# Patient Record
Sex: Female | Born: 1945
Health system: Southern US, Community
[De-identification: ages and names within clinical notes are randomized; demographics above are authoritative.]

## PROBLEM LIST (undated history)

## (undated) DIAGNOSIS — I2510 Atherosclerotic heart disease of native coronary artery without angina pectoris: Secondary | ICD-10-CM

## (undated) DIAGNOSIS — J45909 Unspecified asthma, uncomplicated: Secondary | ICD-10-CM

## (undated) DIAGNOSIS — Z8719 Personal history of other diseases of the digestive system: Secondary | ICD-10-CM

## (undated) DIAGNOSIS — I340 Nonrheumatic mitral (valve) insufficiency: Secondary | ICD-10-CM

## (undated) DIAGNOSIS — I5032 Chronic diastolic (congestive) heart failure: Secondary | ICD-10-CM

## (undated) DIAGNOSIS — R7303 Prediabetes: Secondary | ICD-10-CM

## (undated) DIAGNOSIS — Z9889 Other specified postprocedural states: Secondary | ICD-10-CM

## (undated) DIAGNOSIS — I499 Cardiac arrhythmia, unspecified: Secondary | ICD-10-CM

## (undated) DIAGNOSIS — R519 Headache, unspecified: Secondary | ICD-10-CM

## (undated) DIAGNOSIS — K219 Gastro-esophageal reflux disease without esophagitis: Secondary | ICD-10-CM

## (undated) DIAGNOSIS — E039 Hypothyroidism, unspecified: Secondary | ICD-10-CM

## (undated) DIAGNOSIS — Z950 Presence of cardiac pacemaker: Secondary | ICD-10-CM

## (undated) DIAGNOSIS — F32A Depression, unspecified: Secondary | ICD-10-CM

## (undated) DIAGNOSIS — I480 Paroxysmal atrial fibrillation: Secondary | ICD-10-CM

## (undated) DIAGNOSIS — I10 Essential (primary) hypertension: Secondary | ICD-10-CM

## (undated) DIAGNOSIS — I350 Nonrheumatic aortic (valve) stenosis: Secondary | ICD-10-CM

## (undated) DIAGNOSIS — E785 Hyperlipidemia, unspecified: Secondary | ICD-10-CM

## (undated) DIAGNOSIS — N189 Chronic kidney disease, unspecified: Secondary | ICD-10-CM

## (undated) DIAGNOSIS — R011 Cardiac murmur, unspecified: Secondary | ICD-10-CM

## (undated) DIAGNOSIS — M1990 Unspecified osteoarthritis, unspecified site: Secondary | ICD-10-CM

## (undated) DIAGNOSIS — R112 Nausea with vomiting, unspecified: Secondary | ICD-10-CM

## (undated) DIAGNOSIS — Z87442 Personal history of urinary calculi: Secondary | ICD-10-CM

## (undated) DIAGNOSIS — E0590 Thyrotoxicosis, unspecified without thyrotoxic crisis or storm: Secondary | ICD-10-CM

## (undated) DIAGNOSIS — F419 Anxiety disorder, unspecified: Secondary | ICD-10-CM

## (undated) DIAGNOSIS — G4730 Sleep apnea, unspecified: Secondary | ICD-10-CM

## (undated) DIAGNOSIS — D649 Anemia, unspecified: Secondary | ICD-10-CM

## (undated) DIAGNOSIS — J189 Pneumonia, unspecified organism: Secondary | ICD-10-CM

## (undated) DIAGNOSIS — T8859XA Other complications of anesthesia, initial encounter: Secondary | ICD-10-CM

## (undated) HISTORY — DX: Thyrotoxicosis, unspecified without thyrotoxic crisis or storm: E05.90

## (undated) HISTORY — DX: Paroxysmal atrial fibrillation: I48.0

## (undated) HISTORY — DX: Chronic kidney disease, unspecified: N18.9

## (undated) HISTORY — DX: Gastro-esophageal reflux disease without esophagitis: K21.9

## (undated) HISTORY — PX: CARDIAC CATHETERIZATION: SHX172

## (undated) HISTORY — DX: Cardiac murmur, unspecified: R01.1

## (undated) HISTORY — DX: Essential (primary) hypertension: I10

## (undated) HISTORY — PX: SHOULDER SURGERY: SHX246

## (undated) HISTORY — DX: Hyperlipidemia, unspecified: E78.5

## (undated) HISTORY — DX: Atherosclerotic heart disease of native coronary artery without angina pectoris: I25.10

---

## 1978-09-11 HISTORY — PX: ABDOMINAL HYSTERECTOMY: SHX81

## 1996-09-11 HISTORY — PX: VESICOVAGINAL FISTULA CLOSURE W/ TAH: SUR271

## 1999-09-12 HISTORY — PX: GALLBLADDER SURGERY: SHX652

## 1999-11-07 ENCOUNTER — Observation Stay (HOSPITAL_COMMUNITY): Admission: RE | Admit: 1999-11-07 | Discharge: 1999-11-08 | Payer: Self-pay | Admitting: *Deleted

## 1999-11-07 ENCOUNTER — Encounter (INDEPENDENT_AMBULATORY_CARE_PROVIDER_SITE_OTHER): Payer: Self-pay | Admitting: Specialist

## 2004-01-24 ENCOUNTER — Emergency Department (HOSPITAL_COMMUNITY): Admission: AD | Admit: 2004-01-24 | Discharge: 2004-01-24 | Payer: Self-pay | Admitting: *Deleted

## 2004-02-29 ENCOUNTER — Emergency Department (HOSPITAL_COMMUNITY): Admission: EM | Admit: 2004-02-29 | Discharge: 2004-02-29 | Payer: Self-pay | Admitting: Family Medicine

## 2004-09-30 ENCOUNTER — Ambulatory Visit: Payer: Self-pay | Admitting: Pulmonary Disease

## 2004-10-18 ENCOUNTER — Ambulatory Visit: Payer: Self-pay | Admitting: Pulmonary Disease

## 2005-04-18 ENCOUNTER — Ambulatory Visit (HOSPITAL_COMMUNITY): Admission: RE | Admit: 2005-04-18 | Discharge: 2005-04-18 | Payer: Self-pay | Admitting: Gastroenterology

## 2006-01-07 ENCOUNTER — Emergency Department (HOSPITAL_COMMUNITY): Admission: EM | Admit: 2006-01-07 | Discharge: 2006-01-07 | Payer: Self-pay | Admitting: Emergency Medicine

## 2006-10-02 ENCOUNTER — Emergency Department (HOSPITAL_COMMUNITY): Admission: EM | Admit: 2006-10-02 | Discharge: 2006-10-02 | Payer: Self-pay | Admitting: Emergency Medicine

## 2009-07-27 ENCOUNTER — Inpatient Hospital Stay (HOSPITAL_BASED_OUTPATIENT_CLINIC_OR_DEPARTMENT_OTHER): Admission: RE | Admit: 2009-07-27 | Discharge: 2009-07-27 | Payer: Self-pay | Admitting: Cardiovascular Disease

## 2009-07-28 ENCOUNTER — Observation Stay (HOSPITAL_COMMUNITY): Admission: RE | Admit: 2009-07-28 | Discharge: 2009-07-29 | Payer: Self-pay | Admitting: Cardiovascular Disease

## 2009-07-28 ENCOUNTER — Encounter: Payer: Self-pay | Admitting: Thoracic Surgery (Cardiothoracic Vascular Surgery)

## 2009-07-28 ENCOUNTER — Ambulatory Visit: Payer: Self-pay | Admitting: Thoracic Surgery (Cardiothoracic Vascular Surgery)

## 2009-07-29 ENCOUNTER — Encounter: Payer: Self-pay | Admitting: Thoracic Surgery (Cardiothoracic Vascular Surgery)

## 2009-08-02 ENCOUNTER — Ambulatory Visit: Payer: Self-pay | Admitting: Thoracic Surgery (Cardiothoracic Vascular Surgery)

## 2009-08-03 ENCOUNTER — Inpatient Hospital Stay (HOSPITAL_COMMUNITY)
Admission: RE | Admit: 2009-08-03 | Discharge: 2009-08-09 | Payer: Self-pay | Admitting: Thoracic Surgery (Cardiothoracic Vascular Surgery)

## 2009-08-03 HISTORY — PX: CORONARY ARTERY BYPASS GRAFT: SHX141

## 2009-08-30 ENCOUNTER — Ambulatory Visit: Payer: Self-pay | Admitting: Thoracic Surgery (Cardiothoracic Vascular Surgery)

## 2009-08-30 ENCOUNTER — Encounter
Admission: RE | Admit: 2009-08-30 | Discharge: 2009-08-30 | Payer: Self-pay | Admitting: Thoracic Surgery (Cardiothoracic Vascular Surgery)

## 2009-09-02 ENCOUNTER — Observation Stay (HOSPITAL_COMMUNITY): Admission: EM | Admit: 2009-09-02 | Discharge: 2009-09-03 | Payer: Self-pay | Admitting: Emergency Medicine

## 2009-09-13 ENCOUNTER — Encounter (HOSPITAL_COMMUNITY): Admission: RE | Admit: 2009-09-13 | Discharge: 2009-12-12 | Payer: Self-pay | Admitting: Cardiovascular Disease

## 2009-09-13 ENCOUNTER — Ambulatory Visit: Payer: Self-pay | Admitting: Thoracic Surgery (Cardiothoracic Vascular Surgery)

## 2009-09-13 ENCOUNTER — Encounter
Admission: RE | Admit: 2009-09-13 | Discharge: 2009-09-13 | Payer: Self-pay | Admitting: Thoracic Surgery (Cardiothoracic Vascular Surgery)

## 2009-10-07 ENCOUNTER — Ambulatory Visit: Payer: Self-pay | Admitting: Thoracic Surgery (Cardiothoracic Vascular Surgery)

## 2009-11-16 ENCOUNTER — Ambulatory Visit: Payer: Self-pay | Admitting: Thoracic Surgery (Cardiothoracic Vascular Surgery)

## 2009-11-16 ENCOUNTER — Encounter
Admission: RE | Admit: 2009-11-16 | Discharge: 2009-11-16 | Payer: Self-pay | Admitting: Thoracic Surgery (Cardiothoracic Vascular Surgery)

## 2010-09-20 ENCOUNTER — Encounter: Payer: Self-pay | Admitting: Cardiovascular Disease

## 2010-09-22 ENCOUNTER — Encounter
Admission: RE | Admit: 2010-09-22 | Discharge: 2010-09-22 | Payer: Self-pay | Source: Home / Self Care | Attending: Cardiovascular Disease | Admitting: Cardiovascular Disease

## 2010-09-23 ENCOUNTER — Ambulatory Visit: Payer: Self-pay | Admitting: Cardiovascular Disease

## 2010-09-26 ENCOUNTER — Ambulatory Visit: Admit: 2010-09-26 | Payer: Self-pay | Admitting: Thoracic Surgery (Cardiothoracic Vascular Surgery)

## 2010-10-02 ENCOUNTER — Encounter: Payer: Self-pay | Admitting: Thoracic Surgery (Cardiothoracic Vascular Surgery)

## 2010-12-12 LAB — CBC
HCT: 33.8 % — ABNORMAL LOW (ref 36.0–46.0)
Hemoglobin: 11.6 g/dL — ABNORMAL LOW (ref 12.0–15.0)
MCHC: 34.3 g/dL (ref 30.0–36.0)
MCV: 88.6 fL (ref 78.0–100.0)
Platelets: 222 10*3/uL (ref 150–400)
RBC: 3.81 MIL/uL — ABNORMAL LOW (ref 3.87–5.11)
RDW: 13.2 % (ref 11.5–15.5)
WBC: 9.6 10*3/uL (ref 4.0–10.5)

## 2010-12-12 LAB — URINE CULTURE: Colony Count: 75000

## 2010-12-12 LAB — COMPREHENSIVE METABOLIC PANEL WITH GFR
ALT: 24 U/L (ref 0–35)
AST: 24 U/L (ref 0–37)
Calcium: 8.7 mg/dL (ref 8.4–10.5)
Glucose, Bld: 98 mg/dL (ref 70–99)
Sodium: 137 meq/L (ref 135–145)
Total Protein: 6.9 g/dL (ref 6.0–8.3)

## 2010-12-12 LAB — URINALYSIS, ROUTINE W REFLEX MICROSCOPIC
Bilirubin Urine: NEGATIVE
Glucose, UA: NEGATIVE mg/dL
Hgb urine dipstick: NEGATIVE
Ketones, ur: 40 mg/dL — AB
Nitrite: NEGATIVE
Protein, ur: NEGATIVE mg/dL
Specific Gravity, Urine: 1.023 (ref 1.005–1.030)
Urobilinogen, UA: 0.2 mg/dL (ref 0.0–1.0)
pH: 5 (ref 5.0–8.0)

## 2010-12-12 LAB — URINE MICROSCOPIC-ADD ON

## 2010-12-12 LAB — DIFFERENTIAL
Basophils Absolute: 0 K/uL (ref 0.0–0.1)
Basophils Relative: 0 % (ref 0–1)
Eosinophils Absolute: 0 K/uL (ref 0.0–0.7)
Eosinophils Relative: 0 % (ref 0–5)
Lymphocytes Relative: 5 % — ABNORMAL LOW (ref 12–46)
Lymphs Abs: 0.5 K/uL — ABNORMAL LOW (ref 0.7–4.0)
Monocytes Absolute: 0.2 K/uL (ref 0.1–1.0)
Monocytes Relative: 2 % — ABNORMAL LOW (ref 3–12)
Neutro Abs: 8.9 K/uL — ABNORMAL HIGH (ref 1.7–7.7)
Neutrophils Relative %: 92 % — ABNORMAL HIGH (ref 43–77)

## 2010-12-12 LAB — COMPREHENSIVE METABOLIC PANEL
Albumin: 3.3 g/dL — ABNORMAL LOW (ref 3.5–5.2)
Alkaline Phosphatase: 60 U/L (ref 39–117)
BUN: 19 mg/dL (ref 6–23)
CO2: 25 mEq/L (ref 19–32)
Chloride: 105 mEq/L (ref 96–112)
Creatinine, Ser: 0.9 mg/dL (ref 0.4–1.2)
GFR calc Af Amer: >60 mL/min (ref >60)
GFR calc non Af Amer: >60 mL/min (ref >60)
Potassium: 4 mEq/L (ref 3.5–5.1)
Total Bilirubin: 0.6 mg/dL (ref 0.3–1.2)

## 2010-12-14 LAB — URINALYSIS, MICROSCOPIC ONLY
Bilirubin Urine: NEGATIVE
Leukocytes, UA: NEGATIVE
Nitrite: NEGATIVE
Protein, ur: NEGATIVE mg/dL
Specific Gravity, Urine: 1.012 (ref 1.005–1.030)

## 2010-12-14 LAB — POCT I-STAT 3, ART BLOOD GAS (G3+)
Acid-base deficit: 1 mmol/L (ref 0.0–2.0)
Acid-base deficit: 3 mmol/L — ABNORMAL HIGH (ref 0.0–2.0)
Bicarbonate: 20.5 mEq/L (ref 20.0–24.0)
Bicarbonate: 20.8 mEq/L (ref 20.0–24.0)
O2 Saturation: 96 %
Patient temperature: 33.9
Patient temperature: 35
Patient temperature: 36.9
TCO2: 21 mmol/L (ref 0–100)
TCO2: 22 mmol/L (ref 0–100)
pCO2 arterial: 35 mmHg (ref 35.0–45.0)
pH, Arterial: 7.382 (ref 7.350–7.400)
pO2, Arterial: 461 mmHg — ABNORMAL HIGH (ref 80.0–100.0)
pO2, Arterial: 62 mmHg — ABNORMAL LOW (ref 80.0–100.0)

## 2010-12-14 LAB — POCT I-STAT 4, (NA,K, GLUC, HGB,HCT)
Glucose, Bld: 91 mg/dL (ref 70–99)
Glucose, Bld: 93 mg/dL (ref 70–99)
HCT: 31 % — ABNORMAL LOW (ref 36.0–46.0)
HCT: 34 % — ABNORMAL LOW (ref 36.0–46.0)
Hemoglobin: 10.5 g/dL — ABNORMAL LOW (ref 12.0–15.0)
Potassium: 3.7 mEq/L (ref 3.5–5.1)
Potassium: 3.9 mEq/L (ref 3.5–5.1)
Potassium: 4 mEq/L (ref 3.5–5.1)
Sodium: 136 mEq/L (ref 135–145)
Sodium: 138 mEq/L (ref 135–145)
Sodium: 138 mEq/L (ref 135–145)

## 2010-12-14 LAB — POCT I-STAT, CHEM 8
BUN: 17 mg/dL (ref 6–23)
Creatinine, Ser: 0.8 mg/dL (ref 0.4–1.2)
Hemoglobin: 12.6 g/dL (ref 12.0–15.0)
Potassium: 4.8 mEq/L (ref 3.5–5.1)
Sodium: 136 mEq/L (ref 135–145)
TCO2: 20 mmol/L (ref 0–100)

## 2010-12-14 LAB — URINALYSIS, ROUTINE W REFLEX MICROSCOPIC
Glucose, UA: NEGATIVE mg/dL
Hgb urine dipstick: NEGATIVE
Ketones, ur: NEGATIVE mg/dL
Protein, ur: NEGATIVE mg/dL

## 2010-12-14 LAB — CBC
HCT: 29.9 % — ABNORMAL LOW (ref 36.0–46.0)
HCT: 30.9 % — ABNORMAL LOW (ref 36.0–46.0)
HCT: 32.5 % — ABNORMAL LOW (ref 36.0–46.0)
HCT: 33.1 % — ABNORMAL LOW (ref 36.0–46.0)
HCT: 38.3 % (ref 36.0–46.0)
HCT: 44.1 % (ref 36.0–46.0)
Hemoglobin: 10.4 g/dL — ABNORMAL LOW (ref 12.0–15.0)
Hemoglobin: 11.1 g/dL — ABNORMAL LOW (ref 12.0–15.0)
Hemoglobin: 11.4 g/dL — ABNORMAL LOW (ref 12.0–15.0)
Hemoglobin: 12.2 g/dL (ref 12.0–15.0)
MCHC: 34.1 g/dL (ref 30.0–36.0)
MCHC: 34.5 g/dL (ref 30.0–36.0)
MCHC: 34.9 g/dL (ref 30.0–36.0)
MCHC: 35 g/dL (ref 30.0–36.0)
MCV: 87.9 fL (ref 78.0–100.0)
MCV: 88.1 fL (ref 78.0–100.0)
MCV: 88.9 fL (ref 78.0–100.0)
MCV: 89.6 fL (ref 78.0–100.0)
MCV: 90.3 fL (ref 78.0–100.0)
MCV: 90.4 fL (ref 78.0–100.0)
Platelets: 147 10*3/uL — ABNORMAL LOW (ref 150–400)
Platelets: 155 10*3/uL (ref 150–400)
Platelets: 268 10*3/uL (ref 150–400)
RBC: 3.44 MIL/uL — ABNORMAL LOW (ref 3.87–5.11)
RBC: 3.95 MIL/uL (ref 3.87–5.11)
RBC: 4.26 MIL/uL (ref 3.87–5.11)
RDW: 12.9 % (ref 11.5–15.5)
RDW: 12.9 % (ref 11.5–15.5)
RDW: 12.9 % (ref 11.5–15.5)
RDW: 13.2 % (ref 11.5–15.5)
RDW: 13.4 % (ref 11.5–15.5)
RDW: 13.4 % (ref 11.5–15.5)
WBC: 21.2 10*3/uL — ABNORMAL HIGH (ref 4.0–10.5)
WBC: 8.4 10*3/uL (ref 4.0–10.5)
WBC: 9.6 10*3/uL (ref 4.0–10.5)
WBC: 9.7 10*3/uL (ref 4.0–10.5)

## 2010-12-14 LAB — BASIC METABOLIC PANEL
BUN: 17 mg/dL (ref 6–23)
CO2: 21 mEq/L (ref 19–32)
CO2: 23 mEq/L (ref 19–32)
Calcium: 8.1 mg/dL — ABNORMAL LOW (ref 8.4–10.5)
Calcium: 8.4 mg/dL (ref 8.4–10.5)
Chloride: 104 mEq/L (ref 96–112)
Chloride: 106 mEq/L (ref 96–112)
Chloride: 109 mEq/L (ref 96–112)
Creatinine, Ser: 0.82 mg/dL (ref 0.4–1.2)
Creatinine, Ser: 0.97 mg/dL (ref 0.4–1.2)
GFR calc non Af Amer: 52 mL/min — ABNORMAL LOW (ref >60)
Glucose, Bld: 116 mg/dL — ABNORMAL HIGH (ref 70–99)
Glucose, Bld: 84 mg/dL (ref 70–99)
Glucose, Bld: 98 mg/dL (ref 70–99)
Potassium: 3.6 mEq/L (ref 3.5–5.1)
Potassium: 4.3 mEq/L (ref 3.5–5.1)
Potassium: 4.7 mEq/L (ref 3.5–5.1)
Sodium: 132 mEq/L — ABNORMAL LOW (ref 135–145)
Sodium: 139 mEq/L (ref 135–145)
Sodium: 139 mEq/L (ref 135–145)

## 2010-12-14 LAB — GLUCOSE, CAPILLARY
Glucose-Capillary: 104 mg/dL — ABNORMAL HIGH (ref 70–99)
Glucose-Capillary: 109 mg/dL — ABNORMAL HIGH (ref 70–99)
Glucose-Capillary: 117 mg/dL — ABNORMAL HIGH (ref 70–99)
Glucose-Capillary: 90 mg/dL (ref 70–99)

## 2010-12-14 LAB — BLOOD GAS, ARTERIAL
Acid-base deficit: 0.7 mmol/L (ref 0.0–2.0)
Drawn by: 317771
O2 Saturation: 95.6 %
Patient temperature: 98.6
pH, Arterial: 7.406 — ABNORMAL HIGH (ref 7.350–7.400)

## 2010-12-14 LAB — LIPID PANEL
HDL: 48 mg/dL (ref >39)
Total CHOL/HDL Ratio: 2.8 RATIO
VLDL: 8 mg/dL (ref 0–40)

## 2010-12-14 LAB — PROTIME-INR
INR: 1.12 (ref 0.00–1.49)
INR: 1.4 (ref 0.00–1.49)
Prothrombin Time: 17 seconds — ABNORMAL HIGH (ref 11.6–15.2)

## 2010-12-14 LAB — COMPREHENSIVE METABOLIC PANEL
ALT: 43 U/L — ABNORMAL HIGH (ref 0–35)
AST: 36 U/L (ref 0–37)
CO2: 29 mEq/L (ref 19–32)
Chloride: 103 mEq/L (ref 96–112)
GFR calc Af Amer: >60 mL/min (ref >60)
GFR calc non Af Amer: 57 mL/min — ABNORMAL LOW (ref >60)
Potassium: 4.9 mEq/L (ref 3.5–5.1)
Sodium: 138 mEq/L (ref 135–145)
Total Bilirubin: 0.7 mg/dL (ref 0.3–1.2)

## 2010-12-14 LAB — APTT
aPTT: 25 seconds (ref 24–37)
aPTT: 27 seconds (ref 24–37)

## 2010-12-14 LAB — CREATININE, SERUM
GFR calc Af Amer: >60 mL/min (ref >60)
GFR calc non Af Amer: 52 mL/min — ABNORMAL LOW (ref >60)

## 2010-12-14 LAB — MAGNESIUM
Magnesium: 2.4 mg/dL (ref 1.5–2.5)
Magnesium: 2.7 mg/dL — ABNORMAL HIGH (ref 1.5–2.5)

## 2010-12-14 LAB — ABO/RH: ABO/RH(D): A POS

## 2010-12-26 ENCOUNTER — Other Ambulatory Visit: Payer: Self-pay | Admitting: *Deleted

## 2010-12-26 DIAGNOSIS — I10 Essential (primary) hypertension: Secondary | ICD-10-CM

## 2010-12-26 MED ORDER — AMLODIPINE BESYLATE 5 MG PO TABS: 5.0000 mg | ORAL_TABLET | Freq: Every day | ORAL | Status: DC

## 2010-12-29 ENCOUNTER — Encounter: Payer: Self-pay | Admitting: *Deleted

## 2010-12-29 DIAGNOSIS — R011 Cardiac murmur, unspecified: Secondary | ICD-10-CM

## 2010-12-29 DIAGNOSIS — I10 Essential (primary) hypertension: Secondary | ICD-10-CM | POA: Insufficient documentation

## 2010-12-29 DIAGNOSIS — E785 Hyperlipidemia, unspecified: Secondary | ICD-10-CM | POA: Insufficient documentation

## 2010-12-29 DIAGNOSIS — I2510 Atherosclerotic heart disease of native coronary artery without angina pectoris: Secondary | ICD-10-CM | POA: Insufficient documentation

## 2010-12-29 DIAGNOSIS — I25119 Atherosclerotic heart disease of native coronary artery with unspecified angina pectoris: Secondary | ICD-10-CM | POA: Insufficient documentation

## 2010-12-30 ENCOUNTER — Encounter: Payer: Self-pay | Admitting: Cardiovascular Disease

## 2010-12-30 ENCOUNTER — Ambulatory Visit (INDEPENDENT_AMBULATORY_CARE_PROVIDER_SITE_OTHER): Payer: PRIVATE HEALTH INSURANCE | Admitting: Cardiovascular Disease

## 2010-12-30 DIAGNOSIS — E785 Hyperlipidemia, unspecified: Secondary | ICD-10-CM

## 2010-12-30 DIAGNOSIS — I2510 Atherosclerotic heart disease of native coronary artery without angina pectoris: Secondary | ICD-10-CM

## 2010-12-30 MED ORDER — ATORVASTATIN CALCIUM 40 MG PO TABS: 40.0000 mg | ORAL_TABLET | Freq: Every day | ORAL | Status: DC

## 2010-12-30 NOTE — Assessment & Plan Note (Signed)
Samantha Short presents for followup of her coronary artery disease. She has lots of a complaint such as some vague chest pain and a shortness breath. These episodes of chest pain June not so much her presenting episodes of angina. She refused to have an EKG today. I've asked her to continue with the diet and exercise program.

## 2010-12-30 NOTE — Patient Instructions (Signed)
Lipid profile and liver enzymes in 3 months. OV in 6 months.

## 2010-12-30 NOTE — Assessment & Plan Note (Signed)
The patient is to Crestor the past but did not tolerate it. She has a history of premature coronary artery disease and still has an elevated LDL. I've encouraged her to try Lipitor 40 mg a day. We'll start that today I'll check a lipid profile, basic metabolic profile, and hepatic profile in 3 months.

## 2010-12-30 NOTE — Progress Notes (Signed)
Samantha Short Date of Birth  Nov 18, 1945 [facility] [address]     [address] [place], [place]  [address] [phone]  Fax  [phone]  History of Present Illness:  Samantha Short presents today for followup of her coronary artery disease.  She's had some vague episodes of chest pain although they are not similar to her presenting episodes of angina years ago. Has shortness breath everyday. She denies any syncope or presyncope.    Current Outpatient Prescriptions on File Prior to Visit  Medication Sig Dispense Refill  . amLODipine (NORVASC) 5 MG tablet Take 1 tablet (5 mg total) by mouth daily.  90 tablet  3  . aspirin 325 MG tablet Take 325 mg by mouth daily.        Marland Kitchen lisinopril (PRINIVIL,ZESTRIL) 10 MG tablet Take 10 mg by mouth daily.        . metoprolol (LOPRESSOR) 50 MG tablet Take 75 mg by mouth. 1 & 1/2 tablet 2 (two) times daily         Allergies  Allergen Reactions  . Ceclor (Cefaclor)   . Crestor (Rosuvastatin Calcium)   . Erythromycin   . Penicillins   . Sulfur   . Tetracyclines & Related     Past Medical History  Diagnosis Date  . Coronary artery disease   . Hyperlipidemia   . Hypertension   . Heart murmur     very soft heart murmur  . History of pneumonia     x3  . Acid reflux   . History of shortness of breath   . History of rheumatic fever     was pregnant with 1st child / child died at birth      . Dyslipidemia   . History of echocardiogram 07/29/2009    Past Surgical History  Procedure Date  . Cardiac catheterization 07/27/09 & 07/28/09  . Cesarean section   . Cesarean section   . Vesicovaginal fistula closure w/ tah 1998  . Gallbladder surgery 2001  . Shoulder surgery 1998 / 2001    from accident  . Coronary artery bypass graft 08/03/2009    x1 using left internal mammary artery to distal left anterior  descending coronary artery.     History  Smoking status  . Former Smoker -- 7 years  . Types:  Cigarettes  . Quit date: 07/12/2010  Smokeless tobacco  . Never Used    History  Alcohol Use No    Family History  Problem Relation Age of Onset  . Cancer Father   . Heart attack Father   . Heart attack Mother   . Heart disease Mother     had pacemaker  . Cancer Brother   . Heart disease Brother   . Heart disease Sister   . Cancer Sister   . Angina Sister   . Coronary artery disease Son   . Hypertension Sister   . Hypertension Brother     Reviw of Systems:  Reviewed in the HPI.  All other systems are negative.  Physical Exam: BP 148/82  Pulse 72  Ht 5\' 4"  (1.626 m)  Wt 204 lb 9.6 oz (92.806 kg)  BMI 35.12 kg/m2 The patient is alert and oriented x 3.  The mood and affect are normal.  The skin is warm and dry.  Color is normal.  The HEENT exam reveals that the sclera are nonicteric.  The mucous membranes are moist.  The carotids are 2+ without bruits.  There is no thyromegaly.  There is no JVD.  The lungs are clear.  The chest wall is non tender.  The heart exam reveals a regular rate with a normal S1 and S2.  There are no murmurs, gallops, or rubs.  The PMI is not displaced.   Abdominal exam reveals good bowel sounds.  There is no guarding or rebound.  There is no hepatosplenomegaly or tenderness.  There are no masses.  Exam of the legs reveal no clubbing, cyanosis, or edema.  The legs are without rashes.  The distal pulses are intact.  Cranial nerves II - XII are intact.  Motor and sensory functions are intact.  The gait is normal.  ECG:  Assessment / Plan:

## 2011-01-24 NOTE — Assessment & Plan Note (Signed)
OFFICE VISIT   Samantha Short, Samantha Short  DOB:  Jan 09, 1946                                        August 30, 2009  CHART #:  [id number]   REASON FOR OFFICE VISIT:  Routine followup status post CABG x1, off-  pump.   HISTORY OF PRESENT ILLNESS:  This is a 65 year old Caucasian female with  a past medical history of hypertension and tobacco abuse who had a  Cardiolite stress test that was abnormal.  She underwent an elective  cardiac catheterization on July 28, 2009 by Dr. Elease Hashimoto.  She was  found to have a 90% stenosis of the mid LAD with normal left ventricular  function.  She had been given a loading dose of Plavix and was brought  back to the cardiac catheterization lab for a possible percutaneous  coronary intervention.  However, left main coronary artery cannot be  safely engaged and the procedure was boarded.  Because the patient had  received the full loading dose of Plavix, it was decided the patient  will be discharged on July 29, 2009.  She then re-presented to [facility] on August 03, 2009 to undergo off-pump CABG x1 (LIMA to LAD) by  Dr. Cornelius Moras.   Since having been discharged from the hospital, her complaints include  continued hoarseness (which she did have postoperatively) as well as  some constipation.  It should be noted that postoperatively the patient  did have a Speech consult and she was encouraged to continue with her  mechanical soft and liquid diet.  She had no signs of aspiration or  difficulty swallowing postoperatively.  She states that she has a  decreased appetite and has had 1 or 2 occasions with difficulty of  swallowing her pills.  She denies any chest pain, shortness of breath,  fever, or chills.   PHYSICAL EXAMINATION:  General:  This is a 65 year old Caucasian female  who is in no acute distress, who is alert, oriented, and cooperative.  Vital Signs:  BP is 106/74, pulse rate 74, respirations 18, and O2 sat  96% on  room air.  Cardiovascular:  Regular rate and rhythm.  S1 and S2  without any murmurs, gallops, or rubs.  Pulmonary:  Slightly diminished  at the bases bilaterally.  No rales, wheezes, or rhonchi.  Abdomen:  Soft and nontender.  Bowel sounds present.  Extremities:  No cyanosis or  clubbing.  Trace lower extremity edema bilaterally. Lungs:  Sternal  wound is clean and dry.  There are 2 scabs present on her former chest  tube sites.  These were removed.  Bandages were  placed.   Chest x-ray done today showed mild cardiac enlargement, calcified  nodules in both lungs with calcified hilar and right paratracheal  adenopathy (compatible with old granulomatous disease), minimal  bibasilar atelectasis, tiny bilateral pleural effusions, no  pneumothorax.   IMPRESSION AND PLAN:  Overall, the patient is surgically stable.  She  does however continue to have persistent hoarseness.  The patient also  stated that she does have a history of laryngitis as well.  She also  states that it is hard for her to cough sometimes as well.  I did give  her the option of following up with an ENT physician, which she will  consider.  Regarding the patient's constipation, she was encouraged to  use over-the-counter medications, i.e., MiraLax.  The patient denies any  abdominal pain, nauseousness, or emesis.  She was instructed if any of  these develop or she notices blood in her stool, she needs to contact  her medical doctor immediately.  The patient is instructed she may begin  driving short distances.  She was encouraged to begin cardiac rehab.  She was also reminded to continue with sternal precautions for at least  4-6 more weeks (i.e., no lifting more than 8 pounds).  Regarding smoking  cessation, the patient states she quit cold [place] and has not had any  tobacco products since being discharged from the hospital.  The patient  has already followed up with Dr. Elease Hashimoto and will continue to be followed  by him  closely.  The patient wishes to see Dr. Cornelius Moras in followup.  She  will return to the office in 2 weeks and a chest x-ray will be obtained  prior to this appointment.  Also regarding the patient's decreased  appetite, the patient was encouraged to try protein shakes or things  that would help stimulate her appetite and hopefully this should improve  over the next couple of weeks.  The patient was instructed to call for  any problems, questions, or concerns in the interim until her next  appointment.   Salvatore Decent. Cornelius Moras, M.D.  Electronically Signed   DZ/MEDQ  D:  08/30/2009  T:  08/31/2009  Job:  [id number]   cc:   Samantha Short, M.D.

## 2011-01-24 NOTE — Assessment & Plan Note (Signed)
OFFICE VISIT   Samantha Short, Samantha Short  DOB:  14-Apr-1946                                        November 16, 2009  CHART #:  [id number]   HISTORY:  The patient returns to the office today for evaluation of left  chest wall pain and tenderness.  She originally underwent off-pump  coronary artery bypass grafting x1 using left internal mammary artery  graft on August 03, 2009.  Postoperatively, she has done well.  However, ever since surgery, she has had numbness and tenderness along  the left anterior chest wall, consistent with symptoms related to left  internal mammary artery harvest and grafting.  She has otherwise done  well.  Because of this continued pain, she has returned to the office  for further evaluation.  She states that she had been participating in  the cardiac rehab program and her exercise tolerance has continued to  improve.  She does not have any exertional chest pain or shortness of  breath.  She does not have any sensation of clicking or motion of the  sternum.  The remainder of her review of systems is entirely  unremarkable.   PHYSICAL EXAMINATION:  Her sternal incision has healed completely and  the sternum is stable on palpation.  Auscultation of the chest  demonstrates clear breath sounds.  The remainder of her physical exam is  unremarkable.   IMPRESSION:  I have reassured the patient that numbness and neuropathic  pain along the left anterior chest wall are not uncommon after coronary  artery bypass grafting when the left internal mammary artery is utilized  for surgery.  These symptoms can be slow to resolve and typically  symptoms progress in phases.  I have cautioned her that she may always  have some numbness and/or some discomfort in this area, but with time  the pain should subside.  We have discussed a variety of strategies for  management of the pain in the short term and all of her questions have  been addressed.  Ultimately if  she continues to have severe pain, we  could consider use of Neurontin for treatment of neuropathic pain.  She  is reluctant to try any sort of prescription  medications at this time and the symptoms do not seem to be that severe.  In the future, she will call or return to see [place] as needed.  All of her  questions have been addressed.   Salvatore Decent. Cornelius Moras, M.D.  Electronically Signed   CHO/MEDQ  D:  11/16/2009  T:  11/17/2009  Job:  [id number]   cc:   Vesta Mixer, M.D.  Ronnie Derby, PA

## 2011-01-24 NOTE — Assessment & Plan Note (Signed)
OFFICE VISIT   RAYMONDE, HAMBLIN  DOB:  02-08-1946                                        August 02, 2009  CHART #:  [id number]   The patient presents to the office today for further follow up with  tentative plans to proceed with elective coronary artery bypass grafting  tomorrow.  She was originally seen in consultation on July 28, 2009,  a full consultation report and history and physical exam was dictated at  that time.  Since then, the patient has remained clinically stable.  She  states that she has still had some mild pain in her chest, but this has  not been severe enough that she has not taken any sublingual  nitroglycerin.  She otherwise feels the same and she reports no new  problems or complaints.  She denies any productive cough.  She denies  any fevers.  She thinks she may have had a slight chill last night.  She  has no difficulty urinating.  Her bowel function is regular.  The  remainder of her review of systems is unchanged from previously.  I  again reviewed the indications, risks, and potential benefits of surgery  with the patient here in the office today.  All of her questions have  been addressed.  We plan to proceed with surgery first case tomorrow  morning.   Salvatore Decent. Cornelius Moras, M.D.  Electronically Signed   CHO/MEDQ  D:  08/02/2009  T:  08/02/2009  Job:  [id number]

## 2011-01-24 NOTE — Assessment & Plan Note (Signed)
OFFICE VISIT   AZIZA, STUCKERT  DOB:  16-Jun-1946                                        September 13, 2009  CHART #:  [id number]   The patient returns for further followup status post off-pump coronary  artery bypass grafting x1 on August 03, 2009.  She was last seen here  in the office on August 30, 2009 by one of our physician assistants.  Since then, she has continued to do well.  She states that she did go to  the emergency room at one point due to symptoms of dehydration.  After  she was rehydrated, she felt better.  Despite this, she remains on an  oral diuretic agent, treatment of her hypertension.  She otherwise feels  fine.  She has significant sensitivity and discomfort along the left  anterior chest wall consistent with recent left internal mammary artery  harvest and grafting at the time of her surgery.  She denies any  sensation of sternal instability and she otherwise feels fine.  She is  not having shortness of breath.  Her appetite is good.  She has no other  complaints.   PHYSICAL EXAMINATION:  Notable for a well-appearing female with blood  pressure 109/70, pulse 75, and oxygen saturation 97% on room air.  Examination of the chest reveals a median sternotomy scar that is  healing very nicely and the sternum is stable on palpation.  Breath  sounds are clear to auscultation and symmetrical bilaterally.  Cardiovascular exam demonstrates regular rate and rhythm.  No murmurs,  rubs, or gallops are noted.  The abdomen is soft and nontender.  The  extremities are warm and well perfused.  There is no lower extremity  edema.   DIAGNOSTIC TESTS:  Chest x-ray performed today at the [facility] is reviewed.  This demonstrates clear lung fields bilaterally.  There are no pleural effusions.  All of the sternal wires appear intact.   IMPRESSION:  The patient is doing well overall.  Her voice is still a  bit hoarse, but she notes that  this is improving.  She has mild residual  soreness in her chest.  She did have to go to the emergency room for an  episode of dehydration, and at this point a question whether or not she  should remain on oral diuretic agent.   PLAN:  I have suggested that the patient try stopping  hydrochlorothiazide and potassium.  She is scheduled to see Dr. Elease Hashimoto  for further followup in 2 weeks.  If her blood pressure remains under  good control without the diuretic agent, she might be better off under  the circumstances.  If not, perhaps she could go back on a smaller dose.  All of her questions have been addressed.  I have encouraged to continue  to participate in a cardiac rehab program.  I have reminded her to avoid  any heavy lifting or strenuous use of her arms or shoulders.  In the  future, she will call or return to see [place] as needed.   Salvatore Decent. Cornelius Moras, M.D.  Electronically Signed   CHO/MEDQ  D:  09/13/2009  T:  09/14/2009  Job:  [id number]   cc:   Vesta Mixer, M.D.  Brandon Melnick, PA-C

## 2011-02-02 ENCOUNTER — Encounter: Payer: Self-pay | Admitting: Cardiovascular Disease

## 2011-02-16 ENCOUNTER — Encounter: Payer: Self-pay | Admitting: Cardiovascular Disease

## 2011-02-16 ENCOUNTER — Ambulatory Visit (INDEPENDENT_AMBULATORY_CARE_PROVIDER_SITE_OTHER): Payer: BC Managed Care – PPO | Admitting: Cardiovascular Disease

## 2011-02-16 VITALS — BP 134/80 | HR 60 | Ht 64.0 in | Wt 265.0 lb

## 2011-02-16 DIAGNOSIS — I2510 Atherosclerotic heart disease of native coronary artery without angina pectoris: Secondary | ICD-10-CM

## 2011-02-16 NOTE — Progress Notes (Signed)
History of Present Illness:65 yo WF with history of CAD s/p 1V CABG 2010, HTN, hyperlipidemia, GERD here today for a second opinion on her CAD. She has been followed by Dr. Delane Ginger and was most recently seen by Dr. Elease Hashimoto in April 2012. Her cardiac history dates back to November 2010 at which time she had CP, abnormal myoview and a cardiac cath that led to a single vessel CABG November 2010, LIMA to LAD. Echo November 2010 with normal LV function. No significant valvular abnormalities. She has not been able to tolerate statins in the past. She has felt that her strength is better off of statins.   She tells me that she has been feeling well. No chest pain. She has mild dyspnea on exertion.  No palpitations, near syncope or syncope. She has started a weight loss program and is exercising.   Her lipids are followed in primary care by Georgette Shell, PA in [place].    Past Medical History  Diagnosis Date  . Coronary artery disease   . Hyperlipidemia   . Hypertension   . Heart murmur     very soft heart murmur  . History of pneumonia     x3  . Acid reflux   . History of shortness of breath   . History of rheumatic fever     was pregnant with 1st child / child died at birth      . Dyslipidemia   . History of echocardiogram 07/29/2009    Past Surgical History  Procedure Date  . Cardiac catheterization 07/27/09 & 07/28/09  . Cesarean section   . Cesarean section   . Vesicovaginal fistula closure w/ tah 1998  . Gallbladder surgery 2001  . Shoulder surgery 1998 / 2001    from accident  . Coronary artery bypass graft 08/03/2009    x1 using left internal mammary artery to distal left anterior  descending coronary artery.     Current Outpatient Prescriptions  Medication Sig Dispense Refill  . amLODipine (NORVASC) 5 MG tablet Take 1 tablet (5 mg total) by mouth daily.  90 tablet  3  . aspirin 325 MG tablet Take 325 mg by mouth daily.        Marland Kitchen lisinopril (PRINIVIL,ZESTRIL) 10 MG tablet  Take 10 mg by mouth daily.        . metoprolol (LOPRESSOR) 50 MG tablet Take 75 mg by mouth. 1 & 1/2 tablet 2 (two) times daily       . Omega-3 Fatty Acids (FISH OIL) 300 MG CAPS Take 2 capsules by mouth daily.       . vitamin B-12 (CYANOCOBALAMIN) 1000 MCG tablet Take 1,000 mcg by mouth daily.        Marland Kitchen DISCONTD: atorvastatin (LIPITOR) 40 MG tablet Take 1 tablet (40 mg total) by mouth daily.  30 tablet  11    Allergies  Allergen Reactions  . Ceclor (Cefaclor)   . Crestor (Rosuvastatin Calcium)   . Erythromycin   . Lipitor (Atorvastatin Calcium)   . Penicillins   . Sulfur   . Tetracyclines & Related     History   Social History  . Marital Status: Married    Spouse Name: N/A    Number of Children: N/A  . Years of Education: N/A   Occupational History  . Not on file.   Social History Main Topics  . Smoking status: Former Smoker -- 7 years    Types: Cigarettes    Quit date: 07/12/2010  . Smokeless  tobacco: Never Used  . Alcohol Use: No  . Drug Use: No  . Sexually Active: Not on file   Other Topics Concern  . Not on file   Social History Narrative  . No narrative on file    Family History  Problem Relation Age of Onset  . Cancer Father   . Heart attack Father   . Heart attack Mother   . Heart disease Mother     had pacemaker  . Cancer Brother   . Heart disease Brother   . Heart disease Sister   . Cancer Sister   . Angina Sister   . Coronary artery disease Son   . Hypertension Sister   . Hypertension Brother     Review of Systems:  As stated in the HPI and otherwise negative.   BP 134/80  Pulse 60  Ht 5\' 4"  (1.626 m)  Wt 265 lb (120.203 kg)  BMI 45.49 kg/m2  Physical Examination: General: Well developed, well nourished, NAD HEENT: OP clear, mucus membranes moist SKIN: warm, dry. No rashes. Neuro: No focal deficits Musculoskeletal: Muscle strength 5/5 all ext Psychiatric: Mood and affect normal Neck: No JVD, no carotid bruits, no thyromegaly, no  lymphadenopathy. Lungs:Clear bilaterally, no wheezes, rhonci, crackles Cardiovascular: Regular rate and rhythm. No murmurs, gallops or rubs. Abdomen:Soft. Bowel sounds present. Non-tender.  Extremities: No lower extremity edema. Pulses are 2 + in the bilateral DP/PT.  EKG:NSR, rate 60 bpm. Incomplete

## 2011-02-16 NOTE — Assessment & Plan Note (Signed)
Stable. No changes. Will get echo to assess LV function with dyspnea. No assessment since her bypass surgery.

## 2011-02-16 NOTE — Patient Instructions (Signed)
Your physician recommends that you schedule a follow-up appointment in: 6 months  Your physician has requested that you have an echocardiogram. Echocardiography is a painless test that uses sound waves to create images of your heart. It provides your doctor with information about the size and shape of your heart and how well your heart's chambers and valves are working. This procedure takes approximately one hour. There are no restrictions for this procedure.    

## 2011-02-20 ENCOUNTER — Encounter: Payer: Self-pay | Admitting: *Deleted

## 2011-02-27 ENCOUNTER — Ambulatory Visit (HOSPITAL_COMMUNITY): Payer: BC Managed Care – PPO | Attending: Cardiovascular Disease | Admitting: Radiology

## 2011-02-27 DIAGNOSIS — E785 Hyperlipidemia, unspecified: Secondary | ICD-10-CM | POA: Insufficient documentation

## 2011-02-27 DIAGNOSIS — I2510 Atherosclerotic heart disease of native coronary artery without angina pectoris: Secondary | ICD-10-CM

## 2011-02-27 DIAGNOSIS — I079 Rheumatic tricuspid valve disease, unspecified: Secondary | ICD-10-CM | POA: Insufficient documentation

## 2011-02-27 DIAGNOSIS — R0609 Other forms of dyspnea: Secondary | ICD-10-CM | POA: Insufficient documentation

## 2011-02-27 DIAGNOSIS — I059 Rheumatic mitral valve disease, unspecified: Secondary | ICD-10-CM | POA: Insufficient documentation

## 2011-02-27 DIAGNOSIS — R0989 Other specified symptoms and signs involving the circulatory and respiratory systems: Secondary | ICD-10-CM | POA: Insufficient documentation

## 2011-02-27 DIAGNOSIS — I10 Essential (primary) hypertension: Secondary | ICD-10-CM | POA: Insufficient documentation

## 2011-03-01 ENCOUNTER — Telehealth: Payer: Self-pay | Admitting: Cardiovascular Disease

## 2011-03-01 NOTE — Telephone Encounter (Signed)
LM Informing patient that I would call her once results have been reviewed.

## 2011-03-01 NOTE — Telephone Encounter (Signed)
Echo results from monday

## 2011-04-25 ENCOUNTER — Other Ambulatory Visit: Payer: Self-pay | Admitting: *Deleted

## 2011-04-25 DIAGNOSIS — I10 Essential (primary) hypertension: Secondary | ICD-10-CM

## 2011-04-25 MED ORDER — AMLODIPINE BESYLATE 5 MG PO TABS: 5.0000 mg | ORAL_TABLET | Freq: Every day | ORAL | Status: DC

## 2011-07-28 ENCOUNTER — Ambulatory Visit (INDEPENDENT_AMBULATORY_CARE_PROVIDER_SITE_OTHER): Payer: Medicare Other | Admitting: Cardiovascular Disease

## 2011-07-28 ENCOUNTER — Encounter: Payer: Self-pay | Admitting: Cardiovascular Disease

## 2011-07-28 VITALS — BP 142/76 | HR 51 | Ht 62.0 in | Wt 199.0 lb

## 2011-07-28 DIAGNOSIS — I2510 Atherosclerotic heart disease of native coronary artery without angina pectoris: Secondary | ICD-10-CM

## 2011-07-28 DIAGNOSIS — I10 Essential (primary) hypertension: Secondary | ICD-10-CM

## 2011-07-28 LAB — BASIC METABOLIC PANEL
Chloride: 108 mEq/L (ref 96–112)
Creatinine, Ser: 0.9 mg/dL (ref 0.4–1.2)
Potassium: 4.6 mEq/L (ref 3.5–5.1)

## 2011-07-28 LAB — LIPID PANEL
Cholesterol: 185 mg/dL (ref 0–200)
LDL Cholesterol: 110 mg/dL — ABNORMAL HIGH (ref 0–99)
Triglycerides: 44 mg/dL (ref 0.0–149.0)

## 2011-07-28 MED ORDER — NITROGLYCERIN 0.4 MG SL SUBL: 0.4000 mg | SUBLINGUAL_TABLET | SUBLINGUAL | Status: DC | PRN

## 2011-07-28 MED ORDER — AMLODIPINE BESYLATE 10 MG PO TABS: 10.0000 mg | ORAL_TABLET | Freq: Every day | ORAL | Status: DC

## 2011-07-28 NOTE — Patient Instructions (Addendum)
Your physician wants you to follow-up in: 12 months. You will receive a reminder letter in the mail two months in advance. If you don't receive a letter, please call our office to schedule the follow-up appointment.  Your physician has recommended you make the following change in your medication: Increase amlodipine to 10 mg by mouth daily. Call us when you want this prescription sent in to your pharmacy.

## 2011-07-28 NOTE — Assessment & Plan Note (Signed)
BP is elevated. Will increase Norvasc to 10 mg per day. She just had her 5 mg pills filled for 90 days. Will have her take two 5 mg pills per day for the next 45 days then she will call us for 10 mg pills.

## 2011-07-28 NOTE — Assessment & Plan Note (Signed)
Stable No changes 

## 2011-07-28 NOTE — Progress Notes (Signed)
History of Present Illness:65 yo WF with history of CAD s/p 1V CABG 2010, HTN, hyperlipidemia, GERD here today for cardiac follow up. She has been followed by Dr. Delane Ginger and was most recently seen by Dr. Elease Hashimoto in April 2012. I saw her as a new patient  In June 2012 for a second opinion on her CAD. Her cardiac history dates back to November 2010 at which time she had CP, abnormal myoview and a cardiac cath that led to a single vessel CABG November 2010, LIMA to LAD. Echo November 2010 with normal LV function. No significant valvular abnormalities. She has not been able to tolerate statins in the past. She has felt that her strength is better off of statins.  She told me at the first visit  that she has been feeling well. No chest pain, dyspnea on exertion. No palpitations, near syncope or syncope. She has been swimming 3 times per week and has been taking a high intensity deep water swimming class.   She is changing primary care providers.      Past Medical History  Diagnosis Date  . Coronary artery disease   . Hyperlipidemia   . Hypertension   . Heart murmur     very soft heart murmur  . Acid reflux   . History of shortness of breath   . Dyslipidemia     Past Surgical History  Procedure Date  . Cardiac catheterization 07/27/09 & 07/28/09  . Cesarean section   . Cesarean section   . Vesicovaginal fistula closure w/ tah 1998  . Gallbladder surgery 2001  . Shoulder surgery 1998 / 2001    from accident  . Coronary artery bypass graft 08/03/2009    x1 using left internal mammary artery to distal left anterior  descending coronary artery.     Current Outpatient Prescriptions  Medication Sig Dispense Refill  . amLODipine (NORVASC) 5 MG tablet Take 1 tablet (5 mg total) by mouth daily.  90 tablet  3  . aspirin 325 MG tablet Take 325 mg by mouth daily.        Marland Kitchen b complex vitamins tablet Take 1 tablet by mouth daily.        . folic acid (FOLVITE) 400 MCG tablet Take 400 mcg by mouth  daily.        Marland Kitchen lisinopril (PRINIVIL,ZESTRIL) 10 MG tablet Take 10 mg by mouth daily.        . metoprolol (LOPRESSOR) 50 MG tablet 1 & 1/2 tablet 2 (two) times daily      . Omega-3 Fatty Acids (FISH OIL) 300 MG CAPS Take 2 capsules by mouth daily.       . vitamin B-12 (CYANOCOBALAMIN) 1000 MCG tablet Take 1,000 mcg by mouth daily.          Allergies  Allergen Reactions  . Ceclor (Cefaclor)   . Crestor (Rosuvastatin Calcium)   . Erythromycin   . Lipitor (Atorvastatin Calcium)   . Penicillins   . Sulfur   . Tetracyclines & Related     History   Social History  . Marital Status: Married    Spouse Name: N/A    Number of Children: N/A  . Years of Education: N/A   Occupational History  . Not on file.   Social History Main Topics  . Smoking status: Former Smoker -- 7 years    Types: Cigarettes    Quit date: 07/12/2010  . Smokeless tobacco: Never Used  . Alcohol Use: No  . Drug  Use: No  . Sexually Active: Not on file   Other Topics Concern  . Not on file   Social History Narrative  . No narrative on file    Family History  Problem Relation Age of Onset  . Cancer Father   . Heart attack Father   . Heart attack Mother   . Heart disease Mother     had pacemaker  . Cancer Brother   . Heart disease Brother   . Heart disease Sister   . Cancer Sister   . Angina Sister   . Coronary artery disease Son   . Hypertension Sister   . Hypertension Brother     Review of Systems:  As stated in the HPI and otherwise negative.   BP 142/76  Pulse 51  Ht 5\' 2"  (1.575 m)  Wt 199 lb (90.266 kg)  BMI 36.40 kg/m2  Physical Examination: General: Well developed, well nourished, NAD HEENT: OP clear, mucus membranes moist SKIN: warm, dry. No rashes. Neuro: No focal deficits Musculoskeletal: Muscle strength 5/5 all ext Psychiatric: Mood and affect normal Neck: No JVD, no carotid bruits, no thyromegaly, no lymphadenopathy. Lungs:Clear bilaterally, no wheezes, rhonci,  crackles Cardiovascular: Regular rate and rhythm. No murmurs, gallops or rubs. Abdomen:Soft. Bowel sounds present. Non-tender.  Extremities: No lower extremity edema. Pulses are 2 + in the bilateral DP/PT.

## 2011-07-28 NOTE — Assessment & Plan Note (Signed)
Will check lipids. She does not tolerate statins.

## 2011-08-01 ENCOUNTER — Telehealth: Payer: Self-pay | Admitting: Cardiovascular Disease

## 2011-08-01 DIAGNOSIS — E785 Hyperlipidemia, unspecified: Secondary | ICD-10-CM

## 2011-08-01 MED ORDER — EZETIMIBE 10 MG PO TABS: 10.0000 mg | ORAL_TABLET | Freq: Every day | ORAL | Status: DC

## 2011-08-01 NOTE — Telephone Encounter (Signed)
Left message to call back  

## 2011-08-01 NOTE — Telephone Encounter (Signed)
F/up to previous call °Pt returning your call about test results °

## 2011-08-01 NOTE — Telephone Encounter (Signed)
FU Call: Pt returning call to find out test results. Please return pt call to discuss further.

## 2011-08-01 NOTE — Telephone Encounter (Signed)
Notes Recorded by Verne Carrow, MD on 07/31/2011 at 8:53 AM LDL is not at goal. Doesn't tolerate statins. Can we start Zetia 10 mg po once daily. Thanks, chris ------   Spoke with pt and gave her above information copied from lab results from Dr. Clifton James. She is agreeable to trying Zetia. [facility] [address] [facility].  She will come in for fasting lipid profile in 3 months.

## 2011-08-31 ENCOUNTER — Telehealth: Payer: Self-pay | Admitting: *Deleted

## 2011-08-31 NOTE — Telephone Encounter (Signed)
[facility] to confirm [facility] on the patient's amlodipine. They stated the patient had informed them her amlodipine was increased. Per Dr. Gibson Ramp last office note, amlodipine was being increased to 10mg  once daily. I have given instructions for amlodipine 10mg  once daily #30 with 11 refills.

## 2011-09-05 ENCOUNTER — Other Ambulatory Visit: Payer: Self-pay | Admitting: Cardiovascular Disease

## 2011-09-21 ENCOUNTER — Other Ambulatory Visit: Payer: Self-pay | Admitting: Cardiovascular Disease

## 2011-09-21 DIAGNOSIS — E785 Hyperlipidemia, unspecified: Secondary | ICD-10-CM

## 2011-09-21 DIAGNOSIS — I10 Essential (primary) hypertension: Secondary | ICD-10-CM

## 2011-09-21 NOTE — Telephone Encounter (Signed)
Pt wants these meds sent to [facility]

## 2011-09-22 MED ORDER — EZETIMIBE 10 MG PO TABS: 10.0000 mg | ORAL_TABLET | Freq: Every day | ORAL | Status: DC

## 2011-09-22 MED ORDER — AMLODIPINE BESYLATE 10 MG PO TABS: 10.0000 mg | ORAL_TABLET | Freq: Every day | ORAL | Status: DC

## 2011-09-26 ENCOUNTER — Other Ambulatory Visit: Payer: Self-pay | Admitting: Cardiovascular Disease

## 2011-09-26 DIAGNOSIS — E785 Hyperlipidemia, unspecified: Secondary | ICD-10-CM

## 2011-09-26 DIAGNOSIS — I10 Essential (primary) hypertension: Secondary | ICD-10-CM

## 2011-09-26 MED ORDER — AMLODIPINE BESYLATE 10 MG PO TABS: 10.0000 mg | ORAL_TABLET | Freq: Every day | ORAL | Status: DC

## 2011-09-26 MED ORDER — EZETIMIBE 10 MG PO TABS: 10.0000 mg | ORAL_TABLET | Freq: Every day | ORAL | Status: DC

## 2011-09-26 NOTE — Telephone Encounter (Signed)
I was unable to find phone number for [facility]. Pt only left fax number.  I called [facility] in e-scribing but this was for specialty meds only. [facility] is listed under [facility]. I confirmed fax number listed as being the same fax number pt provided.  I updated pt's pharmacy in chart and e - prescribed amlodipine and Zetia refills to [facility].  I left message for pt to call back to give her this information.

## 2011-09-26 NOTE — Telephone Encounter (Signed)
Received call from schedulers that pt was on phone and wanted to speak with nurse regarding refills. She did not want to leave a message and would not tell schedulers what medicine she was calling about.  Phone call was transferred to me. I confirmed pt's date of birth and asked her what medicine she was calling about. Pt stated she "would call back later." She then hung up.

## 2011-09-26 NOTE — Telephone Encounter (Signed)
Please call RX in ASAP. Pt said she is almost out of medication and only has two tablets left. [facility] Fax # [phone]

## 2011-09-29 ENCOUNTER — Telehealth: Payer: Self-pay | Admitting: Cardiovascular Disease

## 2011-09-29 NOTE — Telephone Encounter (Signed)
Pt called back and wants Pat to send in a "challenge" to [facility] for her zetia.

## 2011-09-29 NOTE — Telephone Encounter (Signed)
Pt calling re change of insurance and an rx that's incorrect

## 2011-10-02 ENCOUNTER — Other Ambulatory Visit: Payer: Self-pay

## 2011-10-02 MED ORDER — METOPROLOL TARTRATE 50 MG PO TABS: 50.0000 mg | ORAL_TABLET | Freq: Two times a day (BID) | ORAL | Status: DC

## 2011-10-02 MED ORDER — LISINOPRIL 10 MG PO TABS: 10.0000 mg | ORAL_TABLET | Freq: Every day | ORAL | Status: DC

## 2011-10-02 NOTE — Telephone Encounter (Signed)
..   Requested Prescriptions   Signed Prescriptions Disp Refills  . lisinopril (PRINIVIL,ZESTRIL) 10 MG tablet 90 tablet 10    Sig: Take 1 tablet (10 mg total) by mouth daily.    Authorizing Provider: Verne Carrow    Ordering User: Iria Jamerson M  . metoprolol (LOPRESSOR) 50 MG tablet 90 tablet 10    Sig: Take 1 tablet (50 mg total) by mouth 2 (two) times daily. 1 & 1/2 tablet 2 (two) times daily    Authorizing Provider: Verne Carrow    Ordering User: Christella Hartigan, Pennelope Basque Judie Petit

## 2011-10-03 NOTE — Telephone Encounter (Signed)
Spoke with pt and told her paperwork has been completed and will be faxed to [facility] for tier exception on Zetia

## 2011-10-06 ENCOUNTER — Telehealth: Payer: Self-pay | Admitting: Cardiovascular Disease

## 2011-10-06 ENCOUNTER — Other Ambulatory Visit: Payer: Self-pay | Admitting: *Deleted

## 2011-10-06 MED ORDER — LISINOPRIL 10 MG PO TABS: 10.0000 mg | ORAL_TABLET | Freq: Every day | ORAL | Status: DC

## 2011-10-06 MED ORDER — METOPROLOL TARTRATE 50 MG PO TABS: 75.0000 mg | ORAL_TABLET | Freq: Two times a day (BID) | ORAL | Status: DC

## 2011-10-06 NOTE — Telephone Encounter (Signed)
Follow up previous call;  Per Kenney Houseman from [place] . Pt has approval for mediation Zetia  Start date on 1/23 approval for year.

## 2011-10-06 NOTE — Telephone Encounter (Signed)
New Problem:     Called for a diagnosis and any other medications in the same class of ezetimibe (ZETIA) 10 MG tablet that the patient is taking. Please call back.

## 2011-10-23 ENCOUNTER — Telehealth: Payer: Self-pay | Admitting: Cardiovascular Disease

## 2011-10-23 NOTE — Telephone Encounter (Signed)
New Msg: Pt calling wanting to speak with MD regarding pt lab results. Pt said potassium levels were high. Pt researched on the Internet and found that elevated potassium can be associated with zetia. As a result pt has taken herself off zetia. Pt wanted to discuss this with Md. Please return pt call to discuss further.

## 2011-10-23 NOTE — Telephone Encounter (Signed)
Pt states that she had labs drawn at her PCP and her K+ is high, she did not know the actual results.  Having done internet research, she has concluded her symptoms of hearing loss, back pain, dark urine, being "crazy headed", stomach pain and now having a high K+, are a direct result of her Zetia.  She has taken herself off the Zetia 5 days ago and her symptoms are gone and she feels "human again".  Advised pt to have PCP send D[facility]. [facility] a copy of labs.  Will forward this note to Dr. Clifton James and Charlotte Crumb, RN.

## 2011-10-23 NOTE — Telephone Encounter (Signed)
Would remain off of Zetia. Thanks, chris

## 2011-10-24 NOTE — Telephone Encounter (Signed)
Pt aware.

## 2011-10-25 ENCOUNTER — Other Ambulatory Visit: Payer: Medicare Other | Admitting: *Deleted

## 2011-11-25 ENCOUNTER — Other Ambulatory Visit: Payer: Self-pay | Admitting: Cardiovascular Disease

## 2012-01-15 ENCOUNTER — Telehealth: Payer: Self-pay | Admitting: Cardiovascular Disease

## 2012-01-15 DIAGNOSIS — I2510 Atherosclerotic heart disease of native coronary artery without angina pectoris: Secondary | ICD-10-CM

## 2012-01-15 MED ORDER — ASPIRIN 81 MG PO TABS: 81.0000 mg | ORAL_TABLET | Freq: Every day | ORAL | Status: DC

## 2012-01-15 NOTE — Telephone Encounter (Signed)
Agree. Thanks, chris 

## 2012-01-15 NOTE — Telephone Encounter (Signed)
Spoke with pt who reports her face has been very sensitive recently. Notes scratches on face when washing with washcloth or pillowcase rubbing face.  Also complaining of bruising on arms and legs. States bruising has been getting progressively worse for last 3-4 weeks.  Also complaining of brittle fingernails.  I told pt I would send message to Dr. Clifton James to see if aspirin could be decreased.  I also instructed her  she should contact primary care to make appt to have bruising, face sensitivity and brittle nails evaluated.

## 2012-01-15 NOTE — Telephone Encounter (Signed)
Can she change to lower dose asa or does she need to continue asa 325 mg daily?

## 2012-01-15 NOTE — Telephone Encounter (Signed)
Pt wants to know if she can decrease her meds, bruising very easily and face gets scratched just from washing with a washcloth, pls call

## 2012-01-15 NOTE — Telephone Encounter (Signed)
Spoke with pt and gave her instructions to decrease asa to 81 mg daily.

## 2012-01-15 NOTE — Telephone Encounter (Signed)
ASA 81 mg should be ok. Thanks, chris

## 2012-03-07 ENCOUNTER — Other Ambulatory Visit: Payer: Self-pay | Admitting: Cardiovascular Disease

## 2012-03-07 ENCOUNTER — Telehealth: Payer: Self-pay | Admitting: Cardiovascular Disease

## 2012-03-07 MED ORDER — METOPROLOL TARTRATE 50 MG PO TABS: 75.0000 mg | ORAL_TABLET | Freq: Two times a day (BID) | ORAL | Status: DC

## 2012-03-07 NOTE — Telephone Encounter (Signed)
Fax Received. Refill Completed. Samantha Short (R.M.A)   

## 2012-03-07 NOTE — Telephone Encounter (Signed)
[facility]

## 2012-03-07 NOTE — Telephone Encounter (Signed)
New Problem:    Patient called in needing a refill of her metoprolol (LOPRESSOR) 50 MG tablet filled with [facility] [phone].  Patient stated that the amount of pills she is supposed to receive should be 270 and she only received 90 so she ran out early.  Patient would also like a 15 day [facility] [address], patient did not know the pharmacy phone number. Please call back once the order has been placed and if you have any questions.

## 2012-03-08 ENCOUNTER — Other Ambulatory Visit: Payer: Self-pay

## 2012-03-08 MED ORDER — METOPROLOL TARTRATE 50 MG PO TABS: 75.0000 mg | ORAL_TABLET | Freq: Two times a day (BID) | ORAL | Status: DC

## 2012-03-08 NOTE — Telephone Encounter (Signed)
Refill done.  

## 2012-04-04 ENCOUNTER — Telehealth: Payer: Self-pay | Admitting: Cardiovascular Disease

## 2012-04-04 NOTE — Telephone Encounter (Signed)
I spoke with the pt and she is complaining of swelling in her left foot (left leg was not used as graft site for CABG). The pt denies any swelling in her calf area, no pain or redness, foot is not warm to touch.  The pt does not have any swelling in her right leg.  The pt said she worked in her yard last Wednesday and then swelling developed on Thursday.  The pt does not have any swelling today but did notice some swelling last night.  The pt does not remember turning her ankle while in the yard.  I instructed the pt to contact her PCP if she continues to have issues with swelling. The pt agreed with plan.

## 2012-04-04 NOTE — Telephone Encounter (Signed)
Pt left foot has been swollen it started last Thursday and she stayed off of it until Monday and it went down and then it started dwelling again and it's not as bad but it is still swollen. She would like a call back to discuss this

## 2012-04-04 NOTE — Telephone Encounter (Signed)
Agree. Thanks, chris 

## 2012-04-16 ENCOUNTER — Other Ambulatory Visit: Payer: Self-pay

## 2012-04-16 ENCOUNTER — Other Ambulatory Visit: Payer: Self-pay | Admitting: Cardiovascular Disease

## 2012-04-16 MED ORDER — METOPROLOL TARTRATE 50 MG PO TABS: 75.0000 mg | ORAL_TABLET | Freq: Two times a day (BID) | ORAL | Status: DC

## 2012-05-16 ENCOUNTER — Other Ambulatory Visit: Payer: Self-pay | Admitting: Cardiovascular Disease

## 2012-05-16 MED ORDER — LISINOPRIL 10 MG PO TABS: 10.0000 mg | ORAL_TABLET | Freq: Every day | ORAL | Status: DC

## 2012-07-04 ENCOUNTER — Emergency Department (HOSPITAL_COMMUNITY): Payer: Medicare Other

## 2012-07-04 ENCOUNTER — Observation Stay (HOSPITAL_COMMUNITY)
Admission: EM | Admit: 2012-07-04 | Discharge: 2012-07-05 | Disposition: A | Discharge status: Home / Self Care | Payer: Medicare Other | Attending: Cardiology | Admitting: Cardiology

## 2012-07-04 ENCOUNTER — Encounter (HOSPITAL_COMMUNITY): Payer: Self-pay | Admitting: *Deleted

## 2012-07-04 DIAGNOSIS — I509 Heart failure, unspecified: Secondary | ICD-10-CM | POA: Insufficient documentation

## 2012-07-04 DIAGNOSIS — I25119 Atherosclerotic heart disease of native coronary artery with unspecified angina pectoris: Secondary | ICD-10-CM | POA: Diagnosis present

## 2012-07-04 DIAGNOSIS — K219 Gastro-esophageal reflux disease without esophagitis: Secondary | ICD-10-CM | POA: Diagnosis present

## 2012-07-04 DIAGNOSIS — I10 Essential (primary) hypertension: Secondary | ICD-10-CM | POA: Insufficient documentation

## 2012-07-04 DIAGNOSIS — I200 Unstable angina: Secondary | ICD-10-CM

## 2012-07-04 DIAGNOSIS — E785 Hyperlipidemia, unspecified: Secondary | ICD-10-CM | POA: Diagnosis present

## 2012-07-04 DIAGNOSIS — I2510 Atherosclerotic heart disease of native coronary artery without angina pectoris: Secondary | ICD-10-CM | POA: Insufficient documentation

## 2012-07-04 DIAGNOSIS — R0789 Other chest pain: Principal | ICD-10-CM | POA: Insufficient documentation

## 2012-07-04 DIAGNOSIS — R0602 Shortness of breath: Secondary | ICD-10-CM

## 2012-07-04 DIAGNOSIS — I5032 Chronic diastolic (congestive) heart failure: Secondary | ICD-10-CM | POA: Insufficient documentation

## 2012-07-04 HISTORY — DX: Unspecified asthma, uncomplicated: J45.909

## 2012-07-04 HISTORY — DX: Nonrheumatic mitral (valve) insufficiency: I34.0

## 2012-07-04 HISTORY — DX: Chronic diastolic (congestive) heart failure: I50.32

## 2012-07-04 HISTORY — DX: Pneumonia, unspecified organism: J18.9

## 2012-07-04 LAB — CBC
MCV: 89.3 fL (ref 78.0–100.0)
Platelets: 246 10*3/uL (ref 150–400)
RBC: 4.2 MIL/uL (ref 3.87–5.11)
RDW: 13.5 % (ref 11.5–15.5)
WBC: 9.5 10*3/uL (ref 4.0–10.5)

## 2012-07-04 LAB — D-DIMER, QUANTITATIVE: D-Dimer, Quant: 0.61 ug/mL-FEU — ABNORMAL HIGH (ref 0.00–0.48)

## 2012-07-04 LAB — BASIC METABOLIC PANEL
CO2: 27 mEq/L (ref 19–32)
Chloride: 106 mEq/L (ref 96–112)
Creatinine, Ser: 1 mg/dL (ref 0.50–1.10)
GFR calc Af Amer: 67 mL/min — ABNORMAL LOW (ref >90)
Sodium: 142 mEq/L (ref 135–145)

## 2012-07-04 LAB — PRO B NATRIURETIC PEPTIDE: Pro B Natriuretic peptide (BNP): 1172 pg/mL — ABNORMAL HIGH (ref 0–125)

## 2012-07-04 LAB — POCT I-STAT TROPONIN I: Troponin i, poc: 0.02 ng/mL (ref 0.00–0.08)

## 2012-07-04 MED ORDER — OMEGA-3-ACID ETHYL ESTERS 1 G PO CAPS
1.0000 g | ORAL_CAPSULE | Freq: Every day | ORAL | Status: DC
  Administered 2012-07-05: 1 g via ORAL
  Filled 2012-07-04: qty 1

## 2012-07-04 MED ORDER — VITAMIN B-12 1000 MCG PO TABS
1000.0000 ug | ORAL_TABLET | Freq: Every day | ORAL | Status: DC
  Administered 2012-07-05: 1000 ug via ORAL
  Filled 2012-07-04: qty 1

## 2012-07-04 MED ORDER — SODIUM CHLORIDE 0.9 % IJ SOLN: 3.0000 mL | INTRAMUSCULAR | Status: DC | PRN

## 2012-07-04 MED ORDER — ASPIRIN EC 81 MG PO TBEC: 81.0000 mg | DELAYED_RELEASE_TABLET | Freq: Every day | ORAL | Status: DC

## 2012-07-04 MED ORDER — ASPIRIN 325 MG PO TABS
325.0000 mg | ORAL_TABLET | Freq: Once | ORAL | Status: AC
  Administered 2012-07-04: 325 mg via ORAL
  Filled 2012-07-04: qty 1

## 2012-07-04 MED ORDER — ASPIRIN 81 MG PO CHEW
324.0000 mg | CHEWABLE_TABLET | ORAL | Status: AC
  Administered 2012-07-04: 324 mg via ORAL
  Filled 2012-07-04: qty 4

## 2012-07-04 MED ORDER — INFLUENZA VIRUS VACC SPLIT PF IM SUSP
0.5000 mL | INTRAMUSCULAR | Status: DC
  Filled 2012-07-04: qty 0.5

## 2012-07-04 MED ORDER — NITROGLYCERIN 0.4 MG SL SUBL
0.4000 mg | SUBLINGUAL_TABLET | SUBLINGUAL | Status: DC | PRN
Start: 2012-07-04 — End: 2012-07-05

## 2012-07-04 MED ORDER — ASPIRIN 81 MG PO CHEW
324.0000 mg | CHEWABLE_TABLET | ORAL | Status: AC
  Administered 2012-07-05: 324 mg via ORAL
  Filled 2012-07-04: qty 4

## 2012-07-04 MED ORDER — ACETAMINOPHEN 325 MG PO TABS
650.0000 mg | ORAL_TABLET | ORAL | Status: DC | PRN
  Filled 2012-07-04: qty 2

## 2012-07-04 MED ORDER — ONDANSETRON HCL 4 MG/2ML IJ SOLN
4.0000 mg | Freq: Four times a day (QID) | INTRAMUSCULAR | Status: DC | PRN
  Administered 2012-07-05: 4 mg via INTRAVENOUS
  Filled 2012-07-04: qty 2

## 2012-07-04 MED ORDER — LISINOPRIL 10 MG PO TABS
10.0000 mg | ORAL_TABLET | Freq: Every day | ORAL | Status: DC
  Administered 2012-07-04 – 2012-07-05 (×2): 10 mg via ORAL
  Filled 2012-07-04 (×2): qty 1

## 2012-07-04 MED ORDER — METOPROLOL TARTRATE 50 MG PO TABS
75.0000 mg | ORAL_TABLET | Freq: Two times a day (BID) | ORAL | Status: DC
  Administered 2012-07-04 – 2012-07-05 (×2): 75 mg via ORAL
  Filled 2012-07-04 (×3): qty 1

## 2012-07-04 MED ORDER — HEPARIN BOLUS VIA INFUSION
4000.0000 [IU] | Freq: Once | INTRAVENOUS | Status: AC
  Administered 2012-07-04: 4000 [IU] via INTRAVENOUS

## 2012-07-04 MED ORDER — HEPARIN (PORCINE) IN NACL 100-0.45 UNIT/ML-% IJ SOLN
900.0000 [IU]/h | INTRAMUSCULAR | Status: DC
  Administered 2012-07-04: 900 [IU]/h via INTRAVENOUS
  Filled 2012-07-04: qty 250

## 2012-07-04 MED ORDER — SODIUM CHLORIDE 0.9 % IV SOLN: 250.0000 mL | INTRAVENOUS | Status: DC | PRN

## 2012-07-04 MED ORDER — NITROGLYCERIN 2 % TD OINT
0.5000 [in_us] | TOPICAL_OINTMENT | Freq: Four times a day (QID) | TRANSDERMAL | Status: DC
  Administered 2012-07-05 (×3): 0.5 [in_us] via TOPICAL
  Filled 2012-07-04: qty 30

## 2012-07-04 MED ORDER — ASPIRIN 300 MG RE SUPP
300.0000 mg | RECTAL | Status: AC
  Filled 2012-07-04: qty 1

## 2012-07-04 MED ORDER — ASPIRIN 81 MG PO TABS: 81.0000 mg | ORAL_TABLET | Freq: Every day | ORAL | Status: DC

## 2012-07-04 MED ORDER — NITROGLYCERIN 0.4 MG SL SUBL: 0.4000 mg | SUBLINGUAL_TABLET | SUBLINGUAL | Status: DC | PRN

## 2012-07-04 MED ORDER — SODIUM CHLORIDE 0.9 % IV SOLN: 1.0000 mL/kg/h | INTRAVENOUS | Status: DC

## 2012-07-04 MED ORDER — SODIUM CHLORIDE 0.9 % IJ SOLN: 3.0000 mL | Freq: Two times a day (BID) | INTRAMUSCULAR | Status: DC

## 2012-07-04 NOTE — ED Notes (Signed)
NP student with [facility] in to speak with patient

## 2012-07-04 NOTE — ED Notes (Signed)
Heparin bolus and infusion verified with [facility] RN.

## 2012-07-04 NOTE — ED Notes (Signed)
Patient reports she has had increasing sob over the past 2 weeks.  She states today after walking up steps she has had increased sob/chest pain that has not resolved.  Patient is seen by Dr Kennon Rounds.  Patient has hx of cabg

## 2012-07-04 NOTE — Progress Notes (Signed)
ANTICOAGULATION CONSULT NOTE - Initial Consult  Pharmacy Consult for Heparin Indication: chest pain/ACS  Allergies  Allergen Reactions  . Ceclor (Cefaclor) Other (See Comments)    Reaction unknown  . Crestor (Rosuvastatin Calcium) Other (See Comments)    Reaction unknown  . Erythromycin Other (See Comments)    Reaction unknown  . Lipitor (Atorvastatin Calcium) Other (See Comments)    Reaction unknown  . Penicillins Other (See Comments)    Reaction unknown  . Sulfur Other (See Comments)    Reaction unknown  . Tetracyclines & Related Other (See Comments)    Reaction unknown    Patient Measurements: Height: 5\' 2"  (157.5 cm) Weight: 195 lb (88.451 kg) IBW/kg (Calculated) : 50.1  Heparin Dosing Weight: 70.4 kg  Vital Signs: Temp: 97.6 F (36.4 C) (10/24 1404) Temp src: Oral (10/24 1404) BP: 156/92 mmHg (10/24 1901) Pulse Rate: 50  (10/24 1700)  Labs:  Basename 07/04/12 1415  HGB 12.4  HCT 37.5  PLT 246  APTT --  LABPROT --  INR --  HEPARINUNFRC --  CREATININE 1.00  CKTOTAL --  CKMB --  TROPONINI --    Estimated Creatinine Clearance: 58 ml/min (by C-G formula based on Cr of 1).   Medical History: Past Medical History  Diagnosis Date  . Coronary artery disease     a. s/p CABG x 1 2010:  LIMA->LAD.  Marland Kitchen Hyperlipidemia   . Hypertension   . Chronic diastolic CHF (congestive heart failure)     a. 02/2011 Echo: EF 55-60%, Gr2 DD, Mild MR  . Acid reflux   . History of shortness of breath   . Dyslipidemia   . Mitral regurgitation     a. mild by echo 02/2011.    Assessment: 66 y.o. F with significant cardiac history including CAD (CABG '10), HTN, and DL who presented to the [facility] on 10/24 with CP. Cardiac enzymes are negative thus far. Pharmacy has been consulted to start heparin while awaiting further cardiac evaluation.  The patient was noted to only be on ASA in terms of blood thinners PTA. No recent surgeries, bleeding, or CVA noted. Hep wt~70.4 kg, baseline  Hgb/Hct/Plt wnl.   Goal of Therapy:  Heparin level 0.3-0.7 units/ml Monitor platelets by anticoagulation protocol: Yes   Plan:  1. Heparin bolus of 4000 units x 1 2. Initiate heparin at drip rate of 900 units/hr 3. Daily heparin levels, CBC 4. Will continue to monitor for any signs/symptoms of bleeding and will follow up with heparin level in 6 hours   Georgina Pillion, PharmD, BCPS Clinical Pharmacist Pager: [phone] 07/04/2012 8:15 PM

## 2012-07-04 NOTE — H&P (Signed)
Patient ID: Samantha Short MRN: [id number], DOB/AGE: 04-11-1946   Admit date: 07/04/2012  Primary Physician: Kathie Rhodes. Karleen Hampshire, [place] Primary Cardiologist: C. Clifton James, MD  Pt. Profile:  66 Year old female with prior cardiac history complaining of chest pain, dyspnea, and fatigue.   Problem List  Past Medical History  Diagnosis Date  . Coronary artery disease     a. s/p CABG x 1 2010:  LIMA->LAD  . Hyperlipidemia   . Hypertension   . Heart murmur     very soft heart murmur  . Acid reflux   . History of shortness of breath   . Dyslipidemia     Past Surgical History  Procedure Date  . Cardiac catheterization 07/27/09 & 07/28/09  . Cesarean section   . Cesarean section   . Vesicovaginal fistula closure w/ tah 1998  . Gallbladder surgery 2001  . Shoulder surgery 1998 / 2001    from accident  . Coronary artery bypass graft 08/03/2009    x1 using left internal mammary artery to distal left anterior  descending coronary artery.     Allergies  Allergies  Allergen Reactions  . Ceclor (Cefaclor) Other (See Comments)    Reaction unknown  . Crestor (Rosuvastatin Calcium) Other (See Comments)    Reaction unknown  . Erythromycin Other (See Comments)    Reaction unknown  . Lipitor (Atorvastatin Calcium) Other (See Comments)    Reaction unknown  . Penicillins Other (See Comments)    Reaction unknown  . Sulfur Other (See Comments)    Reaction unknown  . Tetracyclines & Related Other (See Comments)    Reaction unknown   HPI  Samantha Short is a 66 year old female with the above problem list, which includes CAD with CABG x 1 in 07/2009 (LIMA -> LAD). About 3 weeks ago she began to notice SOB and fatigue along with intermittent chest discomfort.  She does not weigh herself daily but denies pnd, orthopnea, n, v, dizziness, syncope, edema, or early satiety.  She notes that her c/p has been occurring at rest, approx 2x/wk, assoc w/ dyspnea and diaph, lasting about and resolving  spont.  She swims 3x wk, and has cont to swim during this period of increased Ss, and has never had chest pain when swimming.  Today while ascending a flight of stairs at 0930 she became short of breath, fatigued, and experienced chest pain with mild nausea. She felt as though she would not be able to finish the staircase. The chest pain was described as non-radiating mid-sternal pressure. It lasted for 30 minutes. 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 At home she "did not feel right" and felt "Crazy-headed". She knew something was wrong and came to the ED. At presentation her BP was 182/73 and HR 56. She was given ASA 325mg . Currently she complains of fatigue and 1/10 left chest tightness. BP now 135/74 and HR 50.  Home Medications  Prior to Admission medications   Medication Sig Start Date End Date Taking? Authorizing Provider  aspirin 81 MG tablet Take 81 mg by mouth daily. 01/15/12  Yes Kathleene Hazel, MD  b complex vitamins tablet Take 1 tablet by mouth daily.     Yes Historical Provider, MD  lisinopril (PRINIVIL,ZESTRIL) 10 MG tablet Take 10 mg by mouth daily. 05/16/12  Yes Kathleene Hazel, MD  metoprolol (LOPRESSOR) 50 MG tablet Take 75 mg by mouth  2 (two) times daily. 04/16/12  Yes Kathleene Hazel, MD  nitroGLYCERIN (NITROSTAT) 0.4 MG SL tablet Place 0.4 mg under the tongue every 5 (five) minutes as needed. 07/28/11 07/27/12 Yes Kathleene Hazel, MD  Omega-3 Fatty Acids (FISH OIL) 300 MG CAPS Take 2 capsules by mouth daily.    Yes Historical Provider, MD  OVER THE COUNTER MEDICATION Take 1 application by mouth daily as needed. For rosacea   prosacea   Yes Historical Provider, MD  vitamin B-12 (CYANOCOBALAMIN) 1000 MCG tablet Take 1,000 mcg by mouth daily.     Yes Historical Provider, MD   Family History  Family History  Problem Relation Age of Onset  . Cancer Father   . Heart attack  Father   . Heart attack Mother   . Heart disease Mother     had pacemaker  . Cancer Brother   . Heart disease Brother   . Heart disease Sister   . Cancer Sister   . Angina Sister   . Coronary artery disease Son   . Hypertension Sister   . Hypertension Brother    Social History  History   Social History  . Marital Status: Married    Spouse Name: N/A    Number of Children: N/A  . Years of Education: N/A   Occupational History  . Not on file.   Social History Main Topics  . Smoking status: Former Smoker -- 7 years    Types: Cigarettes    Quit date: 07/12/2010  . Smokeless tobacco: Never Used  . Alcohol Use: No  . Drug Use: No  . Sexually Active: Not on file   Other Topics Concern  . Not on file   Social History Narrative  . No narrative on file    Review of Systems General:  No chills, fever, night sweats or weight changes.  Cardiovascular:  ++ chest pain, dyspnea on exertion.  Edema present, but" better than it has been". Denies orthopnea, palpitations, paroxysmal nocturnal dyspnea. Dermatological: No rash, lesions/masses Respiratory: Mild non-productive cough, dyspnea Urologic: No hematuria, dysuria Abdominal:   Mild nausea in setting of CP. No vomiting, diarrhea, bright red blood per rectum, melena, or hematemesis Neurologic:  No visual changes, wkns, changes in mental status. All other systems reviewed and are otherwise negative except as noted above.  Physical Exam  Blood pressure 135/74, pulse 50, temperature 97.6 F (36.4 C), temperature source Oral, resp. rate 18, height 5\' 2"  (1.575 m), weight 195 lb (88.451 kg), SpO2 100.00%.  General: Pleasant, NAD Psych: Normal affect. Neuro: Alert and oriented X 3. Moves all extremities spontaneously. HEENT: Normal  Neck: Supple without bruits or JVD. Lungs:  Resp regular and unlabored, CTA. Heart: RRR no s3, s4, or murmurs. Abdomen: Soft, non-tender, non-distended, BS + x 4.  Extremities: Trace edema to BLE.   No clubbing, or cyanosis. DP/PT/Radials 2+ and equal bilaterally.  Labs  Trop I POC 0.02  pNBP 1172.0  Lab Results  Component Value Date   WBC 9.5 07/04/2012   HGB 12.4 07/04/2012   HCT 37.5 07/04/2012   MCV 89.3 07/04/2012   PLT 246 07/04/2012     Lab 07/04/12 1415  NA 142  K 3.9  CL 106  CO2 27  BUN 15  CREATININE 1.00  CALCIUM 9.6  PROT --  BILITOT --  ALKPHOS --  ALT --  AST --  GLUCOSE 117*    Radiology/Studies  Dg Chest 2 View  07/04/2012  *RADIOLOGY REPORT*  Clinical Data:  66 year old female shortness of breath chest pressure.  CHEST - 2 VIEW  Comparison: 03/11.  Findings: Stable sequelae of CABG.  Cardiac size and mediastinal contours are within normal limits.  Slightly lower lung volumes. Visualized tracheal air column is within normal limits.  Scattered calcified granulomas in both lungs are stable.  No pneumothorax. No pleural effusion or pulmonary edema.  No consolidation or confluent pulmonary opacity. No acute osseous abnormality identified.  IMPRESSION: Lower lung volumes, otherwise no acute cardiopulmonary abnormality.   Original Report Authenticated By: Ulla Potash III, M.D.    ECG  Sinus Bradycardia, rate 57.  Incomplete RBBB, No acute ST/T changes.   ASSESSMENT AND PLAN  1. Unstable Angina: She has been having intermittent chest pain @ rest for the past 3 wks, though she never had exertional Ss, despite fairly heavy exertion (swimming), until this AM.  Ss this AM very concerning for angina.  CE negative so far and ecg non-acute.  Place in observation and cycle CE. Cont home meds.  She cont to c/o 1/10 chest tightness, thus will add heparin and ntp.  Cardiac cath in AM.  2. Chronic Diastolic CHF:  Patient has had progressive SOB and fatigue over past three weeks without pnd, orthopnea, or edema.  She does not weigh herself.  BP has been elevated in ER.  Last echo in 02/2011 showed nl EF with grade 2 DD.  She has mild elevation of pBNP without significant  volume overload on exam.   Titrate home meds for improved bp control and consider diuretic.  3. HTN: BP 182/73 at presentation, 135/74 now. Titrate home meds as necessary and consider diuretic.  Nicolasa Ducking, NP 07/04/2012, 5:29 PM  Cardiology Attending Patient interviewed and examined. Discussed with Nicolasa Ducking, NP.  Above note annotated and modified based upon my findings.  Types the past month, patient has not been troubled with chest discomfort. Coronary disease was initially detected by diagnostic testing in the absence of significant symptoms. Negative for EKG and initial cardiac markers are somewhat reassuring, but symptoms have been impressive, particularly today. I doubt that patient will be satisfied without a definite answer as to what is causing her problems; moreover, optimal treatment cannot be offered without knowledge of current coronary anatomy. The risks and benefits of cardiac catheterization were explained to her including stroke, myocardial infarction and death. She agrees to proceed, and test will be scheduled for the morning.  [place] Bing, MD 07/04/2012, 8:41 PM

## 2012-07-04 NOTE — ED Notes (Signed)
Pt states she is having a weird pain in the back of her right leg. She was unable to describe, she stated last night the pain kept her awake.

## 2012-07-04 NOTE — ED Provider Notes (Addendum)
History     CSN: [id number]  Arrival date & time 07/04/12  1357   First MD Initiated Contact with Patient 07/04/12 1504      Chief Complaint  Patient presents with  . Chest Pain  . Shortness of Breath    (Consider location/radiation/quality/duration/timing/severity/associated sxs/prior treatment) The history is provided by the patient.  Samantha Short is a 66 y.o. female history of CAD status post CABG, hypertension, acid reflux, hyperlipidemia here presenting with chest pain and shortness of breath. Notes increasing shortness breath over the last week and today when she went up steps she felt very short of breath. 9 any pleuritic chest pain or shortness of breath when she lays down. She has some occasional cough but denies any fevers. Denies any nausea or abdominal pain. She did have any recent stress test or echo and she has been followed up with about cardiology. No leg swelling or recent travel or hx of DVT or PE.    Past Medical History  Diagnosis Date  . Coronary artery disease   . Hyperlipidemia   . Hypertension   . Heart murmur     very soft heart murmur  . Acid reflux   . History of shortness of breath   . Dyslipidemia     Past Surgical History  Procedure Date  . Cardiac catheterization 07/27/09 & 07/28/09  . Cesarean section   . Cesarean section   . Vesicovaginal fistula closure w/ tah 1998  . Gallbladder surgery 2001  . Shoulder surgery 1998 / 2001    from accident  . Coronary artery bypass graft 08/03/2009    x1 using left internal mammary artery to distal left anterior  descending coronary artery.     Family History  Problem Relation Age of Onset  . Cancer Father   . Heart attack Father   . Heart attack Mother   . Heart disease Mother     had pacemaker  . Cancer Brother   . Heart disease Brother   . Heart disease Sister   . Cancer Sister   . Angina Sister   . Coronary artery disease Son   . Hypertension Sister   . Hypertension Brother      History  Substance Use Topics  . Smoking status: Former Smoker -- 7 years    Types: Cigarettes    Quit date: 07/12/2010  . Smokeless tobacco: Never Used  . Alcohol Use: No    OB History    Grav Para Term Preterm Abortions TAB SAB Ect Mult Living                  Review of Systems  Respiratory: Positive for shortness of breath.   Cardiovascular: Positive for chest pain.  All other systems reviewed and are negative.    Allergies  Ceclor; Crestor; Erythromycin; Lipitor; Penicillins; Sulfur; and Tetracyclines & related  Home Medications   Current Outpatient Rx  Name Route Sig Dispense Refill  . ASPIRIN 81 MG PO TABS Oral Take 81 mg by mouth daily.    . B COMPLEX PO TABS Oral Take 1 tablet by mouth daily.      Marland Kitchen LISINOPRIL 10 MG PO TABS Oral Take 10 mg by mouth daily.    Marland Kitchen METOPROLOL TARTRATE 50 MG PO TABS Oral Take 75 mg by mouth 2 (two) times daily.    Marland Kitchen NITROGLYCERIN 0.4 MG SL SUBL Sublingual Place 0.4 mg under the tongue every 5 (five) minutes as needed.    Marland Kitchen FISH  OIL 300 MG PO CAPS Oral Take 2 capsules by mouth daily.     Marland Kitchen OVER THE COUNTER MEDICATION Oral Take 1 application by mouth daily as needed. For rosacea   prosacea    . VITAMIN B-12 1000 MCG PO TABS Oral Take 1,000 mcg by mouth daily.        BP 182/73  Pulse 56  Temp 97.6 F (36.4 C) (Oral)  Resp 18  Ht 5\' 2"  (1.575 m)  Wt 195 lb (88.451 kg)  BMI 35.67 kg/m2  SpO2 100%  Physical Exam  Nursing note and vitals reviewed. Constitutional: She is oriented to person, place, and time. She appears well-developed and well-nourished.  HENT:  Head: Normocephalic.  Mouth/Throat: Oropharynx is clear and moist.  Eyes: Conjunctivae normal are normal. Pupils are equal, round, and reactive to light.  Neck: Normal range of motion. Neck supple.  Cardiovascular: Normal rate, regular rhythm and normal heart sounds.   Pulmonary/Chest: Effort normal and breath sounds normal. No respiratory distress. She has no wheezes.  She has no rales.  Abdominal: Soft. Bowel sounds are normal. She exhibits no distension. There is no tenderness. There is no rebound.  Musculoskeletal: Normal range of motion. She exhibits no edema.  Neurological: She is alert and oriented to person, place, and time.  Skin: Skin is warm and dry.  Psychiatric: She has a normal mood and affect. Her behavior is normal. Judgment and thought content normal.    ED Course  Procedures (including critical care time)  Labs Reviewed  BASIC METABOLIC PANEL - Abnormal; Notable for the following:    Glucose, Bld 117 (*)     GFR calc non Af Amer 58 (*)     GFR calc Af Amer 67 (*)     All other components within normal limits  PRO B NATRIURETIC PEPTIDE - Abnormal; Notable for the following:    Pro B Natriuretic peptide (BNP) 1172.0 (*)     All other components within normal limits  CBC  POCT I-STAT TROPONIN I   Dg Chest 2 View  07/04/2012  *RADIOLOGY REPORT*  Clinical Data: 66 year old female shortness of breath chest pressure.  CHEST - 2 VIEW  Comparison: 03/11.  Findings: Stable sequelae of CABG.  Cardiac size and mediastinal contours are within normal limits.  Slightly lower lung volumes. Visualized tracheal air column is within normal limits.  Scattered calcified granulomas in both lungs are stable.  No pneumothorax. No pleural effusion or pulmonary edema.  No consolidation or confluent pulmonary opacity. No acute osseous abnormality identified.  IMPRESSION: Lower lung volumes, otherwise no acute cardiopulmonary abnormality.   Original Report Authenticated By: Ulla Potash III, M.D.      1. Unstable angina   2. Shortness of breath      Date: 07/04/2012  Rate: 57  Rhythm: sinus bradycardia  QRS Axis: normal  Intervals: normal  ST/T Wave abnormalities: nonspecific T wave changes  Conduction Disutrbances:none  Narrative Interpretation:   Old EKG Reviewed: unchanged    MDM  Samantha Short is a 66 y.o. female hx of HTN, CABG here with  chest pain, SOB. Concerning for ACS will consider CHF. Will get trop, CXR, labs, will consult [facility].   4:51 PM Trop neg x 1. BNP 1172, no baseline. Pain free now. Shelly will admit the patient for chest pain. Likely ACS.          Richardean Canal, MD 07/04/12 1652  Richardean Canal, MD 07/04/12 [phone]

## 2012-07-05 ENCOUNTER — Encounter (HOSPITAL_COMMUNITY)
Admission: EM | Disposition: A | Discharge status: Home / Self Care | Payer: Self-pay | Source: Home / Self Care | Attending: Emergency Medicine

## 2012-07-05 DIAGNOSIS — R0789 Other chest pain: Secondary | ICD-10-CM

## 2012-07-05 DIAGNOSIS — I2510 Atherosclerotic heart disease of native coronary artery without angina pectoris: Secondary | ICD-10-CM

## 2012-07-05 DIAGNOSIS — K219 Gastro-esophageal reflux disease without esophagitis: Secondary | ICD-10-CM | POA: Diagnosis present

## 2012-07-05 HISTORY — PX: LEFT HEART CATHETERIZATION WITH CORONARY/GRAFT ANGIOGRAM: SHX5450

## 2012-07-05 LAB — COMPREHENSIVE METABOLIC PANEL
Albumin: 3 g/dL — ABNORMAL LOW (ref 3.5–5.2)
Alkaline Phosphatase: 50 U/L (ref 39–117)
BUN: 13 mg/dL (ref 6–23)
CO2: 26 mEq/L (ref 19–32)
Chloride: 110 mEq/L (ref 96–112)
Creatinine, Ser: 0.91 mg/dL (ref 0.50–1.10)
GFR calc non Af Amer: 65 mL/min — ABNORMAL LOW (ref >90)
Potassium: 3.9 mEq/L (ref 3.5–5.1)
Total Bilirubin: 0.3 mg/dL (ref 0.3–1.2)

## 2012-07-05 LAB — CBC
MCH: 29 pg (ref 26.0–34.0)
MCV: 88.2 fL (ref 78.0–100.0)
Platelets: 197 10*3/uL (ref 150–400)
RBC: 3.72 MIL/uL — ABNORMAL LOW (ref 3.87–5.11)
RDW: 13.4 % (ref 11.5–15.5)
WBC: 8.4 10*3/uL (ref 4.0–10.5)

## 2012-07-05 LAB — TROPONIN I: Troponin I: <0.3 ng/mL (ref <0.30)

## 2012-07-05 LAB — PROTIME-INR: Prothrombin Time: 14.2 seconds (ref 11.6–15.2)

## 2012-07-05 LAB — LIPID PANEL
Cholesterol: 152 mg/dL (ref 0–200)
HDL: 54 mg/dL (ref >39)

## 2012-07-05 SURGERY — LEFT HEART CATHETERIZATION WITH CORONARY/GRAFT ANGIOGRAM
Anesthesia: LOCAL

## 2012-07-05 MED ORDER — HEPARIN (PORCINE) IN NACL 2-0.9 UNIT/ML-% IJ SOLN
INTRAMUSCULAR | Status: AC
  Filled 2012-07-05: qty 1000

## 2012-07-05 MED ORDER — LIDOCAINE HCL (PF) 1 % IJ SOLN
INTRAMUSCULAR | Status: AC
  Filled 2012-07-05: qty 30

## 2012-07-05 MED ORDER — ONDANSETRON HCL 4 MG/2ML IJ SOLN: 4.0000 mg | Freq: Four times a day (QID) | INTRAMUSCULAR | Status: DC | PRN

## 2012-07-05 MED ORDER — DIAZEPAM 2 MG PO TABS: 2.0000 mg | ORAL_TABLET | ORAL | Status: DC | PRN

## 2012-07-05 MED ORDER — MIDAZOLAM HCL 2 MG/2ML IJ SOLN
INTRAMUSCULAR | Status: AC
  Filled 2012-07-05: qty 2

## 2012-07-05 MED ORDER — ASPIRIN EC 325 MG PO TBEC: 325.0000 mg | DELAYED_RELEASE_TABLET | Freq: Every day | ORAL | Status: DC

## 2012-07-05 MED ORDER — ACETAMINOPHEN 325 MG PO TABS: 650.0000 mg | ORAL_TABLET | ORAL | Status: DC | PRN

## 2012-07-05 MED ORDER — OXYCODONE-ACETAMINOPHEN 5-325 MG PO TABS: 1.0000 | ORAL_TABLET | ORAL | Status: DC | PRN

## 2012-07-05 MED ORDER — LISINOPRIL 10 MG PO TABS: 10.0000 mg | ORAL_TABLET | Freq: Every day | ORAL | Status: DC

## 2012-07-05 MED ORDER — NITROGLYCERIN 0.2 MG/ML ON CALL CATH LAB
INTRAVENOUS | Status: AC
  Filled 2012-07-05: qty 1

## 2012-07-05 MED ORDER — SODIUM CHLORIDE 0.9 % IV SOLN
INTRAVENOUS | Status: AC
  Administered 2012-07-05: 11:00:00 via INTRAVENOUS

## 2012-07-05 NOTE — CV Procedure (Signed)
      Catheterization   Indication:  Chest Pain  Procedure: After informed consent and clinical "time out" the right groin was prepped and draped in a sterile fashion.  A 5Fr sheath was placed in the right femoral artery using seldinger technique and local lidocaine.  Standard JL4, JR4 and angled pigtail catheters were used to engage the coronary arteries.  Coronary arteries were visualized in orthogonal views using caudal and cranial angulation.  RAO ventriculography was done using 28* cc of contrast.    Medications:   Versed: 2 mg's  Fentanyl: 0  ug's  Coronary Arteries: Right dominant with no anomalies  LM: Normal  LAD: 70-80% lesion at first septal perforator distal vessel is small and there  Is competitive flow from LIMA      D1: small and normal   Circumflex: normal   OM1: Normal  RCA: Normal proximal and mid  30% distal discrete lesion   PDA: Normal  PLA: Normal  LIMA:  Patent to the LAD with cometitive flow that limits distal LAD filling  Ventriculography: EF: 70% %, hyperdynamic  Hemodynamics:  Aortic Pressure: 127 / 66 mmHg  LV Pressure: 134 12  mmHg  Impression:  Patent LIMA to small LAD.  No new disease in RCA or circumflex  Normal EF  Chest pain not likely cardiac  D/C home latter today if stable. Mynx used for arteriotomy with good hemostasis  Charlton Haws 07/05/2012 10:45 AM

## 2012-07-05 NOTE — Progress Notes (Signed)
Utilization Review Completed.  

## 2012-07-05 NOTE — Progress Notes (Signed)
DC IV, DC Tele, DC Homescharge instructions and home medications discussed with patient and patient's daughter. Patient and daughter denied any questions or concerns at this time. Patient leaving unit ambulatory and appears in no acute distress.

## 2012-07-05 NOTE — Progress Notes (Signed)
ANTICOAGULATION CONSULT NOTE - Follow Up Consult  Pharmacy Consult for heparin Indication: chest pain/ACS  Allergies  Allergen Reactions  . Ceclor (Cefaclor) Other (See Comments)    Reaction unknown  . Crestor (Rosuvastatin Calcium) Other (See Comments)    Reaction unknown  . Erythromycin Other (See Comments)    Reaction unknown  . Lipitor (Atorvastatin Calcium) Other (See Comments)    Reaction unknown  . Penicillins Other (See Comments)    Reaction unknown  . Sulfur Other (See Comments)    Reaction unknown  . Tetracyclines & Related Other (See Comments)    Reaction unknown  . Latex Rash    Patient Measurements: Height: 5\' 2"  (157.5 cm) Weight: 207 lb 10.8 oz (94.2 kg) IBW/kg (Calculated) : 50.1  Heparin Dosing Weight: 70.4 kg  Vital Signs: Temp: 97.3 F (36.3 C) (10/25 0406) Temp src: Oral (10/25 0406) BP: 110/63 mmHg (10/25 0406) Pulse Rate: 58  (10/25 0406)  Labs:  Basename 07/05/12 0327 07/04/12 2233 07/04/12 1415  HGB 10.8* -- 12.4  HCT 32.8* -- 37.5  PLT 197 -- 246  APTT -- -- --  LABPROT 14.2 -- --  INR 1.11 -- --  HEPARINUNFRC 0.36 -- --  CREATININE -- -- 1.00  CKTOTAL -- -- --  CKMB -- -- --  TROPONINI -- <0.30 --    Estimated Creatinine Clearance: 59.9 ml/min (by C-G formula based on Cr of 1).   Medications:  Scheduled:    . aspirin  324 mg Oral NOW   Or  . aspirin  300 mg Rectal NOW  . aspirin  324 mg Oral Pre-Cath  . aspirin EC  81 mg Oral Daily  . aspirin  325 mg Oral Once  . heparin  4,000 Units Intravenous Once  . influenza  inactive virus vaccine  0.5 mL Intramuscular Tomorrow-1000  . lisinopril  10 mg Oral Daily  . metoprolol  75 mg Oral BID  . nitroGLYCERIN  0.5 inch Topical Q6H  . omega-3 acid ethyl esters  1 g Oral Daily  . sodium chloride  3 mL Intravenous Q12H  . vitamin B-12  1,000 mcg Oral Daily  . DISCONTD: aspirin  81 mg Oral Daily   Infusions:    . sodium chloride    . heparin 900 Units/hr (07/04/12 2031)     Assessment: 66 yo female with ACS is currently on therapeutic heparin.  Heparin level 0.36. INR 1.11 Goal of Therapy:  Heparin level 0.3-0.7 units/ml Monitor platelets by anticoagulation protocol: Yes   Plan:  1) Continue heparin at 900 units/hr.  2) Confirm result at 0900 today.  Alson Mcpheeters, Tsz-Yin 07/05/2012,4:17 AM

## 2012-07-05 NOTE — Interval H&P Note (Signed)
History and Physical Interval Note:  07/05/2012 8:56 AM  Samantha Short  has presented today for surgery, with the diagnosis of cp  The various methods of treatment have been discussed with the patient and family. After consideration of risks, benefits and other options for treatment, the patient has consented to  Procedure(s) (LRB) with comments: LEFT HEART CATHETERIZATION WITH CORONARY/GRAFT ANGIOGRAM (N/A) as a surgical intervention .  The patient's history has been reviewed, patient examined, no change in status, stable for surgery.  I have reviewed the patient's chart and labs.  Questions were answered to the patient's satisfaction.     Charlton Haws

## 2012-07-05 NOTE — Progress Notes (Signed)
Patient ID: Samantha Short, female   DOB: 1946-09-09, 66 y.o.   MRN: [id number]    Subjective:  Denies SSCP, palpitations or Dyspnea Headache nausea and emesis x 1 this a.m.   Objective:  Filed Vitals:   07/04/12 2133 07/04/12 2203 07/05/12 0406 07/05/12 0635  BP:  168/75 110/63 162/74  Pulse:  53 58 57  Temp: 98.4 F (36.9 C) 97.5 F (36.4 C) 97.3 F (36.3 C) 98.2 F (36.8 C)  TempSrc:  Oral Oral Oral  Resp:  18 18 17   Height:  5\' 2"  (1.575 m)    Weight:  208 lb (94.348 kg) 207 lb 10.8 oz (94.2 kg)   SpO2:  100% 100% 98%    Intake/Output from previous day: No intake or output data in the 24 hours ending 07/05/12 0805  Physical Exam: Affect appropriate Healthy:  appears stated age HEENT: normal Neck supple with no adenopathy JVP normal no bruits no thyromegaly Lungs clear with no wheezing and good diaphragmatic motion Heart:  S1/S2 no murmur, no rub, gallop or click PMI normal  S/P sternotomy Abdomen: benighn, BS positve, no tenderness, no AAA no bruit.  No HSM or HJR Distal pulses intact with no bruits No edema Neuro non-focal Skin warm and dry No muscular weakness   Lab Results: Basic Metabolic Panel:  Basename 07/05/12 0327 07/04/12 1415  NA 144 142  K 3.9 3.9  CL 110 106  CO2 26 27  GLUCOSE 87 117*  BUN 13 15  CREATININE 0.91 1.00  CALCIUM 8.9 9.6  MG -- --  PHOS -- --   Liver Function Tests:  Basename 07/05/12 0327  AST 19  ALT 18  ALKPHOS 50  BILITOT 0.3  PROT 5.9*  ALBUMIN 3.0*   No results found for this basename: LIPASE:2,AMYLASE:2 in the last 72 hours CBC:  Basename 07/05/12 0327 07/04/12 1415  WBC 8.4 9.5  NEUTROABS -- --  HGB 10.8* 12.4  HCT 32.8* 37.5  MCV 88.2 89.3  PLT 197 246   Cardiac Enzymes:  Basename 07/05/12 0328 07/04/12 2233  CKTOTAL -- --  CKMB -- --  CKMBINDEX -- --  TROPONINI <0.30 <0.30   BNP: No components found with this basename: POCBNP:3 D-Dimer:  [facility] 07/04/12 2235  DDIMER 0.61*    Hemoglobin A1C: No results found for this basename: HGBA1C in the last 72 hours Fasting Lipid Panel:  Basename 07/05/12 0332  CHOL 152  HDL 54  LDLCALC 88  TRIG 52  CHOLHDL 2.8  LDLDIRECT --   Thyroid Function Tests: No results found for this basename: TSH,T4TOTAL,FREET3,T3FREE,THYROIDAB in the last 72 hours Anemia Panel: No results found for this basename: VITAMINB12,FOLATE,FERRITIN,TIBC,IRON,RETICCTPCT in the last 72 hours  Imaging: Dg Chest 2 View  07/04/2012  *RADIOLOGY REPORT*  Clinical Data: 66 year old female shortness of breath chest pressure.  CHEST - 2 VIEW  Comparison: 03/11.  Findings: Stable sequelae of CABG.  Cardiac size and mediastinal contours are within normal limits.  Slightly lower lung volumes. Visualized tracheal air column is within normal limits.  Scattered calcified granulomas in both lungs are stable.  No pneumothorax. No pleural effusion or pulmonary edema.  No consolidation or confluent pulmonary opacity. No acute osseous abnormality identified.  IMPRESSION: Lower lung volumes, otherwise no acute cardiopulmonary abnormality.   Original Report Authenticated By: Harley Hallmark, M.D.     Cardiac Studies:  ECG:  NSR no acute ischemic changes   Telemetry:  NSR no VT or arrhythmia  Echo:  2012  EF 55%  Medications:     . aspirin  324 mg Oral NOW   Or  . aspirin  300 mg Rectal NOW  . aspirin  324 mg Oral Pre-Cath  . aspirin EC  81 mg Oral Daily  . aspirin  325 mg Oral Once  . heparin  4,000 Units Intravenous Once  . influenza  inactive virus vaccine  0.5 mL Intramuscular Tomorrow-1000  . lisinopril  10 mg Oral Daily  . metoprolol  75 mg Oral BID  . nitroGLYCERIN  0.5 inch Topical Q6H  . omega-3 acid ethyl esters  1 g Oral Daily  . sodium chloride  3 mL Intravenous Q12H  . vitamin B-12  1,000 mcg Oral Daily  . DISCONTD: aspirin  81 mg Oral Daily       . sodium chloride    . heparin 900 Units/hr (07/04/12 2031)    Assessment/Plan:  HTN:   Increase lisinopril  Low salt diet continue beta blocker Diastolic CHF:  Mild previously normal EF will see what EDP is with cath Chest Pain:  S/P LIMA to LAD  R/O plan cath per Dr Dietrich Pates.  Will reevaluate latter this a.m to see how nausea And headache are before proceding  Charlton Haws 07/05/2012, 8:05 AM

## 2012-07-05 NOTE — Discharge Summary (Signed)
CARDIOLOGY DISCHARGE SUMMARY   Patient ID: Samantha Short MRN: [id number] DOB/AGE: August 30, 1946 66 y.o.  Admit date: 07/04/2012 Discharge date: 07/09/2012  Primary Discharge Diagnosis:  Chest pain mid-sternal, no critical coronary artery disease by cath, medical therapy recommended Secondary Discharge Diagnosis Hyperlipidemia  Hypertension  Acid reflux  Procedures: Cardiac catheterization, coronary arteriogram, left ventriculogram  Hospital Course: Ms Samantha Short is a 66 year old female with a history of CAD. She had chest pain and came to the hospital, where she was admitted for further evaluation and treatment.  Her cardiac enzymes were negative for MI. She has a history of anemia and her blood counts dropped a little with blood draws and procedures. This can be followed as an outpatient. Her symptoms were concerning, so she was taken to the cath lab on 07/05/2012. Full results are below, but medical therapy was indicated, no new lesions needing PCI noted. Post-procedure, she was ambulating without chest pain or SOB and considered stable for discharge, to follow up as an outpatient.  Labs:   Lab Results  Component Value Date   WBC 8.4 07/05/2012   HGB 10.8* 07/05/2012   HCT 32.8* 07/05/2012   MCV 88.2 07/05/2012   PLT 197 07/05/2012     Lab 07/05/12 0327  NA 144  K 3.9  CL 110  CO2 26  BUN 13  CREATININE 0.91  CALCIUM 8.9  PROT 5.9*  BILITOT 0.3  ALKPHOS 50  ALT 18  AST 19  GLUCOSE 87   No results found for this basename: CKTOTAL:3,CKMB:3,CKMBINDEX:3,TROPONINI:3 in the last 72 hours Lipid Panel     Component Value Date/Time   CHOL 152 07/05/2012 0332   TRIG 52 07/05/2012 0332   HDL 54 07/05/2012 0332   CHOLHDL 2.8 07/05/2012 0332   VLDL 10 07/05/2012 0332   LDLCALC 88 07/05/2012 0332    Pro B Natriuretic peptide (BNP)  Date/Time Value Range Status  07/04/2012  2:16 PM 1172.0* 0 - 125 pg/mL Final   No results found for this basename: INR in the last 72  hours    Radiology: Dg Chest 2 View 07/04/2012  *RADIOLOGY REPORT*  Clinical Data: 67 year old female shortness of breath chest pressure.  CHEST - 2 VIEW  Comparison: 03/11.  Findings: Stable sequelae of CABG.  Cardiac size and mediastinal contours are within normal limits.  Slightly lower lung volumes. Visualized tracheal air column is within normal limits.  Scattered calcified granulomas in both lungs are stable.  No pneumothorax. No pleural effusion or pulmonary edema.  No consolidation or confluent pulmonary opacity. No acute osseous abnormality identified.  IMPRESSION: Lower lung volumes, otherwise no acute cardiopulmonary abnormality.   Original Report Authenticated By: Harley Hallmark, M.D.     EKG: 05-Jul-2012 08:28:06 [facility] ROUTINE RECORD Sinus bradycardia Right bundle branch block Abnormal ECG 41mm/s 38mm/mV 100Hz  8.0.1 12SL 239 CID: 20 Referred by: Unconfirmed Vent. rate 52 BPM PR interval 150 ms QRS duration 122 ms QT/QTc 472/438 ms P-R-T axes 58 66 36  FOLLOW UP PLANS AND APPOINTMENTS Allergies  Allergen Reactions  . Ceclor (Cefaclor) Other (See Comments)    Reaction unknown  . Crestor (Rosuvastatin Calcium) Other (See Comments)    Reaction unknown  . Erythromycin Other (See Comments)    Reaction unknown  . Lipitor (Atorvastatin Calcium) Other (See Comments)    Reaction unknown  . Penicillins Other (See Comments)    Reaction unknown  . Sulfur Other (See Comments)    Reaction unknown  . Tetracyclines &  Related Other (See Comments)    Reaction unknown  . Latex Rash     Medication List     As of 07/09/2012 12:13 PM    TAKE these medications         aspirin 81 MG tablet   Take 81 mg by mouth daily.      b complex vitamins tablet   Take 1 tablet by mouth daily.      Fish Oil 300 MG Caps   Take 2 capsules by mouth daily.      lisinopril 10 MG tablet   Commonly known as: PRINIVIL,ZESTRIL   Take 1 tablet (10 mg total) by mouth daily.       metoprolol 50 MG tablet   Commonly known as: LOPRESSOR   Take 75 mg by mouth 2 (two) times daily.      nitroGLYCERIN 0.4 MG SL tablet   Commonly known as: NITROSTAT   Place 0.4 mg under the tongue every 5 (five) minutes as needed.      OVER THE COUNTER MEDICATION   Take 1 application by mouth daily as needed. For rosacea   prosacea      vitamin B-12 1000 MCG tablet   Commonly known as: CYANOCOBALAMIN   Take 1,000 mcg by mouth daily.         Discharge Orders    Future Appointments: Provider: Department: Dept Phone: Center:   07/23/2012 8:50 AM Beatrice Lecher, PA [facility] [phone] [facility]     Future Orders Please Complete By Expires   Diet - low sodium heart healthy      Increase activity slowly        Follow-up Information    Follow up with Tereso Newcomer, PA. On 07/23/2012. (see for Dr Clifton James at 08:50 am.)    Contact information:   [address] [place] [address] [phone]          BRING ALL MEDICATIONS WITH YOU TO FOLLOW UP APPOINTMENTS  Time spent with patient to include physician time: 35 min Signed: Theodore Demark 07/09/2012, 12:13 PM Co-Sign MD

## 2012-07-23 ENCOUNTER — Ambulatory Visit (INDEPENDENT_AMBULATORY_CARE_PROVIDER_SITE_OTHER): Payer: BC Managed Care – PPO | Admitting: Physician Assistant

## 2012-07-23 ENCOUNTER — Encounter: Payer: Self-pay | Admitting: Physician Assistant

## 2012-07-23 VITALS — BP 154/78 | HR 53 | Ht 64.0 in | Wt 205.6 lb

## 2012-07-23 DIAGNOSIS — I2510 Atherosclerotic heart disease of native coronary artery without angina pectoris: Secondary | ICD-10-CM

## 2012-07-23 DIAGNOSIS — R0789 Other chest pain: Secondary | ICD-10-CM

## 2012-07-23 DIAGNOSIS — E785 Hyperlipidemia, unspecified: Secondary | ICD-10-CM

## 2012-07-23 DIAGNOSIS — R072 Precordial pain: Secondary | ICD-10-CM

## 2012-07-23 DIAGNOSIS — I10 Essential (primary) hypertension: Secondary | ICD-10-CM

## 2012-07-23 MED ORDER — LISINOPRIL 20 MG PO TABS: 20.0000 mg | ORAL_TABLET | Freq: Every day | ORAL | Status: DC

## 2012-07-23 NOTE — Patient Instructions (Addendum)
Your physician has recommended you make the following change in your medication: INCREASE LISINOPRIL TO 20 MG DAILY; A NEW PRESCRIPTION WAS SENT IN TODAY TO [facility] X 3  YOU HAVE BEEN GIVEN A PRESCRIPTION TO TAKE TO YOUR PRIMARY CARE PHYSICIAN TO HAVE LAB WORK DONE IN 2 WEEKS WITH RESULTS TO BE FAXED TO [facility], [facility]  Your physician wants you to follow-up in: 6 MONTHS WITH DR. Clifton James. You will receive a reminder letter in the mail two months in advance. If you don't receive a letter, please call our office to schedule the follow-up appointment.

## 2012-07-23 NOTE — Progress Notes (Signed)
[address], [address] [place], [place]  [address] Phone: [phone], Fax:  [phone]  Date:  07/23/2012   Name:  Samantha Short   DOB:  31-Dec-1945   MRN:  [id number]  PCP:  Teena Irani, PA  Primary Cardiologist:  Dr. Verne Carrow  Primary Electrophysiologist:  None    History of Present Illness: Samantha Short is a 66 y.o. female who returns for follow up after recent hospitalization for chest pain.  She has a hx of CAD, s/p 1V CABG in 2010, HTN, HL, GERD. She was admitted 10/24-10/29. She ruled out for MI by serial cardiac enzymes. She underwent cardiac catheterization. LHC 07/04/12: LAD 70-80%, mid RCA 30%, LIMA-LAD patent with competitive flow limiting distal LAD filling, EF 70% with rhypedynamic LV function. Medical therapy continued.  Labs (10/13):    K 3.9, creatinine 0.91, ALT 18, LDL 88, Hgb 10.8, TSH 0.908  Wt Readings from Last 3 Encounters:  07/23/12 205 lb 9.6 oz (93.26 kg)  07/05/12 207 lb 10.8 oz (94.2 kg)  07/05/12 207 lb 10.8 oz (94.2 kg)     Past Medical History  Diagnosis Date  . Coronary artery disease     a. s/p CABG x 1 2010:  LIMA->LAD.;  b. amdx for CP => LHC 07/04/12: LAD 70-80%, mid RCA 30%, LIMA-LAD patent with competitive flow limiting distal LAD filling, EF 70% with rhypedynamic LV function. Medical therapy continued.  . Hyperlipidemia   . Hypertension   . Chronic diastolic CHF (congestive heart failure)     a. 02/2011 Echo: EF 55-60%, Gr2 DD, Mild MR  . GERD (gastroesophageal reflux disease)   . Dyslipidemia   . Mitral regurgitation     a. mild by echo 02/2011.  . Asthma     Current Outpatient Prescriptions  Medication Sig Dispense Refill  . aspirin 81 MG tablet Take 81 mg by mouth daily.      Marland Kitchen b complex vitamins tablet Take 1 tablet by mouth daily.        Marland Kitchen lisinopril (PRINIVIL,ZESTRIL) 10 MG tablet Take 1 tablet (10 mg total) by mouth daily.  90 tablet  3  . metoprolol (LOPRESSOR) 50 MG tablet Take 75 mg by  mouth 2 (two) times daily.      . nitroGLYCERIN (NITROSTAT) 0.4 MG SL tablet Place 0.4 mg under the tongue every 5 (five) minutes as needed.      . Omega-3 Fatty Acids (FISH OIL) 300 MG CAPS Take 2 capsules by mouth daily.       Marland Kitchen OVER THE COUNTER MEDICATION Apply 1 application topically daily as needed. For rosacea   prosacea      . [DISCONTINUED] nitroGLYCERIN (NITROSTAT) 0.4 MG SL tablet Place 1 tablet (0.4 mg total) under the tongue every 5 (five) minutes as needed for chest pain.  25 tablet  6    Allergies: Allergies  Allergen Reactions  . Ceclor (Cefaclor) Other (See Comments)    Reaction unknown  . Crestor (Rosuvastatin Calcium) Other (See Comments)    Reaction unknown  . Erythromycin Other (See Comments)    Reaction unknown  . Lipitor (Atorvastatin Calcium) Other (See Comments)    Reaction unknown  . Norvasc (Amlodipine)   . Penicillins Other (See Comments)    Reaction unknown  . Sulfur Other (See Comments)    Reaction unknown  . Tetracyclines & Related Other (See Comments)    Reaction unknown  . Latex Rash    Social History:  The patient  reports  that she quit smoking about 2 years ago. Her smoking use included Cigarettes. She quit after 7 years of use. She has never used smokeless tobacco. She reports that she does not drink alcohol or use illicit drugs.   ROS:  Please see the history of present illness.   All other systems reviewed and negative.   PHYSICAL EXAM: VS:  BP 154/78  Pulse 53  Ht 5\' 4"  (1.626 m)  Wt 205 lb 9.6 oz (93.26 kg)  BMI 35.29 kg/m2 Well nourished, well developed, in no acute distress HEENT: normal Neck: no JVD Cardiac:  normal S1, S2; RRR; no murmur Lungs:  clear to auscultation bilaterally, no wheezing, rhonchi or rales Abd: soft, nontender, no hepatomegaly Ext: no edema; right groin without pulsatile mass or bruit  Skin: warm and dry Neuro:  CNs 2-12 intact, no focal abnormalities noted  EKG:  Sinus brady, HR 53, RBBB, no change from  prior tracing.      ASSESSMENT AND PLAN:  1. Chest Pain:   Seems more MSK.  She is point tender over her left chest.  This has persisted since her CABG.  We discussed conservative measures such as avoiding heavy lifting and using heat.  She may use tylenol prn.  She can use Ibuprofen prn.  I counseled her on using this sparingly and why.  2. Coronary Artery Disease:   Patent L-LAD by recent LHC.  Continue ASA.  Follow up with Dr. Verne Carrow in 6 mos.  3. Hypertension:   Uncontrolled.  Increase Lisinopril to 20 mg QD.  Check BMET in 2 weeks.  4. Hyperlipidemia:   She is statin intolerant.  We discussed trying Pravastatin at 3 x a week, but she prefers to hold off for now.  Luna Glasgow, PA-C  9:37 AM 07/23/2012

## 2012-08-13 ENCOUNTER — Telehealth: Payer: Self-pay | Admitting: Cardiovascular Disease

## 2012-08-13 NOTE — Telephone Encounter (Signed)
New Problem:    Patient called in to see what the status was for her surgical clearance for her sugery on 08/15/12.  Please call back.   Patient also needs a refill of her nitroGLYCERIN (NITROSTAT) 0.4 MG SL tablet.

## 2012-08-13 NOTE — Telephone Encounter (Signed)
pt notified about surg clearance faxed tonight to [facility], pt verbalized understanding

## 2012-08-13 NOTE — Telephone Encounter (Signed)
pt notified about surg clearance faxed tonight to [facility], pt verbalized understanding  

## 2012-08-13 NOTE — Telephone Encounter (Addendum)
Walk In pt Form " [facility]" paper Dropped  Off For Completion, Sent to Carol/Weaver  08/13/12/KM

## 2012-10-14 ENCOUNTER — Other Ambulatory Visit: Payer: Self-pay | Admitting: *Deleted

## 2012-10-14 ENCOUNTER — Telehealth: Payer: Self-pay | Admitting: Cardiovascular Disease

## 2012-10-14 MED ORDER — METOPROLOL TARTRATE 50 MG PO TABS: 75.0000 mg | ORAL_TABLET | Freq: Two times a day (BID) | ORAL | Status: DC

## 2012-10-14 NOTE — Telephone Encounter (Signed)
Pt  needs refill of metoprolol to  [facility]

## 2012-11-13 ENCOUNTER — Encounter (INDEPENDENT_AMBULATORY_CARE_PROVIDER_SITE_OTHER): Payer: Medicare Other | Admitting: Ophthalmology

## 2012-11-13 DIAGNOSIS — H43819 Vitreous degeneration, unspecified eye: Secondary | ICD-10-CM

## 2012-11-13 DIAGNOSIS — H26499 Other secondary cataract, unspecified eye: Secondary | ICD-10-CM

## 2012-11-13 DIAGNOSIS — H3530 Unspecified macular degeneration: Secondary | ICD-10-CM

## 2012-11-13 DIAGNOSIS — I10 Essential (primary) hypertension: Secondary | ICD-10-CM

## 2012-11-13 DIAGNOSIS — H35039 Hypertensive retinopathy, unspecified eye: Secondary | ICD-10-CM

## 2013-01-13 ENCOUNTER — Encounter (INDEPENDENT_AMBULATORY_CARE_PROVIDER_SITE_OTHER): Payer: Medicare Other | Admitting: Ophthalmology

## 2013-01-13 DIAGNOSIS — I10 Essential (primary) hypertension: Secondary | ICD-10-CM

## 2013-01-13 DIAGNOSIS — H26499 Other secondary cataract, unspecified eye: Secondary | ICD-10-CM

## 2013-01-13 DIAGNOSIS — H35039 Hypertensive retinopathy, unspecified eye: Secondary | ICD-10-CM

## 2013-01-13 DIAGNOSIS — H43819 Vitreous degeneration, unspecified eye: Secondary | ICD-10-CM

## 2013-01-13 DIAGNOSIS — H3530 Unspecified macular degeneration: Secondary | ICD-10-CM

## 2013-06-13 ENCOUNTER — Encounter: Payer: Medicare Other | Admitting: Cardiovascular Disease

## 2013-06-13 ENCOUNTER — Telehealth: Payer: Self-pay | Admitting: Cardiovascular Disease

## 2013-06-13 DIAGNOSIS — I10 Essential (primary) hypertension: Secondary | ICD-10-CM

## 2013-06-13 NOTE — Telephone Encounter (Deleted)
Error

## 2013-06-13 NOTE — Progress Notes (Signed)
No show

## 2013-06-13 NOTE — Telephone Encounter (Signed)
Pt had scheduled appt today with Dr.McAlhany. Per check in pt arrived in office and when they went to check her in and collect insurance information  pt left office. No explanation given by pt. I placed call to pt to check on her and see if she would like to reschedule appt. Left message for pt to call back.

## 2013-06-26 MED ORDER — LISINOPRIL 20 MG PO TABS: 20.0000 mg | ORAL_TABLET | Freq: Every day | ORAL | Status: DC

## 2013-06-26 NOTE — Telephone Encounter (Signed)
Spoke with Samantha Short. She reports she became upset when she was checking in for appt. She had requested call from business services manager but has not heard back and reports she does not need call back at this time. She is feeling OK but reports she does not like going to see doctors. She would like to reschedule appt with Dr. Clifton James but wants to wait until after the first of the year. She will call back to schedule this in a few weeks as we do not have schedule for January yet.  Needs refill of Lisinopril sent to [facility]. I told her I would send in a 90 day supply but she would need to schedule appt for further refills.

## 2013-07-02 ENCOUNTER — Encounter: Payer: Self-pay | Admitting: Cardiovascular Disease

## 2013-07-30 ENCOUNTER — Encounter: Payer: Self-pay | Admitting: Physician Assistant

## 2013-07-30 ENCOUNTER — Ambulatory Visit (INDEPENDENT_AMBULATORY_CARE_PROVIDER_SITE_OTHER): Payer: Medicare Other | Admitting: Physician Assistant

## 2013-07-30 ENCOUNTER — Encounter (INDEPENDENT_AMBULATORY_CARE_PROVIDER_SITE_OTHER): Payer: Self-pay

## 2013-07-30 VITALS — BP 144/92 | HR 64 | Ht 64.0 in | Wt 208.0 lb

## 2013-07-30 DIAGNOSIS — E785 Hyperlipidemia, unspecified: Secondary | ICD-10-CM

## 2013-07-30 DIAGNOSIS — I10 Essential (primary) hypertension: Secondary | ICD-10-CM

## 2013-07-30 DIAGNOSIS — I2510 Atherosclerotic heart disease of native coronary artery without angina pectoris: Secondary | ICD-10-CM

## 2013-07-30 LAB — BASIC METABOLIC PANEL
BUN: 15 mg/dL (ref 6–23)
CO2: 27 mEq/L (ref 19–32)
Chloride: 106 mEq/L (ref 96–112)
Creatinine, Ser: 0.9 mg/dL (ref 0.4–1.2)

## 2013-07-30 LAB — HEPATIC FUNCTION PANEL
ALT: 24 U/L (ref 0–35)
Total Protein: 7.1 g/dL (ref 6.0–8.3)

## 2013-07-30 MED ORDER — LISINOPRIL 20 MG PO TABS: 20.0000 mg | ORAL_TABLET | Freq: Every day | ORAL | Status: DC

## 2013-07-30 MED ORDER — NITROGLYCERIN 0.4 MG SL SUBL: 0.4000 mg | SUBLINGUAL_TABLET | SUBLINGUAL | Status: DC | PRN

## 2013-07-30 MED ORDER — PRAVASTATIN SODIUM 10 MG PO TABS: 10.0000 mg | ORAL_TABLET | ORAL | Status: DC

## 2013-07-30 MED ORDER — METOPROLOL TARTRATE 50 MG PO TABS: 75.0000 mg | ORAL_TABLET | Freq: Two times a day (BID) | ORAL | Status: DC

## 2013-07-30 NOTE — Patient Instructions (Signed)
A REFILL FOR NTG HAS BEEN SENT IN TODAY FOR YOU.  Your physician wants you to follow-up in: 1 YEAR WITH DR. Clifton James. You will receive a reminder letter in the mail two months in advance. If you don't receive a letter, please call our office to schedule the follow-up appointment.  LABS TODAY; BMET, LFT

## 2013-07-30 NOTE — Progress Notes (Signed)
[address] [place], [place]  [address] Phone: [phone] Fax:  [phone]  Date:  07/30/2013   ID:  Samantha Short, DOB [birth year], MRN [id number]  PCP:  Teena Irani, PA-C  Cardiologist:  Dr. Verne Carrow     History of Present Illness: Samantha Short is a 67 y.o. female a hx of CAD, s/p 1V CABG in 2010, HTN, HL, GERD. She was admitted 06/2012 with chest pain and ruled out for MI.  LHC 07/04/12: LAD 70-80%, mid RCA 30%, LIMA-LAD patent with competitive flow limiting distal LAD filling, EF 70% with hyperdynamic LV function. Medical therapy was continued.  Last seen 07/2012.  Since last seen she is doing well.  She swims several times a week.  The patient denies chest pain, shortness of breath, syncope, orthopnea, PND or significant pedal edema.   Recent Labs: No results found for requested labs within last 365 days. 07/05/2012: K 3.9, creatinine 0.91, ALT 18, LDL 88, Hgb 10.8, TSH 0.908  Wt Readings from Last 3 Encounters:  07/30/13 208 lb (94.348 kg)  07/23/12 205 lb 9.6 oz (93.26 kg)  07/05/12 207 lb 10.8 oz (94.2 kg)     Past Medical History  Diagnosis Date  . Coronary artery disease     a. s/p CABG x 1 2010:  LIMA->LAD.;  b. amdx for CP => LHC 07/04/12: LAD 70-80%, mid RCA 30%, LIMA-LAD patent with competitive flow limiting distal LAD filling, EF 70% with hyperdynamic LV function. Medical therapy continued.  . Hyperlipidemia   . Hypertension   . Chronic diastolic CHF (congestive heart failure)     a. 02/2011 Echo: EF 55-60%, Gr2 DD, Mild MR  . GERD (gastroesophageal reflux disease)   . Dyslipidemia   . Mitral regurgitation     a. mild by echo 02/2011.  . Asthma     Current Outpatient Prescriptions  Medication Sig Dispense Refill  . aspirin 81 MG tablet Take 81 mg by mouth daily.      Marland Kitchen esomeprazole (NEXIUM) 40 MG capsule Take 40 mg by mouth daily at 12 noon.      Marland Kitchen lisinopril (PRINIVIL,ZESTRIL) 20 MG tablet Take 1 tablet (20 mg total) by  mouth daily.  90 tablet  0  . metoprolol (LOPRESSOR) 50 MG tablet Take 1.5 tablets (75 mg total) by mouth 2 (two) times daily.  270 tablet  3  . Multiple Vitamins-Minerals (PRESERVISION AREDS 2 PO) Take 1 capsule by mouth 2 (two) times daily.      . Omega-3 Fatty Acids (FISH OIL PO) Take 1,200 mg by mouth 2 (two) times daily.      . pravastatin (PRAVACHOL) 10 MG tablet Take 10 mg by mouth every other day.       No current facility-administered medications for this visit.    Allergies:   Ceclor; Crestor; Erythromycin; Lipitor; Norvasc; Penicillins; Sulfur; Tetracyclines & related; and Latex   Social History:  The patient  reports that she quit smoking about 3 years ago. Her smoking use included Cigarettes. She smoked 0.00 packs per day for 7 years. She has never used smokeless tobacco. She reports that she does not drink alcohol or use illicit drugs.   Family History:  The patient's family history includes Angina in her sister; Cancer in her brother, father, and sister; Coronary artery disease in her son; Heart attack in her father and mother; Heart disease in her brother, mother, and sister; Hypertension in her brother and sister.   ROS:  Please  see the history of present illness.  She has noted darker urine since starting Pravastatin.  Also takes a vitamin that is red in color for her eyes.   All other systems reviewed and negative.   PHYSICAL EXAM: VS:  BP 144/92  Pulse 64  Ht 5\' 4"  (1.626 m)  Wt 208 lb (94.348 kg)  BMI 35.69 kg/m2  SpO2 97% Well nourished, well developed, in no acute distress HEENT: normal Neck: no JVD Vascular:  No carotid bruits Cardiac:  normal S1, S2; RRR; no murmur Lungs:  clear to auscultation bilaterally, no wheezing, rhonchi or rales Abd: soft, nontender, no hepatomegaly Ext: no edema Skin: warm and dry Neuro:  CNs 2-12 intact, no focal abnormalities noted  EKG:  Reviewed ECG from PCP office (06/17/13) - NSR, HR 56, RBBB (no changes)     ASSESSMENT AND  PLAN:  1. CAD:  No angina.  Continue ASA and statin.  2. Hypertension:  Borderline control.  I have asked her to watch her BPs at home and let us know if it is running high.  Check BMET today. 3. Hyperlipidemia:  Will check LFTs given urine color change.  She will get Lipids checked with her PCP (not fasting today).  4. Disposition:  F/u with Dr. Verne Carrow in 1 year or sooner PRN.  Signed, Tereso Newcomer, PA-C  07/30/2013 9:18 AM

## 2013-07-31 ENCOUNTER — Telehealth: Payer: Self-pay | Admitting: Cardiovascular Disease

## 2013-07-31 NOTE — Telephone Encounter (Signed)
Follow Up: ° ° ° °Pt returning call for results.  °

## 2013-07-31 NOTE — Telephone Encounter (Signed)
Spoke with pt and reviewed lab results with her.

## 2013-09-16 ENCOUNTER — Encounter: Payer: Self-pay | Admitting: Cardiovascular Disease

## 2013-10-04 ENCOUNTER — Telehealth: Payer: Self-pay | Admitting: Physician Assistant

## 2013-10-04 NOTE — Telephone Encounter (Signed)
Patient called in with symptoms of markedly elevated BPs and chest pain. She has a hx of CABG and had patent L-LAD at [facility] in 06/2012.  Last seen in 07/2013 and had no CP with daily exercise (swimming). Husband just passed away.  Under a lot of stress. She notes progressively worsening exertional chest tightness over the past 1-2 weeks. No assoc symptoms.  No syncope. BP yesterday in PCP office 190/115.  Reduced to 150/80 at d/c. She had 10 mins of CP at rest this AM. [facility] at [facility] and checked her BP.  It is 190/80. I have advised her to come [facility] for evaluation. Signed, swallowing

## 2013-10-06 ENCOUNTER — Emergency Department (HOSPITAL_COMMUNITY): Payer: Medicare Other

## 2013-10-06 ENCOUNTER — Ambulatory Visit: Payer: Medicare Other | Admitting: Physician Assistant

## 2013-10-06 ENCOUNTER — Emergency Department (HOSPITAL_COMMUNITY)
Admission: EM | Admit: 2013-10-06 | Discharge: 2013-10-06 | Disposition: A | Discharge status: Home / Self Care | Payer: Medicare Other | Attending: Emergency Medicine | Admitting: Emergency Medicine

## 2013-10-06 ENCOUNTER — Telehealth: Payer: Self-pay | Admitting: Cardiovascular Disease

## 2013-10-06 ENCOUNTER — Encounter (HOSPITAL_COMMUNITY): Payer: Self-pay | Admitting: Emergency Medicine

## 2013-10-06 DIAGNOSIS — R079 Chest pain, unspecified: Secondary | ICD-10-CM

## 2013-10-06 DIAGNOSIS — Z87891 Personal history of nicotine dependence: Secondary | ICD-10-CM | POA: Insufficient documentation

## 2013-10-06 DIAGNOSIS — I10 Essential (primary) hypertension: Secondary | ICD-10-CM

## 2013-10-06 DIAGNOSIS — R110 Nausea: Secondary | ICD-10-CM | POA: Insufficient documentation

## 2013-10-06 DIAGNOSIS — Z95818 Presence of other cardiac implants and grafts: Secondary | ICD-10-CM | POA: Insufficient documentation

## 2013-10-06 DIAGNOSIS — J45909 Unspecified asthma, uncomplicated: Secondary | ICD-10-CM | POA: Insufficient documentation

## 2013-10-06 DIAGNOSIS — F411 Generalized anxiety disorder: Secondary | ICD-10-CM | POA: Insufficient documentation

## 2013-10-06 DIAGNOSIS — Z951 Presence of aortocoronary bypass graft: Secondary | ICD-10-CM | POA: Insufficient documentation

## 2013-10-06 DIAGNOSIS — Z880 Allergy status to penicillin: Secondary | ICD-10-CM | POA: Insufficient documentation

## 2013-10-06 DIAGNOSIS — R450 Nervousness: Secondary | ICD-10-CM | POA: Insufficient documentation

## 2013-10-06 DIAGNOSIS — F430 Acute stress reaction: Secondary | ICD-10-CM | POA: Insufficient documentation

## 2013-10-06 DIAGNOSIS — E785 Hyperlipidemia, unspecified: Secondary | ICD-10-CM | POA: Insufficient documentation

## 2013-10-06 DIAGNOSIS — I2510 Atherosclerotic heart disease of native coronary artery without angina pectoris: Secondary | ICD-10-CM | POA: Insufficient documentation

## 2013-10-06 DIAGNOSIS — I5032 Chronic diastolic (congestive) heart failure: Secondary | ICD-10-CM | POA: Insufficient documentation

## 2013-10-06 DIAGNOSIS — K219 Gastro-esophageal reflux disease without esophagitis: Secondary | ICD-10-CM | POA: Insufficient documentation

## 2013-10-06 DIAGNOSIS — Z79899 Other long term (current) drug therapy: Secondary | ICD-10-CM | POA: Insufficient documentation

## 2013-10-06 DIAGNOSIS — Z7982 Long term (current) use of aspirin: Secondary | ICD-10-CM | POA: Insufficient documentation

## 2013-10-06 LAB — CBC WITH DIFFERENTIAL/PLATELET
BASOS PCT: 0 % (ref 0–1)
Basophils Absolute: 0 10*3/uL (ref 0.0–0.1)
EOS PCT: 2 % (ref 0–5)
Eosinophils Absolute: 0.1 10*3/uL (ref 0.0–0.7)
HEMATOCRIT: 39.5 % (ref 36.0–46.0)
HEMOGLOBIN: 13.1 g/dL (ref 12.0–15.0)
Lymphocytes Relative: 32 % (ref 12–46)
Lymphs Abs: 2.9 10*3/uL (ref 0.7–4.0)
MCH: 29.7 pg (ref 26.0–34.0)
MCHC: 33.2 g/dL (ref 30.0–36.0)
MCV: 89.6 fL (ref 78.0–100.0)
MONO ABS: 0.7 10*3/uL (ref 0.1–1.0)
MONOS PCT: 7 % (ref 3–12)
NEUTROS ABS: 5.2 10*3/uL (ref 1.7–7.7)
Neutrophils Relative %: 59 % (ref 43–77)
Platelets: 240 10*3/uL (ref 150–400)
RBC: 4.41 MIL/uL (ref 3.87–5.11)
RDW: 13.6 % (ref 11.5–15.5)
WBC: 8.9 10*3/uL (ref 4.0–10.5)

## 2013-10-06 LAB — COMPREHENSIVE METABOLIC PANEL
ALBUMIN: 3.6 g/dL (ref 3.5–5.2)
ALT: 19 U/L (ref 0–35)
AST: 22 U/L (ref 0–37)
Alkaline Phosphatase: 62 U/L (ref 39–117)
BILIRUBIN TOTAL: 0.3 mg/dL (ref 0.3–1.2)
BUN: 12 mg/dL (ref 6–23)
CALCIUM: 9.3 mg/dL (ref 8.4–10.5)
CHLORIDE: 106 meq/L (ref 96–112)
CO2: 24 meq/L (ref 19–32)
CREATININE: 0.86 mg/dL (ref 0.50–1.10)
GFR calc Af Amer: 79 mL/min — ABNORMAL LOW (ref >90)
GFR, EST NON AFRICAN AMERICAN: 68 mL/min — AB (ref >90)
Glucose, Bld: 102 mg/dL — ABNORMAL HIGH (ref 70–99)
Potassium: 4.5 mEq/L (ref 3.7–5.3)
Sodium: 141 mEq/L (ref 137–147)
Total Protein: 7.2 g/dL (ref 6.0–8.3)

## 2013-10-06 LAB — POCT I-STAT TROPONIN I
TROPONIN I, POC: 0.02 ng/mL (ref 0.00–0.08)
Troponin i, poc: 0.01 ng/mL (ref 0.00–0.08)

## 2013-10-06 MED ORDER — ACETAMINOPHEN 325 MG PO TABS
650.0000 mg | ORAL_TABLET | Freq: Once | ORAL | Status: AC
  Administered 2013-10-06: 650 mg via ORAL
  Filled 2013-10-06: qty 2

## 2013-10-06 MED ORDER — ASPIRIN 81 MG PO CHEW
324.0000 mg | CHEWABLE_TABLET | Freq: Once | ORAL | Status: AC
  Administered 2013-10-06: 324 mg via ORAL
  Filled 2013-10-06: qty 4

## 2013-10-06 MED ORDER — NITROGLYCERIN 0.4 MG SL SUBL
0.4000 mg | SUBLINGUAL_TABLET | SUBLINGUAL | Status: DC | PRN
  Administered 2013-10-06: 0.4 mg via SUBLINGUAL

## 2013-10-06 NOTE — ED Provider Notes (Signed)
Medical screening examination/treatment/procedure(s) were conducted as a shared visit with non-physician practitioner(s) and myself.  I personally evaluated the patient during the encounter.  EKG Interpretation    Date/Time:  Monday October 06 2013 11:55:20 EST Ventricular Rate:  54 PR Interval:  154 QRS Duration: 108 QT Interval:  452 QTC Calculation: 428 R Axis:   60 Text Interpretation:  Sinus bradycardia Incomplete right bundle branch block Borderline ECG No significant change was found Confirmed by [facility]  MD, TREY ([id number]) on 10/06/2013 4:57:49 PM              Candyce ChurnJohn David Kumar Falwell, MD 10/06/13 2328

## 2013-10-06 NOTE — Telephone Encounter (Signed)
I think we should get her into the office this week if she is willing to come in. Thanks, chris

## 2013-10-06 NOTE — ED Provider Notes (Signed)
CSN: [id number]     Arrival date & time 10/06/13  1143 History   First MD Initiated Contact with Patient 10/06/13 1635     Chief Complaint  Patient presents with  . Hypertension   (Consider location/radiation/quality/duration/timing/severity/associated sxs/prior Treatment) HPI Samantha Short is a 68 y.o. female who presents to emergency department complaining of chest pain and elevated blood pressure. Patient states her pain has been all day starting this morning. States pain feels like pressure in the center of her chest and radiates into the left arm. She denies any associated shortness of breath or dizziness. She states that she does feel some nausea. She states pain comes and goes independent of exertion. She states she has history of coronary artery disease with CABG in 2010. She denies any symptoms at that time. She states she has never had similar pain in the past. She states her blood pressures have been elevated for last several weeks, states she went and saw her primary care doctor on Friday. States saw a PA who added Zetia however the rest of the medications she did not change. They said that they were going to refer her back to her cardiologist. Patient states when her pain started today she called her cardiologist who told her to come here. Patient did not take any aspirin or nitroglycerin for her symptoms at home. Patient admits to being under a lot of stress lately, she states she lost her husband to MI 2 weeks ago. She denies any fever, chills, cough. Pain does not radiate into her abdomen or back.  Past Medical History  Diagnosis Date  . Coronary artery disease     a. s/p CABG x 1 2010:  LIMA->LAD.;  b. amdx for CP => LHC 07/04/12: LAD 70-80%, mid RCA 30%, LIMA-LAD patent with competitive flow limiting distal LAD filling, EF 70% with hyperdynamic LV function. Medical therapy continued.  . Hyperlipidemia   . Hypertension   . Chronic diastolic CHF (congestive heart failure)     a.  02/2011 Echo: EF 55-60%, Gr2 DD, Mild MR  . GERD (gastroesophageal reflux disease)   . Dyslipidemia   . Mitral regurgitation     a. mild by echo 02/2011.  . Asthma    Past Surgical History  Procedure Laterality Date  . Cardiac catheterization  07/27/09 & 07/28/09  . Cesarean section    . Cesarean section    . Vesicovaginal fistula closure w/ tah  1998  . Gallbladder surgery  2001  . Shoulder surgery  1998 / 2001    from accident  . Coronary artery bypass graft  08/03/2009    x1 using left internal mammary artery to distal left anterior  descending coronary artery.    Family History  Problem Relation Age of Onset  . Cancer Father   . Heart attack Father   . Heart attack Mother   . Heart disease Mother     had pacemaker  . Cancer Brother   . Heart disease Brother   . Heart disease Sister   . Cancer Sister   . Angina Sister   . Coronary artery disease Son   . Hypertension Sister   . Hypertension Brother    History  Substance Use Topics  . Smoking status: Former Smoker -- 7 years    Types: Cigarettes    Quit date: 07/12/2010  . Smokeless tobacco: Never Used  . Alcohol Use: No   OB History   Grav Para Term Preterm Abortions TAB SAB Ect Mult  Living                 Review of Systems  Constitutional: Negative for fever and chills.  Respiratory: Positive for chest tightness. Negative for cough and shortness of breath.   Cardiovascular: Positive for chest pain. Negative for palpitations and leg swelling.  Gastrointestinal: Positive for nausea. Negative for vomiting, abdominal pain and diarrhea.  Genitourinary: Negative for dysuria, flank pain, vaginal bleeding, vaginal discharge, vaginal pain and pelvic pain.  Musculoskeletal: Negative for arthralgias, myalgias, neck pain and neck stiffness.  Skin: Negative for rash.  Neurological: Negative for dizziness, weakness and headaches.  Psychiatric/Behavioral: The patient is nervous/anxious.   All other systems reviewed and are  negative.    Allergies  Ceclor; Crestor; Erythromycin; Lipitor; Norvasc; Penicillins; Sulfur; Tetracyclines & related; and Latex  Home Medications   Current Outpatient Rx  Name  Route  Sig  Dispense  Refill  . aspirin 81 MG tablet   Oral   Take 81 mg by mouth daily.         . cycloSPORINE (RESTASIS) 0.05 % ophthalmic emulsion   Both Eyes   Place 1 drop into both eyes 2 (two) times daily as needed (for dry eyes).         Marland Kitchen esomeprazole (NEXIUM) 40 MG capsule   Oral   Take 40 mg by mouth daily at 12 noon.         . ezetimibe (ZETIA) 10 MG tablet   Oral   Take 10 mg by mouth daily.         Marland Kitchen lisinopril (PRINIVIL,ZESTRIL) 20 MG tablet   Oral   Take 1 tablet (20 mg total) by mouth daily.   90 tablet   3   . metoprolol (LOPRESSOR) 50 MG tablet   Oral   Take 1.5 tablets (75 mg total) by mouth 2 (two) times daily.   270 tablet   3   . Multiple Vitamins-Minerals (PRESERVISION AREDS 2 PO)   Oral   Take 1 capsule by mouth 2 (two) times daily.         . nitroGLYCERIN (NITROSTAT) 0.4 MG SL tablet   Sublingual   Place 1 tablet (0.4 mg total) under the tongue every 5 (five) minutes as needed for chest pain.   90 tablet   3   . Omega-3 Fatty Acids (FISH OIL PO)   Oral   Take 1,200 mg by mouth 2 (two) times daily.         . pravastatin (PRAVACHOL) 10 MG tablet   Oral   Take 1 tablet (10 mg total) by mouth every other day.   90 tablet   3    BP 194/83  Pulse 60  Temp(Src) 97.4 F (36.3 C) (Oral)  Resp 16  Ht 5\' 2"  (1.575 m)  Wt 200 lb 8 oz (90.946 kg)  BMI 36.66 kg/m2  SpO2 97% Physical Exam  Nursing note and vitals reviewed. Constitutional: She is oriented to person, place, and time. She appears well-developed and well-nourished.  Tearful.  HENT:  Head: Normocephalic.  Eyes: Conjunctivae are normal.  Neck: Neck supple.  Cardiovascular: Normal rate, regular rhythm and normal heart sounds.   Pulmonary/Chest: Effort normal and breath sounds  normal. No respiratory distress. She has no wheezes. She has no rales.  Abdominal: Soft. Bowel sounds are normal. She exhibits no distension. There is no tenderness. There is no rebound.  Musculoskeletal: She exhibits no edema.  Neurological: She is alert and oriented to person, place,  and time.  Skin: Skin is warm and dry.  Psychiatric: She has a normal mood and affect. Her behavior is normal.    ED Course  Procedures (including critical care time) Labs Review Labs Reviewed  COMPREHENSIVE METABOLIC PANEL - Abnormal; Notable for the following:    Glucose, Bld 102 (*)    GFR calc non Af Amer 68 (*)    GFR calc Af Amer 79 (*)    All other components within normal limits  CBC WITH DIFFERENTIAL  POCT I-STAT TROPONIN I   Imaging Review Dg Chest 2 View  10/06/2013   CLINICAL DATA:  Hypertension, coronary artery disease  EXAM: CHEST  2 VIEW  COMPARISON:  07/04/2012  FINDINGS: Normal heart size post median sternotomy.  Mediastinal contours and pulmonary vascularity normal.  Calcified AP window and hilar adenopathy.  Calcified pulmonary granulomata bilaterally.  Lungs otherwise clear.  No pleural effusion or pneumothorax.  Bones unremarkable.  IMPRESSION: Old granulomatous disease.  No acute abnormalities.   Electronically Signed   By: Ulyses SouthwardMark  Boles M.D.   On: 10/06/2013 18:52    EKG Interpretation    Date/Time:  Monday October 06 2013 11:55:20 EST Ventricular Rate:  54 PR Interval:  154 QRS Duration: 108 QT Interval:  452 QTC Calculation: 428 R Axis:   60 Text Interpretation:  Sinus bradycardia Incomplete right bundle branch block Borderline ECG No significant change was found Confirmed by [facility]  MD, TREY ([id number]) on 10/06/2013 4:57:49 PM            MDM   1. Chest pain   2. Hypertension     Pt with chest pressure, HTN. Pt waited in waiting room for 5.5 hrs, no troponin obtained. Will get troponin, CXR. Pt currently having pain, will try nitroglycerine. I reviewed pt's recent  records and notes in the computer from recent visits and notes.    pts labs unremarkable. CXR negative. Will speak with cardiology.   7:31 PM Discussed with Dr. Charlestine MassedBlackbill, advised if ECG and troponin unchanged/negative, pt is to call the office tomorrow morning and Follow up with Dr. Clifton JamesMcalhany this week.   Will check delta trop  Lottie Musselatyana A Fidencio Duddy, PA-C 10/06/13 2015  8:18 PM Pt asked me to check her TB skin test. It is 0MM on right forearm.   Lottie Musselatyana A Arnette Driggs, PA-C 10/06/13 2019

## 2013-10-06 NOTE — ED Notes (Signed)
Pt given update on wait.  

## 2013-10-06 NOTE — Telephone Encounter (Signed)
Spoke with pt who reports blood pressure was elevated at primary care office on Friday. She also reports they were unable to get blood pressure in left arm and primary MD mentioned possibility of ordering doppler studies to check on this.  Reports blood pressure was elevated on Saturday (see phone note dated 10/04/13 from [place], [place]).  Pt did not go to ED.  She took extra lisinopril on Saturday. Yesterday blood pressure was 120/70 and heart rate 53.  She usually takes Lisinopril in the evening but took extra dose Sunday morning. Earlier today blood pressure was 120/70 and she took dose of lisinopril.  She states she continues to have off and on chest tightness. She states she has been lifting weights recently. Also reports she has been very nervous lately and primary MD wants to put her on nerve pill.  I told pt I would discuss with Tereso NewcomerScott Weaver, PA and call her back.  I discussed with Lorin PicketScott who can see pt in office today at 12:10.  I called pt back and offered her appt today but she is not able to come in today.  I asked her if she would like to schedule an appointment later this week. She states she would but she has to call me back.  Pt then hung up.

## 2013-10-06 NOTE — ED Notes (Addendum)
Lost her husband to MI 2 weeks ago Saw dr on Friday and had  Hi bp then she increased her bp meds and now it is back up her head hurts and she is very emotional

## 2013-10-06 NOTE — ED Notes (Signed)
Pt sitting in chair , does not want to put monitor back on. Is ready to go home. Made aware another troponin has been ordered.

## 2013-10-06 NOTE — Discharge Instructions (Signed)
Continue regular medications. Call Dr. Gibson Ramp office tomorrow for follow up this week.     Chest Pain (Nonspecific) Chest pain has many causes. Your pain could be caused by something serious, such as a heart attack or a blood clot in the lungs. It could also be caused by something less serious, such as a chest bruise or a virus. Follow up with your doctor. More lab tests or other studies may be needed to find the cause of your pain. Most of the time, nonspecific chest pain will improve within 2 to 3 days of rest and mild pain medicine. HOME CARE  For chest bruises, you may put ice on the sore area for 15-20 minutes, 03-04 times a day. Do this only if it makes you feel better.  Put ice in a plastic bag.  Place a towel between the skin and the bag.  Rest for the next 2 to 3 days.  Go back to work if the pain improves.  See your doctor if the pain lasts longer than 1 to 2 weeks.  Only take medicine as told by your doctor.  Quit smoking if you smoke. GET HELP RIGHT AWAY IF:   There is more pain or pain that spreads to the arm, neck, jaw, back, or belly (abdomen).  You have shortness of breath.  You cough more than usual or cough up blood.  You have very bad back or belly pain, feel sick to your stomach (nauseous), or throw up (vomit).  You have very bad weakness.  You pass out (faint).  You have a fever. Any of these problems may be serious and may be an emergency. Do not wait to see if the problems will go away. Get medical help right away. Call your local emergency services 911 in [place] Do not drive yourself to the hospital. MAKE SURE YOU:   Understand these instructions.  Will watch this condition.  Will get help right away if you or your child is not doing well or gets worse. Document Released: 02/14/2008 Document Revised: 11/20/2011 Document Reviewed: 02/14/2008 [facility] Patient Information 2014 [place], [place].  Arterial Hypertension Arterial hypertension (high  blood pressure) is a condition of elevated pressure in your blood vessels. Hypertension over a long period of time is a risk factor for strokes, heart attacks, and heart failure. It is also the leading cause of kidney (renal) failure.  CAUSES   In Adults -- Over 90% of all hypertension has no known cause. This is called essential or primary hypertension. In the other 10% of people with hypertension, the increase in blood pressure is caused by another disorder. This is called secondary hypertension. Important causes of secondary hypertension are:  Heavy alcohol use.  Obstructive sleep apnea.  Hyperaldosterosim (Conn's syndrome).  Steroid use.  Chronic kidney failure.  Hyperparathyroidism.  Medications.  Renal artery stenosis.  Pheochromocytoma.  Cushing's disease.  Coarctation of the aorta.  Scleroderma renal crisis.  Licorice (in excessive amounts).  Drugs (cocaine, methamphetamine). Your caregiver can explain any items above that apply to you.  In Children -- Secondary hypertension is more common and should always be considered.  Pregnancy -- Few women of childbearing age have high blood pressure. However, up to 10% of them develop hypertension of pregnancy. Generally, this will not harm the woman. It may be a sign of 3 complications of pregnancy: preeclampsia, HELLP syndrome, and eclampsia. Follow up and control with medication is necessary. SYMPTOMS   This condition normally does not produce any noticeable symptoms. It  is usually found during a routine exam.  Malignant hypertension is a late problem of high blood pressure. It may have the following symptoms:  Headaches.  Blurred vision.  End-organ damage (this means your kidneys, heart, lungs, and other organs are being damaged).  Stressful situations can increase the blood pressure. If a person with normal blood pressure has their blood pressure go up while being seen by their caregiver, this is often termed  "white coat hypertension." Its importance is not known. It may be related with eventually developing hypertension or complications of hypertension.  Hypertension is often confused with mental tension, stress, and anxiety. DIAGNOSIS  The diagnosis is made by 3 separate blood pressure measurements. They are taken at least 1 week apart from each other. If there is organ damage from hypertension, the diagnosis may be made without repeat measurements. Hypertension is usually identified by having blood pressure readings:  Above 140/90 mmHg measured in both arms, at 3 separate times, over a couple weeks.  Over 130/80 mmHg should be considered a risk factor and may require treatment in patients with diabetes. Blood pressure readings over 120/80 mmHg are called "pre-hypertension" even in non-diabetic patients. To get a true blood pressure measurement, use the following guidelines. Be aware of the factors that can alter blood pressure readings.  Take measurements at least 1 hour after caffeine.  Take measurements 30 minutes after smoking and without any stress. This is another reason to quit smoking  it raises your blood pressure.  Use a proper cuff size. Ask your caregiver if you are not sure about your cuff size.  Most home blood pressure cuffs are automatic. They will measure systolic and diastolic pressures. The systolic pressure is the pressure reading at the start of sounds. Diastolic pressure is the pressure at which the sounds disappear. If you are elderly, measure pressures in multiple postures. Try sitting, lying or standing.  Sit at rest for a minimum of 5 minutes before taking measurements.  You should not be on any medications like decongestants. These are found in many cold medications.  Record your blood pressure readings and review them with your caregiver. If you have hypertension:  Your caregiver may do tests to be sure you do not have secondary hypertension (see "causes"  above).  Your caregiver may also look for signs of metabolic syndrome. This is also called Syndrome X or Insulin Resistance Syndrome. You may have this syndrome if you have type 2 diabetes, abdominal obesity, and abnormal blood lipids in addition to hypertension.  Your caregiver will take your medical and family history and perform a physical exam.  Diagnostic tests may include blood tests (for glucose, cholesterol, potassium, and kidney function), a urinalysis, or an EKG. Other tests may also be necessary depending on your condition. PREVENTION  There are important lifestyle issues that you can adopt to reduce your chance of developing hypertension:  Maintain a normal weight.  Limit the amount of salt (sodium) in your diet.  Exercise often.  Limit alcohol intake.  Get enough potassium in your diet. Discuss specific advice with your caregiver.  Follow a DASH diet (dietary approaches to stop hypertension). This diet is rich in fruits, vegetables, and low-fat dairy products, and avoids certain fats. PROGNOSIS  Essential hypertension cannot be cured. Lifestyle changes and medical treatment can lower blood pressure and reduce complications. The prognosis of secondary hypertension depends on the underlying cause. Many people whose hypertension is controlled with medicine or lifestyle changes can live a normal, healthy life.  RISKS AND COMPLICATIONS  While high blood pressure alone is not an illness, it often requires treatment due to its short- and long-term effects on many organs. Hypertension increases your risk for:  CVAs or strokes (cerebrovascular accident).  Heart failure due to chronically high blood pressure (hypertensive cardiomyopathy).  Heart attack (myocardial infarction).  Damage to the retina (hypertensive retinopathy).  Kidney failure (hypertensive nephropathy). Your caregiver can explain list items above that apply to you. Treatment of hypertension can significantly  reduce the risk of complications. TREATMENT   For overweight patients, weight loss and regular exercise are recommended. Physical fitness lowers blood pressure.  Mild hypertension is usually treated with diet and exercise. A diet rich in fruits and vegetables, fat-free dairy products, and foods low in fat and salt (sodium) can help lower blood pressure. Decreasing salt intake decreases blood pressure in a 1/3 of people.  Stop smoking if you are a smoker. The steps above are highly effective in reducing blood pressure. While these actions are easy to suggest, they are difficult to achieve. Most patients with moderate or severe hypertension end up requiring medications to bring their blood pressure down to a normal level. There are several classes of medications for treatment. Blood pressure pills (antihypertensives) will lower blood pressure by their different actions. Lowering the blood pressure by 10 mmHg may decrease the risk of complications by as much as 25%. The goal of treatment is effective blood pressure control. This will reduce your risk for complications. Your caregiver will help you determine the best treatment for you according to your lifestyle. What is excellent treatment for one person, may not be for you. HOME CARE INSTRUCTIONS   Do not smoke.  Follow the lifestyle changes outlined in the "Prevention" section.  If you are on medications, follow the directions carefully. Blood pressure medications must be taken as prescribed. Skipping doses reduces their benefit. It also puts you at risk for problems.  Follow up with your caregiver, as directed.  If you are asked to monitor your blood pressure at home, follow the guidelines in the "Diagnosis" section above. SEEK MEDICAL CARE IF:   You think you are having medication side effects.  You have recurrent headaches or lightheadedness.  You have swelling in your ankles.  You have trouble with your vision. SEEK IMMEDIATE MEDICAL  CARE IF:   You have sudden onset of chest pain or pressure, difficulty breathing, or other symptoms of a heart attack.  You have a severe headache.  You have symptoms of a stroke (such as sudden weakness, difficulty speaking, difficulty walking). MAKE SURE YOU:   Understand these instructions.  Will watch your condition.  Will get help right away if you are not doing well or get worse. Document Released: 08/28/2005 Document Revised: 11/20/2011 Document Reviewed: 03/28/2007 [facility] Patient Information 2014 [place].

## 2013-10-06 NOTE — ED Provider Notes (Signed)
Medical screening examination/treatment/procedure(s) were conducted as a shared visit with non-physician practitioner(s) and myself.  I personally evaluated the patient during the encounter.  EKG Interpretation    Date/Time:  Monday October 06 2013 11:55:20 EST Ventricular Rate:  54 PR Interval:  154 QRS Duration: 108 QT Interval:  452 QTC Calculation: 428 R Axis:   60 Text Interpretation:  Sinus bradycardia Incomplete right bundle branch block Borderline ECG No significant change was found Confirmed by [name]  MD, TREY ([id number]) on 10/06/2013 4:57:49 PM            68 yo female with hx of CAD s/p CABG presenting with left sided chest pain with associated SOB and mild nausea.  Also hypertensive, although improved somewhat with after nitro.  On exam, no distress, heart sounds normal, regular rate and rhythm, lungs CTAB, normal and equal pulses in BUE.  Initial workup reassuring.  Will discuss with cardiologist given history, but anticipate that she will be appropriate for outpatient management.   Clinical Impression: 1. Chest pain   2. Hypertension       [name], MD 10/06/13 [id number]

## 2013-10-06 NOTE — Telephone Encounter (Signed)
New message   C/O blood pressure day before yesterday 190/110 . Patient  Stated blood pressure was not taken this am.  Discuss medication.

## 2013-10-06 NOTE — Telephone Encounter (Signed)
Pt in ED.  

## 2013-10-07 NOTE — Telephone Encounter (Signed)
Spoke with pt and offered her appt this Friday with Dr.McAlhany but she does not want to come in for office visit. She reports she is still having chest tightness at times but testing was done in ED and she was told everything was OK except for her hypertension. She is asking if medications can be adjusted over phone and I told her she should have office visit due to recent problems in order to make changes.  She will call us back if she wants to schedule appt.

## 2013-10-16 ENCOUNTER — Ambulatory Visit (INDEPENDENT_AMBULATORY_CARE_PROVIDER_SITE_OTHER): Payer: Medicare Other | Admitting: Ophthalmology

## 2013-10-23 ENCOUNTER — Encounter: Payer: Self-pay | Admitting: Cardiovascular Disease

## 2013-10-23 ENCOUNTER — Ambulatory Visit (INDEPENDENT_AMBULATORY_CARE_PROVIDER_SITE_OTHER): Payer: Medicare Other | Admitting: Cardiovascular Disease

## 2013-10-23 VITALS — BP 150/69 | HR 64 | Ht 62.0 in | Wt 204.0 lb

## 2013-10-23 DIAGNOSIS — I10 Essential (primary) hypertension: Secondary | ICD-10-CM

## 2013-10-23 DIAGNOSIS — I2510 Atherosclerotic heart disease of native coronary artery without angina pectoris: Secondary | ICD-10-CM

## 2013-10-23 DIAGNOSIS — E785 Hyperlipidemia, unspecified: Secondary | ICD-10-CM

## 2013-10-23 MED ORDER — EZETIMIBE 10 MG PO TABS: 10.0000 mg | ORAL_TABLET | Freq: Every day | ORAL | Status: DC

## 2013-10-23 MED ORDER — TRIAMTERENE-HCTZ 37.5-25 MG PO TABS: 1.0000 | ORAL_TABLET | Freq: Every day | ORAL | Status: DC

## 2013-10-23 MED ORDER — LISINOPRIL 20 MG PO TABS: 20.0000 mg | ORAL_TABLET | Freq: Every day | ORAL | Status: DC

## 2013-10-23 NOTE — Patient Instructions (Addendum)
Your physician recommends that you schedule a follow-up appointment in: 3 -4 weeks.   Your physician has requested that you have an echocardiogram. Echocardiography is a painless test that uses sound waves to create images of your heart. It provides your doctor with information about the size and shape of your heart and how well your heart's chambers and valves are working. This procedure takes approximately one hour. There are no restrictions for this procedure.   Your physician has recommended you make the following change in your medication --Decrease Lisinopril to 20 mg by mouth daily.  Start Maxzide-37.5/25 mg by mouth daily

## 2013-10-23 NOTE — Progress Notes (Signed)
History of Present Illness: 68 y.o. female a hx of CAD, s/p 1V CABG in 2010, HTN, HL, GERD, mild MR here today for cardiac follow up. She was admitted 06/2012 with chest pain and ruled out for MI. LHC 07/04/12: LAD 70-80%, mid RCA 30%, LIMA-LAD patent with competitive flow limiting distal LAD filling, EF 70% with hyperdynamic LV function. Medical therapy was continued.  She is here today for follow up. She was seen in the ED on 10/06/13 with chest pain. Her BP was 190/120 in the ED. Troponin negative. EKG without ischemic changes. She has had continued mild chest pain at times. No energy when on the treadmill and swimming. She recently had her Lisinopril increased to 40 mg daily in primary care and this has seemed to upset her stomach. Upset about recent experience in [facility] ED.   Primary Care Physician: Mila Palmer  Last Lipid Profile:Lipid Panel     Component Value Date/Time   CHOL 152 07/05/2012 0332   TRIG 52 07/05/2012 0332   HDL 54 07/05/2012 0332   CHOLHDL 2.8 07/05/2012 0332   VLDL 10 07/05/2012 0332   LDLCALC 88 07/05/2012 0332     Past Medical History  Diagnosis Date  . Coronary artery disease     a. s/p CABG x 1 2010:  LIMA->LAD.;  b. amdx for CP => LHC 07/04/12: LAD 70-80%, mid RCA 30%, LIMA-LAD patent with competitive flow limiting distal LAD filling, EF 70% with hyperdynamic LV function. Medical therapy continued.  . Hyperlipidemia   . Hypertension   . Chronic diastolic CHF (congestive heart failure)     a. 02/2011 Echo: EF 55-60%, Gr2 DD, Mild MR  . GERD (gastroesophageal reflux disease)   . Dyslipidemia   . Mitral regurgitation     a. mild by echo 02/2011.  . Asthma     Past Surgical History  Procedure Laterality Date  . Cardiac catheterization  07/27/09 & 07/28/09  . Cesarean section    . Cesarean section    . Vesicovaginal fistula closure w/ tah  1998  . Gallbladder surgery  2001  . Shoulder surgery  1998 / 2001    from accident  . Coronary  artery bypass graft  08/03/2009    x1 using left internal mammary artery to distal left anterior  descending coronary artery.     Current Outpatient Prescriptions  Medication Sig Dispense Refill  . aspirin 81 MG tablet Take 81 mg by mouth daily.      . Cholecalciferol (VITAMIN D PO) Take 1,000 mg by mouth daily.      . cycloSPORINE (RESTASIS) 0.05 % ophthalmic emulsion Place 1 drop into both eyes 2 (two) times daily as needed (for dry eyes).      Marland Kitchen esomeprazole (NEXIUM) 40 MG capsule Take 40 mg by mouth as needed.       . ezetimibe (ZETIA) 10 MG tablet Take 10 mg by mouth daily.      Marland Kitchen lisinopril (PRINIVIL,ZESTRIL) 40 MG tablet Take 40 mg by mouth daily.      . metoprolol (LOPRESSOR) 50 MG tablet Take 1.5 tablets (75 mg total) by mouth 2 (two) times daily.  270 tablet  3  . Multiple Vitamins-Minerals (PRESERVISION AREDS 2 PO) Take 1 capsule by mouth 2 (two) times daily.      . nitroGLYCERIN (NITROSTAT) 0.4 MG SL tablet Place 1 tablet (0.4 mg total) under the tongue every 5 (five) minutes as needed for chest pain.  90 tablet  3  .  Omega-3 Fatty Acids (FISH OIL PO) Take 1,200 mg by mouth 2 (two) times daily.      . pravastatin (PRAVACHOL) 10 MG tablet Take 1 tablet (10 mg total) by mouth every other day.  90 tablet  3   No current facility-administered medications for this visit.    Allergies  Allergen Reactions  . Ceclor [Cefaclor] Other (See Comments)    Reaction unknown  . Crestor [Rosuvastatin Calcium] Other (See Comments)    Reaction unknown  . Erythromycin Other (See Comments)    Reaction unknown  . Lipitor [Atorvastatin Calcium] Other (See Comments)    Reaction unknown  . Norvasc [Amlodipine] Other (See Comments)    Makes patient stiff  . Penicillins Other (See Comments)    Reaction unknown  . Sulfur Other (See Comments)    Reaction unknown  . Tetracyclines & Related Other (See Comments)    Reaction unknown  . Latex Rash    History   Social History  . Marital Status:  Married    Spouse Name: N/A    Number of Children: N/A  . Years of Education: N/A   Occupational History  . Not on file.   Social History Main Topics  . Smoking status: Former Smoker -- 7 years    Types: Cigarettes    Quit date: 07/12/2010  . Smokeless tobacco: Never Used  . Alcohol Use: No  . Drug Use: No  . Sexual Activity: Not on file   Other Topics Concern  . Not on file   Social History Narrative  . No narrative on file    Family History  Problem Relation Age of Onset  . Cancer Father   . Heart attack Father   . Heart attack Mother   . Heart disease Mother     had pacemaker  . Cancer Brother   . Heart disease Brother   . Heart disease Sister   . Cancer Sister   . Angina Sister   . Coronary artery disease Son   . Hypertension Sister   . Hypertension Brother     Review of Systems:  As stated in the HPI and otherwise negative.   BP 150/69  Pulse 64  Ht 5\' 2"  (1.575 m)  Wt 204 lb (92.534 kg)  BMI 37.30 kg/m2  Physical Examination: General: Well developed, well nourished, NAD HEENT: OP clear, mucus membranes moist SKIN: warm, dry. No rashes. Neuro: No focal deficits Musculoskeletal: Muscle strength 5/5 all ext Psychiatric: Mood and affect normal Neck: No JVD, no carotid bruits, no thyromegaly, no lymphadenopathy. Lungs:Clear bilaterally, no wheezes, rhonci, crackles Cardiovascular: Regular rate and rhythm. Soft systolic murmur. No gallops or rubs. Abdomen:Soft. Bowel sounds present. Non-tender.  Extremities: No lower extremity edema. Pulses are 2 + in the bilateral DP/PT.  Assessment and Plan:   1. CAD: She has had 1V CABG with LIMA to LAD. Last cath 10/13 with patent LIMA to LAD, minimal disease RCA, no disease Circumflex. Recent chest pain likely due to her elevated BP with demand ischemia. If we get her BP down and she is still having CP, will consider ischemic evaluation.   2. HTN: BP is still elevated. She is not tolerating a higher dose of  Lisinopril. Will lower Lisinopril to 20 mg per day and will start Maxzide 37.5/25mg  once daily. She has not tolerated Norvasc in the past due to joint stiffness. Continue beta blocker.   3. Hyperlipidemia: Continue statin and Zetia.   4. Mitral regurgitation: Mild by echo June 2012. Repeat  echo now, especially with recent fatigue.

## 2013-10-24 ENCOUNTER — Ambulatory Visit (HOSPITAL_COMMUNITY): Payer: Medicare Other | Attending: Cardiology | Admitting: Cardiology

## 2013-10-24 ENCOUNTER — Encounter: Payer: Self-pay | Admitting: Cardiology

## 2013-10-24 DIAGNOSIS — Z87891 Personal history of nicotine dependence: Secondary | ICD-10-CM | POA: Insufficient documentation

## 2013-10-24 DIAGNOSIS — I059 Rheumatic mitral valve disease, unspecified: Secondary | ICD-10-CM | POA: Insufficient documentation

## 2013-10-24 DIAGNOSIS — Z951 Presence of aortocoronary bypass graft: Secondary | ICD-10-CM | POA: Insufficient documentation

## 2013-10-24 DIAGNOSIS — I2510 Atherosclerotic heart disease of native coronary artery without angina pectoris: Secondary | ICD-10-CM | POA: Insufficient documentation

## 2013-10-24 DIAGNOSIS — E785 Hyperlipidemia, unspecified: Secondary | ICD-10-CM | POA: Insufficient documentation

## 2013-10-24 DIAGNOSIS — I10 Essential (primary) hypertension: Secondary | ICD-10-CM | POA: Insufficient documentation

## 2013-10-24 DIAGNOSIS — I509 Heart failure, unspecified: Secondary | ICD-10-CM | POA: Insufficient documentation

## 2013-10-24 NOTE — Progress Notes (Signed)
Echo performed. 

## 2013-10-29 ENCOUNTER — Telehealth: Payer: Self-pay | Admitting: *Deleted

## 2013-10-29 ENCOUNTER — Ambulatory Visit (INDEPENDENT_AMBULATORY_CARE_PROVIDER_SITE_OTHER): Payer: Medicare Other | Admitting: Ophthalmology

## 2013-10-29 NOTE — Telephone Encounter (Signed)
Spoke with Samantha Short and reviewed echo results with her.  She reports blood pressure of 130/72 and that she is feeling much better. She reports history of allergy to sulfa 45 years ago when she had a UTI at the time of her daughter's birth.  She read about possibility of allergy to maxzide due to sulfa allergy.  She started maxzide after recent office visit with Dr. Clifton JamesMcAlhany and is not having any problems. I reviewed with pharmacist and as long as she is not having problems OK to continue and chance of reaction is small. She should let us know if she develops shortness of breath or any other problems.  I gave Samantha Short this information.

## 2013-11-03 ENCOUNTER — Ambulatory Visit (INDEPENDENT_AMBULATORY_CARE_PROVIDER_SITE_OTHER): Payer: Medicare Other | Admitting: Ophthalmology

## 2013-11-03 DIAGNOSIS — H35039 Hypertensive retinopathy, unspecified eye: Secondary | ICD-10-CM

## 2013-11-03 DIAGNOSIS — I10 Essential (primary) hypertension: Secondary | ICD-10-CM

## 2013-11-03 DIAGNOSIS — H43819 Vitreous degeneration, unspecified eye: Secondary | ICD-10-CM

## 2013-11-03 DIAGNOSIS — H3530 Unspecified macular degeneration: Secondary | ICD-10-CM

## 2013-11-10 ENCOUNTER — Ambulatory Visit (INDEPENDENT_AMBULATORY_CARE_PROVIDER_SITE_OTHER): Payer: Medicare Other | Admitting: Cardiovascular Disease

## 2013-11-10 ENCOUNTER — Encounter: Payer: Self-pay | Admitting: Cardiovascular Disease

## 2013-11-10 VITALS — BP 126/84 | HR 89 | Ht 62.0 in | Wt 205.8 lb

## 2013-11-10 DIAGNOSIS — E785 Hyperlipidemia, unspecified: Secondary | ICD-10-CM

## 2013-11-10 DIAGNOSIS — I10 Essential (primary) hypertension: Secondary | ICD-10-CM

## 2013-11-10 DIAGNOSIS — I2510 Atherosclerotic heart disease of native coronary artery without angina pectoris: Secondary | ICD-10-CM

## 2013-11-10 MED ORDER — TRIAMTERENE-HCTZ 37.5-25 MG PO TABS: 1.0000 | ORAL_TABLET | Freq: Every day | ORAL | Status: DC

## 2013-11-10 NOTE — Progress Notes (Signed)
History of Present Illness: 68 y.o. female a hx of CAD s/p 1V CABG in 2010, HTN, HL, GERD, mild MR here today for cardiac follow up. She was admitted 06/2012 with chest pain and ruled out for MI. LHC 07/04/12: LAD 70-80%, mid RCA 30%, LIMA-LAD patent with competitive flow limiting distal LAD filling, EF 70% with hyperdynamic LV function. Medical therapy was continued. She was seen in the ED at [facility] 10/06/13 with chest pain. Her BP was 190/120 in the ED. Troponin negative. EKG without ischemic changes. She has had continued mild chest pain at times. No energy when on the treadmill and swimming. She had her Lisinopril increased to 40 mg daily in primary care and this seemed to upset her stomach. I lowered her Lisinopril to 20 mg daily and added Maxzide. She has not tolerated Norvasc in the past. The plan was to attempt better BP control and then re-evaluate her chest pain. Echo 10/28/13 with normal LV function and no significant valve disease.   She is here today for follow up. Her breathing has been much better now that her BP is controlled. No chest pain. She is ready to get back into the pool and begin swimming.   Primary Care Physician: Mila Palmer  Last Lipid Profile:Lipid Panel     Component Value Date/Time   CHOL 152 07/05/2012 0332   TRIG 52 07/05/2012 0332   HDL 54 07/05/2012 0332   CHOLHDL 2.8 07/05/2012 0332   VLDL 10 07/05/2012 0332   LDLCALC 88 07/05/2012 0332    Past Medical History  Diagnosis Date  . Coronary artery disease     a. s/p CABG x 1 2010:  LIMA->LAD.;  b. amdx for CP => LHC 07/04/12: LAD 70-80%, mid RCA 30%, LIMA-LAD patent with competitive flow limiting distal LAD filling, EF 70% with hyperdynamic LV function. Medical therapy continued.  . Hyperlipidemia   . Hypertension   . Chronic diastolic CHF (congestive heart failure)     a. 02/2011 Echo: EF 55-60%, Gr2 DD, Mild MR  . GERD (gastroesophageal reflux disease)   . Dyslipidemia   . Mitral regurgitation    a. mild by echo 02/2011.  . Asthma     Past Surgical History  Procedure Laterality Date  . Cardiac catheterization  07/27/09 & 07/28/09  . Cesarean section    . Cesarean section    . Vesicovaginal fistula closure w/ tah  1998  . Gallbladder surgery  2001  . Shoulder surgery  1998 / 2001    from accident  . Coronary artery bypass graft  08/03/2009    x1 using left internal mammary artery to distal left anterior  descending coronary artery.     Current Outpatient Prescriptions  Medication Sig Dispense Refill  . aspirin 81 MG tablet Take 81 mg by mouth daily.      . Cholecalciferol (VITAMIN D PO) Take 1,000 mg by mouth daily.      . cycloSPORINE (RESTASIS) 0.05 % ophthalmic emulsion Place 1 drop into both eyes 2 (two) times daily as needed (for dry eyes).      Marland Kitchen esomeprazole (NEXIUM) 40 MG capsule Take 40 mg by mouth as needed.       . ezetimibe (ZETIA) 10 MG tablet Take 1 tablet (10 mg total) by mouth daily.  90 tablet  3  . lisinopril (PRINIVIL,ZESTRIL) 20 MG tablet Take 1 tablet (20 mg total) by mouth daily.  30 tablet  0  . metoprolol (LOPRESSOR) 50 MG tablet Take  1.5 tablets (75 mg total) by mouth 2 (two) times daily.  270 tablet  3  . Multiple Vitamins-Minerals (PRESERVISION AREDS 2 PO) Take 1 capsule by mouth 2 (two) times daily.      . nitroGLYCERIN (NITROSTAT) 0.4 MG SL tablet Place 1 tablet (0.4 mg total) under the tongue every 5 (five) minutes as needed for chest pain.  90 tablet  3  . Omega-3 Fatty Acids (FISH OIL PO) Take 1,200 mg by mouth 2 (two) times daily.      . pravastatin (PRAVACHOL) 10 MG tablet Take 1 tablet (10 mg total) by mouth every other day.  90 tablet  3   No current facility-administered medications for this visit.    Allergies  Allergen Reactions  . Ceclor [Cefaclor] Other (See Comments)    Reaction unknown  . Crestor [Rosuvastatin Calcium] Other (See Comments)    Reaction unknown  . Erythromycin Other (See Comments)    Reaction unknown  . Lipitor  [Atorvastatin Calcium] Other (See Comments)    Reaction unknown  . Norvasc [Amlodipine] Other (See Comments)    Makes patient stiff  . Penicillins Other (See Comments)    Reaction unknown  . Sulfur Other (See Comments)    Reaction unknown  . Tetracyclines & Related Other (See Comments)    Reaction unknown  . Latex Rash    History   Social History  . Marital Status: Married    Spouse Name: N/A    Number of Children: N/A  . Years of Education: N/A   Occupational History  . Not on file.   Social History Main Topics  . Smoking status: Former Smoker -- 7 years    Types: Cigarettes    Quit date: 07/12/2010  . Smokeless tobacco: Never Used  . Alcohol Use: No  . Drug Use: No  . Sexual Activity: Not on file   Other Topics Concern  . Not on file   Social History Narrative  . No narrative on file    Family History  Problem Relation Age of Onset  . Cancer Father   . Heart attack Father   . Heart attack Mother   . Heart disease Mother     had pacemaker  . Cancer Brother   . Heart disease Brother   . Heart disease Sister   . Cancer Sister   . Angina Sister   . Coronary artery disease Son   . Hypertension Sister   . Hypertension Brother     Review of Systems:  As stated in the HPI and otherwise negative.   BP 126/84  Pulse 89  Ht 5\' 2"  (1.575 m)  Wt 205 lb 12.8 oz (93.35 kg)  BMI 37.63 kg/m2  Physical Examination: General: Well developed, well nourished, NAD HEENT: OP clear, mucus membranes moist SKIN: warm, dry. No rashes. Neuro: No focal deficits Musculoskeletal: Muscle strength 5/5 all ext Psychiatric: Mood and affect normal Neck: No JVD, no carotid bruits, no thyromegaly, no lymphadenopathy. Lungs:Clear bilaterally, no wheezes, rhonci, crackles Cardiovascular: Regular rate and rhythm. Soft systolic murmur. No gallops or rubs. Abdomen:Soft. Bowel sounds present. Non-tender.  Extremities: No lower extremity edema. Pulses are 2 + in the bilateral  DP/PT.  Echo 10/28/13: Left ventricle: The cavity size was normal. Wall thickness was normal. Systolic function was normal. The estimated ejection fraction was in the range of 55% to 60%. Wall motion was normal; there were no regional wall motion abnormalities. Doppler parameters are consistent with abnormal left ventricular relaxation (grade 1 diastolic  dysfunction). - Aortic valve: There was no stenosis. - Mitral valve: Moderately calcified annulus. Mildly calcified leaflets . Trivial regurgitation. - Left atrium: The atrium was mildly dilated. - Tricuspid valve: Peak RV-RA gradient: 27mm Hg (S). - Pulmonary arteries: PA peak pressure: 30mm Hg (S). - Inferior vena cava: The vessel was normal in size; the respirophasic diameter changes were in the normal range (= 50%); findings are consistent with normal central venous pressure. Impressions:  - Normal LV size and systolic function, EF 55-60%. Normal RV size and systolic function. No significant valvular abnormalities.  Assessment and Plan:   1. CAD: She has had 1V CABG with LIMA to LAD. Last cath 10/13 with patent LIMA to LAD, minimal disease RCA, no disease Circumflex. Continue medical management.   2. HTN: BP is better controlled on current regimen. She has not tolerated Norvasc in the past due to joint stiffness. (She has a sulfa allergy but has tolerated Maxzide without issues).   3. Hyperlipidemia: Continue statin. She wants to stop Zetia due to cost.   4. Mitral regurgitation: No significant regurgitation by echo February 2015.

## 2013-11-10 NOTE — Patient Instructions (Signed)
Your physician wants you to follow-up in:  6 months. You will receive a reminder letter in the mail two months in advance. If you don't receive a letter, please call our office to schedule the follow-up appointment.   

## 2014-04-14 ENCOUNTER — Encounter: Payer: Self-pay | Admitting: Cardiovascular Disease

## 2014-04-23 ENCOUNTER — Telehealth: Payer: Self-pay | Admitting: Cardiovascular Disease

## 2014-04-23 NOTE — Telephone Encounter (Signed)
Pt was seen by her PCP due to Hands and feel were always freezing, SOB the PCP checked her blood and the Kidney function was high. Pt states  her PCP stop her HCTZ 25 mg medication on July 28 th, due to elevated kidney levels. Pt's BP have steadily going up. Pt states her BP today is 137/71 Pt's BP rungs 115 systolic when she takes her HCTZ medication. Pt states she is getting headaches now also. Pt is aware that this message will be send to Dr. Clifton JamesMcalhany for recommendations.

## 2014-04-23 NOTE — Telephone Encounter (Signed)
Pt is aware of MD's recommendations. Pt verbalized understanding. 

## 2014-04-23 NOTE — Telephone Encounter (Signed)
She should call her primary care doctor to adjust BP meds since her BP meds were stopped in primary care. Thayer Ohmhris

## 2014-04-23 NOTE — Telephone Encounter (Signed)
New message    Patient calling C/O reaction to medication  Stop 2 weeks ago . Stating her blood pressure steady going up.  137/71 today  .    went in to PCP for routine check. Feet / hand cold, numb sob,.just did not feel good. Blood work came back kidney function is messed up.

## 2014-05-25 ENCOUNTER — Encounter: Payer: Self-pay | Admitting: Cardiovascular Disease

## 2014-05-25 ENCOUNTER — Ambulatory Visit (INDEPENDENT_AMBULATORY_CARE_PROVIDER_SITE_OTHER): Payer: Medicare Other | Admitting: Cardiovascular Disease

## 2014-05-25 VITALS — BP 140/86 | HR 52 | Ht 62.0 in | Wt 210.4 lb

## 2014-05-25 DIAGNOSIS — I5032 Chronic diastolic (congestive) heart failure: Secondary | ICD-10-CM

## 2014-05-25 DIAGNOSIS — I509 Heart failure, unspecified: Secondary | ICD-10-CM

## 2014-05-25 DIAGNOSIS — I10 Essential (primary) hypertension: Secondary | ICD-10-CM

## 2014-05-25 DIAGNOSIS — I340 Nonrheumatic mitral (valve) insufficiency: Secondary | ICD-10-CM

## 2014-05-25 DIAGNOSIS — E785 Hyperlipidemia, unspecified: Secondary | ICD-10-CM

## 2014-05-25 DIAGNOSIS — I2510 Atherosclerotic heart disease of native coronary artery without angina pectoris: Secondary | ICD-10-CM

## 2014-05-25 DIAGNOSIS — I059 Rheumatic mitral valve disease, unspecified: Secondary | ICD-10-CM

## 2014-05-25 LAB — LIPID PANEL
CHOL/HDL RATIO: 3
CHOLESTEROL: 171 mg/dL (ref 0–200)
HDL: 52.8 mg/dL (ref >39.00)
LDL Cholesterol: 107 mg/dL — ABNORMAL HIGH (ref 0–99)
NonHDL: 118.2
TRIGLYCERIDES: 54 mg/dL (ref 0.0–149.0)
VLDL: 10.8 mg/dL (ref 0.0–40.0)

## 2014-05-25 LAB — HEPATIC FUNCTION PANEL
ALBUMIN: 3.8 g/dL (ref 3.5–5.2)
ALK PHOS: 58 U/L (ref 39–117)
ALT: 32 U/L (ref 0–35)
AST: 39 U/L — ABNORMAL HIGH (ref 0–37)
BILIRUBIN DIRECT: 0.1 mg/dL (ref 0.0–0.3)
TOTAL PROTEIN: 7.3 g/dL (ref 6.0–8.3)
Total Bilirubin: 0.6 mg/dL (ref 0.2–1.2)

## 2014-05-25 LAB — BASIC METABOLIC PANEL
BUN: 21 mg/dL (ref 6–23)
CHLORIDE: 106 meq/L (ref 96–112)
CO2: 25 mEq/L (ref 19–32)
CREATININE: 1.3 mg/dL — AB (ref 0.4–1.2)
Calcium: 9.5 mg/dL (ref 8.4–10.5)
GFR: 45.32 mL/min — AB (ref >60.00)
Glucose, Bld: 86 mg/dL (ref 70–99)
Potassium: 4.6 mEq/L (ref 3.5–5.1)
Sodium: 139 mEq/L (ref 135–145)

## 2014-05-25 MED ORDER — FUROSEMIDE 20 MG PO TABS: 20.0000 mg | ORAL_TABLET | Freq: Every day | ORAL | Status: DC

## 2014-05-25 MED ORDER — LISINOPRIL 40 MG PO TABS: 40.0000 mg | ORAL_TABLET | Freq: Every day | ORAL | Status: DC

## 2014-05-25 NOTE — Patient Instructions (Signed)
Your physician recommends that you schedule a follow-up appointment in:  6 weeks with NP or PA.   Your physician recommends that you return for lab work in:  10 days  Your physician has recommended you make the following change in your medication:   Increase Lisinopril to 40 mg by mouth daily. Start furosemide 20 mg by mouth daily.

## 2014-05-25 NOTE — Progress Notes (Signed)
History of Present Illness: 68 y.o. female a hx of CAD s/p 1V CABG in 2010, HTN, HLD, GERD, mild MR here today for cardiac follow up. She was admitted 06/2012 with chest pain and ruled out for MI. LHC 07/04/12: LAD 70-80%, mid RCA 30%, LIMA-LAD patent with competitive flow limiting distal LAD filling, EF 70% with hyperdynamic LV function. Medical therapy was continued. She was seen in the ED at [facility] 10/06/13 with chest pain. Her BP was 190/120 in the ED. Troponin negative. EKG without ischemic changes. She has had continued mild chest pain at times. No energy when on the treadmill and swimming. She had her Lisinopril increased to 40 mg daily in primary care and this seemed to upset her stomach. I lowered her Lisinopril to 20 mg daily and added Maxzide. She has not tolerated Norvasc in the past. The plan was to attempt better BP control and then re-evaluate her chest pain. Echo 10/28/13 with normal LV function and no significant valve disease. HCTZ was stopped in primary care due to renal insufficiency.   She is here today for follow up. She denies chest pain. She notes continued dyspnea with walking but no dyspnea while swimming. She has a cough productive of clear sputum. She has sinus issues and thinks the cough and sinus issues are due to allergies. She is asking about her BP control. As above, HCTZ stopped in primary care.   Primary Care Physician: Mila Palmer  Last Lipid Profile:Lipid Panel     Component Value Date/Time   CHOL 152 07/05/2012 0332   TRIG 52 07/05/2012 0332   HDL 54 07/05/2012 0332   CHOLHDL 2.8 07/05/2012 0332   VLDL 10 07/05/2012 0332   LDLCALC 88 07/05/2012 0332    Past Medical History  Diagnosis Date  . Coronary artery disease     a. s/p CABG x 1 2010:  LIMA->LAD.;  b. amdx for CP => LHC 07/04/12: LAD 70-80%, mid RCA 30%, LIMA-LAD patent with competitive flow limiting distal LAD filling, EF 70% with hyperdynamic LV function. Medical therapy continued.  .  Hyperlipidemia   . Hypertension   . Chronic diastolic CHF (congestive heart failure)     a. 02/2011 Echo: EF 55-60%, Gr2 DD, Mild MR  . GERD (gastroesophageal reflux disease)   . Dyslipidemia   . Mitral regurgitation     a. mild by echo 02/2011.  . Asthma     Past Surgical History  Procedure Laterality Date  . Cardiac catheterization  07/27/09 & 07/28/09  . Cesarean section    . Cesarean section    . Vesicovaginal fistula closure w/ tah  1998  . Gallbladder surgery  2001  . Shoulder surgery  1998 / 2001    from accident  . Coronary artery bypass graft  08/03/2009    x1 using left internal mammary artery to distal left anterior  descending coronary artery.     Current Outpatient Prescriptions  Medication Sig Dispense Refill  . aspirin 81 MG tablet Take 81 mg by mouth daily.      . calcium carbonate (OS-CAL) 600 MG TABS tablet Take 600 mg by mouth 2 (two) times daily with a meal.      . Cholecalciferol (VITAMIN D PO) Take 1,000 mg by mouth daily.      . cycloSPORINE (RESTASIS) 0.05 % ophthalmic emulsion Place 1 drop into both eyes 2 (two) times daily as needed (for dry eyes).      Marland Kitchen lisinopril (PRINIVIL,ZESTRIL) 20 MG tablet  Take 1 tablet (20 mg total) by mouth daily.  30 tablet  0  . metoprolol (LOPRESSOR) 50 MG tablet Take 1.5 tablets (75 mg total) by mouth 2 (two) times daily.  270 tablet  3  . Multiple Vitamins-Minerals (PRESERVISION AREDS 2 PO) Take 1 capsule by mouth 2 (two) times daily.      . nitroGLYCERIN (NITROSTAT) 0.4 MG SL tablet Place 1 tablet (0.4 mg total) under the tongue every 5 (five) minutes as needed for chest pain.  90 tablet  3  . Omega-3 Fatty Acids (FISH OIL PO) Take 1,200 mg by mouth 2 (two) times daily.      . pravastatin (PRAVACHOL) 10 MG tablet Take 1 tablet (10 mg total) by mouth every other day.  90 tablet  3   No current facility-administered medications for this visit.    Allergies  Allergen Reactions  . Ceclor [Cefaclor] Other (See Comments)     Reaction unknown  . Crestor [Rosuvastatin Calcium] Other (See Comments)    Reaction unknown  . Erythromycin Other (See Comments)    Reaction unknown  . Lipitor [Atorvastatin Calcium] Other (See Comments)    Reaction unknown  . Norvasc [Amlodipine] Other (See Comments)    Makes patient stiff  . Penicillins Other (See Comments)    Reaction unknown  . Sulfur Other (See Comments)    Reaction unknown  . Tetracyclines & Related Other (See Comments)    Reaction unknown  . Latex Rash    History   Social History  . Marital Status: Married    Spouse Name: N/A    Number of Children: N/A  . Years of Education: N/A   Occupational History  . Not on file.   Social History Main Topics  . Smoking status: Former Smoker -- 7 years    Types: Cigarettes    Quit date: 07/12/2010  . Smokeless tobacco: Never Used  . Alcohol Use: No  . Drug Use: No  . Sexual Activity: Not on file   Other Topics Concern  . Not on file   Social History Narrative  . No narrative on file    Family History  Problem Relation Age of Onset  . Cancer Father   . Heart attack Father   . Heart attack Mother   . Heart disease Mother     had pacemaker  . Cancer Brother   . Heart disease Brother   . Heart disease Sister   . Cancer Sister   . Angina Sister   . Coronary artery disease Son   . Hypertension Sister   . Hypertension Brother     Review of Systems:  As stated in the HPI and otherwise negative.   BP 140/86  Pulse 52  Ht  (1.575 m)  Wt 210 lb 6.4 oz (95.437 kg)  BMI 38.47 kg/m2  Physical Examination: General: Well developed, well nourished, NAD HEENT: OP clear, mucus membranes moist SKIN: warm, dry. No rashes. Neuro: No focal deficits Musculoskeletal: Muscle strength 5/5 all ext Psychiatric: Mood and affect normal Neck: No JVD, no carotid bruits, no thyromegaly, no lymphadenopathy. Lungs:Clear bilaterally, no wheezes, rhonci, crackles Cardiovascular: Regular rate and rhythm. Soft  systolic murmur. No gallops or rubs. Abdomen:Soft. Bowel sounds present. Non-tender.  Extremities: No lower extremity edema. Pulses are 2 + in the bilateral DP/PT.  Echo 10/28/13: Left ventricle: The cavity size was normal. Wall thickness was normal. Systolic function was normal. The estimated ejection fraction was in the range of 55% to 60%. Wall  motion was normal; there were no regional wall motion abnormalities. Doppler parameters are consistent with abnormal left ventricular relaxation (grade 1 diastolic dysfunction). - Aortic valve: There was no stenosis. - Mitral valve: Moderately calcified annulus. Mildly calcified leaflets . Trivial regurgitation. - Left atrium: The atrium was mildly dilated. - Tricuspid valve: Peak RV-RA gradient: 27mm Hg (S). - Pulmonary arteries: PA peak pressure: 30mm Hg (S). - Inferior vena cava: The vessel was normal in size; the respirophasic diameter changes were in the normal range (= 50%); findings are consistent with normal central venous pressure. Impressions: - Normal LV size and systolic function, EF 55-60%. Normal RV size and systolic function. No significant valvular abnormalities.  Assessment and Plan:   1. CAD: Stable. I do not think her dyspnea is related to ischemia. She has had 1V CABG with LIMA to LAD. Last cath 10/13 with patent LIMA to LAD, minimal disease RCA, no disease Circumflex. Continue medical management.   2. HTN: BP is slightly elevated. Will increase Lisinopril to 40 mg daily. Check BMET today and again in 10 days. She has not tolerated Norvasc in the past due to joint stiffness. She has not tolerated Maxzide due to renal insufficiency.    3. Hyperlipidemia: Continue statin. She stopped Zetia due to cost. Check lipids and LFTs today.   4. Mitral regurgitation: No significant regurgitation by echo February 2015.   5. Chronic diastolic CHF: Pt is up 10 lbs over last 8 months. No significant swelling. She has been trying to  exercise and watch what she eats. She feels that she is retaining fluid. Will try low dose Lasix 20 mg daily. Repeat BMET in 10 days.

## 2014-05-29 ENCOUNTER — Telehealth: Payer: Self-pay | Admitting: Cardiovascular Disease

## 2014-05-29 DIAGNOSIS — E785 Hyperlipidemia, unspecified: Secondary | ICD-10-CM

## 2014-05-29 MED ORDER — PRAVASTATIN SODIUM 20 MG PO TABS: 20.0000 mg | ORAL_TABLET | Freq: Every day | ORAL | Status: DC

## 2014-05-29 NOTE — Telephone Encounter (Signed)
Follow up      Please call with test results

## 2014-05-29 NOTE — Telephone Encounter (Signed)
Spoke with pt and reviewed lab results with her. Pravastatin was increased by primary care to 20 mg every other day prior to visit with Dr. Clifton James. She will start taking this daily and come in for fasting lab work the week of August 17, 2014. She does not need new prescription at this time.

## 2014-05-29 NOTE — Telephone Encounter (Signed)
New message  ° ° °Patient calling for test results.   °

## 2014-06-04 ENCOUNTER — Other Ambulatory Visit (INDEPENDENT_AMBULATORY_CARE_PROVIDER_SITE_OTHER): Payer: Medicare Other

## 2014-06-04 DIAGNOSIS — I10 Essential (primary) hypertension: Secondary | ICD-10-CM

## 2014-06-04 DIAGNOSIS — I509 Heart failure, unspecified: Secondary | ICD-10-CM

## 2014-06-04 DIAGNOSIS — I5032 Chronic diastolic (congestive) heart failure: Secondary | ICD-10-CM

## 2014-06-04 DIAGNOSIS — E785 Hyperlipidemia, unspecified: Secondary | ICD-10-CM

## 2014-06-04 LAB — LIPID PANEL
Cholesterol: 168 mg/dL (ref 0–200)
HDL: 56.7 mg/dL (ref >39.00)
LDL Cholesterol: 97 mg/dL (ref 0–99)
NONHDL: 111.3
Total CHOL/HDL Ratio: 3
Triglycerides: 71 mg/dL (ref 0.0–149.0)
VLDL: 14.2 mg/dL (ref 0.0–40.0)

## 2014-06-04 LAB — BASIC METABOLIC PANEL
BUN: 24 mg/dL — ABNORMAL HIGH (ref 6–23)
CO2: 24 mEq/L (ref 19–32)
Calcium: 9.3 mg/dL (ref 8.4–10.5)
Chloride: 105 mEq/L (ref 96–112)
Creatinine, Ser: 1.4 mg/dL — ABNORMAL HIGH (ref 0.4–1.2)
GFR: 39.11 mL/min — AB (ref >60.00)
GLUCOSE: 97 mg/dL (ref 70–99)
Potassium: 4.6 mEq/L (ref 3.5–5.1)
SODIUM: 137 meq/L (ref 135–145)

## 2014-06-04 LAB — HEPATIC FUNCTION PANEL
ALBUMIN: 4 g/dL (ref 3.5–5.2)
ALK PHOS: 60 U/L (ref 39–117)
ALT: 30 U/L (ref 0–35)
AST: 38 U/L — AB (ref 0–37)
Bilirubin, Direct: 0.1 mg/dL (ref 0.0–0.3)
Total Bilirubin: 0.7 mg/dL (ref 0.2–1.2)
Total Protein: 7.3 g/dL (ref 6.0–8.3)

## 2014-06-10 ENCOUNTER — Telehealth: Payer: Self-pay | Admitting: Cardiovascular Disease

## 2014-06-10 NOTE — Telephone Encounter (Signed)
If she cannot tolerate the statin then she should stop. If she cannot afford Zetia then we will not fill that. If she is having LE edema, she should elevate feet and take extra 20 mg of Lasix for next several days. Thanks, chris

## 2014-06-10 NOTE — Telephone Encounter (Signed)
New problem   Pt want to know results of her labs she had done last week. Please call pt.

## 2014-06-10 NOTE — Telephone Encounter (Signed)
Left pt a message to call back. 

## 2014-06-10 NOTE — Telephone Encounter (Signed)
Pt is aware of lab results and MD's recommendations. Pt states she can't afford Zetia. Pt is taking Pravastatin 20 mg once daily. Pt said that this medication make her be stiff. Pt also C/O of having bilateral ankle edema it started last night . The left ankle is more edematous that the right. Pt takes Lasix 20 mg once daily. Pt is aware that this message will be send to MD for reviewing.

## 2014-06-11 NOTE — Telephone Encounter (Signed)
lmtcb

## 2014-06-15 NOTE — Telephone Encounter (Signed)
Patient called to return phone call. Gave patient Dr. Gibson RampMcAlhany's instructions, that if she cannot tolerate the statin she should stop. Patient st she is going to stop, that it makes her "stiff as a board." Instructed patient to take an extra Lasix for a few days, but patient st she is no longer swollen. Instructed patient that if her ankles swell again, to elevate them. Pt st her BP is high today ("top number is 180"), so instructed patient to take an extra lasix today anyway to try to get pressure down. Instructed patient to call the office if her pressures stay up.  Patient concerned because she wants another cholesterol medication besides pravastatin and Zetia.  Forwarding to Dr. Clifton JamesMcAlhany for review and recommendations.

## 2014-06-16 NOTE — Telephone Encounter (Signed)
Follow Up  Pt states she did speak with linda so she is not sure if a call back is needed.

## 2014-06-16 NOTE — Telephone Encounter (Signed)
Spoke with pt and gave her recommendations from Dr. Clifton JamesMcAlhany. She does not want to start Crestor at this time.  She will discuss at upcoming office visit with Sunday SpillersLori Gerhardt,NP on July 06, 2014

## 2014-06-16 NOTE — Telephone Encounter (Signed)
Left message for pt to call back  °

## 2014-06-16 NOTE — Telephone Encounter (Signed)
We can try a low dose of Crestor 5mg  every other day. She can maybe come by the office and get samples to try for a month before writing a prescription. cdm

## 2014-07-06 ENCOUNTER — Encounter: Payer: Self-pay | Admitting: Nurse Practitioner

## 2014-07-06 ENCOUNTER — Ambulatory Visit (INDEPENDENT_AMBULATORY_CARE_PROVIDER_SITE_OTHER): Payer: Medicare Other | Admitting: Nurse Practitioner

## 2014-07-06 VITALS — BP 160/100 | HR 51 | Ht 62.0 in | Wt 209.0 lb

## 2014-07-06 DIAGNOSIS — R0600 Dyspnea, unspecified: Secondary | ICD-10-CM

## 2014-07-06 DIAGNOSIS — I25810 Atherosclerosis of coronary artery bypass graft(s) without angina pectoris: Secondary | ICD-10-CM

## 2014-07-06 DIAGNOSIS — I5032 Chronic diastolic (congestive) heart failure: Secondary | ICD-10-CM

## 2014-07-06 DIAGNOSIS — I2510 Atherosclerotic heart disease of native coronary artery without angina pectoris: Secondary | ICD-10-CM

## 2014-07-06 DIAGNOSIS — E785 Hyperlipidemia, unspecified: Secondary | ICD-10-CM

## 2014-07-06 DIAGNOSIS — I10 Essential (primary) hypertension: Secondary | ICD-10-CM

## 2014-07-06 LAB — BASIC METABOLIC PANEL
BUN: 19 mg/dL (ref 6–23)
CO2: 29 mEq/L (ref 19–32)
Calcium: 9.3 mg/dL (ref 8.4–10.5)
Chloride: 105 mEq/L (ref 96–112)
Creatinine, Ser: 1.3 mg/dL — ABNORMAL HIGH (ref 0.4–1.2)
GFR: 42.54 mL/min — ABNORMAL LOW (ref >60.00)
Glucose, Bld: 90 mg/dL (ref 70–99)
Potassium: 4.3 mEq/L (ref 3.5–5.1)
Sodium: 136 mEq/L (ref 135–145)

## 2014-07-06 MED ORDER — LISINOPRIL 40 MG PO TABS: 40.0000 mg | ORAL_TABLET | Freq: Every day | ORAL | Status: DC

## 2014-07-06 MED ORDER — NITROGLYCERIN 0.4 MG SL SUBL: 0.4000 mg | SUBLINGUAL_TABLET | SUBLINGUAL | Status: DC | PRN

## 2014-07-06 MED ORDER — FUROSEMIDE 20 MG PO TABS: 20.0000 mg | ORAL_TABLET | Freq: Every day | ORAL | Status: DC

## 2014-07-06 MED ORDER — METOPROLOL TARTRATE 50 MG PO TABS: 75.0000 mg | ORAL_TABLET | Freq: Two times a day (BID) | ORAL | Status: DC

## 2014-07-06 NOTE — Progress Notes (Signed)
Samantha Short Date of Birth: Jul 26, 1946 Medical Record #[id number]  History of Present Illness: Ms. Bolduc is seen back today for a 6 week check. She is a hx of CAD s/p 1V CABG in 2010, HTN, HLD, GERD, mild MR here today for cardiac follow up. She was admitted back in 2013 with chest pain and ruled out for MI. LHC 07/04/12: LAD 70-80%, mid RCA 30%, LIMA-LAD patent with competitive flow limiting distal LAD filling, EF 70% with hyperdynamic LV function. Medical therapy was continued. She was seen in the ED at [facility] 10/06/13 with chest pain. Her BP was 190/120 in the ED. Troponin negative. EKG without ischemic changes.  She had her Lisinopril increased to 40 mg daily in primary care and this seemed to upset her stomach. I lowered her Lisinopril to 20 mg daily and added Maxzide. She has not tolerated Norvasc in the past. The plan was to attempt better BP control and then re-evaluate her chest pain. Echo 10/28/13 with normal LV function and no significant valve disease. HCTZ was stopped in primary care due to renal insufficiency.   Seen back here 6 weeks ago - she continued to have dyspnea with walking but no dyspnea while swimming. She had a cough productive of clear sputum. She was asking about her BP control. As above, HCTZ stopped in primary care for renal insufficiency. Dr. Clifton James felt that her dyspnea was not related to ischemia and advised continued medical management. He did increase her ACE due to elevated BP. Low dose lasix was also added due to weight gain and possible retention of fluid.   Comes back today. Here alone. She says she feels so much better - no diarrhea and says she can "get her leg up to get in her house now" - actually attributes this to stopping the statin - she does not wish to try Crestor. She cannot afford Zetia. BP control she says at home is labile - down in the 120's but as high as 180 - higher with stress. She says an average is probably 130 systolic. Probably gets too  much salt. Continues to exercise regularly. Says she is happy with how she is doing for someone who is 30 (she is 29). Not really interested in taking more medicines. She has put herself on Red Yeast Rice. She notes that she continues to be short of breath but has none with her exercise program.    Current Outpatient Prescriptions  Medication Sig Dispense Refill  . ALBUTEROL SULFATE HFA IN Inhale into the lungs as needed (resuce inhaler).      Marland Kitchen aspirin 81 MG tablet Take 81 mg by mouth daily.      . calcium carbonate (OS-CAL) 600 MG TABS tablet Take 600 mg by mouth 2 (two) times daily with a meal.      . Cholecalciferol (VITAMIN D PO) Take 1,000 mg by mouth daily.      . cycloSPORINE (RESTASIS) 0.05 % ophthalmic emulsion Place 1 drop into both eyes 2 (two) times daily as needed (for dry eyes).      . furosemide (LASIX) 20 MG tablet Take 1 tablet (20 mg total) by mouth daily.  30 tablet  11  . lisinopril (PRINIVIL,ZESTRIL) 40 MG tablet Take 1 tablet (40 mg total) by mouth daily.  90 tablet  3  . metoprolol (LOPRESSOR) 50 MG tablet Take 1.5 tablets (75 mg total) by mouth 2 (two) times daily.  270 tablet  3  . Multiple Vitamins-Minerals (PRESERVISION AREDS 2  PO) Take 1 capsule by mouth 2 (two) times daily.      . nitroGLYCERIN (NITROSTAT) 0.4 MG SL tablet Place 1 tablet (0.4 mg total) under the tongue every 5 (five) minutes as needed for chest pain.  90 tablet  3  . Omega-3 Fatty Acids (FISH OIL PO) Take 1,200 mg by mouth 2 (two) times daily.      . Red Yeast Rice Extract (RED YEAST RICE PO) Take by mouth 2 (two) times daily.       No current facility-administered medications for this visit.    Allergies  Allergen Reactions  . Ceclor [Cefaclor] Other (See Comments)    Lost vision in eye  . Crestor [Rosuvastatin Calcium] Other (See Comments)    Muscle ache  . Erythromycin Other (See Comments)    Rash   . Lipitor [Atorvastatin Calcium] Other (See Comments)    Increased A1C  . Norvasc  [Amlodipine] Other (See Comments)    Makes patient stiff  . Penicillins Other (See Comments)    Hives,rash,shortness of  breath  . Sulfur Other (See Comments)    shock   . Tetracyclines & Related Other (See Comments)    rash  . Latex Rash    Past Medical History  Diagnosis Date  . Coronary artery disease     a. s/p CABG x 1 2010:  LIMA->LAD.;  b. amdx for CP => LHC 07/04/12: LAD 70-80%, mid RCA 30%, LIMA-LAD patent with competitive flow limiting distal LAD filling, EF 70% with hyperdynamic LV function. Medical therapy continued.  . Hyperlipidemia   . Hypertension   . Chronic diastolic CHF (congestive heart failure)     a. 02/2011 Echo: EF 55-60%, Gr2 DD, Mild MR  . GERD (gastroesophageal reflux disease)   . Dyslipidemia   . Mitral regurgitation     a. mild by echo 02/2011.  . Asthma     Past Surgical History  Procedure Laterality Date  . Cardiac catheterization  07/27/09 & 07/28/09  . Cesarean section    . Cesarean section    . Vesicovaginal fistula closure w/ tah  1998  . Gallbladder surgery  2001  . Shoulder surgery  1998 / 2001    from accident  . Coronary artery bypass graft  08/03/2009    x1 using left internal mammary artery to distal left anterior  descending coronary artery.     History  Smoking status  . Former Smoker -- 7 years  . Types: Cigarettes  . Quit date: 07/12/2010  Smokeless tobacco  . Never Used    History  Alcohol Use No    Family History  Problem Relation Age of Onset  . Cancer Father   . Heart attack Father   . Heart attack Mother   . Heart disease Mother     had pacemaker  . Cancer Brother   . Heart disease Brother   . Heart disease Sister   . Cancer Sister   . Angina Sister   . Coronary artery disease Son   . Hypertension Sister   . Hypertension Brother     Review of Systems: The review of systems is per the HPI.  All other systems were reviewed and are negative.  Physical Exam: BP 160/100  Pulse 51  Ht 5\' 2"  (1.575 m)   Wt 209 lb (94.802 kg)  BMI 38.22 kg/m2  SpO2 98% BP 150/90 by me.  Patient is very pleasant and in no acute distress. Skin is warm and dry. Color is normal.  HEENT is unremarkable. Normocephalic/atraumatic. PERRL. Sclera are nonicteric. Neck is supple. No masses. No JVD. Lungs are clear. Cardiac exam shows a regular rate and rhythm. Abdomen is soft. Extremities are without edema. Gait and ROM are intact. No gross neurologic deficits noted.  Wt Readings from Last 3 Encounters:  07/06/14 209 lb (94.802 kg)  05/25/14 210 lb 6.4 oz (95.437 kg)  11/10/13 205 lb 12.8 oz (93.35 kg)    LABORATORY DATA/PROCEDURES: BMET is pending  Lab Results  Component Value Date   WBC 8.9 10/06/2013   HGB 13.1 10/06/2013   HCT 39.5 10/06/2013   PLT 240 10/06/2013   GLUCOSE 97 06/04/2014   CHOL 168 06/04/2014   TRIG 71.0 06/04/2014   HDL 56.70 06/04/2014   LDLCALC 97 06/04/2014   ALT 30 06/04/2014   AST 38* 06/04/2014   NA 137 06/04/2014   K 4.6 06/04/2014   CL 105 06/04/2014   CREATININE 1.4* 06/04/2014   BUN 24* 06/04/2014   CO2 24 06/04/2014   TSH 0.908 07/04/2012   INR 1.11 07/05/2012   HGBA1C  Value: 5.8 (NOTE) The ADA recommends the following therapeutic goal for glycemic control related to Hgb A1c measurement: Goal of therapy: <6.5 Hgb A1c  Reference: American Diabetes Association: Clinical Practice Recommendations 2010, Diabetes Care, 2010, 33: (Suppl  1). 07/29/2009    BNP (last 3 results) No results found for this basename: PROBNP,  in the last 8760 hours  Echo Study Conclusions from 10/2013  - Left ventricle: The cavity size was normal. Wall thickness was normal. Systolic function was normal. The estimated ejection fraction was in the range of 55% to 60%. Wall motion was normal; there were no regional wall motion abnormalities. Doppler parameters are consistent with abnormal left ventricular relaxation (grade 1 diastolic dysfunction). - Aortic valve: There was no stenosis. - Mitral valve:  Moderately calcified annulus. Mildly calcified leaflets . Trivial regurgitation. - Left atrium: The atrium was mildly dilated. - Tricuspid valve: Peak RV-RA gradient: 27mm Hg (S). - Pulmonary arteries: PA peak pressure: 30mm Hg (S). - Inferior vena cava: The vessel was normal in size; the respirophasic diameter changes were in the normal range (= 50%); findings are consistent with normal central venous pressure. Impressions:  - Normal LV size and systolic function, EF 55-60%. Normal RV size and systolic function. No significant valvular abnormalities.  Catheterization  Indication: Chest Pain  Procedure: After informed consent and clinical "time out" the right groin was prepped and draped in a sterile fashion. A 5Fr sheath was placed in the right femoral artery using seldinger technique and local lidocaine. Standard JL4, JR4 and angled pigtail catheters were used to engage the coronary arteries. Coronary arteries were visualized in orthogonal views using caudal and cranial angulation. RAO ventriculography was done using 28* cc of contrast.  Medications:  Versed: 2 mg's Fentanyl: 0 ug's  Coronary Arteries:  Right dominant with no anomalies  LM: Normal   LAD: 70-80% lesion at first septal perforator distal vessel is small and there Is competitive flow from LIMA  D1: small and normal  Circumflex: normal  OM1: Normal  RCA: Normal proximal and mid 30% distal discrete lesion  PDA: Normal  PLA: Normal  LIMA: Patent to the LAD with cometitive flow that limits distal LAD filling  Ventriculography: EF: 70% %, hyperdynamic  Hemodynamics:  Aortic Pressure: 127 / 66 mmHg LV Pressure: 134 12 mmHg  Impression: Patent LIMA to small LAD. No new disease in RCA or circumflex Normal EF Chest pain not  likely cardiac  D/C home latter today if stable. Mynx used for arteriotomy with good hemostasis  Charlton Hawseter Nishan  07/05/2012  10:45 AM    Assessment / Plan: 1. CAD - no active chest pain - managed  medically and she is encouraged to continue with CV risk factor modification.   2. HTN -  She says she has better control at home with average of 130 systolic. Not really wanting more medicines. I have asked her to monitor at home, keep a diary to bring back for review. See back in 3 months.   3. HLD - no longer on statin but taking Red Yeast Rice - will need repeat labs on return - I reminded her that this is a precursor to the statins and that she will need lipids and liver check on return.    4. Chronic diastolic HF - needs repeat BMET today - I wonder how much salt is playing a role here.   5. Obesity   See back in 3 months. Fasting labs on return. Would really focus on her CV risk factor modification.  Patient is agreeable to this plan and will call if any problems develop in the interim.   Rosalio MacadamiaLori C. Nargis Abrams, RN, ANP-C [facility] [address] [place], [place]  [id number] [phone]

## 2014-07-06 NOTE — Patient Instructions (Addendum)
We will be checking the following labs today BMET  Stay on your current medicines  See Dr. Clifton JamesMcAlhany in 3 months - with your blood pressure cuff and fasting labs  If you are able to get a new BP cuff - get an Omron - large cuff - and keep a diary  Call the [facility] office at [phone] if you have any questions, problems or concerns.

## 2014-08-17 ENCOUNTER — Other Ambulatory Visit: Payer: Medicare Other

## 2014-08-20 ENCOUNTER — Encounter (HOSPITAL_COMMUNITY): Payer: Self-pay | Admitting: Cardiovascular Disease

## 2014-10-05 ENCOUNTER — Ambulatory Visit: Payer: Medicare Other | Admitting: Cardiovascular Disease

## 2014-10-15 ENCOUNTER — Encounter: Payer: Self-pay | Admitting: Cardiovascular Disease

## 2014-11-06 ENCOUNTER — Ambulatory Visit (INDEPENDENT_AMBULATORY_CARE_PROVIDER_SITE_OTHER): Payer: Medicare Other | Admitting: Ophthalmology

## 2014-11-06 DIAGNOSIS — H43813 Vitreous degeneration, bilateral: Secondary | ICD-10-CM

## 2014-11-06 DIAGNOSIS — H35033 Hypertensive retinopathy, bilateral: Secondary | ICD-10-CM | POA: Diagnosis not present

## 2014-11-06 DIAGNOSIS — H26493 Other secondary cataract, bilateral: Secondary | ICD-10-CM

## 2014-11-06 DIAGNOSIS — H3531 Nonexudative age-related macular degeneration: Secondary | ICD-10-CM | POA: Diagnosis not present

## 2014-11-06 DIAGNOSIS — I10 Essential (primary) hypertension: Secondary | ICD-10-CM

## 2014-11-17 ENCOUNTER — Ambulatory Visit: Payer: BC Managed Care – PPO | Admitting: Cardiovascular Disease

## 2014-11-18 ENCOUNTER — Ambulatory Visit (INDEPENDENT_AMBULATORY_CARE_PROVIDER_SITE_OTHER): Payer: Medicare Other | Admitting: Ophthalmology

## 2014-11-18 DIAGNOSIS — H26492 Other secondary cataract, left eye: Secondary | ICD-10-CM

## 2014-11-27 ENCOUNTER — Encounter (INDEPENDENT_AMBULATORY_CARE_PROVIDER_SITE_OTHER): Payer: Medicare Other | Admitting: Ophthalmology

## 2014-11-27 DIAGNOSIS — H2702 Aphakia, left eye: Secondary | ICD-10-CM

## 2014-12-31 ENCOUNTER — Encounter: Payer: Self-pay | Admitting: Cardiovascular Disease

## 2015-01-13 ENCOUNTER — Emergency Department (HOSPITAL_COMMUNITY): Payer: Medicare Other

## 2015-01-13 ENCOUNTER — Emergency Department (HOSPITAL_COMMUNITY)
Admission: EM | Admit: 2015-01-13 | Discharge: 2015-01-13 | Disposition: A | Discharge status: Home / Self Care | Payer: Medicare Other | Attending: Emergency Medicine | Admitting: Emergency Medicine

## 2015-01-13 ENCOUNTER — Encounter (HOSPITAL_COMMUNITY): Payer: Self-pay | Admitting: Emergency Medicine

## 2015-01-13 DIAGNOSIS — J45909 Unspecified asthma, uncomplicated: Secondary | ICD-10-CM | POA: Diagnosis not present

## 2015-01-13 DIAGNOSIS — Z8639 Personal history of other endocrine, nutritional and metabolic disease: Secondary | ICD-10-CM | POA: Diagnosis not present

## 2015-01-13 DIAGNOSIS — R200 Anesthesia of skin: Secondary | ICD-10-CM | POA: Diagnosis not present

## 2015-01-13 DIAGNOSIS — I2510 Atherosclerotic heart disease of native coronary artery without angina pectoris: Secondary | ICD-10-CM | POA: Diagnosis not present

## 2015-01-13 DIAGNOSIS — I10 Essential (primary) hypertension: Secondary | ICD-10-CM | POA: Insufficient documentation

## 2015-01-13 DIAGNOSIS — I5032 Chronic diastolic (congestive) heart failure: Secondary | ICD-10-CM | POA: Diagnosis not present

## 2015-01-13 DIAGNOSIS — Z79899 Other long term (current) drug therapy: Secondary | ICD-10-CM | POA: Insufficient documentation

## 2015-01-13 DIAGNOSIS — Z7982 Long term (current) use of aspirin: Secondary | ICD-10-CM | POA: Insufficient documentation

## 2015-01-13 DIAGNOSIS — Z792 Long term (current) use of antibiotics: Secondary | ICD-10-CM | POA: Diagnosis not present

## 2015-01-13 DIAGNOSIS — J0110 Acute frontal sinusitis, unspecified: Secondary | ICD-10-CM | POA: Diagnosis not present

## 2015-01-13 DIAGNOSIS — Z87891 Personal history of nicotine dependence: Secondary | ICD-10-CM | POA: Insufficient documentation

## 2015-01-13 DIAGNOSIS — Z951 Presence of aortocoronary bypass graft: Secondary | ICD-10-CM | POA: Insufficient documentation

## 2015-01-13 DIAGNOSIS — Z880 Allergy status to penicillin: Secondary | ICD-10-CM | POA: Diagnosis not present

## 2015-01-13 DIAGNOSIS — Z9889 Other specified postprocedural states: Secondary | ICD-10-CM | POA: Insufficient documentation

## 2015-01-13 DIAGNOSIS — R519 Headache, unspecified: Secondary | ICD-10-CM

## 2015-01-13 DIAGNOSIS — Z8719 Personal history of other diseases of the digestive system: Secondary | ICD-10-CM | POA: Diagnosis not present

## 2015-01-13 DIAGNOSIS — Z91040 Latex allergy status: Secondary | ICD-10-CM | POA: Diagnosis not present

## 2015-01-13 DIAGNOSIS — R51 Headache: Secondary | ICD-10-CM | POA: Diagnosis present

## 2015-01-13 LAB — COMPREHENSIVE METABOLIC PANEL
ALBUMIN: 3.8 g/dL (ref 3.5–5.0)
ALK PHOS: 73 U/L (ref 38–126)
ALT: 22 U/L (ref 14–54)
ANION GAP: 11 (ref 5–15)
AST: 28 U/L (ref 15–41)
BILIRUBIN TOTAL: 1 mg/dL (ref 0.3–1.2)
BUN: 19 mg/dL (ref 6–20)
CALCIUM: 9.5 mg/dL (ref 8.9–10.3)
CO2: 24 mmol/L (ref 22–32)
CREATININE: 1.22 mg/dL — AB (ref 0.44–1.00)
Chloride: 104 mmol/L (ref 101–111)
GFR calc Af Amer: 52 mL/min — ABNORMAL LOW (ref >60)
GFR calc non Af Amer: 44 mL/min — ABNORMAL LOW (ref >60)
GLUCOSE: 108 mg/dL — AB (ref 70–99)
Potassium: 4.4 mmol/L (ref 3.5–5.1)
Sodium: 139 mmol/L (ref 135–145)
Total Protein: 7.3 g/dL (ref 6.5–8.1)

## 2015-01-13 LAB — CBC WITH DIFFERENTIAL/PLATELET
BASOS ABS: 0 10*3/uL (ref 0.0–0.1)
BASOS PCT: 0 % (ref 0–1)
EOS PCT: 1 % (ref 0–5)
Eosinophils Absolute: 0.1 10*3/uL (ref 0.0–0.7)
HCT: 37.8 % (ref 36.0–46.0)
Hemoglobin: 12.5 g/dL (ref 12.0–15.0)
LYMPHS PCT: 24 % (ref 12–46)
Lymphs Abs: 2.3 10*3/uL (ref 0.7–4.0)
MCH: 29.5 pg (ref 26.0–34.0)
MCHC: 33.1 g/dL (ref 30.0–36.0)
MCV: 89.2 fL (ref 78.0–100.0)
Monocytes Absolute: 0.5 10*3/uL (ref 0.1–1.0)
Monocytes Relative: 5 % (ref 3–12)
NEUTROS ABS: 7 10*3/uL (ref 1.7–7.7)
Neutrophils Relative %: 70 % (ref 43–77)
Platelets: 242 10*3/uL (ref 150–400)
RBC: 4.24 MIL/uL (ref 3.87–5.11)
RDW: 13.3 % (ref 11.5–15.5)
WBC: 9.9 10*3/uL (ref 4.0–10.5)

## 2015-01-13 LAB — I-STAT TROPONIN, ED: TROPONIN I, POC: 0 ng/mL (ref 0.00–0.08)

## 2015-01-13 LAB — URINALYSIS, ROUTINE W REFLEX MICROSCOPIC
BILIRUBIN URINE: NEGATIVE
Glucose, UA: NEGATIVE mg/dL
HGB URINE DIPSTICK: NEGATIVE
Ketones, ur: 15 mg/dL — AB
NITRITE: NEGATIVE
PH: 5 (ref 5.0–8.0)
Protein, ur: NEGATIVE mg/dL
Specific Gravity, Urine: 1.016 (ref 1.005–1.030)
UROBILINOGEN UA: 0.2 mg/dL (ref 0.0–1.0)

## 2015-01-13 LAB — URINE MICROSCOPIC-ADD ON

## 2015-01-13 MED ORDER — ONDANSETRON 4 MG PO TBDP
4.0000 mg | ORAL_TABLET | Freq: Once | ORAL | Status: AC
  Administered 2015-01-13: 4 mg via ORAL
  Filled 2015-01-13: qty 1

## 2015-01-13 MED ORDER — SULFAMETHOXAZOLE-TRIMETHOPRIM 800-160 MG PO TABS: 1.0000 | ORAL_TABLET | Freq: Two times a day (BID) | ORAL | Status: DC

## 2015-01-13 MED ORDER — HYDROMORPHONE HCL 1 MG/ML IJ SOLN
0.5000 mg | Freq: Once | INTRAMUSCULAR | Status: AC
  Administered 2015-01-13: 0.5 mg via INTRAVENOUS
  Filled 2015-01-13: qty 1

## 2015-01-13 MED ORDER — LEVOFLOXACIN 500 MG PO TABS: 500.0000 mg | ORAL_TABLET | Freq: Every day | ORAL | Status: DC

## 2015-01-13 MED ORDER — ONDANSETRON HCL 4 MG/2ML IJ SOLN
4.0000 mg | Freq: Once | INTRAMUSCULAR | Status: AC
  Administered 2015-01-13: 4 mg via INTRAVENOUS
  Filled 2015-01-13: qty 2

## 2015-01-13 NOTE — Discharge Instructions (Signed)
Follow up with your md next week.  Drink plenty of fluids °

## 2015-01-13 NOTE — ED Notes (Signed)
RN discussed pt's symptoms with EDP. Orders received.

## 2015-01-13 NOTE — ED Provider Notes (Signed)
CSN: [id number]     Arrival date & time 01/13/15  1440 History   First MD Initiated Contact with Patient 01/13/15 1514     Chief Complaint  Patient presents with  . Numbness  . Headache     (Consider location/radiation/quality/duration/timing/severity/associated sxs/prior Treatment) Patient is a 69 y.o. female presenting with headaches. The history is provided by the patient (pt complains of a frontal headache and weakness).  Headache Pain location:  Frontal Quality:  Dull Radiates to:  Does not radiate Severity currently:  8/10 Onset quality:  Gradual Timing:  Constant Progression:  Worsening Chronicity:  New Similar to prior headaches: no   Context: not activity   Associated symptoms: no abdominal pain, no back pain, no congestion, no cough, no diarrhea, no fatigue, no seizures and no sinus pressure     Past Medical History  Diagnosis Date  . Coronary artery disease     a. s/p CABG x 1 2010:  LIMA->LAD.;  b. amdx for CP => LHC 07/04/12: LAD 70-80%, mid RCA 30%, LIMA-LAD patent with competitive flow limiting distal LAD filling, EF 70% with hyperdynamic LV function. Medical therapy continued.  . Hyperlipidemia   . Hypertension   . Chronic diastolic CHF (congestive heart failure)     a. 02/2011 Echo: EF 55-60%, Gr2 DD, Mild MR  . GERD (gastroesophageal reflux disease)   . Dyslipidemia   . Mitral regurgitation     a. mild by echo 02/2011.  . Asthma    Past Surgical History  Procedure Laterality Date  . Cardiac catheterization  07/27/09 & 07/28/09  . Cesarean section    . Cesarean section    . Vesicovaginal fistula closure w/ tah  1998  . Gallbladder surgery  2001  . Shoulder surgery  1998 / 2001    from accident  . Coronary artery bypass graft  08/03/2009    x1 using left internal mammary artery to distal left anterior  descending coronary artery.   . Left heart catheterization with coronary/graft angiogram N/A 07/05/2012    Procedure: LEFT HEART CATHETERIZATION WITH  Isabel CapriceORONARY/GRAFT ANGIOGRAM;  Surgeon: Wendall StadePeter C Nishan, MD;  [facility];  Service: Cardiovascular;  Laterality: N/A;   Family History  Problem Relation Age of Onset  . Cancer Father   . Heart attack Father   . Heart attack Mother   . Heart disease Mother     had pacemaker  . Cancer Brother   . Heart disease Brother   . Heart disease Sister   . Cancer Sister   . Angina Sister   . Coronary artery disease Son   . Hypertension Sister   . Hypertension Brother    History  Substance Use Topics  . Smoking status: Former Smoker -- 7 years    Types: Cigarettes    Quit date: 07/12/2010  . Smokeless tobacco: Never Used  . Alcohol Use: No   OB History    No data available     Review of Systems  Constitutional: Negative for appetite change and fatigue.  HENT: Negative for congestion, ear discharge and sinus pressure.   Eyes: Negative for discharge.  Respiratory: Negative for cough.   Cardiovascular: Negative for chest pain.  Gastrointestinal: Negative for abdominal pain and diarrhea.  Genitourinary: Negative for frequency and hematuria.  Musculoskeletal: Negative for back pain.  Skin: Negative for rash.  Neurological: Positive for headaches. Negative for seizures.  Psychiatric/Behavioral: Negative for hallucinations.      Allergies  Ceclor; Hctz; Lipitor; Norvasc; Penicillins; Sulfa antibiotics;  Statins; Erythromycin; Latex; and Tetracyclines & related  Home Medications   Prior to Admission medications   Medication Sig Start Date End Date Taking? Authorizing Provider  albuterol (PROVENTIL HFA;VENTOLIN HFA) 108 (90 BASE) MCG/ACT inhaler Inhale 1-2 puffs into the lungs every 6 (six) hours as needed for wheezing or shortness of breath.   Yes Historical Provider, MD  anti-nausea (EMETROL) solution Take 30 mLs by mouth every 15 (fifteen) minutes as needed for nausea or vomiting.   Yes Historical Provider, MD  aspirin 81 MG tablet Take 81 mg by mouth daily. 01/15/12  Yes  Kathleene Hazelhristopher D McAlhany, MD  calcium carbonate (OS-CAL) 600 MG TABS tablet Take 600 mg by mouth 2 (two) times daily with a meal.   Yes Historical Provider, MD  Cholecalciferol 1000 UNITS capsule Take 1,000 Units by mouth daily.   Yes Historical Provider, MD  furosemide (LASIX) 20 MG tablet Take 1 tablet (20 mg total) by mouth daily. 07/06/14  Yes Rosalio MacadamiaLori C Gerhardt, NP  lisinopril (PRINIVIL,ZESTRIL) 40 MG tablet Take 1 tablet (40 mg total) by mouth daily. 07/06/14  Yes Rosalio MacadamiaLori C Gerhardt, NP  metoprolol (LOPRESSOR) 50 MG tablet Take 1.5 tablets (75 mg total) by mouth 2 (two) times daily. 07/06/14  Yes Rosalio MacadamiaLori C Gerhardt, NP  Multiple Vitamin (MULTIVITAMIN WITH MINERALS) TABS tablet Take 1 tablet by mouth daily.   Yes Historical Provider, MD  Multiple Vitamins-Minerals (PRESERVISION AREDS 2 PO) Take 1 capsule by mouth 2 (two) times daily.   Yes Historical Provider, MD  nitroGLYCERIN (NITROSTAT) 0.4 MG SL tablet Place 1 tablet (0.4 mg total) under the tongue every 5 (five) minutes as needed for chest pain. 07/06/14  Yes Rosalio MacadamiaLori C Gerhardt, NP  levofloxacin (LEVAQUIN) 500 MG tablet Take 1 tablet (500 mg total) by mouth daily. 01/13/15   Bethann BerkshireJoseph Karri Kallenbach, MD   BP 134/84 mmHg  Pulse 70  Temp(Src) 98.5 F (36.9 C) (Oral)  Resp 16  SpO2 100% Physical Exam  Constitutional: She is oriented to person, place, and time. She appears well-developed.  HENT:  Head: Normocephalic.  Tender frontal and max sinuses  Eyes: Conjunctivae and EOM are normal. No scleral icterus.  Neck: Neck supple. No thyromegaly present.  Cardiovascular: Normal rate and regular rhythm.  Exam reveals no gallop and no friction rub.   No murmur heard. Pulmonary/Chest: No stridor. She has no wheezes. She has no rales. She exhibits no tenderness.  Abdominal: She exhibits no distension. There is no tenderness. There is no rebound.  Musculoskeletal: Normal range of motion. She exhibits no edema.  Lymphadenopathy:    She has no cervical adenopathy.   Neurological: She is oriented to person, place, and time. She exhibits normal muscle tone. Coordination normal.  Skin: No rash noted. No erythema.  Psychiatric: She has a normal mood and affect. Her behavior is normal.    ED Course  Procedures (including critical care time) Labs Review Labs Reviewed  COMPREHENSIVE METABOLIC PANEL - Abnormal; Notable for the following:    Glucose, Bld 108 (*)    Creatinine, Ser 1.22 (*)    GFR calc non Af Amer 44 (*)    GFR calc Af Amer 52 (*)    All other components within normal limits  URINALYSIS, ROUTINE W REFLEX MICROSCOPIC - Abnormal; Notable for the following:    Color, Urine GREEN (*)    Ketones, ur 15 (*)    Leukocytes, UA SMALL (*)    All other components within normal limits  CBC WITH DIFFERENTIAL/PLATELET  URINE MICROSCOPIC-ADD  ON  Rosezena Sensor, ED    Imaging Review Dg Chest 2 View  01/13/2015   CLINICAL DATA:  69 year old female with headache, bilateral finger numbness and tingling. Initial encounter.  EXAM: CHEST  2 VIEW  COMPARISON:  10/06/2013 and earlier.  FINDINGS: Stable lung volumes. Normal cardiac size and mediastinal contours. Sequelae of CABG again noted. No pneumothorax, pulmonary edema, pleural effusion or acute pulmonary opacity. Chronic calcified granulomas and small mediastinal lymph nodes again noted. Visualized tracheal air column is within normal limits. No acute osseous abnormality identified. Stable cholecystectomy clips.  IMPRESSION: Stable including remote granulomatous disease. No acute cardiopulmonary abnormality.   Electronically Signed   By: Odessa Fleming M.D.   On: 01/13/2015 15:23   Ct Head Wo Contrast  01/13/2015   CLINICAL DATA:  Headaches and dizziness.  EXAM: CT HEAD WITHOUT CONTRAST  TECHNIQUE: Contiguous axial images were obtained from the base of the skull through the vertex without intravenous contrast.  COMPARISON:  10/02/2006  FINDINGS: There is no evidence of acute cortical infarct, intracranial  hemorrhage, mass, midline shift, or extra-axial fluid collection. Ventricles and sulci are normal.  Prior bilateral ocular lens extractions. Mastoid air cells are clear. An air-fluid level is partially visualized in the superior aspect of the left maxillary sinus, similar to the prior study, with osseous wall thickening of the sinus suggestive of chronic sinusitis.  IMPRESSION: 1. No evidence of acute intracranial abnormality. 2. Left maxillary sinus air-fluid level, possibly reflecting acute on chronic sinusitis.   Electronically Signed   By: Sebastian Ache   On: 01/13/2015 15:48     EKG Interpretation   Date/Time:  Wednesday Jan 13 2015 14:44:45 EDT Ventricular Rate:  82 PR Interval:  166 QRS Duration: 122 QT Interval:  396 QTC Calculation: 462 R Axis:   46 Text Interpretation:  Normal sinus rhythm Right bundle branch block  Abnormal ECG Confirmed by Pacen Watford  MD, Donatello Kleve ([id number]) on 01/13/2015 5:57:02  PM      MDM   Final diagnoses:  Acute frontal sinusitis, recurrence not specified    Sinusiits,  tx with levaquin and follow up with pcp    Bethann Berkshire, MD 01/13/15 1811

## 2015-01-13 NOTE — ED Notes (Addendum)
Pt felt fine last night; went swimming. Woke up this morning around 0600. Pt has episode of fingers getting numb bilaterally for 5 mins around 1:30. Resolved when she put in hot water. Dry heaving; headache; nausea. Right side pain in hip area. 1 episode of vomiting and chills. Been Went out to run and errand and then experienced severe headache. Is on meds for hypertension. Pt is very thirsty and feels like she may be dehydrated. Sugar checked at physical in March. Denies diabetes. Pt had cardiac surgery years ago for blockage.

## 2015-01-21 NOTE — Progress Notes (Signed)
Chief Complaint  Patient presents with  . Follow-up   History of Present Illness: 69 yo female a hx of CAD s/p 1V CABG in 2010, HTN, HLD, GERD, mild MR here today for cardiac follow up. She was admitted 06/2012 with chest pain and ruled out for MI. LHC 07/04/12: LAD 70-80%, mid RCA 30%, LIMA-LAD patent with competitive flow limiting distal LAD filling, EF 70% with hyperdynamic LV function. Medical therapy was continued. She was seen in the ED at [facility] 10/06/13 with chest pain. Her BP was 190/120 in the ED. Troponin negative. EKG without ischemic changes. She has had continued mild chest pain at times. No energy when on the treadmill and swimming. She had her Lisinopril increased to 40 mg daily in primary care and this seemed to upset her stomach. I lowered her Lisinopril to 20 mg daily and added Maxzide. She has not tolerated Norvasc in the past. The plan was to attempt better BP control and then re-evaluate her chest pain. Echo 10/28/13 with normal LV function and no significant valve disease. HCTZ was stopped in primary care due to renal insufficiency. I saw her in September 2015 and she c/o dyspnea with ambulation. Lasix was added and at f/u visit here in October 2015 she felt much better. She does not tolerate statins.   She is here today for follow up. She denies chest pain or SOB. Feeling well. Swimming and walking.   Primary Care Physician: Mila PalmerSharon Wolters  Last Lipid Profile:Lipid Panel     Component Value Date/Time   CHOL 168 06/04/2014 0944   TRIG 71.0 06/04/2014 0944   HDL 56.70 06/04/2014 0944   CHOLHDL 3 06/04/2014 0944   VLDL 14.2 06/04/2014 0944   LDLCALC 97 06/04/2014 0944    Past Medical History  Diagnosis Date  . Coronary artery disease     a. s/p CABG x 1 2010:  LIMA->LAD.;  b. amdx for CP => LHC 07/04/12: LAD 70-80%, mid RCA 30%, LIMA-LAD patent with competitive flow limiting distal LAD filling, EF 70% with hyperdynamic LV function. Medical therapy continued.  .  Hyperlipidemia   . Hypertension   . Chronic diastolic CHF (congestive heart failure)     a. 02/2011 Echo: EF 55-60%, Gr2 DD, Mild MR  . GERD (gastroesophageal reflux disease)   . Dyslipidemia   . Mitral regurgitation     a. mild by echo 02/2011.  . Asthma     Past Surgical History  Procedure Laterality Date  . Cardiac catheterization  07/27/09 & 07/28/09  . Cesarean section    . Cesarean section    . Vesicovaginal fistula closure w/ tah  1998  . Gallbladder surgery  2001  . Shoulder surgery  1998 / 2001    from accident  . Coronary artery bypass graft  08/03/2009    x1 using left internal mammary artery to distal left anterior  descending coronary artery.   . Left heart catheterization with coronary/graft angiogram N/A 07/05/2012    Procedure: LEFT HEART CATHETERIZATION WITH Isabel CapriceORONARY/GRAFT ANGIOGRAM;  Surgeon: Wendall StadePeter C Nishan, MD;  [facility];  Service: Cardiovascular;  Laterality: N/A;    Current Outpatient Prescriptions  Medication Sig Dispense Refill  . furosemide (LASIX) 20 MG tablet Take 1 tablet (20 mg total) by mouth daily. 90 tablet 3  . albuterol (PROVENTIL HFA;VENTOLIN HFA) 108 (90 BASE) MCG/ACT inhaler Inhale 1-2 puffs into the lungs every 6 (six) hours as needed for wheezing or shortness of breath.    Marland Kitchen. aspirin  81 MG tablet Take 81 mg by mouth daily.    . calcium carbonate (OS-CAL) 600 MG TABS tablet Take 600 mg by mouth 2 (two) times daily with a meal.    . lisinopril (PRINIVIL,ZESTRIL) 40 MG tablet Take 1 tablet (40 mg total) by mouth daily. 90 tablet 3  . metoprolol (LOPRESSOR) 50 MG tablet Take 1.5 tablets (75 mg total) by mouth 2 (two) times daily. 270 tablet 3  . Multiple Vitamin (MULTIVITAMIN WITH MINERALS) TABS tablet Take 1 tablet by mouth daily.    . nitroGLYCERIN (NITROSTAT) 0.4 MG SL tablet Place 1 tablet (0.4 mg total) under the tongue every 5 (five) minutes as needed for chest pain. 90 tablet 3   No current facility-administered medications for  this visit.    Allergies  Allergen Reactions  . Ceclor [Cefaclor] Other (See Comments)    Lost vision in eye  . Hctz [Hydrochlorothiazide] Other (See Comments)    Renal insufficiency  . Lipitor [Atorvastatin Calcium] Other (See Comments)    Increased A1C  . Norvasc [Amlodipine] Other (See Comments)    Makes patient stiff  . Penicillins Hives, Shortness Of Breath, Itching and Rash  . Sulfa Antibiotics Other (See Comments)    CAUSES SHOCK  . Statins Other (See Comments)    MYALGIAS AND WEAKNESS  . Erythromycin Rash  . Latex Rash  . Levofloxacin Rash  . Tetracyclines & Related Rash    History   Social History  . Marital Status: Married    Spouse Name: N/A  . Number of Children: N/A  . Years of Education: N/A   Occupational History  . Not on file.   Social History Main Topics  . Smoking status: Former Smoker -- 7 years    Types: Cigarettes    Quit date: 07/12/2010  . Smokeless tobacco: Never Used  . Alcohol Use: No  . Drug Use: No  . Sexual Activity: Not on file   Other Topics Concern  . Not on file   Social History Narrative    Family History  Problem Relation Age of Onset  . Cancer Father   . Heart attack Father   . Heart attack Mother   . Heart disease Mother     had pacemaker  . Cancer Brother   . Heart disease Brother   . Heart disease Sister   . Cancer Sister   . Angina Sister   . Coronary artery disease Son   . Hypertension Sister   . Hypertension Brother     Review of Systems:  As stated in the HPI and otherwise negative.   BP 140/80 mmHg  Pulse 61  Ht  (1.6 m)  Wt 208 lb 12.8 oz (94.711 kg)  BMI 37.00 kg/m2  Physical Examination: General: Well developed, well nourished, NAD HEENT: OP clear, mucus membranes moist SKIN: warm, dry. No rashes. Neuro: No focal deficits Musculoskeletal: Muscle strength 5/5 all ext Psychiatric: Mood and affect normal Neck: No JVD, no carotid bruits, no thyromegaly, no lymphadenopathy. Lungs:Clear  bilaterally, no wheezes, rhonci, crackles Cardiovascular: Regular rate and rhythm. Soft systolic murmur. No gallops or rubs. Abdomen:Soft. Bowel sounds present. Non-tender.  Extremities: No lower extremity edema. Pulses are 2 + in the bilateral DP/PT.  Echo 10/28/13: Left ventricle: The cavity size was normal. Wall thickness was normal. Systolic function was normal. The estimated ejection fraction was in the range of 55% to 60%. Wall motion was normal; there were no regional wall motion abnormalities. Doppler parameters are consistent with abnormal  left ventricular relaxation (grade 1 diastolic dysfunction). - Aortic valve: There was no stenosis. - Mitral valve: Moderately calcified annulus. Mildly calcified leaflets . Trivial regurgitation. - Left atrium: The atrium was mildly dilated. - Tricuspid valve: Peak RV-RA gradient: 27mm Hg (S). - Pulmonary arteries: PA peak pressure: 30mm Hg (S). - Inferior vena cava: The vessel was normal in size; the respirophasic diameter changes were in the normal range (= 50%); findings are consistent with normal central venous pressure. Impressions: - Normal LV size and systolic function, EF 55-60%. Normal RV size and systolic function. No significant valvular Abnormalities.  EKG:  EKG is not ordered today. The ekg ordered today demonstrates   Recent Labs: 01/13/2015: ALT 22; BUN 19; Creatinine 1.22*; Hemoglobin 12.5; Platelets 242; Potassium 4.4; Sodium 139   Lipid Panel    Component Value Date/Time   CHOL 168 06/04/2014 0944   TRIG 71.0 06/04/2014 0944   HDL 56.70 06/04/2014 0944   CHOLHDL 3 06/04/2014 0944   VLDL 14.2 06/04/2014 0944   LDLCALC 97 06/04/2014 0944     Wt Readings from Last 3 Encounters:  01/22/15 208 lb 12.8 oz (94.711 kg)  07/06/14 209 lb (94.802 kg)  05/25/14 210 lb 6.4 oz (95.437 kg)     Other studies Reviewed: Additional studies/ records that were reviewed today include: . Review of the above records  demonstrates:   Assessment and Plan:   1. CAD: Stable. I do not think her dyspnea is related to ischemia. She has had 1V CABG with LIMA to LAD. Last cath 10/13 with patent LIMA to LAD, minimal disease RCA, no disease Circumflex. Continue medical management.   2. HTN: BP is controlled at home. No changes. She has not tolerated Norvasc in the past due to joint stiffness.   3. Hyperlipidemia: She refuses to take statins due to muscle aches. She stopped Zetia due to cost. We discussed PCSK9 inh but she does not wish to consider at this time.    4. Mitral regurgitation: No significant regurgitation by echo February 2015.   5. Chronic diastolic CHF: Weight stable in Lasix 20 mg daily.   Current medicines are reviewed at length with the patient today.  The patient does not have concerns regarding medicines.  The following changes have been made:  no change  Labs/ tests ordered today include:  No orders of the defined types were placed in this encounter.   Disposition:   FU with me in 12  months  Signed, Verne Carrowhristopher McAlhany, MD 01/22/2015 10:36 AM    [facility] [address], [place], [place]  [address] Phone: [phone]; Fax: [phone]) [phone]

## 2015-01-22 ENCOUNTER — Other Ambulatory Visit: Payer: BC Managed Care – PPO | Admitting: *Deleted

## 2015-01-22 ENCOUNTER — Ambulatory Visit (INDEPENDENT_AMBULATORY_CARE_PROVIDER_SITE_OTHER): Payer: Medicare Other | Admitting: Cardiovascular Disease

## 2015-01-22 ENCOUNTER — Encounter: Payer: Self-pay | Admitting: Cardiovascular Disease

## 2015-01-22 VITALS — BP 140/80 | HR 61 | Ht 63.0 in | Wt 208.8 lb

## 2015-01-22 DIAGNOSIS — I2510 Atherosclerotic heart disease of native coronary artery without angina pectoris: Secondary | ICD-10-CM

## 2015-01-22 DIAGNOSIS — I340 Nonrheumatic mitral (valve) insufficiency: Secondary | ICD-10-CM

## 2015-01-22 DIAGNOSIS — I5032 Chronic diastolic (congestive) heart failure: Secondary | ICD-10-CM

## 2015-01-22 DIAGNOSIS — I10 Essential (primary) hypertension: Secondary | ICD-10-CM

## 2015-01-22 DIAGNOSIS — E785 Hyperlipidemia, unspecified: Secondary | ICD-10-CM

## 2015-01-22 MED ORDER — FUROSEMIDE 20 MG PO TABS: 20.0000 mg | ORAL_TABLET | Freq: Every day | ORAL | Status: DC

## 2015-01-22 NOTE — Patient Instructions (Signed)
Medication Instructions:  Your physician recommends that you continue on your current medications as directed. Please refer to the Current Medication list given to you today.   Labwork: none  Testing/Procedures: none  Follow-Up: Your physician wants you to follow-up in:  12 months.  You will receive a reminder letter in the mail two months in advance. If you don't receive a letter, please call our office to schedule the follow-up appointment.        

## 2015-05-26 ENCOUNTER — Other Ambulatory Visit: Payer: Self-pay | Admitting: Interventional Cardiology

## 2015-05-26 ENCOUNTER — Other Ambulatory Visit: Payer: Self-pay | Admitting: *Deleted

## 2015-05-26 DIAGNOSIS — I10 Essential (primary) hypertension: Secondary | ICD-10-CM

## 2015-05-26 MED ORDER — LISINOPRIL 40 MG PO TABS: 40.0000 mg | ORAL_TABLET | Freq: Every day | ORAL | Status: DC

## 2015-08-13 ENCOUNTER — Telehealth: Payer: Self-pay | Admitting: Cardiovascular Disease

## 2015-08-13 NOTE — Telephone Encounter (Signed)
New message     Pt want to talk to a nurse.  She would not tell me what she wanted

## 2015-08-13 NOTE — Telephone Encounter (Signed)
Left message to call back  

## 2015-08-17 NOTE — Telephone Encounter (Signed)
Left another message to call back

## 2015-08-26 ENCOUNTER — Other Ambulatory Visit: Payer: Self-pay | Admitting: *Deleted

## 2015-08-26 ENCOUNTER — Telehealth: Payer: Self-pay | Admitting: Cardiovascular Disease

## 2015-08-26 MED ORDER — METOPROLOL TARTRATE 50 MG PO TABS: ORAL_TABLET | ORAL | Status: DC

## 2015-08-26 NOTE — Telephone Encounter (Signed)
New Message   Pt was having leg cramps and she discontinued the medication Furosemide  For about 3 months and it has stopped her pain   She just want Physician to be aware

## 2015-08-26 NOTE — Telephone Encounter (Signed)
Pt called to let Dr. Clifton JamesMcalhany know that she had stop taken Lasix 20 mg once daily due to leg cramps. Pt said the she has been taking lasix one pill every 3 to 4 days instead every day due to fluid retention . The last time she took lasix 20 mg was 4 days ago and that night she had severe leg crams. Pt  will stop taking it. Pt denies any edema at this time.

## 2015-08-26 NOTE — Telephone Encounter (Signed)
OK. Thanks.

## 2015-08-27 NOTE — Telephone Encounter (Signed)
See phone note dated 08/26/15 which addresses this call.

## 2015-12-01 ENCOUNTER — Ambulatory Visit (INDEPENDENT_AMBULATORY_CARE_PROVIDER_SITE_OTHER): Payer: Medicare Other | Admitting: Ophthalmology

## 2015-12-15 ENCOUNTER — Ambulatory Visit (INDEPENDENT_AMBULATORY_CARE_PROVIDER_SITE_OTHER): Payer: Medicare Other | Admitting: Ophthalmology

## 2016-01-24 ENCOUNTER — Ambulatory Visit (INDEPENDENT_AMBULATORY_CARE_PROVIDER_SITE_OTHER): Payer: Medicare Other | Admitting: Ophthalmology

## 2016-01-24 DIAGNOSIS — H35033 Hypertensive retinopathy, bilateral: Secondary | ICD-10-CM | POA: Diagnosis not present

## 2016-01-24 DIAGNOSIS — H43813 Vitreous degeneration, bilateral: Secondary | ICD-10-CM

## 2016-01-24 DIAGNOSIS — H353132 Nonexudative age-related macular degeneration, bilateral, intermediate dry stage: Secondary | ICD-10-CM

## 2016-01-24 DIAGNOSIS — I10 Essential (primary) hypertension: Secondary | ICD-10-CM

## 2016-02-23 ENCOUNTER — Other Ambulatory Visit: Payer: Self-pay | Admitting: *Deleted

## 2016-02-23 MED ORDER — METOPROLOL TARTRATE 50 MG PO TABS: ORAL_TABLET | ORAL | Status: DC

## 2016-05-24 ENCOUNTER — Other Ambulatory Visit: Payer: Self-pay | Admitting: *Deleted

## 2016-05-24 MED ORDER — METOPROLOL TARTRATE 50 MG PO TABS: ORAL_TABLET | ORAL | 0 refills | Status: DC

## 2016-05-31 ENCOUNTER — Other Ambulatory Visit: Payer: Self-pay | Admitting: *Deleted

## 2016-05-31 DIAGNOSIS — I25810 Atherosclerosis of coronary artery bypass graft(s) without angina pectoris: Secondary | ICD-10-CM

## 2016-05-31 MED ORDER — NITROGLYCERIN 0.4 MG SL SUBL: 0.4000 mg | SUBLINGUAL_TABLET | SUBLINGUAL | 1 refills | Status: DC | PRN

## 2016-06-01 ENCOUNTER — Other Ambulatory Visit: Payer: Self-pay | Admitting: *Deleted

## 2016-06-01 DIAGNOSIS — I10 Essential (primary) hypertension: Secondary | ICD-10-CM

## 2016-06-01 MED ORDER — LISINOPRIL 40 MG PO TABS: 40.0000 mg | ORAL_TABLET | Freq: Every day | ORAL | 0 refills | Status: DC

## 2016-06-13 NOTE — Progress Notes (Signed)
Chief Complaint  Patient presents with  . Coronary Artery Disease   History of Present Illness: 70 yo female a hx of CAD s/p 1V CABG in 2010, HTN, HLD, GERD, mild MR here today for cardiac follow up. She was admitted 06/2012 with chest pain and ruled out for MI. LHC 07/04/12: LAD 70-80%, mid RCA 30%, LIMA-LAD patent with competitive flow limiting distal LAD filling, EF 70% with hyperdynamic LV function. Medical therapy was continued. She has not tolerated Norvasc in the past. Echo 10/28/13 with normal LV function and no significant valve disease. HCTZ was stopped in primary care due to renal insufficiency. I saw her in September 2015 and she c/o dyspnea with ambulation. Lasix was added and at f/u visit here in October 2015 she felt much better. She does not tolerate statins.   She is here today for follow up. She denies chest pain or SOB. She just got back from [place] with her family. She walked all week with no trouble.   Primary Care Physician: Emeterio Reeve, MD   Past Medical History:  Diagnosis Date  . Asthma   . Chronic diastolic CHF (congestive heart failure) (HCC)    a. 02/2011 Echo: EF 55-60%, Gr2 DD, Mild MR  . Coronary artery disease    a. s/p CABG x 1 2010:  LIMA->LAD.;  b. amdx for CP => LHC 07/04/12: LAD 70-80%, mid RCA 30%, LIMA-LAD patent with competitive flow limiting distal LAD filling, EF 70% with hyperdynamic LV function. Medical therapy continued.  . Dyslipidemia   . GERD (gastroesophageal reflux disease)   . Hyperlipidemia   . Hypertension   . Mitral regurgitation    a. mild by echo 02/2011.    Past Surgical History:  Procedure Laterality Date  . CARDIAC CATHETERIZATION  07/27/09 & 07/28/09  . CESAREAN SECTION    . CESAREAN SECTION    . CORONARY ARTERY BYPASS GRAFT  08/03/2009   x1 using left internal mammary artery to distal left anterior  descending coronary artery.   Marland Kitchen GALLBLADDER SURGERY  2001  . LEFT HEART CATHETERIZATION WITH CORONARY/GRAFT ANGIOGRAM  N/A 07/05/2012   Procedure: LEFT HEART CATHETERIZATION WITH Isabel Caprice;  Surgeon: Wendall Stade, MD;  Location: [facility] CATH LAB;  Service: Cardiovascular;  Laterality: N/A;  . SHOULDER SURGERY  1998 / 2001   from accident  . VESICOVAGINAL FISTULA CLOSURE W/ TAH  1998    Current Outpatient Prescriptions  Medication Sig Dispense Refill  . albuterol (PROVENTIL HFA;VENTOLIN HFA) 108 (90 BASE) MCG/ACT inhaler Inhale 1-2 puffs into the lungs every 6 (six) hours as needed for wheezing or shortness of breath.    Marland Kitchen aspirin 81 MG tablet Take 81 mg by mouth daily.    . calcium carbonate (OS-CAL) 600 MG TABS tablet Take 600 mg by mouth 2 (two) times daily with a meal.    . lisinopril (PRINIVIL,ZESTRIL) 40 MG tablet Take 1 tablet (40 mg total) by mouth daily. 90 tablet 3  . metoprolol (LOPRESSOR) 50 MG tablet TAKE 1 & 1/2 TABLETS BY MOUTH 2 TIMES DAILY 90 tablet 0  . Multiple Vitamin (MULTIVITAMIN WITH MINERALS) TABS tablet Take 1 tablet by mouth daily.    . nitroGLYCERIN (NITROSTAT) 0.4 MG SL tablet Place 1 tablet (0.4 mg total) under the tongue every 5 (five) minutes as needed for chest pain. 25 tablet 1   No current facility-administered medications for this visit.     Allergies  Allergen Reactions  . Ceclor [Cefaclor] Other (See Comments)  Lost vision in eye  . Hctz [Hydrochlorothiazide] Other (See Comments)    Renal insufficiency  . Lipitor [Atorvastatin Calcium] Other (See Comments)    Increased A1C  . Norvasc [Amlodipine] Other (See Comments)    Makes patient stiff  . Penicillins Hives, Shortness Of Breath, Itching and Rash  . Sulfa Antibiotics Other (See Comments)    CAUSES SHOCK  . Statins Other (See Comments)    MYALGIAS AND WEAKNESS  . Erythromycin Rash  . Latex Rash  . Levofloxacin Rash  . Tetracyclines & Related Rash    Social History   Social History  . Marital status: Married    Spouse name: N/A  . Number of children: N/A  . Years of education: N/A    Occupational History  . Not on file.   Social History Main Topics  . Smoking status: Former Smoker    Years: 7.00    Types: Cigarettes    Quit date: 07/12/2010  . Smokeless tobacco: Never Used  . Alcohol use No  . Drug use: No  . Sexual activity: Not on file   Other Topics Concern  . Not on file   Social History Narrative  . No narrative on file    Family History  Problem Relation Age of Onset  . Cancer Father   . Heart attack Father   . Heart attack Mother   . Heart disease Mother     had pacemaker  . Cancer Brother   . Heart disease Brother   . Heart disease Sister   . Cancer Sister   . Angina Sister   . Coronary artery disease Son   . Hypertension Sister   . Hypertension Brother     Review of Systems:  As stated in the HPI and otherwise negative.   BP 132/78   Pulse 60   Ht 5\' 4"  (1.626 m)   Wt 212 lb 1.9 oz (96.2 kg)   BMI 36.41 kg/m   Physical Examination: General: Well developed, well nourished, NAD  HEENT: OP clear, mucus membranes moist  SKIN: warm, dry. No rashes. Neuro: No focal deficits  Musculoskeletal: Muscle strength 5/5 all ext  Psychiatric: Mood and affect normal  Neck: No JVD, no carotid bruits, no thyromegaly, no lymphadenopathy.  Lungs:Clear bilaterally, no wheezes, rhonci, crackles Cardiovascular: Regular rate and rhythm. Soft systolic murmur. No gallops or rubs. Abdomen:Soft. Bowel sounds present. Non-tender.  Extremities: No lower extremity edema. Pulses are 2 + in the bilateral DP/PT.  Echo 10/28/13: Left ventricle: The cavity size was normal. Wall thickness was normal. Systolic function was normal. The estimated ejection fraction was in the range of 55% to 60%. Wall motion was normal; there were no regional wall motion abnormalities. Doppler parameters are consistent with abnormal left ventricular relaxation (grade 1 diastolic dysfunction). - Aortic valve: There was no stenosis. - Mitral valve: Moderately calcified annulus.  Mildly calcified leaflets . Trivial regurgitation. - Left atrium: The atrium was mildly dilated. - Tricuspid [id number] [id number] CKariSSpringeChildren'S Rehabilitation CenLaurette SWhitney [id number]92mKore8962[facility] CenteKariSabMargarit([id number] Clinton HospiLaurette SWhitney [id number] TKariSabMargarit670-3[facility] JThe [facility] RegioLaurette SWhitney [id number]8LaveV as normal in size; the respirophasic diameter changes were in the normal range (= 50%); findings are consistent with normal central venous pressure. Impressions: - Normal LV size and systolic function, EF 55-60%. Normal RV size and systolic function. No significant valvular Abnormalities.  EKG:  EKG is ordered today. The ekg ordered today demonstrates NSR, rate 60 bpm. Incomplete RBBB  Recent Labs: No results found for requested labs  within last 8760 hours.   Lipid Panel    Component Value Date/Time   CHOL 168 06/04/2014 0944   TRIG 71.0 06/04/2014 0944   HDL 56.70 06/04/2014 0944   CHOLHDL 3 06/04/2014 0944   VLDL 14.2 06/04/2014 0944   LDLCALC 97 06/04/2014 0944     Wt Readings from Last 3 Encounters:  06/14/16 212 lb 1.9 oz (96.2 kg)  01/22/15 208 lb 12.8 oz (94.7 kg)  07/06/14 209 lb (94.8 kg)     Other studies Reviewed: Additional studies/ records that were reviewed today include: . Review of the above records demonstrates:   Assessment and Plan:   1. CAD: No recent chest pain suggestive of angina. She has had 1V CABG with LIMA to LAD. Last cath 10/13 with patent LIMA to LAD, minimal disease RCA, no disease Circumflex. Continue medical management.   2. HTN: BP is controlled. No changes. She has not tolerated Norvasc in the past due to joint stiffness.   3. Hyperlipidemia: She refuses to take statins due to muscle aches. She stopped Zetia due to cost. We discussed PCSK9 inh but she does not wish to consider at this time.    4. Mitral regurgitation: No significant regurgitation by echo February 2015. She has a systolic murmur. Will repeat echo now.   5. Chronic diastolic CHF: Weight stable. She has not been using Lasix  Current  medicines are reviewed at length with the patient today.  The patient does not have concerns regarding medicines.  The following changes have been made:  no change  Labs/ tests ordered today include:   Orders Placed This Encounter  Procedures  . EKG 12-Lead  . ECHOCARDIOGRAM COMPLETE   Disposition:   FU with me in 12  months  Signed, Verne Carrowhristopher McAlhany, MD 06/14/2016 9:50 AM    [facility] [address], [place], [place]  [address] Phone: [phone](336) [phone]; Fax: [phone]

## 2016-06-14 ENCOUNTER — Encounter: Payer: Self-pay | Admitting: Cardiovascular Disease

## 2016-06-14 ENCOUNTER — Ambulatory Visit (INDEPENDENT_AMBULATORY_CARE_PROVIDER_SITE_OTHER): Payer: Medicare Other | Admitting: Cardiovascular Disease

## 2016-06-14 VITALS — BP 132/78 | HR 60 | Ht 64.0 in | Wt 212.1 lb

## 2016-06-14 DIAGNOSIS — I5032 Chronic diastolic (congestive) heart failure: Secondary | ICD-10-CM

## 2016-06-14 DIAGNOSIS — I2510 Atherosclerotic heart disease of native coronary artery without angina pectoris: Secondary | ICD-10-CM | POA: Diagnosis not present

## 2016-06-14 DIAGNOSIS — I340 Nonrheumatic mitral (valve) insufficiency: Secondary | ICD-10-CM

## 2016-06-14 DIAGNOSIS — E7800 Pure hypercholesterolemia, unspecified: Secondary | ICD-10-CM | POA: Diagnosis not present

## 2016-06-14 DIAGNOSIS — I10 Essential (primary) hypertension: Secondary | ICD-10-CM | POA: Diagnosis not present

## 2016-06-14 MED ORDER — FUROSEMIDE 20 MG PO TABS: 20.0000 mg | ORAL_TABLET | Freq: Every day | ORAL | 3 refills | Status: DC | PRN

## 2016-06-14 MED ORDER — LISINOPRIL 40 MG PO TABS: 40.0000 mg | ORAL_TABLET | Freq: Every day | ORAL | 3 refills | Status: DC

## 2016-06-14 NOTE — Patient Instructions (Addendum)
Medication Instructions:  Your physician recommends that you continue on your current medications as directed. Please refer to the Current Medication list given to you today. May take furosemide 20 mg by mouth daily as needed for swelling, weight gain, shortness of breath   Labwork:  Your physician recommends that you return for lab work on day of echo.  This will be fasting.   Testing/Procedures: Your physician has requested that you have an echocardiogram. Echocardiography is a painless test that uses sound waves to create images of your heart. It provides your doctor with information about the size and shape of your heart and how well your heart's chambers and valves are working. This procedure takes approximately one hour. There are no restrictions for this procedure.    Follow-Up: Your physician wants you to follow-up in: 12 months.  You will receive a reminder letter in the mail two months in advance. If you don't receive a letter, please call our office to schedule the follow-up appointment.   Any Other Special Instructions Will Be Listed Below (If Applicable).     If you need a refill on your cardiac medications before your next appointment, please call your pharmacy.

## 2016-06-14 NOTE — Addendum Note (Signed)
Addended by: Dossie ArbourADELMAN, Marrissa Dai L on: 06/14/2016 09:57 AM   Modules accepted: Orders

## 2016-06-28 ENCOUNTER — Other Ambulatory Visit: Payer: Medicare Other | Admitting: *Deleted

## 2016-06-28 ENCOUNTER — Ambulatory Visit (HOSPITAL_COMMUNITY): Payer: Medicare Other | Attending: Cardiology

## 2016-06-28 ENCOUNTER — Other Ambulatory Visit: Payer: Self-pay

## 2016-06-28 DIAGNOSIS — I340 Nonrheumatic mitral (valve) insufficiency: Secondary | ICD-10-CM | POA: Diagnosis present

## 2016-06-28 DIAGNOSIS — I700 Atherosclerosis of aorta: Secondary | ICD-10-CM | POA: Diagnosis not present

## 2016-06-28 DIAGNOSIS — I5032 Chronic diastolic (congestive) heart failure: Secondary | ICD-10-CM

## 2016-06-28 DIAGNOSIS — I2510 Atherosclerotic heart disease of native coronary artery without angina pectoris: Secondary | ICD-10-CM

## 2016-06-28 DIAGNOSIS — E7800 Pure hypercholesterolemia, unspecified: Secondary | ICD-10-CM

## 2016-06-28 LAB — BASIC METABOLIC PANEL
BUN: 16 mg/dL (ref 7–25)
CO2: 24 mmol/L (ref 20–31)
Calcium: 9.5 mg/dL (ref 8.6–10.4)
Chloride: 109 mmol/L (ref 98–110)
Creat: 1.13 mg/dL — ABNORMAL HIGH (ref 0.50–0.99)
GLUCOSE: 88 mg/dL (ref 65–99)
POTASSIUM: 4.7 mmol/L (ref 3.5–5.3)
Sodium: 139 mmol/L (ref 135–146)

## 2016-06-28 LAB — LIPID PANEL
CHOL/HDL RATIO: 3.3 ratio (ref <=5.0)
CHOLESTEROL: 190 mg/dL (ref 125–200)
HDL: 58 mg/dL (ref >=46)
LDL Cholesterol: 117 mg/dL (ref <130)
Triglycerides: 74 mg/dL (ref <150)
VLDL: 15 mg/dL (ref <30)

## 2016-06-30 ENCOUNTER — Telehealth: Payer: Self-pay | Admitting: Cardiovascular Disease

## 2016-06-30 NOTE — Telephone Encounter (Signed)
New message ° ° °Pt verbalized that she is calling for lab results  °

## 2016-06-30 NOTE — Telephone Encounter (Signed)
Advised pt that we are waiting on Dr. Clifton JamesMcAlhany to review labs and echo and then we will call her with those results.  Pt verbalized understanding and was appreciative for call.

## 2016-08-02 ENCOUNTER — Ambulatory Visit
Admission: RE | Admit: 2016-08-02 | Discharge: 2016-08-02 | Disposition: A | Discharge status: Home / Self Care | Payer: Medicare Other | Source: Ambulatory Visit | Attending: Family Medicine | Admitting: Family Medicine

## 2016-08-02 ENCOUNTER — Other Ambulatory Visit: Payer: Self-pay | Admitting: Family Medicine

## 2016-08-02 DIAGNOSIS — R05 Cough: Secondary | ICD-10-CM

## 2016-08-02 DIAGNOSIS — R059 Cough, unspecified: Secondary | ICD-10-CM

## 2016-09-26 ENCOUNTER — Other Ambulatory Visit: Payer: Self-pay | Admitting: *Deleted

## 2016-09-26 DIAGNOSIS — I10 Essential (primary) hypertension: Secondary | ICD-10-CM

## 2016-09-26 MED ORDER — LISINOPRIL 40 MG PO TABS: 40.0000 mg | ORAL_TABLET | Freq: Every day | ORAL | 2 refills | Status: DC

## 2016-10-23 ENCOUNTER — Other Ambulatory Visit: Payer: Self-pay | Admitting: Cardiovascular Disease

## 2016-10-23 MED ORDER — METOPROLOL TARTRATE 50 MG PO TABS: ORAL_TABLET | ORAL | 2 refills | Status: DC

## 2016-10-24 ENCOUNTER — Telehealth: Payer: Self-pay | Admitting: Cardiovascular Disease

## 2016-10-24 NOTE — Telephone Encounter (Signed)
Called pt and left message informing pt that her medication has been sent to her pharmacy  On 10/23/16 and if she has any other problems, questions or concerns to call our office.

## 2016-10-24 NOTE — Telephone Encounter (Signed)
New Message   *STAT* If patient is at the pharmacy, call can be transferred to refill team.   1. Which medications need to be refilled? (please list name of each medication and dose if known) Metoprolol  2. Which pharmacy/location (including street and city if local pharmacy) is medication to be sent to? [facility]    3. Do they need a 30 day or 90 day supply? 90 day supply

## 2016-12-05 DIAGNOSIS — S91051A Open bite, right ankle, initial encounter: Secondary | ICD-10-CM | POA: Diagnosis not present

## 2016-12-05 DIAGNOSIS — W540XXA Bitten by dog, initial encounter: Secondary | ICD-10-CM | POA: Diagnosis not present

## 2016-12-12 DIAGNOSIS — Z951 Presence of aortocoronary bypass graft: Secondary | ICD-10-CM | POA: Diagnosis not present

## 2016-12-12 DIAGNOSIS — I10 Essential (primary) hypertension: Secondary | ICD-10-CM | POA: Diagnosis not present

## 2016-12-12 DIAGNOSIS — Z880 Allergy status to penicillin: Secondary | ICD-10-CM | POA: Diagnosis not present

## 2016-12-12 DIAGNOSIS — W540XXA Bitten by dog, initial encounter: Secondary | ICD-10-CM | POA: Diagnosis not present

## 2016-12-12 DIAGNOSIS — L719 Rosacea, unspecified: Secondary | ICD-10-CM | POA: Diagnosis not present

## 2016-12-12 DIAGNOSIS — Z87891 Personal history of nicotine dependence: Secondary | ICD-10-CM | POA: Diagnosis not present

## 2016-12-12 DIAGNOSIS — S91051A Open bite, right ankle, initial encounter: Secondary | ICD-10-CM | POA: Diagnosis not present

## 2016-12-12 DIAGNOSIS — Z23 Encounter for immunization: Secondary | ICD-10-CM | POA: Diagnosis not present

## 2016-12-12 DIAGNOSIS — I2510 Atherosclerotic heart disease of native coronary artery without angina pectoris: Secondary | ICD-10-CM | POA: Diagnosis not present

## 2016-12-12 DIAGNOSIS — Z203 Contact with and (suspected) exposure to rabies: Secondary | ICD-10-CM | POA: Diagnosis not present

## 2016-12-15 DIAGNOSIS — Z23 Encounter for immunization: Secondary | ICD-10-CM | POA: Diagnosis not present

## 2016-12-15 DIAGNOSIS — Z203 Contact with and (suspected) exposure to rabies: Secondary | ICD-10-CM | POA: Diagnosis not present

## 2016-12-19 DIAGNOSIS — Z23 Encounter for immunization: Secondary | ICD-10-CM | POA: Diagnosis not present

## 2016-12-19 DIAGNOSIS — Z203 Contact with and (suspected) exposure to rabies: Secondary | ICD-10-CM | POA: Diagnosis not present

## 2016-12-26 ENCOUNTER — Telehealth: Payer: Self-pay | Admitting: Cardiovascular Disease

## 2016-12-26 DIAGNOSIS — Z23 Encounter for immunization: Secondary | ICD-10-CM | POA: Diagnosis not present

## 2016-12-26 DIAGNOSIS — Z203 Contact with and (suspected) exposure to rabies: Secondary | ICD-10-CM | POA: Diagnosis not present

## 2016-12-26 NOTE — Telephone Encounter (Signed)
New message       Pt c/o BP issue: STAT if pt c/o blurred vision, one-sided weakness or slurred speech  1. What are your last 5 BP readings?  207/92 3wks ago; 170/? Last week; 187/90 today  2. Are you having any other symptoms (ex. Dizziness, headache, blurred vision, passed out)?  nausea 3. What is your BP issue?  Pt is concerned that her bp is high.  She was bitten by a dog approx 3 weeks ago and is having to take rabies shots.  Not sure if this is causing her bp to be high or if it is the stress of being bitten.  However, pt is concerned and want to see what Dr Clifton James thinks.  Please call

## 2016-12-26 NOTE — Telephone Encounter (Signed)
Agree. Thanks

## 2016-12-26 NOTE — Telephone Encounter (Signed)
I spoke with pt. Reading of 207/92 was when she was initially evaluated for dog bite.  She has been receiving rabies injections. Reading of 187/90 was today when she was receiving last injection. She checked it once at home recently and BP was 165/80.  She does have some nausea but no vomiting. This is not new.  Has been feeling light headed for a few weeks and thinks it may be related to allergies.  I asked her to check BP daily for the next 7-10 days and call us back with these readings.

## 2017-01-02 DIAGNOSIS — R21 Rash and other nonspecific skin eruption: Secondary | ICD-10-CM | POA: Diagnosis not present

## 2017-01-08 NOTE — Telephone Encounter (Signed)
I placed call to pt to get BP readings. Left message to call office.

## 2017-01-25 NOTE — Telephone Encounter (Signed)
I spoke with pt. She reports blood pressure is much better and she is doing fine

## 2017-01-30 ENCOUNTER — Ambulatory Visit (INDEPENDENT_AMBULATORY_CARE_PROVIDER_SITE_OTHER): Payer: Medicare HMO | Admitting: Ophthalmology

## 2017-01-30 DIAGNOSIS — H35033 Hypertensive retinopathy, bilateral: Secondary | ICD-10-CM | POA: Diagnosis not present

## 2017-01-30 DIAGNOSIS — H4313 Vitreous hemorrhage, bilateral: Secondary | ICD-10-CM | POA: Diagnosis not present

## 2017-01-30 DIAGNOSIS — I10 Essential (primary) hypertension: Secondary | ICD-10-CM

## 2017-01-30 DIAGNOSIS — H353132 Nonexudative age-related macular degeneration, bilateral, intermediate dry stage: Secondary | ICD-10-CM

## 2017-02-02 DIAGNOSIS — S30860A Insect bite (nonvenomous) of lower back and pelvis, initial encounter: Secondary | ICD-10-CM | POA: Diagnosis not present

## 2017-03-27 DIAGNOSIS — R12 Heartburn: Secondary | ICD-10-CM | POA: Diagnosis not present

## 2017-03-27 DIAGNOSIS — Z800 Family history of malignant neoplasm of digestive organs: Secondary | ICD-10-CM | POA: Diagnosis not present

## 2017-04-02 DIAGNOSIS — K319 Disease of stomach and duodenum, unspecified: Secondary | ICD-10-CM | POA: Diagnosis not present

## 2017-04-02 DIAGNOSIS — K3189 Other diseases of stomach and duodenum: Secondary | ICD-10-CM | POA: Diagnosis not present

## 2017-04-02 DIAGNOSIS — K5730 Diverticulosis of large intestine without perforation or abscess without bleeding: Secondary | ICD-10-CM | POA: Diagnosis not present

## 2017-04-02 DIAGNOSIS — R12 Heartburn: Secondary | ICD-10-CM | POA: Diagnosis not present

## 2017-04-02 DIAGNOSIS — K317 Polyp of stomach and duodenum: Secondary | ICD-10-CM | POA: Diagnosis not present

## 2017-04-02 DIAGNOSIS — Z800 Family history of malignant neoplasm of digestive organs: Secondary | ICD-10-CM | POA: Diagnosis not present

## 2017-04-09 DIAGNOSIS — K319 Disease of stomach and duodenum, unspecified: Secondary | ICD-10-CM | POA: Diagnosis not present

## 2017-05-17 ENCOUNTER — Encounter: Payer: Self-pay | Admitting: Cardiovascular Disease

## 2017-07-24 DIAGNOSIS — H5211 Myopia, right eye: Secondary | ICD-10-CM | POA: Diagnosis not present

## 2017-07-24 DIAGNOSIS — H11153 Pinguecula, bilateral: Secondary | ICD-10-CM | POA: Diagnosis not present

## 2017-07-24 DIAGNOSIS — H18413 Arcus senilis, bilateral: Secondary | ICD-10-CM | POA: Diagnosis not present

## 2017-07-24 DIAGNOSIS — Z961 Presence of intraocular lens: Secondary | ICD-10-CM | POA: Diagnosis not present

## 2017-07-24 DIAGNOSIS — H353131 Nonexudative age-related macular degeneration, bilateral, early dry stage: Secondary | ICD-10-CM | POA: Diagnosis not present

## 2017-07-24 DIAGNOSIS — H35033 Hypertensive retinopathy, bilateral: Secondary | ICD-10-CM | POA: Diagnosis not present

## 2017-07-24 DIAGNOSIS — H16223 Keratoconjunctivitis sicca, not specified as Sjogren's, bilateral: Secondary | ICD-10-CM | POA: Diagnosis not present

## 2017-07-24 DIAGNOSIS — Z9849 Cataract extraction status, unspecified eye: Secondary | ICD-10-CM | POA: Diagnosis not present

## 2017-07-24 DIAGNOSIS — H11423 Conjunctival edema, bilateral: Secondary | ICD-10-CM | POA: Diagnosis not present

## 2017-07-24 DIAGNOSIS — H04123 Dry eye syndrome of bilateral lacrimal glands: Secondary | ICD-10-CM | POA: Diagnosis not present

## 2017-07-25 ENCOUNTER — Other Ambulatory Visit: Payer: Self-pay | Admitting: *Deleted

## 2017-07-25 ENCOUNTER — Telehealth: Payer: Self-pay | Admitting: Cardiovascular Disease

## 2017-07-25 DIAGNOSIS — I10 Essential (primary) hypertension: Secondary | ICD-10-CM

## 2017-07-25 DIAGNOSIS — I25810 Atherosclerosis of coronary artery bypass graft(s) without angina pectoris: Secondary | ICD-10-CM

## 2017-07-25 MED ORDER — LISINOPRIL 40 MG PO TABS: 40.0000 mg | ORAL_TABLET | Freq: Every day | ORAL | 0 refills | Status: DC

## 2017-07-25 MED ORDER — NITROGLYCERIN 0.4 MG SL SUBL: 0.4000 mg | SUBLINGUAL_TABLET | SUBLINGUAL | 0 refills | Status: DC | PRN

## 2017-07-25 MED ORDER — METOPROLOL TARTRATE 50 MG PO TABS
ORAL_TABLET | ORAL | 0 refills | Status: DC
Start: 2017-07-25 — End: 2017-07-30

## 2017-07-25 NOTE — Telephone Encounter (Signed)
°*  STAT* If patient is at the pharmacy, call can be transferred to refill team.   1. Which medications need to be refilled? (please list name of each medication and dose if known) Lisinopril,Nitroglycerin and Metoprolol 2. Which pharmacy/location (including street and city if local pharmacy) is medication to be sent to?[facility]  3. Do they need a 30 day or 90 day supply? Metoprolol#270 and the other 90 and refillst

## 2017-07-30 ENCOUNTER — Encounter: Payer: Self-pay | Admitting: Physician Assistant

## 2017-07-30 ENCOUNTER — Encounter (INDEPENDENT_AMBULATORY_CARE_PROVIDER_SITE_OTHER): Payer: Self-pay

## 2017-07-30 ENCOUNTER — Ambulatory Visit: Payer: Medicare HMO | Admitting: Physician Assistant

## 2017-07-30 VITALS — BP 128/84 | HR 65 | Ht 62.0 in | Wt 213.0 lb

## 2017-07-30 DIAGNOSIS — I10 Essential (primary) hypertension: Secondary | ICD-10-CM

## 2017-07-30 DIAGNOSIS — I2510 Atherosclerotic heart disease of native coronary artery without angina pectoris: Secondary | ICD-10-CM | POA: Diagnosis not present

## 2017-07-30 DIAGNOSIS — E7800 Pure hypercholesterolemia, unspecified: Secondary | ICD-10-CM | POA: Diagnosis not present

## 2017-07-30 DIAGNOSIS — R011 Cardiac murmur, unspecified: Secondary | ICD-10-CM

## 2017-07-30 DIAGNOSIS — I5032 Chronic diastolic (congestive) heart failure: Secondary | ICD-10-CM | POA: Diagnosis not present

## 2017-07-30 MED ORDER — METOPROLOL TARTRATE 50 MG PO TABS: 100.0000 mg | ORAL_TABLET | Freq: Two times a day (BID) | ORAL | 3 refills | Status: DC

## 2017-07-30 MED ORDER — LISINOPRIL 40 MG PO TABS: 40.0000 mg | ORAL_TABLET | Freq: Every day | ORAL | 0 refills | Status: DC

## 2017-07-30 NOTE — Patient Instructions (Addendum)
Medication Instructions:  Your physician has recommended you make the following change in your medication:  1.  INCREASE the Metoprolol to 2 tablets twice a day    Labwork: SOON:  FASTING LIPID  Testing/Procedures: None ordered  Follow-Up: Your physician recommends that you schedule a follow-up appointment in: AFTER FASTING LIPID WITH LIPID CLINIC FOR PSCK9 INHIBITOR  Your physician wants you to follow-up in: 1 YEAR WITH DR. Clifton JamesMCALHANY You will receive a reminder letter in the mail two months in advance. If you don't receive a letter, please call our office to schedule the follow-up appointment.  Any Other Special Instructions Will Be Listed Below (If Applicable).   Low-Sodium Eating Plan Sodium, which is an element that makes up salt, helps you maintain a healthy balance of fluids in your body. Too much sodium can increase your blood pressure and cause fluid and waste to be held in your body. Your health care provider or dietitian may recommend following this plan if you have high blood pressure (hypertension), kidney disease, liver disease, or heart failure. Eating less sodium can help lower your blood pressure, reduce swelling, and protect your heart, liver, and kidneys. What are tips for following this plan? General guidelines  Most people on this plan should limit their sodium intake to 1,500-2,000 mg (milligrams) of sodium each day. Reading food labels  The Nutrition Facts label lists the amount of sodium in one serving of the food. If you eat more than one serving, you must multiply the listed amount of sodium by the number of servings.  Choose foods with less than 140 mg of sodium per serving.  Avoid foods with 300 mg of sodium or more per serving. Shopping  Look for lower-sodium products, often labeled as "low-sodium" or "no salt added."  Always check the sodium content even if foods are labeled as "unsalted" or "no salt added".  Buy fresh foods. ? Avoid canned foods  and premade or frozen meals. ? Avoid canned, cured, or processed meats  Buy breads that have less than 80 mg of sodium per slice. Cooking  Eat more home-cooked food and less restaurant, buffet, and fast food.  Avoid adding salt when cooking. Use salt-free seasonings or herbs instead of table salt or sea salt. Check with your health care provider or pharmacist before using salt substitutes.  Cook with plant-based oils, such as canola, sunflower, or olive oil. Meal planning  When eating at a restaurant, ask that your food be prepared with less salt or no salt, if possible.  Avoid foods that contain MSG (monosodium glutamate). MSG is sometimes added to [place] food, bouillon, and some canned foods. What foods are recommended? The items listed may not be a complete list. Talk with your dietitian about what dietary choices are best for you. Grains Low-sodium cereals, including oats, puffed wheat and rice, and shredded wheat. Low-sodium crackers. Unsalted rice. Unsalted pasta. Low-sodium bread. Whole-grain breads and whole-grain pasta. Vegetables Fresh or frozen vegetables. "No salt added" canned vegetables. "No salt added" tomato sauce and paste. Low-sodium or reduced-sodium tomato and vegetable juice. Fruits Fresh, frozen, or canned fruit. Fruit juice. Meats and other protein foods Fresh or frozen (no salt added) meat, poultry, seafood, and fish. Low-sodium canned tuna and salmon. Unsalted nuts. Dried peas, beans, and lentils without added salt. Unsalted canned beans. Eggs. Unsalted nut butters. Dairy Milk. Soy milk. Cheese that is naturally low in sodium, such as ricotta cheese, fresh mozzarella, or Swiss cheese Low-sodium or reduced-sodium cheese. Cream cheese. Yogurt. Fats  and oils Unsalted butter. Unsalted margarine with no trans fat. Vegetable oils such as canola or olive oils. Seasonings and other foods Fresh and dried herbs and spices. Salt-free seasonings. Low-sodium mustard and  ketchup. Sodium-free salad dressing. Sodium-free light mayonnaise. Fresh or refrigerated horseradish. Lemon juice. Vinegar. Homemade, reduced-sodium, or low-sodium soups. Unsalted popcorn and pretzels. Low-salt or salt-free chips. What foods are not recommended? The items listed may not be a complete list. Talk with your dietitian about what dietary choices are best for you. Grains Instant hot cereals. Bread stuffing, pancake, and biscuit mixes. Croutons. Seasoned rice or pasta mixes. Noodle soup cups. Boxed or frozen macaroni and cheese. Regular salted crackers. Self-rising flour. Vegetables Sauerkraut, pickled vegetables, and relishes. Olives. [place] fries. Onion rings. Regular canned vegetables (not low-sodium or reduced-sodium). Regular canned tomato sauce and paste (not low-sodium or reduced-sodium). Regular tomato and vegetable juice (not low-sodium or reduced-sodium). Frozen vegetables in sauces. Meats and other protein foods Meat or fish that is salted, canned, smoked, spiced, or pickled. Bacon, ham, sausage, hotdogs, corned beef, chipped beef, packaged lunch meats, salt pork, jerky, pickled herring, anchovies, regular canned tuna, sardines, salted nuts. Dairy Processed cheese and cheese spreads. Cheese curds. Blue cheese. Feta cheese. String cheese. Regular cottage cheese. Buttermilk. Canned milk. Fats and oils Salted butter. Regular margarine. Ghee. Bacon fat. Seasonings and other foods Onion salt, garlic salt, seasoned salt, table salt, and sea salt. Canned and packaged gravies. Worcestershire sauce. Tartar sauce. Barbecue sauce. Teriyaki sauce. Soy sauce, including reduced-sodium. Steak sauce. Fish sauce. Oyster sauce. Cocktail sauce. Horseradish that you find on the shelf. Regular ketchup and mustard. Meat flavorings and tenderizers. Bouillon cubes. Hot sauce and Tabasco sauce. Premade or packaged marinades. Premade or packaged taco seasonings. Relishes. Regular salad dressings. Salsa.  Potato and tortilla chips. Corn chips and puffs. Salted popcorn and pretzels. Canned or dried soups. Pizza. Frozen entrees and pot pies. Summary  Eating less sodium can help lower your blood pressure, reduce swelling, and protect your heart, liver, and kidneys.  Most people on this plan should limit their sodium intake to 1,500-2,000 mg (milligrams) of sodium each day.  Canned, boxed, and frozen foods are high in sodium. Restaurant foods, fast foods, and pizza are also very high in sodium. You also get sodium by adding salt to food.  Try to cook at home, eat more fresh fruits and vegetables, and eat less fast food, canned, processed, or prepared foods. This information is not intended to replace advice given to you by your health care provider. Make sure you discuss any questions you have with your health care provider. Document Released: 02/17/2002 Document Revised: 08/21/2016 Document Reviewed: 08/21/2016 Elsevier Interactive Patient Education  2017 [employer]  If you need a refill on your cardiac medications before your next appointment, please call your pharmacy.

## 2017-07-30 NOTE — Progress Notes (Signed)
Cardiology Office Note    Date:  07/30/2017   ID:  Samantha Short, DOB 04-Oct-1945, MRN [id number]  PCP:  Mila Palmer, MD  Cardiologist: Dr. Clifton James  Chief Complaint  Patient presents with  . Follow-up    History of Present Illness:  Samantha Short is a 71 y.o. female with history of CAD status post CABG x1 in 2010, cardiac cath 2013 70-80% LAD, 30% mid RCA, LIMA to the LAD patent with competitive flow limiting distal LAD filling, ejection fraction 70% with hyperdynamic LV function.  Medical therapy recommended.  She has not tolerated Norvasc in the past.  2D echo in 2017 normal LV function, grade 2 DD, Mild AS and Mild RM.  Intolerant to statins.  Has hypertension, chronic diastolic CHF HLD, mild MR, GERD.  Last saw Dr. Clifton James 06/2016 and was doing well.  She refuses to take statins due to muscle aches, she stopped Zetia due to cost and he discussed PCSK9 inhibitors but she did not wish to consider.  Patient comes in today for follow-up.  She complains of headaches about 45 minutes after she takes her blood pressure medications and her blood pressure is running high.  She actually stopped taking her blood pressure because she waits for her medicine to kick in.  She takes her lisinopril at night because she says it helps her sleep.  She also has gained some weight because the pool she swims that is under going repair and she has not been able to swim for the past 4 months.  She usually swims 3 days a week.  It should open in the next couple weeks.  She denies any chest pain, palpitations, dizziness or presyncope.  She has chronic dyspnea on exertion that she attributes to asthma.  She retired from working 4 months ago.  She says her son died of a massive heart attack at age 32.  Past Medical History:  Diagnosis Date  . Asthma   . Chronic diastolic CHF (congestive heart failure) (HCC)    a. 02/2011 Echo: EF 55-60%, Gr2 DD, Mild MR  . Coronary artery disease    a. s/p CABG x 1  2010:  LIMA->LAD.;  b. amdx for CP => LHC 07/04/12: LAD 70-80%, mid RCA 30%, LIMA-LAD patent with competitive flow limiting distal LAD filling, EF 70% with hyperdynamic LV function. Medical therapy continued.  . Dyslipidemia   . GERD (gastroesophageal reflux disease)   . Hyperlipidemia   . Hypertension   . Mitral regurgitation    a. mild by echo 02/2011.    Past Surgical History:  Procedure Laterality Date  . CARDIAC CATHETERIZATION  07/27/09 & 07/28/09  . CESAREAN SECTION    . CESAREAN SECTION    . CORONARY ARTERY BYPASS GRAFT  08/03/2009   x1 using left internal mammary artery to distal left anterior  descending coronary artery.   Marland Kitchen GALLBLADDER SURGERY  2001  . LEFT HEART CATHETERIZATION WITH CORONARY/GRAFT ANGIOGRAM N/A 07/05/2012   Performed by Wendall Stade, MD at [facility] CATH LAB  . SHOULDER SURGERY  1998 / 2001   from accident  . VESICOVAGINAL FISTULA CLOSURE W/ TAH  1998    Current Medications: Current Meds  Medication Sig  . albuterol (PROVENTIL HFA;VENTOLIN HFA) 108 (90 BASE) MCG/ACT inhaler Inhale 1-2 puffs into the lungs every 6 (six) hours as needed for wheezing or shortness of breath.  Marland Kitchen aspirin 81 MG tablet Take 81 mg by mouth daily.  . furosemide (LASIX) 20 MG tablet Take  1 tablet (20 mg total) by mouth daily as needed.  Marland Kitchen. lisinopril (PRINIVIL,ZESTRIL) 40 MG tablet Take 1 tablet (40 mg total) daily by mouth.  . Multiple Vitamin (MULTIVITAMIN WITH MINERALS) TABS tablet Take 1 tablet by mouth daily.  . nitroGLYCERIN (NITROSTAT) 0.4 MG SL tablet Place 1 tablet (0.4 mg total) every 5 (five) minutes as needed under the tongue for chest pain.  . [DISCONTINUED] calcium carbonate (OS-CAL) 600 MG TABS tablet Take 600 mg by mouth 2 (two) times daily with a meal.  . [DISCONTINUED] metoprolol tartrate (LOPRESSOR) 50 MG tablet TAKE 1 & 1/2 TABLETS BY MOUTH 2 TIMES DAILY     Allergies:   Ceclor [cefaclor]; Hctz [hydrochlorothiazide]; Lipitor [atorvastatin calcium]; Norvasc  [amlodipine]; Penicillins; Sulfa antibiotics; Statins; Erythromycin; Latex; Levofloxacin; and Tetracyclines & related   Social History   Socioeconomic History  . Marital status: Married    Spouse name: None  . Number of children: None  . Years of education: None  . Highest education level: None  Social Needs  . Financial resource strain: None  . Food insecurity - worry: None  . Food insecurity - inability: None  . Transportation needs - medical: None  . Transportation needs - non-medical: None  Occupational History  . None  Tobacco Use  . Smoking status: Former Smoker    Years: 7.00    Types: Cigarettes    Last attempt to quit: 07/12/2010    Years since quitting: 7.0  . Smokeless tobacco: Never Used  Substance and Sexual Activity  . Alcohol use: No  . Drug use: No  . Sexual activity: None  Other Topics Concern  . None  Social History Narrative  . None     Family History:  The patient's family history includes Angina in her sister; Cancer in her brother, father, and sister; Coronary artery disease in her son; Heart attack in her father and mother; Heart disease in her brother, mother, and sister; Hypertension in her brother and sister.   ROS:   Please see the history of present illness.    Review of Systems  Constitution: Negative.  HENT: Negative.   Eyes: Negative.   Cardiovascular: Positive for dyspnea on exertion.  Respiratory: Negative.   Hematologic/Lymphatic: Negative.   Musculoskeletal: Negative.  Negative for joint pain.  Gastrointestinal: Negative.   Genitourinary: Negative.   Neurological: Negative.    All other systems reviewed and are negative.   PHYSICAL EXAM:   VS:  BP 128/84   Pulse 65   Ht 5\' 2"  (1.575 m)   Wt 213 lb (96.6 kg)   BMI 38.96 kg/m   Physical Exam  GEN: Obese, in no acute distress  Neck: no JVD, carotid bruits, or masses Cardiac:RRR; 2/6 systolic murmur at the left sternal border respiratory:  clear to auscultation bilaterally,  normal work of breathing GI: soft, nontender, nondistended, + BS Ext: Mild ankle edema bilaterally without cyanosis, clubbing,  Good distal pulses bilaterally Neuro:  Alert and Oriented x 3 Psych: euthymic mood, full affect  Wt Readings from Last 3 Encounters:  07/30/17 213 lb (96.6 kg)  06/14/16 212 lb 1.9 oz (96.2 kg)  01/22/15 208 lb 12.8 oz (94.7 kg)      Studies/Labs Reviewed:   EKG:  EKG is  ordered today.  The ekg ordered today demonstrates normal sinus rhythm with incomplete right bundle branch block  Recent Labs: No results found for requested labs within last 8760 hours.   Lipid Panel    Component Value Date/Time  CHOL 190 06/28/2016 0930   TRIG 74 06/28/2016 0930   HDL 58 06/28/2016 0930   CHOLHDL 3.3 06/28/2016 0930   VLDL 15 06/28/2016 0930   LDLCALC 117 06/28/2016 0930    Additional studies/ records that were reviewed today include:   Echo 10/28/13: Left ventricle: The cavity size was normal. Wall thickness was normal. Systolic function was normal. The estimated ejection fraction was in the range of 55% to 60%. Wall motion was normal; there were no regional wall motion abnormalities. Doppler parameters are consistent with abnormal left ventricular relaxation (grade 1 diastolic dysfunction). - Aortic valve: There was no stenosis. - Mitral valve: Moderately calcified annulus. Mildly calcified leaflets . Trivial regurgitation. - Left atrium: The atrium was mildly dilated. - Tricuspid valve: Peak RV-RA gradient: 27mm Hg (S). - Pulmonary arteries: PA peak pressure: 30mm Hg (S). - Inferior vena cava: The vessel was normal in size; the respirophasic diameter changes were in the normal range (= 50%); findings are consistent with normal central venous pressure. Impressions: - Normal LV size and systolic function, EF 55-60%. Normal RV size and systolic function. No significant valvular Abnormalities.   2D echo 10/18/2017Study Conclusions   - Left  ventricle: The cavity size was normal. There was mild focal   basal hypertrophy of the septum. Systolic function was normal.   The estimated ejection fraction was in the range of 55% to 60%.   Wall motion was normal; there were no regional wall motion   abnormalities. Features are consistent with a pseudonormal left   ventricular filling pattern, with concomitant abnormal relaxation   and increased filling pressure (grade 2 diastolic dysfunction). - Aortic valve: There was very mild stenosis. There was trivial   regurgitation. - Mitral valve: Calcified annulus. There was mild regurgitation. - Left atrium: The atrium was mildly dilated. - Pulmonary arteries: Systolic pressure was mildly increased. PA   peak pressure: 41 mm Hg (S).   Impressions:   - Normal LV systolic function; grade 2 diastolic dysfunction;   calcified aortic valve with very mild AS (peak velocity 2.2 ms   and mean gradient 10 mmHg); trace AI; mild MR; mild LAE; mild TR   with mildly elevated pulmonary pressure.      ASSESSMENT:    1. Coronary artery disease involving native coronary artery of native heart without angina pectoris   2. Essential hypertension   3. Chronic diastolic CHF (congestive heart failure) (HCC)   4. Pure hypercholesterolemia   5. Heart murmur      PLAN:  In order of problems listed above:  CAD status post CABG times 1, 2010 In 2013 patent LIMA to the LAD with competitive flow limiting distal LAD filling EF 70% with hyperdynamic LV function.  Medical therapy recommended.  No angina and doing well.  Essential hypertension blood pressure has been running high at home especially in the morning.  She is intolerant to many medications including amlodipine.  We will increase metoprolol to 50 mg twice daily.  She is to call if it continues to stay high.  2 g sodium diet recommended.  Swimming and exercise program will help.  Chronic diastolic CHF with grade 2 DD on echo last year.  Mild ankle  edema on exam.  2 g sodium diet.  Continue low-dose Lasix.  Hypercholesterolemia refuses statins and now willing to consider a PCSK9 inhibitor-Will check fasting lipid panel on refer to the lipid clinic.  Mild AS and mild MR on echo last year.  We will not repeat  this year.  Follow-up with Dr. Clifton JamesMcAlhany in 1 year.  Medication Adjustments/Labs and Tests Ordered: Current medicines are reviewed at length with the patient today.  Concerns regarding medicines are outlined above.  Medication changes, Labs and Tests ordered today are listed in the Patient Instructions below. Patient Instructions  Medication Instructions:  Your physician has recommended you make the following change in your medication:  1.  INCREASE the Metoprolol to 2 tablets twice a day    Labwork: SOON:  FASTING LIPID  Testing/Procedures: None ordered  Follow-Up: Your physician recommends that you schedule a follow-up appointment in: AFTER FASTING LIPID WITH LIPID CLINIC FOR PSCK9 INHIBITOR  Your physician wants you to follow-up in: 1 YEAR WITH DR. Clifton JamesMCALHANY You will receive a reminder letter in the mail two months in advance. If you don't receive a letter, please call our office to schedule the follow-up appointment.  Any Other Special Instructions Will Be Listed Below (If Applicable).   If you need a refill on your cardiac medications before your next appointment, please call your pharmacy.      Signed, Jacolyn ReedyMichele Lenze, PA-C  07/30/2017 11:14 AM    [facility] [address], [place], [place]  [address] Phone: [phone]; Fax: [phone]

## 2017-07-31 ENCOUNTER — Other Ambulatory Visit: Payer: Self-pay | Admitting: *Deleted

## 2017-07-31 ENCOUNTER — Other Ambulatory Visit: Payer: Medicare HMO

## 2017-07-31 MED ORDER — METOPROLOL TARTRATE 50 MG PO TABS: 100.0000 mg | ORAL_TABLET | Freq: Two times a day (BID) | ORAL | 0 refills | Status: DC

## 2017-07-31 NOTE — Telephone Encounter (Signed)
Short term rx per patients request until her mail order arrives.

## 2017-08-07 ENCOUNTER — Other Ambulatory Visit: Payer: Medicare HMO

## 2017-08-07 DIAGNOSIS — E7800 Pure hypercholesterolemia, unspecified: Secondary | ICD-10-CM | POA: Diagnosis not present

## 2017-08-07 DIAGNOSIS — I2510 Atherosclerotic heart disease of native coronary artery without angina pectoris: Secondary | ICD-10-CM | POA: Diagnosis not present

## 2017-08-07 DIAGNOSIS — R011 Cardiac murmur, unspecified: Secondary | ICD-10-CM | POA: Diagnosis not present

## 2017-08-07 DIAGNOSIS — I5032 Chronic diastolic (congestive) heart failure: Secondary | ICD-10-CM | POA: Diagnosis not present

## 2017-08-07 DIAGNOSIS — I10 Essential (primary) hypertension: Secondary | ICD-10-CM | POA: Diagnosis not present

## 2017-08-07 LAB — LIPID PANEL
CHOL/HDL RATIO: 3.1 ratio (ref 0.0–4.4)
CHOLESTEROL TOTAL: 194 mg/dL (ref 100–199)
HDL: 63 mg/dL (ref >39)
LDL Calculated: 115 mg/dL — ABNORMAL HIGH (ref 0–99)
TRIGLYCERIDES: 81 mg/dL (ref 0–149)
VLDL Cholesterol Cal: 16 mg/dL (ref 5–40)

## 2017-08-09 NOTE — Progress Notes (Signed)
Patient ID: Samantha Baasatsy R Heider                 DOB: [birth year]                    MRN: [id number]     HPI: Samantha Short is a 71 y.o. female patient of Dr. Clifton JamesMcAlhany referred to lipid clinic by Jacolyn ReedyMichele Lenze, PA. PMH is significant for CAD s/p CABG x1 in 2010, cardiac cath 2013 70-80% LAD, 30% mid-RCA, HTN, chronic diastolic CHF (EF 40%70%), HLD, mild MR, GERD. Intolerant to statins. Refuses to take statins due to muscle pain, stopped Zetia due to cost, and did not wish to consider PCSK9i at last OV with Dr. Clifton JamesMcAlhany in 06/2016. At last OV with Jacolyn ReedyMichele Lenze, PA, patient was willing to consider PCSK9i.  Patient arrives to clinic in good spirits today to discuss PCSK9i therapy. Patient reports severe stiffness with most statins she tried previously and with Zetia, and muscle pain with Crestor. She states that she bruises very easily and is concerned about bruising more from the injections.   She reports only taking metoprolol 75mg  BID rather than prescribed dose of 100mg  BID at last office visit. Lasix, metoprolol, and lisinopril refills were supposed to be sent in at this visit however they were not. Rx's have been refilled. Reinforced importance of decreasing caffeine intake due to elevated BP.   Current Medications: None Intolerances: Lipitor 40mg  daily - stiffness that started within 3-4 days of taking (could barely get out of bed), pravastatin 10mg  every other day and 20mg  daily - stiffness within 3-4 days of starting, Crestor 5mg  every other day (muscle pain), Zetia 10mg  daily - stiffness Risk Factors: CAD s/p CABG, HTN, strong family history LDL goal: < 70 mg/dL  Diet:  Breakfast: peanut butter on white or wheat toast with a cup of black coffee Lunch: sandwich and chips Dinner: grilled pork chops with baked potato and corn Drinks: 6 cups of coffee throughout the day, water throughout the day, ginger ale, diet coke, sweet tea, green tea  Exercise: Used to swim laps 3 days per week but the pool  she swims in is undergoing repair, so she hasn't been able to swim for the past 4 months. She thinks it should re-open in the next couple of weeks. Walks her dog ([place]) every day.   Family History: CAD and MI (age 71) in son. MI in (age 71) mother and (age 71) father. Triple bypass (in his 1060s, now in his 9380s) in brother. HTN in brother and sister. Heart disease in brother, mother, and sister. Angina and stroke in the eye in sister. Cancer in brother, father, and sister.    Social History: Former smoker x 7 years, quit in 2011. Denies alcohol or illicit drug use. Retired from working 4 months ago.  Last Lipid Panel: 08/07/17: TG 194, TG 81, HDL 63, LDL 115, Chol/HDL ratio 3.1 (no lipid lowering therapy)  Past Medical History:  Diagnosis Date  . Asthma   . Chronic diastolic CHF (congestive heart failure) (HCC)    a. 02/2011 Echo: EF 55-60%, Gr2 DD, Mild MR  . Coronary artery disease    a. s/p CABG x 1 2010:  LIMA->LAD.;  b. amdx for CP => LHC 07/04/12: LAD 70-80%, mid RCA 30%, LIMA-LAD patent with competitive flow limiting distal LAD filling, EF 70% with hyperdynamic LV function. Medical therapy continued.  . Dyslipidemia   . GERD (gastroesophageal reflux disease)   . Hyperlipidemia   .  Hypertension   . Mitral regurgitation    a. mild by echo 02/2011.    Current Outpatient Medications on File Prior to Visit  Medication Sig Dispense Refill  . albuterol (PROVENTIL HFA;VENTOLIN HFA) 108 (90 BASE) MCG/ACT inhaler Inhale 1-2 puffs into the lungs every 6 (six) hours as needed for wheezing or shortness of breath.    Marland Kitchen. aspirin 81 MG tablet Take 81 mg by mouth daily.    . furosemide (LASIX) 20 MG tablet Take 1 tablet (20 mg total) by mouth daily as needed. 45 tablet 3  . lisinopril (PRINIVIL,ZESTRIL) 40 MG tablet Take 1 tablet (40 mg total) daily by mouth. 90 tablet 0  . lisinopril (PRINIVIL,ZESTRIL) 40 MG tablet Take 1 tablet (40 mg total) daily by mouth. 7 tablet 0  . metoprolol  tartrate (LOPRESSOR) 50 MG tablet Take 2 tablets (100 mg total) 2 (two) times daily by mouth. 20 tablet 0  . Multiple Vitamin (MULTIVITAMIN WITH MINERALS) TABS tablet Take 1 tablet by mouth daily.    . nitroGLYCERIN (NITROSTAT) 0.4 MG SL tablet Place 1 tablet (0.4 mg total) every 5 (five) minutes as needed under the tongue for chest pain. 25 tablet 0   No current facility-administered medications on file prior to visit.     Allergies  Allergen Reactions  . Ceclor [Cefaclor] Other (See Comments)    Lost vision in eye  . Hctz [Hydrochlorothiazide] Other (See Comments)    Renal insufficiency  . Lipitor [Atorvastatin Calcium] Other (See Comments)    Increased A1C  . Norvasc [Amlodipine] Other (See Comments)    Makes patient stiff  . Penicillins Hives, Shortness Of Breath, Itching and Rash  . Sulfa Antibiotics Other (See Comments)    CAUSES SHOCK  . Statins Other (See Comments)    MYALGIAS AND WEAKNESS  . Erythromycin Rash  . Latex Rash  . Levofloxacin Rash  . Tetracyclines & Related Rash    Assessment/Plan:  1. Hyperlipidemia - Recent lipid panel indicated LDL of 115, which is above patient's goal of < 70 mg/dL due to history of ASCVD. As patient is intolerant to multiple low dose statins a few times per week, would not recommend statin retrial. Patient was also intolerant to Zetia and this alone would not bring her to goal. Discussed mechanism, storage and instructions for use, adverse effects, and cost barriers with PCSK9i therapy, however pt does not want to pursue injectable cholesterol medication due to easy brusing. Discussed CLEAR clinical trial and patient is interested in learning more about clinical trial at this time. Forwarded information to research nurses and informed patient to contact us if they have not contacted her in the next 2 weeks.   Patient seen with Josiah LoboBrittany Ziad Maye, PharmD Candidate

## 2017-08-10 ENCOUNTER — Telehealth: Payer: Self-pay | Admitting: *Deleted

## 2017-08-10 ENCOUNTER — Ambulatory Visit (INDEPENDENT_AMBULATORY_CARE_PROVIDER_SITE_OTHER): Payer: Medicare HMO | Admitting: Pharmacist

## 2017-08-10 VITALS — BP 140/86 | HR 60

## 2017-08-10 DIAGNOSIS — E782 Mixed hyperlipidemia: Secondary | ICD-10-CM | POA: Diagnosis not present

## 2017-08-10 MED ORDER — METOPROLOL TARTRATE 50 MG PO TABS: 75.0000 mg | ORAL_TABLET | Freq: Two times a day (BID) | ORAL | 3 refills | Status: DC

## 2017-08-10 MED ORDER — LISINOPRIL 40 MG PO TABS: 40.0000 mg | ORAL_TABLET | Freq: Every day | ORAL | 3 refills | Status: DC

## 2017-08-10 MED ORDER — FUROSEMIDE 20 MG PO TABS: 20.0000 mg | ORAL_TABLET | Freq: Every day | ORAL | 3 refills | Status: DC | PRN

## 2017-08-10 NOTE — Telephone Encounter (Signed)
Spoke with subject regarding participation in the [employer] Research Study.  Answered all questions that arose and gave her the website: www.clinicaltrials.gov for further research.  She also wants to discuss the trial with her daughter.  She gave me permission to call her back in a week to check her response.

## 2017-08-10 NOTE — Patient Instructions (Signed)
It was nice to meet you today  We will forward your information over to our research nurses to reach out to you about the CLEAR clinical trial (studying bempedoic acid)  If you do not hear from the nurses within a few weeks, please call Megan at #[phone] to follow up

## 2017-08-17 ENCOUNTER — Other Ambulatory Visit: Payer: Self-pay | Admitting: *Deleted

## 2017-08-17 ENCOUNTER — Telehealth: Payer: Self-pay | Admitting: *Deleted

## 2017-08-17 MED ORDER — FUROSEMIDE 20 MG PO TABS: 20.0000 mg | ORAL_TABLET | Freq: Every day | ORAL | 3 refills | Status: DC | PRN

## 2017-08-17 NOTE — Telephone Encounter (Signed)
Patient called and stated that she saw the pharmacist here in the office on 08/10/17 and she was supposed to send an rx for furosemide to [employer]. [facility]. She is aware that I will send this to the correct mail order pharmacy for her. Patient verbalized her understanding and appreciation.

## 2017-08-17 NOTE — Telephone Encounter (Signed)
Spoke with patient and arranged a screening appointment for the Clear Research Study on Thursday August 23, 2017 @1000 .  Questions encouraged and answered.

## 2017-08-29 ENCOUNTER — Encounter: Payer: Self-pay | Admitting: *Deleted

## 2017-08-29 DIAGNOSIS — Z006 Encounter for examination for normal comparison and control in clinical research program: Secondary | ICD-10-CM

## 2017-08-29 NOTE — Progress Notes (Signed)
Patient to research clinic for screening S1-W5 visit in the Clear Research Study.  All elements of the informed consent form, study requirements and expectations were reviewed with the subject.  All questions and concerns were identified and addressed prior to the signing of the consent.  No procedures were performed prior to consenting the subject.  The subject was given an adequate amount of time to make an informed decision.  A copy of the consent was provided to the patient to take home. US version 5.1, 28Aug2018 Local version, 29Aug2018

## 2017-09-06 ENCOUNTER — Encounter: Payer: Self-pay | Admitting: *Deleted

## 2017-09-14 ENCOUNTER — Other Ambulatory Visit: Payer: Self-pay | Admitting: *Deleted

## 2017-09-14 ENCOUNTER — Encounter: Payer: Self-pay | Admitting: *Deleted

## 2017-09-14 DIAGNOSIS — Z006 Encounter for examination for normal comparison and control in clinical research program: Secondary | ICD-10-CM

## 2017-09-14 NOTE — Progress Notes (Signed)
Subject to Research clinic for visit S2-W4 in the Clear Research study.  No c/o, aes or saes to report.  Will fax screening labs to Dr Paulino RilyWolters for follow up. Run-in meds dispensed and next appointment scheduled.

## 2017-09-20 ENCOUNTER — Telehealth: Payer: Self-pay | Admitting: *Deleted

## 2017-09-20 DIAGNOSIS — L821 Other seborrheic keratosis: Secondary | ICD-10-CM | POA: Diagnosis not present

## 2017-09-20 DIAGNOSIS — R946 Abnormal results of thyroid function studies: Secondary | ICD-10-CM | POA: Diagnosis not present

## 2017-09-20 NOTE — Telephone Encounter (Signed)
Spoke with patient regarding faxing of labs to Dr. Mila PalmerSharon Wolters, which were drawn for the CLEAR Research study.  Faxed the labs on Jan. 8, 2019 and Jan. 10, 2019.  [facility] that the labs were in her file for Dr. Paulino RilyWolters to review.

## 2017-09-29 ENCOUNTER — Other Ambulatory Visit: Payer: Self-pay | Admitting: Cardiovascular Disease

## 2017-09-29 DIAGNOSIS — I25810 Atherosclerosis of coronary artery bypass graft(s) without angina pectoris: Secondary | ICD-10-CM

## 2017-09-29 DIAGNOSIS — I10 Essential (primary) hypertension: Secondary | ICD-10-CM

## 2017-10-01 ENCOUNTER — Other Ambulatory Visit: Payer: Self-pay | Admitting: Pharmacist

## 2017-10-01 MED ORDER — METOPROLOL TARTRATE 100 MG PO TABS: 100.0000 mg | ORAL_TABLET | Freq: Two times a day (BID) | ORAL | 3 refills | Status: DC

## 2017-10-01 NOTE — Addendum Note (Signed)
Addended by: Demetrios LollBARNARD, CATHY C on: 10/01/2017 12:04 PM   Modules accepted: Orders

## 2017-10-05 DIAGNOSIS — R946 Abnormal results of thyroid function studies: Secondary | ICD-10-CM | POA: Diagnosis not present

## 2017-10-12 ENCOUNTER — Encounter: Payer: Self-pay | Admitting: *Deleted

## 2017-10-12 ENCOUNTER — Other Ambulatory Visit: Payer: Self-pay | Admitting: *Deleted

## 2017-10-12 DIAGNOSIS — Z006 Encounter for examination for normal comparison and control in clinical research program: Secondary | ICD-10-CM

## 2017-10-12 NOTE — Progress Notes (Signed)
Subject to research clinic for visit T1-W0 in the Clear research study.  No c/o, aes or saes to report.  Subject has increased her metoprolol to 100 mg bid.  She also is scheduling an appointment with an endocronologist after referral from her PCP Dr Paulino RilyWolters.

## 2017-11-08 ENCOUNTER — Encounter: Payer: Self-pay | Admitting: Endocrinology

## 2017-11-08 ENCOUNTER — Ambulatory Visit (INDEPENDENT_AMBULATORY_CARE_PROVIDER_SITE_OTHER): Payer: Medicare HMO | Admitting: Endocrinology

## 2017-11-08 DIAGNOSIS — E0590 Thyrotoxicosis, unspecified without thyrotoxic crisis or storm: Secondary | ICD-10-CM | POA: Diagnosis not present

## 2017-11-08 NOTE — Progress Notes (Signed)
Subjective:    Patient ID: Samantha Short, female    DOB: 1946/04/24, 72 y.o.   MRN: [id number]  HPI Pt is referred by Dr Paulino RilyWolters, for hyperthyroidism.  Pt reports he was dx'ed with hyperthyroidism in early 2019.  she has never been on therapy for this.  she has never had XRT to the anterior neck, or thyroid surgery.  she has never had dedicated thyroid imaging.  she does not consume kelp or any other non-prescribed thyroid medication.  she has never been on amiodarone.  She has slight palpitations in the chest, and assoc intermitt diarrhea Past Medical History:  Diagnosis Date  . Asthma   . Chronic diastolic CHF (congestive heart failure) (HCC)    a. 02/2011 Echo: EF 55-60%, Gr2 DD, Mild MR  . Coronary artery disease    a. s/p CABG x 1 2010:  LIMA->LAD.;  b. amdx for CP => LHC 07/04/12: LAD 70-80%, mid RCA 30%, LIMA-LAD patent with competitive flow limiting distal LAD filling, EF 70% with hyperdynamic LV function. Medical therapy continued.  . Dyslipidemia   . GERD (gastroesophageal reflux disease)   . Hyperlipidemia   . Hypertension   . Mitral regurgitation    a. mild by echo 02/2011.    Past Surgical History:  Procedure Laterality Date  . CARDIAC CATHETERIZATION  07/27/09 & 07/28/09  . CESAREAN SECTION    . CESAREAN SECTION    . CORONARY ARTERY BYPASS GRAFT  08/03/2009   x1 using left internal mammary artery to distal left anterior  descending coronary artery.   Marland Kitchen. GALLBLADDER SURGERY  2001  . LEFT HEART CATHETERIZATION WITH CORONARY/GRAFT ANGIOGRAM N/A 07/05/2012   Procedure: LEFT HEART CATHETERIZATION WITH Isabel CapriceORONARY/GRAFT ANGIOGRAM;  Surgeon: Wendall StadePeter C Nishan, MD;  [facility];  Service: Cardiovascular;  Laterality: N/A;  . SHOULDER SURGERY  1998 / 2001   from accident  . VESICOVAGINAL FISTULA CLOSURE W/ TAH  1998    Social History   Socioeconomic History  . Marital status: Married    Spouse name: Not on file  . Number of children: Not on file  . Years of  education: Not on file  . Highest education level: Not on file  Social Needs  . Financial resource strain: Not on file  . Food insecurity - worry: Not on file  . Food insecurity - inability: Not on file  . Transportation needs - medical: Not on file  . Transportation needs - non-medical: Not on file  Occupational History  . Not on file  Tobacco Use  . Smoking status: Former Smoker    Years: 7.00    Types: Cigarettes    Last attempt to quit: 07/12/2010    Years since quitting: 7.3  . Smokeless tobacco: Never Used  Substance and Sexual Activity  . Alcohol use: No  . Drug use: No  . Sexual activity: Not on file  Other Topics Concern  . Not on file  Social History Narrative  . Not on file    Current Outpatient Medications on File Prior to Visit  Medication Sig Dispense Refill  . albuterol (PROVENTIL HFA;VENTOLIN HFA) 108 (90 BASE) MCG/ACT inhaler Inhale 1-2 puffs into the lungs every 6 (six) hours as needed for wheezing or shortness of breath.    . AMBULATORY NON FORMULARY MEDICATION Take 180 mg by mouth daily. Medication Name: bempedoic acid 180 mg vs placebo, study drug provided    . aspirin 81 MG tablet Take 81 mg by mouth daily.    .Marland Kitchen  furosemide (LASIX) 20 MG tablet Take 1 tablet (20 mg total) by mouth daily as needed. 90 tablet 3  . lisinopril (PRINIVIL,ZESTRIL) 40 MG tablet TAKE 1 TABLET DAILY 90 tablet 3  . metoprolol tartrate (LOPRESSOR) 100 MG tablet Take 1 tablet (100 mg total) by mouth 2 (two) times daily. 180 tablet 3  . Multiple Vitamin (MULTIVITAMIN WITH MINERALS) TABS tablet Take 1 tablet by mouth daily.    . nitroGLYCERIN (NITROSTAT) 0.4 MG SL tablet PLACE 1 TABLET UNDER THE   TONGUE AND ALLOW TO        DISSOLVE EVERY 5 MINUTES ASNEEDED FOR CHEST PAIN 75 tablet 1   No current facility-administered medications on file prior to visit.     Allergies  Allergen Reactions  . Ceclor [Cefaclor] Other (See Comments)    Lost vision in eye  . Hctz [Hydrochlorothiazide]  Other (See Comments)    Renal insufficiency  . Lipitor [Atorvastatin Calcium] Other (See Comments)    Increased A1C  . Norvasc [Amlodipine] Other (See Comments)    Makes patient stiff  . Penicillins Hives, Shortness Of Breath, Itching and Rash  . Sulfa Antibiotics Other (See Comments)    CAUSES SHOCK  . Statins Other (See Comments)    MYALGIAS AND WEAKNESS  . Erythromycin Rash  . Latex Rash  . Levofloxacin Rash  . Tetracyclines & Related Rash    Family History  Problem Relation Age of Onset  . Cancer Father   . Heart attack Father   . Heart attack Mother   . Heart disease Mother        had pacemaker  . Cancer Brother   . Heart disease Brother   . Heart disease Sister   . Cancer Sister   . Angina Sister   . Coronary artery disease Son   . Hypertension Sister   . Hypertension Brother   . Thyroid disease Neg Hx     BP 140/80 (BP Location: Left Arm, Patient Position: Sitting, Cuff Size: Large)   Pulse 67   Ht 5\' 2"  (1.575 m)   Wt 213 lb (96.6 kg)   SpO2 98%   BMI 38.96 kg/m     Review of Systems denies weight loss, headache, hoarseness, diplopia, sob, vomiting, polyuria, muscle weakness, edema, excessive diaphoresis, tremor, and heat intolerance. She has diaphoresis, anxiety, easy bruising, and rhinorrhea.      Objective:   Physical Exam VS: see vs page GEN: no distress HEAD: head: no deformity eyes: no periorbital swelling, no proptosis external nose and ears are normal mouth: no lesion seen NECK: 2 cm RUP thyroid nodule is noted CHEST WALL: no deformity LUNGS: clear to auscultation CV: reg rate and rhythm, no murmur ABD: abdomen is soft, nontender.  no hepatosplenomegaly.  not distended.  no hernia MUSCULOSKELETAL: muscle bulk and strength are grossly normal.  no obvious joint swelling.  gait is normal and steady EXTEMITIES: no deformity.  no edema PULSES: no carotid bruit NEURO:  cn 2-12 grossly intact.   readily moves all 4's.  sensation is intact to  touch on all 4's.  Slight tremor of the hands SKIN:  Normal texture and temperature.  No rash or suspicious lesion is visible.  Not diaphoretic NODES:  None palpable at the neck PSYCH: alert, well-oriented.  Does not appear anxious nor depressed.   CT (2008): Possible small thyroid cysts bilaterally.  outside test results are reviewed: TSH=undetectable  I personally reviewed electrocardiogram tracing (07/30/17): Indication: CAD Impression: NSR.  No MI.  No hypertrophy.  RBBB Compared to 2017: no significant change     Assessment & Plan:  Hyperthyroidism: new.  We discussed rx options.  She chooses RAI  Patient Instructions  let's check a thyroid "scan" (a special, but easy and painless type of thyroid x ray).  It works like this: you go to the x-ray department of the hospital to swallow a pill, which contains a miniscule amount of radiation.  You will not notice any symptoms from this.  You will go back to the x-ray department the next day, to lie down in front of a camera.  The results of this will be sent to me.   Based on the results, i hope to order for you a treatment pill of radioactive iodine.  Although it is a larger amount of radiation, you will again notice no symptoms from this.  The pill is gone from your body in a few days (during which you should stay away from other people), but takes several months to work.  Therefore, please return here approximately 6-8 weeks after the treatment.  This treatment has been available for many years, and the only known side-effect is an underactive thyroid.  It is possible that i would eventually prescribe for you a thyroid hormone pill, which is very inexpensive.  You don't have to worry about side-effects of this thyroid hormone pill, because it is the same molecule your thyroid makes.

## 2017-11-08 NOTE — Patient Instructions (Signed)

## 2017-11-12 ENCOUNTER — Telehealth: Payer: Self-pay | Admitting: Endocrinology

## 2017-11-15 ENCOUNTER — Telehealth: Payer: Self-pay | Admitting: Endocrinology

## 2017-11-15 NOTE — Telephone Encounter (Signed)
Patient stated that radiology haven't called her to schedule an appointment . Please advise

## 2017-11-16 ENCOUNTER — Encounter: Payer: Self-pay | Admitting: *Deleted

## 2017-11-16 DIAGNOSIS — Z006 Encounter for examination for normal comparison and control in clinical research program: Secondary | ICD-10-CM

## 2017-11-16 NOTE — Progress Notes (Signed)
Subject to research clinic for visit T2-M1 in the Clear research study. No c/o, aes or saes to report.  Subject still establishing with PCP.  100% compliant with meds and remaining pills dispensed back to subject.  Next appointment scheduled.

## 2017-11-19 ENCOUNTER — Telehealth: Payer: Self-pay | Admitting: Endocrinology

## 2017-11-19 NOTE — Telephone Encounter (Signed)
Patient just want Dr Everardo AllEllison to know she has her appointment scheduled for her thyroid up take scan.

## 2017-11-26 ENCOUNTER — Encounter (HOSPITAL_COMMUNITY)
Admission: RE | Admit: 2017-11-26 | Discharge: 2017-11-26 | Disposition: A | Discharge status: Home / Self Care | Payer: Medicare HMO | Source: Ambulatory Visit | Attending: Endocrinology | Admitting: Endocrinology

## 2017-11-26 DIAGNOSIS — E0520 Thyrotoxicosis with toxic multinodular goiter without thyrotoxic crisis or storm: Secondary | ICD-10-CM | POA: Insufficient documentation

## 2017-11-26 DIAGNOSIS — E0590 Thyrotoxicosis, unspecified without thyrotoxic crisis or storm: Secondary | ICD-10-CM

## 2017-11-26 DIAGNOSIS — Z170 Estrogen receptor positive status [ER+]: Secondary | ICD-10-CM | POA: Diagnosis not present

## 2017-11-26 DIAGNOSIS — Z79818 Long term (current) use of other agents affecting estrogen receptors and estrogen levels: Secondary | ICD-10-CM | POA: Diagnosis not present

## 2017-11-26 MED ORDER — SODIUM IODIDE I 131 CAPSULE
10.5000 | Freq: Once | INTRAVENOUS | Status: AC | PRN
  Administered 2017-11-26: 10.5 via ORAL

## 2017-11-27 ENCOUNTER — Encounter (HOSPITAL_COMMUNITY)
Admission: RE | Admit: 2017-11-27 | Discharge: 2017-11-27 | Disposition: A | Discharge status: Home / Self Care | Payer: Medicare HMO | Source: Ambulatory Visit | Attending: Endocrinology | Admitting: Endocrinology

## 2017-11-27 ENCOUNTER — Other Ambulatory Visit: Payer: Self-pay | Admitting: Endocrinology

## 2017-11-27 DIAGNOSIS — E042 Nontoxic multinodular goiter: Secondary | ICD-10-CM | POA: Diagnosis not present

## 2017-11-27 DIAGNOSIS — E0590 Thyrotoxicosis, unspecified without thyrotoxic crisis or storm: Secondary | ICD-10-CM

## 2017-11-27 MED ORDER — SODIUM PERTECHNETATE TC 99M INJECTION
10.5000 | Freq: Once | INTRAVENOUS | Status: AC | PRN
  Administered 2017-11-27: 10.5 via INTRAVENOUS

## 2017-12-05 ENCOUNTER — Telehealth: Payer: Self-pay | Admitting: Endocrinology

## 2017-12-05 NOTE — Telephone Encounter (Signed)
Patient stated she is scheduled to get to iodine done and needs more information on this before this is done. Also states she needs a referral for insurance that she would like to discuss with someone   Please advise

## 2017-12-05 NOTE — Telephone Encounter (Signed)
I will wait to hear back from patient & see what insurance company advises.

## 2017-12-05 NOTE — Telephone Encounter (Signed)
I am unaware of anything we could do.

## 2017-12-05 NOTE — Telephone Encounter (Signed)
I spoke with patient & she said that insurance needed PA for her to be able to receive reimbursement for room & board for after RAI pill. Patient is guardian of disabled grandchild & has pets. I advised patient to call back insurance to see if there is any paperwork that can be faxed for PA. I am unaware of how to do a PA for something of this nature?

## 2017-12-06 ENCOUNTER — Telehealth: Payer: Self-pay | Admitting: Endocrinology

## 2017-12-06 NOTE — Telephone Encounter (Signed)
I spoke with patient & she stated that I spoke to [employer] insurance this morning & it seemed to be unclear what we were trying to get covered. I was told by insurance that there was no PA or precerification needed. I explained when talking to patient that the hospital would not keep her after RAI pill & that Dr. Everardo AllEllison stated maybe there was another treatment better suited for her since they would not be. Patient stated that she wants treatment because she feels so bad & will call back her insurance to try to get them cover room & board after RAI treatment.

## 2017-12-06 NOTE — Telephone Encounter (Signed)
Patient is returning your call, leave a detailed message if she do not answer.

## 2017-12-12 ENCOUNTER — Ambulatory Visit: Payer: Medicare HMO | Admitting: Endocrinology

## 2017-12-13 ENCOUNTER — Encounter (HOSPITAL_COMMUNITY): Payer: Medicare HMO

## 2017-12-18 NOTE — Telephone Encounter (Signed)
error 

## 2017-12-27 ENCOUNTER — Telehealth: Payer: Self-pay | Admitting: Endocrinology

## 2017-12-27 ENCOUNTER — Encounter (HOSPITAL_COMMUNITY)
Admission: RE | Admit: 2017-12-27 | Discharge: 2017-12-27 | Disposition: A | Discharge status: Home / Self Care | Payer: Medicare HMO | Source: Ambulatory Visit | Attending: Endocrinology | Admitting: Endocrinology

## 2017-12-27 ENCOUNTER — Encounter (HOSPITAL_COMMUNITY): Payer: Self-pay

## 2017-12-27 DIAGNOSIS — E0590 Thyrotoxicosis, unspecified without thyrotoxic crisis or storm: Secondary | ICD-10-CM

## 2017-12-27 DIAGNOSIS — E042 Nontoxic multinodular goiter: Secondary | ICD-10-CM | POA: Diagnosis not present

## 2017-12-27 MED ORDER — SODIUM IODIDE I 131 CAPSULE
28.9000 | Freq: Once | INTRAVENOUS | Status: AC | PRN
  Administered 2017-12-27: 28.9 via ORAL

## 2017-12-27 NOTE — Telephone Encounter (Signed)
Patient is asking if Dr Everardo AllEllison can call her something in for nausea today please she is asking. \ [id number]- [place], [place] - [address] [phone] (Phone) [phone] (Fax)

## 2017-12-31 NOTE — Telephone Encounter (Signed)
The iodine is out of your system, so other causes of the nausea should be sought.  Please see PCP for this.

## 2017-12-31 NOTE — Telephone Encounter (Signed)
Pt stated that she felt better today

## 2018-01-11 ENCOUNTER — Encounter: Payer: Self-pay | Admitting: *Deleted

## 2018-01-11 DIAGNOSIS — Z006 Encounter for examination for normal comparison and control in clinical research program: Secondary | ICD-10-CM

## 2018-01-11 NOTE — Progress Notes (Addendum)
Subject to research clinic for visit T3-M3 in the Clear Research study.  No cos or saes to report. New diagnosis for hyperthyroidism on 11/08/17 after subject established care with a PCP. 1outpatient visit to [place] for RAI therapy for hyperthyroidism. 100 % compliant with drug and new drug dispensed. Next clinic visit scheduled.  Re-consented to [place] version 6.0.

## 2018-01-15 ENCOUNTER — Telehealth: Payer: Self-pay | Admitting: *Deleted

## 2018-01-15 NOTE — Telephone Encounter (Addendum)
Patient is in the Clear Research Study.  Visit T3M3 labs came back with a Potassium level of 5.8.  Patient notified and is coming back to the clinic for a Potassium re-draw today at 9:30. Dr. Jens Som notified.

## 2018-01-16 ENCOUNTER — Telehealth: Payer: Self-pay | Admitting: *Deleted

## 2018-01-16 NOTE — Telephone Encounter (Signed)
Notified subject of repeat K+ lab draw.  Local [facility] results 4.9.  Subject notified and Dr. Jens Som.

## 2018-01-30 ENCOUNTER — Ambulatory Visit (INDEPENDENT_AMBULATORY_CARE_PROVIDER_SITE_OTHER): Payer: Medicare HMO | Admitting: Ophthalmology

## 2018-01-30 DIAGNOSIS — H35033 Hypertensive retinopathy, bilateral: Secondary | ICD-10-CM | POA: Diagnosis not present

## 2018-01-30 DIAGNOSIS — H353132 Nonexudative age-related macular degeneration, bilateral, intermediate dry stage: Secondary | ICD-10-CM

## 2018-01-30 DIAGNOSIS — H43813 Vitreous degeneration, bilateral: Secondary | ICD-10-CM

## 2018-01-30 DIAGNOSIS — I10 Essential (primary) hypertension: Secondary | ICD-10-CM | POA: Diagnosis not present

## 2018-02-08 ENCOUNTER — Ambulatory Visit (INDEPENDENT_AMBULATORY_CARE_PROVIDER_SITE_OTHER): Payer: Medicare HMO | Admitting: Endocrinology

## 2018-02-08 ENCOUNTER — Encounter: Payer: Self-pay | Admitting: Endocrinology

## 2018-02-08 VITALS — BP 130/80 | HR 68 | Ht 62.0 in | Wt 216.0 lb

## 2018-02-08 DIAGNOSIS — E0520 Thyrotoxicosis with toxic multinodular goiter without thyrotoxic crisis or storm: Secondary | ICD-10-CM | POA: Diagnosis not present

## 2018-02-08 DIAGNOSIS — R635 Abnormal weight gain: Secondary | ICD-10-CM | POA: Diagnosis not present

## 2018-02-08 DIAGNOSIS — E0590 Thyrotoxicosis, unspecified without thyrotoxic crisis or storm: Secondary | ICD-10-CM

## 2018-02-08 LAB — TSH: TSH: 0.26 u[IU]/mL — ABNORMAL LOW (ref 0.35–4.50)

## 2018-02-08 LAB — T4, FREE: FREE T4: 1.3 ng/dL (ref 0.60–1.60)

## 2018-02-08 NOTE — Patient Instructions (Addendum)
blood tests are requested for you today.  We'll let you know about the results. Please see a weight loss specialist.  you will receive a phone call, about a day and time for an appointment Please come back for a follow-up appointment in 1 month.

## 2018-02-08 NOTE — Progress Notes (Signed)
Subjective:    Patient ID: Samantha Short, female    DOB: 03/16/1946, 72 y.o.   MRN: [id number]  HPI Pt returns for f/u of hyperthyroidism (dx'ed early 2019; CT (2008) showed small thyroid cysts; nuc med scan was c/w multinodular goiter; she had RAI 4/19).  pt states she feels well in general.  Specifically, palpitations are less.    Past Medical History:  Diagnosis Date  . Asthma   . Chronic diastolic CHF (congestive heart failure) (HCC)    a. 02/2011 Echo: EF 55-60%, Gr2 DD, Mild MR  . Coronary artery disease    a. s/p CABG x 1 2010:  LIMA->LAD.;  b. amdx for CP => LHC 07/04/12: LAD 70-80%, mid RCA 30%, LIMA-LAD patent with competitive flow limiting distal LAD filling, EF 70% with hyperdynamic LV function. Medical therapy continued.  . Dyslipidemia   . GERD (gastroesophageal reflux disease)   . Hyperlipidemia   . Hypertension   . Mitral regurgitation    a. mild by echo 02/2011.    Past Surgical History:  Procedure Laterality Date  . CARDIAC CATHETERIZATION  07/27/09 & 07/28/09  . CESAREAN SECTION    . CESAREAN SECTION    . CORONARY ARTERY BYPASS GRAFT  08/03/2009   x1 using left internal mammary artery to distal left anterior  descending coronary artery.   Marland Kitchen GALLBLADDER SURGERY  2001  . LEFT HEART CATHETERIZATION WITH CORONARY/GRAFT ANGIOGRAM N/A 07/05/2012   Procedure: LEFT HEART CATHETERIZATION WITH Isabel Caprice;  Surgeon: Wendall Stade, MD;  Location: [facility] CATH LAB;  Service: Cardiovascular;  Laterality: N/A;  . SHOULDER SURGERY  1998 / 2001   from accident  . VESICOVAGINAL FISTULA CLOSURE W/ TAH  1998    Social History   Socioeconomic History  . Marital status: Widowed    Spouse name: Not on file  . Number of children: Not on file  . Years of education: Not on file  . Highest education level: Not on file  Occupational History  . Not on file  Social Needs  . Financial resource strain: Not on file  . Food insecurity:    Worry: Not on file   Inability: Not on file  . Transportation needs:    Medical: Not on file    Non-medical: Not on file  Tobacco Use  . Smoking status: Former Smoker    Years: 7.00    Types: Cigarettes    Last attempt to quit: 07/12/2010    Years since quitting: 7.5  . Smokeless tobacco: Never Used  Substance and Sexual Activity  . Alcohol use: No  . Drug use: No  . Sexual activity: Not on file  Lifestyle  . Physical activity:    Days per week: Not on file    Minutes per session: Not on file  . Stress: Not on file  Relationships  . Social connections:    Talks on phone: Not on file    Gets together: Not on file    Attends religious service: Not on file    Active member of club or organization: Not on file    Attends meetings of clubs or organizations: Not on file    Relationship status: Not on file  . Intimate partner violence:    Fear of current or ex partner: Not on file    Emotionally abused: Not on file    Physically abused: Not on file    Forced sexual activity: Not on file  Other Topics Concern  . Not on file  Social History Narrative  . Not on file    Current Outpatient Medications on File Prior to Visit  Medication Sig Dispense Refill  . AMBULATORY NON FORMULARY MEDICATION Take 180 mg by mouth daily. Medication Name: bempedoic acid 180 mg vs placebo, study drug provided    . aspirin 81 MG tablet Take 81 mg by mouth daily.    Marland Kitchen lisinopril (PRINIVIL,ZESTRIL) 40 MG tablet TAKE 1 TABLET DAILY 90 tablet 3  . metoprolol tartrate (LOPRESSOR) 100 MG tablet Take 1 tablet (100 mg total) by mouth 2 (two) times daily. 180 tablet 3  . Multiple Vitamin (MULTIVITAMIN WITH MINERALS) TABS tablet Take 1 tablet by mouth daily.    . nitroGLYCERIN (NITROSTAT) 0.4 MG SL tablet PLACE 1 TABLET UNDER THE   TONGUE AND ALLOW TO        DISSOLVE EVERY 5 MINUTES ASNEEDED FOR CHEST PAIN 75 tablet 1  . furosemide (LASIX) 20 MG tablet Take 1 tablet (20 mg total) by mouth daily as needed. 90 tablet 3   No current  facility-administered medications on file prior to visit.     Allergies  Allergen Reactions  . Ceclor [Cefaclor] Other (See Comments)    Lost vision in eye  . Hctz [Hydrochlorothiazide] Other (See Comments)    Renal insufficiency  . Lipitor [Atorvastatin Calcium] Other (See Comments)    Increased A1C  . Norvasc [Amlodipine] Other (See Comments)    Makes patient stiff  . Penicillins Hives, Shortness Of Breath, Itching and Rash  . Sulfa Antibiotics Other (See Comments)    CAUSES SHOCK  . Statins Other (See Comments)    MYALGIAS AND WEAKNESS  . Erythromycin Rash  . Latex Rash  . Levofloxacin Rash  . Tetracyclines & Related Rash    Family History  Problem Relation Age of Onset  . Cancer Father   . Heart attack Father   . Heart attack Mother   . Heart disease Mother        had pacemaker  . Cancer Brother   . Heart disease Brother   . Heart disease Sister   . Cancer Sister   . Angina Sister   . Coronary artery disease Son   . Hypertension Sister   . Hypertension Brother   . Thyroid disease Neg Hx     BP 130/80 (BP Location: Left Arm, Patient Position: Sitting, Cuff Size: Normal)   Pulse 68   Ht  (1.575 m)   Wt 216 lb (98 kg)   SpO2 98%   BMI 39.51 kg/m    Review of Systems Denies heat intolerance.     Objective:   Physical Exam VITAL SIGNS:  See vs page GENERAL: no distress NECK: 2 cm RUP thyroid nodule is noted.      Assessment & Plan:  Multinodular goiter: clinically unchanged Hyperthyroidism: due for recheck  Patient Instructions  blood tests are requested for you today.  We'll let you know about the results. Please see a weight loss specialist.  you will receive a phone call, about a day and time for an appointment Please come back for a follow-up appointment in 1 month.

## 2018-02-11 ENCOUNTER — Telehealth: Payer: Self-pay | Admitting: Endocrinology

## 2018-02-11 NOTE — Telephone Encounter (Signed)
Patient is calling for lab results.

## 2018-02-12 ENCOUNTER — Telehealth: Payer: Self-pay

## 2018-02-12 ENCOUNTER — Telehealth: Payer: Self-pay | Admitting: Endocrinology

## 2018-02-12 NOTE — Telephone Encounter (Signed)
Patient calling for lab results.   Please advise.

## 2018-02-12 NOTE — Telephone Encounter (Signed)
See result notes. 

## 2018-02-12 NOTE — Telephone Encounter (Signed)
I reviewed lab results with patient.

## 2018-02-27 DIAGNOSIS — H9201 Otalgia, right ear: Secondary | ICD-10-CM | POA: Diagnosis not present

## 2018-03-13 ENCOUNTER — Encounter: Payer: Self-pay | Admitting: Endocrinology

## 2018-03-13 ENCOUNTER — Ambulatory Visit (INDEPENDENT_AMBULATORY_CARE_PROVIDER_SITE_OTHER): Payer: Medicare HMO | Admitting: Endocrinology

## 2018-03-13 VITALS — BP 158/78 | HR 67 | Wt 220.0 lb

## 2018-03-13 DIAGNOSIS — E0590 Thyrotoxicosis, unspecified without thyrotoxic crisis or storm: Secondary | ICD-10-CM

## 2018-03-13 LAB — TSH: TSH: 1.11 u[IU]/mL (ref 0.35–4.50)

## 2018-03-13 LAB — T4, FREE: FREE T4: 0.99 ng/dL (ref 0.60–1.60)

## 2018-03-13 NOTE — Patient Instructions (Signed)
blood tests are requested for you today.  We'll let you know about the results. Please see a weight loss specialist.  you will receive a phone call, about a day and time for an appointment Please come back for a follow-up appointment in 1 month.

## 2018-03-13 NOTE — Progress Notes (Signed)
Subjective:    Patient ID: Samantha Short, female    DOB: [birth year], 72 y.o.   MRN: [id number]  HPI Pt returns for f/u of hyperthyroidism (dx'ed early 2019; CT (2008) showed small thyroid cysts; nuc med scan was c/w multinodular goiter; she had RAI 4/19).  pt states she feels well in general, except for weight gain    Past Medical History:  Diagnosis Date  . Asthma   . Chronic diastolic CHF (congestive heart failure) (HCC)    a. 02/2011 Echo: EF 55-60%, Gr2 DD, Mild MR  . Coronary artery disease    a. s/p CABG x 1 2010:  LIMA->LAD.;  b. amdx for CP => LHC 07/04/12: LAD 70-80%, mid RCA 30%, LIMA-LAD patent with competitive flow limiting distal LAD filling, EF 70% with hyperdynamic LV function. Medical therapy continued.  . Dyslipidemia   . GERD (gastroesophageal reflux disease)   . Hyperlipidemia   . Hypertension   . Mitral regurgitation    a. mild by echo 02/2011.    Past Surgical History:  Procedure Laterality Date  . CARDIAC CATHETERIZATION  07/27/09 & 07/28/09  . CESAREAN SECTION    . CESAREAN SECTION    . CORONARY ARTERY BYPASS GRAFT  08/03/2009   x1 using left internal mammary artery to distal left anterior  descending coronary artery.   Marland Kitchen GALLBLADDER SURGERY  2001  . LEFT HEART CATHETERIZATION WITH CORONARY/GRAFT ANGIOGRAM N/A 07/05/2012   Procedure: LEFT HEART CATHETERIZATION WITH Isabel Caprice;  Surgeon: Wendall Stade, MD;  Location: [facility] CATH LAB;  Service: Cardiovascular;  Laterality: N/A;  . SHOULDER SURGERY  1998 / 2001   from accident  . VESICOVAGINAL FISTULA CLOSURE W/ TAH  1998    Social History   Socioeconomic History  . Marital status: Widowed    Spouse name: Not on file  . Number of children: Not on file  . Years of education: Not on file  . Highest education level: Not on file  Occupational History  . Not on file  Social Needs  . Financial resource strain: Not on file  . Food insecurity:    Worry: Not on file    Inability: Not on file    . Transportation needs:    Medical: Not on file    Non-medical: Not on file  Tobacco Use  . Smoking status: Former Smoker    Years: 7.00    Types: Cigarettes    Last attempt to quit: 07/12/2010    Years since quitting: 7.6  . Smokeless tobacco: Never Used  Substance and Sexual Activity  . Alcohol use: No  . Drug use: No  . Sexual activity: Not on file  Lifestyle  . Physical activity:    Days per week: Not on file    Minutes per session: Not on file  . Stress: Not on file  Relationships  . Social connections:    Talks on phone: Not on file    Gets together: Not on file    Attends religious service: Not on file    Active member of club or organization: Not on file    Attends meetings of clubs or organizations: Not on file    Relationship status: Not on file  . Intimate partner violence:    Fear of current or ex partner: Not on file    Emotionally abused: Not on file    Physically abused: Not on file    Forced sexual activity: Not on file  Other Topics Concern  . Not  on file  Social History Narrative  . Not on file    Current Outpatient Medications on File Prior to Visit  Medication Sig Dispense Refill  . AMBULATORY NON FORMULARY MEDICATION Take 180 mg by mouth daily. Medication Name: bempedoic acid 180 mg vs placebo, study drug provided    . aspirin 81 MG tablet Take 81 mg by mouth daily.    Marland Kitchen. lisinopril (PRINIVIL,ZESTRIL) 40 MG tablet TAKE 1 TABLET DAILY 90 tablet 3  . metoprolol tartrate (LOPRESSOR) 100 MG tablet Take 1 tablet (100 mg total) by mouth 2 (two) times daily. 180 tablet 3  . Multiple Vitamin (MULTIVITAMIN WITH MINERALS) TABS tablet Take 1 tablet by mouth daily.    . Multiple Vitamins-Minerals (PRESERVISION AREDS PO) Take by mouth daily.    . nitroGLYCERIN (NITROSTAT) 0.4 MG SL tablet PLACE 1 TABLET UNDER THE   TONGUE AND ALLOW TO        DISSOLVE EVERY 5 MINUTES ASNEEDED FOR CHEST PAIN 75 tablet 1  . furosemide (LASIX) 20 MG tablet Take 1 tablet (20 mg  total) by mouth daily as needed. 90 tablet 3  . Multiple Vitamins-Minerals (OCUVITE ADULT 50+ PO) Take by mouth.     No current facility-administered medications on file prior to visit.     Allergies  Allergen Reactions  . Ceclor [Cefaclor] Other (See Comments)    Lost vision in eye  . Hctz [Hydrochlorothiazide] Other (See Comments)    Renal insufficiency  . Lipitor [Atorvastatin Calcium] Other (See Comments)    Increased A1C  . Norvasc [Amlodipine] Other (See Comments)    Makes patient stiff  . Penicillins Hives, Shortness Of Breath, Itching and Rash  . Sulfa Antibiotics Other (See Comments)    CAUSES SHOCK  . Statins Other (See Comments)    MYALGIAS AND WEAKNESS  . Erythromycin Rash  . Latex Rash  . Levofloxacin Rash  . Tetracyclines & Related Rash    Family History  Problem Relation Age of Onset  . Cancer Father   . Heart attack Father   . Heart attack Mother   . Heart disease Mother        had pacemaker  . Cancer Brother   . Heart disease Brother   . Heart disease Sister   . Cancer Sister   . Angina Sister   . Coronary artery disease Son   . Hypertension Sister   . Hypertension Brother   . Thyroid disease Neg Hx     BP (!) 158/78 (BP Location: Right Arm, Patient Position: Sitting, Cuff Size: Normal)   Pulse 67   Wt 220 lb (99.8 kg)   SpO2 94%   BMI 40.24 kg/m    Review of Systems Denies neck swelling    Objective:   Physical Exam VITAL SIGNS:  See vs page GENERAL: no distress NECK: 2 cm RUP thyroid nodule is again noted.   Lab Results  Component Value Date   TSH 1.11 03/13/2018      Assessment & Plan:  Nodular thyroid: clinically unchanged Hyperthyroidism: resolved with RAI  Patient Instructions  blood tests are requested for you today.  We'll let you know about the results. Please see a weight loss specialist.  you will receive a phone call, about a day and time for an appointment Please come back for a follow-up appointment in 1 month.

## 2018-04-12 VITALS — BP 136/78 | HR 69 | Wt 217.8 lb

## 2018-04-12 DIAGNOSIS — Z006 Encounter for examination for normal comparison and control in clinical research program: Secondary | ICD-10-CM

## 2018-04-12 NOTE — Progress Notes (Signed)
[facility] for visit T4M6 in the Clear study.  No complaints, adverse events, or serious adverse events to report.  Subject 100% compliant with medications .New medication dispensed.  Next telephone and clinic appointments scheduled.

## 2018-04-26 ENCOUNTER — Encounter: Payer: Self-pay | Admitting: Endocrinology

## 2018-04-26 ENCOUNTER — Ambulatory Visit (INDEPENDENT_AMBULATORY_CARE_PROVIDER_SITE_OTHER): Payer: Medicare HMO | Admitting: Endocrinology

## 2018-04-26 VITALS — BP 142/80 | HR 64 | Ht 62.0 in | Wt 216.0 lb

## 2018-04-26 DIAGNOSIS — E0590 Thyrotoxicosis, unspecified without thyrotoxic crisis or storm: Secondary | ICD-10-CM | POA: Diagnosis not present

## 2018-04-26 LAB — TSH: TSH: 1.13 u[IU]/mL (ref 0.35–4.50)

## 2018-04-26 LAB — T4, FREE: FREE T4: 1.42 ng/dL (ref 0.60–1.60)

## 2018-04-26 NOTE — Patient Instructions (Addendum)
Your blood pressure is high today.  Please see your primary care provider soon, to have it rechecked.   Thyroid blood tests are requested for you today.  We'll let you know about the results.  Please come back for a follow-up appointment in 2 months.

## 2018-04-26 NOTE — Progress Notes (Signed)
Subjective:    Patient ID: Samantha Short, female    DOB: 09/20/1945, 72 y.o.   MRN: [id number]  HPI Pt returns for f/u of hyperthyroidism (dx'ed early 2019; CT (2008) showed small thyroid cysts; nuc med scan was c/w multinodular goiter; she had RAI 4/19; she became euthyroid a few mos later).  pt states brittle nails, and hair loss.   Past Medical History:  Diagnosis Date  . Asthma   . Chronic diastolic CHF (congestive heart failure) (HCC)    a. 02/2011 Echo: EF 55-60%, Gr2 DD, Mild MR  . Coronary artery disease    a. s/p CABG x 1 2010:  LIMA->LAD.;  b. amdx for CP => LHC 07/04/12: LAD 70-80%, mid RCA 30%, LIMA-LAD patent with competitive flow limiting distal LAD filling, EF 70% with hyperdynamic LV function. Medical therapy continued.  . Dyslipidemia   . GERD (gastroesophageal reflux disease)   . Hyperlipidemia   . Hypertension   . Mitral regurgitation    a. mild by echo 02/2011.    Past Surgical History:  Procedure Laterality Date  . CARDIAC CATHETERIZATION  07/27/09 & 07/28/09  . CESAREAN SECTION    . CESAREAN SECTION    . CORONARY ARTERY BYPASS GRAFT  08/03/2009   x1 using left internal mammary artery to distal left anterior  descending coronary artery.   Marland Kitchen. GALLBLADDER SURGERY  2001  . LEFT HEART CATHETERIZATION WITH CORONARY/GRAFT ANGIOGRAM N/A 07/05/2012   Procedure: LEFT HEART CATHETERIZATION WITH Isabel CapriceORONARY/GRAFT ANGIOGRAM;  Surgeon: Wendall StadePeter C Nishan, MD;  [facility] C[facility] LAB;  Service: Cardiovascular;  Laterality: N/A;  . SHOULDER SURGERY  1998 / 2001   from accident  . VESICOVAGINAL FISTULA CLOSURE W/ TAH  1998    Social History   Socioeconomic History  . Marital status: Widowed    Spouse name: Not on file  . Number of children: Not on file  . Years of education: Not on file  . Highest education level: Not on file  Occupational History  . Not on file  Social Needs  . Financial resource strain: Not on file  . Food insecurity:    Worry: Not on file   Inability: Not on file  . Transportation needs:    Medical: Not on file    Non-medical: Not on file  Tobacco Use  . Smoking status: Former Smoker    Years: 7.00    Types: Cigarettes    Last attempt to quit: 07/12/2010    Years since quitting: 7.8  . Smokeless tobacco: Never Used  Substance and Sexual Activity  . Alcohol use: No  . Drug use: No  . Sexual activity: Not on file  Lifestyle  . Physical activity:    Days per week: Not on file    Minutes per session: Not on file  . Stress: Not on file  Relationships  . Social connections:    Talks on phone: Not on file    Gets together: Not on file    Attends religious service: Not on file    Active member of club or organization: Not on file    Attends meetings of clubs or organizations: Not on file    Relationship status: Not on file  . Intimate partner violence:    Fear of current or ex partner: Not on file    Emotionally abused: Not on file    Physically abused: Not on file    Forced sexual activity: Not on file  Other Topics Concern  . Not on  file  Social History Narrative  . Not on file    Current Outpatient Medications on File Prior to Visit  Medication Sig Dispense Refill  . AMBULATORY NON FORMULARY MEDICATION Take 180 mg by mouth daily. Medication Name: bempedoic acid 180 mg vs placebo, study drug provided    . aspirin 81 MG tablet Take 81 mg by mouth daily.    . furosemide (LASIX) 20 MG tablet Take 1 tablet (20 mg total) by mouth daily as needed. 90 tablet 3  . lisinopril (PRINIVIL,ZESTRIL) 40 MG tablet TAKE 1 TABLET DAILY 90 tablet 3  . metoprolol tartrate (LOPRESSOR) 100 MG tablet Take 1 tablet (100 mg total) by mouth 2 (two) times daily. 180 tablet 3  . Multiple Vitamin (MULTIVITAMIN WITH MINERALS) TABS tablet Take 1 tablet by mouth daily.    . Multiple Vitamins-Minerals (OCUVITE ADULT 50+ PO) Take by mouth.    . Multiple Vitamins-Minerals (PRESERVISION AREDS PO) Take by mouth daily.    . nitroGLYCERIN  (NITROSTAT) 0.4 MG SL tablet PLACE 1 TABLET UNDER THE   TONGUE AND ALLOW TO        DISSOLVE EVERY 5 MINUTES ASNEEDED FOR CHEST PAIN 75 tablet 1   No current facility-administered medications on file prior to visit.     Allergies  Allergen Reactions  . Ceclor [Cefaclor] Other (See Comments)    Lost vision in eye  . Hctz [Hydrochlorothiazide] Other (See Comments)    Renal insufficiency  . Lipitor [Atorvastatin Calcium] Other (See Comments)    Increased A1C  . Norvasc [Amlodipine] Other (See Comments)    Makes patient stiff  . Penicillins Hives, Shortness Of Breath, Itching and Rash  . Sulfa Antibiotics Other (See Comments)    CAUSES SHOCK  . Statins Other (See Comments)    MYALGIAS AND WEAKNESS  . Erythromycin Rash  . Latex Rash  . Levofloxacin Rash  . Tetracyclines & Related Rash    Family History  Problem Relation Age of Onset  . Cancer Father   . Heart attack Father   . Heart attack Mother   . Heart disease Mother        had pacemaker  . Cancer Brother   . Heart disease Brother   . Heart disease Sister   . Cancer Sister   . Angina Sister   . Coronary artery disease Son   . Hypertension Sister   . Hypertension Brother   . Thyroid disease Neg Hx     BP (!) 142/80 (BP Location: Right Arm, Patient Position: Sitting, Cuff Size: Small)   Pulse 64   Ht 5\' 2"  (1.575 m)   Wt 216 lb (98 kg)   SpO2 96%   BMI 39.51 kg/m    Review of Systems She does not notice any change in the size of the goiter.      Objective:   Physical Exam VITAL SIGNS:  See vs page GENERAL: no distress NECK: 2 cm RUP thyroid nodule is once again noted.     Assessment & Plan:  HTN: is noted today Hyperthyroidism: recheck today Nodular goiter: clinically unchanged  Patient Instructions  Your blood pressure is high today.  Please see your primary care provider soon, to have it rechecked.   Thyroid blood tests are requested for you today.  We'll let you know about the results.  Please  come back for a follow-up appointment in 2 months.

## 2018-05-02 ENCOUNTER — Encounter (INDEPENDENT_AMBULATORY_CARE_PROVIDER_SITE_OTHER): Payer: Medicare HMO

## 2018-05-06 ENCOUNTER — Ambulatory Visit (INDEPENDENT_AMBULATORY_CARE_PROVIDER_SITE_OTHER): Payer: Medicare HMO | Admitting: Family Medicine

## 2018-05-06 ENCOUNTER — Encounter (INDEPENDENT_AMBULATORY_CARE_PROVIDER_SITE_OTHER): Payer: Medicare HMO | Admitting: Family Medicine

## 2018-05-06 ENCOUNTER — Encounter (INDEPENDENT_AMBULATORY_CARE_PROVIDER_SITE_OTHER): Payer: Self-pay | Admitting: Family Medicine

## 2018-05-06 VITALS — BP 134/61 | HR 65 | Temp 98.2°F | Ht 62.0 in | Wt 214.0 lb

## 2018-05-06 DIAGNOSIS — R739 Hyperglycemia, unspecified: Secondary | ICD-10-CM

## 2018-05-06 DIAGNOSIS — Z1331 Encounter for screening for depression: Secondary | ICD-10-CM

## 2018-05-06 DIAGNOSIS — R5383 Other fatigue: Secondary | ICD-10-CM

## 2018-05-06 DIAGNOSIS — Z6839 Body mass index (BMI) 39.0-39.9, adult: Secondary | ICD-10-CM | POA: Diagnosis not present

## 2018-05-06 DIAGNOSIS — R0602 Shortness of breath: Secondary | ICD-10-CM

## 2018-05-06 DIAGNOSIS — Z0289 Encounter for other administrative examinations: Secondary | ICD-10-CM

## 2018-05-06 DIAGNOSIS — E559 Vitamin D deficiency, unspecified: Secondary | ICD-10-CM

## 2018-05-06 DIAGNOSIS — I2510 Atherosclerotic heart disease of native coronary artery without angina pectoris: Secondary | ICD-10-CM

## 2018-05-07 LAB — CBC WITH DIFFERENTIAL
BASOS ABS: 0 10*3/uL (ref 0.0–0.2)
Basos: 0 %
EOS (ABSOLUTE): 0.2 10*3/uL (ref 0.0–0.4)
EOS: 2 %
Hematocrit: 39.6 % (ref 34.0–46.6)
Hemoglobin: 13.1 g/dL (ref 11.1–15.9)
IMMATURE GRANULOCYTES: 0 %
Immature Grans (Abs): 0 10*3/uL (ref 0.0–0.1)
Lymphocytes Absolute: 2.1 10*3/uL (ref 0.7–3.1)
Lymphs: 22 %
MCH: 30.7 pg (ref 26.6–33.0)
MCHC: 33.1 g/dL (ref 31.5–35.7)
MCV: 93 fL (ref 79–97)
MONOS ABS: 0.6 10*3/uL (ref 0.1–0.9)
Monocytes: 7 %
NEUTROS PCT: 69 %
Neutrophils Absolute: 6.5 10*3/uL (ref 1.4–7.0)
RBC: 4.27 x10E6/uL (ref 3.77–5.28)
RDW: 13.1 % (ref 12.3–15.4)
WBC: 9.4 10*3/uL (ref 3.4–10.8)

## 2018-05-07 LAB — COMPREHENSIVE METABOLIC PANEL
ALBUMIN: 4.4 g/dL (ref 3.5–4.8)
ALK PHOS: 54 IU/L (ref 39–117)
ALT: 30 IU/L (ref 0–32)
AST: 36 IU/L (ref 0–40)
Albumin/Globulin Ratio: 1.5 (ref 1.2–2.2)
BILIRUBIN TOTAL: 0.5 mg/dL (ref 0.0–1.2)
BUN / CREAT RATIO: 17 (ref 12–28)
BUN: 20 mg/dL (ref 8–27)
CHLORIDE: 105 mmol/L (ref 96–106)
CO2: 23 mmol/L (ref 20–29)
Calcium: 9.9 mg/dL (ref 8.7–10.3)
Creatinine, Ser: 1.2 mg/dL — ABNORMAL HIGH (ref 0.57–1.00)
GFR calc Af Amer: 53 mL/min/{1.73_m2} — ABNORMAL LOW (ref >59)
GFR calc non Af Amer: 46 mL/min/{1.73_m2} — ABNORMAL LOW (ref >59)
GLUCOSE: 90 mg/dL (ref 65–99)
Globulin, Total: 2.9 g/dL (ref 1.5–4.5)
POTASSIUM: 5.6 mmol/L — AB (ref 3.5–5.2)
SODIUM: 142 mmol/L (ref 134–144)
Total Protein: 7.3 g/dL (ref 6.0–8.5)

## 2018-05-07 LAB — LIPID PANEL WITH LDL/HDL RATIO
Cholesterol, Total: 141 mg/dL (ref 100–199)
HDL: 50 mg/dL (ref >39)
LDL CALC: 77 mg/dL (ref 0–99)
LDL/HDL RATIO: 1.5 ratio (ref 0.0–3.2)
Triglycerides: 69 mg/dL (ref 0–149)
VLDL Cholesterol Cal: 14 mg/dL (ref 5–40)

## 2018-05-07 LAB — HEMOGLOBIN A1C
ESTIMATED AVERAGE GLUCOSE: 111 mg/dL
Hgb A1c MFr Bld: 5.5 % (ref 4.8–5.6)

## 2018-05-07 LAB — VITAMIN D 25 HYDROXY (VIT D DEFICIENCY, FRACTURES): VIT D 25 HYDROXY: 45.2 ng/mL (ref 30.0–100.0)

## 2018-05-07 LAB — INSULIN, RANDOM: INSULIN: 19.5 u[IU]/mL (ref 2.6–24.9)

## 2018-05-07 NOTE — Progress Notes (Signed)
Office: [phone]  /  Fax: [phone]   Dear Dr. Everardo All,   Thank you for referring Samantha Short to our clinic. The following note includes my evaluation and treatment recommendations.  HPI:   Chief Complaint: OBESITY    Samantha Short has been referred by Romero Belling, MD for consultation regarding her obesity and obesity related comorbidities.    Samantha Short (MR# [id number]) is a 72 y.o. female who presents on 05/07/2018 for obesity evaluation and treatment. Current BMI is Body mass index is 39.14 kg/m.Marland Kitchen Samantha Short has been struggling with her weight for many years and has been unsuccessful in either losing weight, maintaining weight loss, or reaching her healthy weight goal.     Samantha Short attended our information session and states she is currently in the action stage of change and ready to dedicate time achieving and maintaining a healthier weight. Samantha Short is interested in becoming our patient and working on intensive lifestyle modifications including (but not limited to) diet, exercise and weight loss.    Samantha Short states her family eats meals together she thinks her family will eat healthier with  her her desired weight loss is 52 lbs she has been heavy most of  her life she started gaining weight 2 yrs ago her heaviest weight ever was 212 lbs. she has significant food cravings issues  she is frequently drinking liquids with calories she has binge eating behaviors she struggles with emotional eating    Fatigue Sosie feels her energy is lower than it should be. This has worsened with weight gain and has not worsened recently. Samantha Short admits to daytime somnolence and admits to waking up still tired. Patient is at risk for obstructive sleep apnea. Patent has a history of symptoms of daytime fatigue and morning fatigue. Patient generally gets 6 hours of sleep per night, and states they generally have restless sleep. Snoring is present. Apneic episodes are not present. Epworth Sleepiness  Score is 6  EKG was ordered today and shows RBBB (right bundle branch block).  Dyspnea on exertion Samantha Short notes increasing shortness of breath with exercising and seems to be worsening over time with weight gain. She notes getting out of breath sooner with activity than she used to. This has not gotten worse recently. EKG was ordered today and shows RBBB (right bundle branch block). Lidiya admits orthopnea.  Coronary Artery Disease Samantha Short has a diagnosis of coronary artery disease and she sees cardiologist Dr. Clifton James. Britne states that she is due for her one year follow up and EKG.  Vitamin D deficiency Samantha Short has a likely diagnosis of vitamin D deficiency given obesity. Samantha Short is not currently taking vit D and she denies nausea, vomiting or muscle weakness.  Hyperglycemia Samantha Short has a history of two elevated blood glucose readings without a diagnosis of diabetes. She denies polyphagia.  Depression Screen Samantha Short Food and Mood (modified PHQ-9) score was  Depression screen PHQ 2/9 05/06/2018  Decreased Interest 3  Down, Depressed, Hopeless 1  PHQ - 2 Score 4  Altered sleeping 1  Tired, decreased energy 3  Change in appetite 3  Feeling bad or failure about yourself  1  Trouble concentrating 0  Moving slowly or fidgety/restless 1  Suicidal thoughts 0  PHQ-9 Score 13  Difficult doing work/chores Not difficult at all    ALLERGIES: Allergies  Allergen Reactions  . Ceclor [Cefaclor] Other (See Comments)    Lost vision in eye  . Hctz [Hydrochlorothiazide] Other (See Comments)    Renal insufficiency  .  Lipitor [Atorvastatin Calcium] Other (See Comments)    Increased A1C  . Norvasc [Amlodipine] Other (See Comments)    Makes patient stiff  . Penicillins Hives, Shortness Of Breath, Itching and Rash  . Sulfa Antibiotics Other (See Comments)    CAUSES SHOCK  . Statins Other (See Comments)    MYALGIAS AND WEAKNESS  . Erythromycin Rash  . Latex Rash  . Levofloxacin Rash  . Tetracyclines  & Related Rash    MEDICATIONS: Current Outpatient Medications on File Prior to Visit  Medication Sig Dispense Refill  . AMBULATORY NON FORMULARY MEDICATION Take 180 mg by mouth daily. Medication Name: bempedoic acid 180 mg vs placebo, study drug provided    . aspirin 81 MG tablet Take 81 mg by mouth daily.    . furosemide (LASIX) 20 MG tablet Take 1 tablet (20 mg total) by mouth daily as needed. 90 tablet 3  . lisinopril (PRINIVIL,ZESTRIL) 40 MG tablet TAKE 1 TABLET DAILY 90 tablet 3  . loratadine (CLARITIN) 10 MG tablet Take 10 mg by mouth daily.    . metoprolol tartrate (LOPRESSOR) 100 MG tablet Take 1 tablet (100 mg total) by mouth 2 (two) times daily. 180 tablet 3  . Multiple Vitamin (MULTIVITAMIN WITH MINERALS) TABS tablet Take 1 tablet by mouth daily.    . Multiple Vitamins-Minerals (OCUVITE ADULT 50+ PO) Take by mouth.     No current facility-administered medications on file prior to visit.     PAST MEDICAL HISTORY: Past Medical History:  Diagnosis Date  . Asthma   . Chronic diastolic CHF (congestive heart failure) (HCC)    a. 02/2011 Echo: EF 55-60%, Gr2 DD, Mild MR  . Coronary artery disease    a. s/p CABG x 1 2010:  LIMA->LAD.;  b. amdx for CP => LHC 07/04/12: LAD 70-80%, mid RCA 30%, LIMA-LAD patent with competitive flow limiting distal LAD filling, EF 70% with hyperdynamic LV function. Medical therapy continued.  . Dyslipidemia   . GERD (gastroesophageal reflux disease)   . Hyperlipidemia   . Hypertension   . Hyperthyroidism   . Mitral regurgitation    a. mild by echo 02/2011.    PAST SURGICAL HISTORY: Past Surgical History:  Procedure Laterality Date  . ABDOMINAL HYSTERECTOMY  1980  . CARDIAC CATHETERIZATION  07/27/09 & 07/28/09  . CESAREAN SECTION    . CESAREAN SECTION    . CORONARY ARTERY BYPASS GRAFT  08/03/2009   x1 using left internal mammary artery to distal left anterior  descending coronary artery.   Marland Kitchen GALLBLADDER SURGERY  2001  . LEFT HEART  CATHETERIZATION WITH CORONARY/GRAFT ANGIOGRAM N/A 07/05/2012   Procedure: LEFT HEART CATHETERIZATION WITH Isabel Caprice;  Surgeon: Wendall Stade, MD;  Location: [facility] CATH LAB;  Service: Cardiovascular;  Laterality: N/A;  . SHOULDER SURGERY  1998 / 2001   from accident  . VESICOVAGINAL FISTULA CLOSURE W/ TAH  1998    SOCIAL HISTORY: Social History   Tobacco Use  . Smoking status: Former Smoker    Years: 7.00    Types: Cigarettes    Last attempt to quit: 07/12/2010    Years since quitting: 7.8  . Smokeless tobacco: Never Used  Substance Use Topics  . Alcohol use: No  . Drug use: No    FAMILY HISTORY: Family History  Problem Relation Age of Onset  . Cancer Father   . Heart attack Father   . High blood pressure Father   . High Cholesterol Father   . Heart disease Father   .  Alcoholism Father   . Obesity Father   . Heart attack Mother   . Heart disease Mother        had pacemaker  . High blood pressure Mother   . High Cholesterol Mother   . Obesity Mother   . Cancer Brother   . Heart disease Brother   . Heart disease Sister   . Cancer Sister   . Angina Sister   . Coronary artery disease Son   . Hypertension Sister   . Hypertension Brother   . Thyroid disease Neg Hx     ROS: Review of Systems  Constitutional: Positive for malaise/fatigue.       Fever/Chills  HENT: Positive for congestion (nasal stuffiness) and hearing loss.        Hoarseness  Eyes:       Wear Glasses or Contacts Floaters  Respiratory: Positive for shortness of breath and wheezing.   Cardiovascular: Positive for orthopnea.       Positive for Shortness of Breath on exertion Leg Cramping Very Cold Hands or Feet   Gastrointestinal: Positive for heartburn. Negative for nausea and vomiting.  Musculoskeletal:       Neck Stiffness Muscle Stiffness Negative for muscle weakness  Endo/Heme/Allergies: Bruises/bleeds easily.       Negative for polyphagia  Psychiatric/Behavioral: The  patient has insomnia.     PHYSICAL EXAM: Blood pressure 134/61, pulse 65, temperature 98.2 F (36.8 C), temperature source Oral, height 5\' 2"  (1.575 m), weight 214 lb (97.1 kg), SpO2 98 %. Body mass index is 39.14 kg/m. Physical Exam  Constitutional: She is oriented to person, place, and time. She appears well-developed and well-nourished.  HENT:  Head: Normocephalic and atraumatic.  Nose: Nose normal.  Eyes: EOM are normal. No scleral icterus.  Neck: Normal range of motion. Neck supple. No thyromegaly present.  Cardiovascular: Normal rate.  Positive for Right Bundle Branch Block  Pulmonary/Chest: Effort normal. No respiratory distress.  Abdominal: Soft. There is no tenderness.  + obesity  Musculoskeletal: Normal range of motion.  Range of Motion normal in all 4 extremities  Neurological: She is alert and oriented to person, place, and time. Coordination normal.  Skin: Skin is warm and dry.  Psychiatric: She has a normal mood and affect. Her behavior is normal.  Vitals reviewed.   RECENT LABS AND TESTS: BMET    Component Value Date/Time   NA 139 06/28/2016 0930   K 4.7 06/28/2016 0930   CL 109 06/28/2016 0930   CO2 24 06/28/2016 0930   GLUCOSE 88 06/28/2016 0930   BUN 16 06/28/2016 0930   CREATININE 1.13 (H) 06/28/2016 0930   CALCIUM 9.5 06/28/2016 0930   GFRNONAA 44 (L) 01/13/2015 1519   GFRAA 52 (L) 01/13/2015 1519   Lab Results  Component Value Date   HGBA1C  07/29/2009    5.8 (NOTE) The ADA recommends the following therapeutic goal for glycemic control related to Hgb A1c measurement: Goal of therapy: <6.5 Hgb A1c  Reference: American Diabetes Association: Clinical Practice Recommendations 2010, Diabetes Care, 2010, 33: (Suppl  1).   No results found for: INSULIN CBC    Component Value Date/Time   WBC 9.9 01/13/2015 1519   RBC 4.24 01/13/2015 1519   HGB 12.5 01/13/2015 1519   HCT 37.8 01/13/2015 1519   PLT 242 01/13/2015 1519   MCV 89.2 01/13/2015 1519    MCH 29.5 01/13/2015 1519   MCHC 33.1 01/13/2015 1519   RDW 13.3 01/13/2015 1519   LYMPHSABS 2.3 01/13/2015 1519  MONOABS 0.5 01/13/2015 1519   EOSABS 0.1 01/13/2015 1519   BASOSABS 0.0 01/13/2015 1519   Iron/TIBC/Ferritin/ %Sat No results found for: IRON, TIBC, FERRITIN, IRONPCTSAT Lipid Panel     Component Value Date/Time   CHOL 194 08/07/2017 1003   TRIG 81 08/07/2017 1003   HDL 63 08/07/2017 1003   CHOLHDL 3.1 08/07/2017 1003   CHOLHDL 3.3 06/28/2016 0930   VLDL 15 06/28/2016 0930   LDLCALC 115 (H) 08/07/2017 1003   Hepatic Function Panel     Component Value Date/Time   PROT 7.3 01/13/2015 1519   ALBUMIN 3.8 01/13/2015 1519   AST 28 01/13/2015 1519   ALT 22 01/13/2015 1519   ALKPHOS 73 01/13/2015 1519   BILITOT 1.0 01/13/2015 1519   BILIDIR 0.1 06/04/2014 0944      Component Value Date/Time   TSH 1.13 04/26/2018 1121   TSH 1.11 03/13/2018 1136   TSH 0.26 (L) 02/08/2018 1136    ECG  shows NSR with a rate of 64 BPM INDIRECT CALORIMETER done today shows a VO2 of 188 and a REE of 1312.  Her calculated basal metabolic rate is 1610 thus her basal metabolic rate is worse than expected.    ASSESSMENT AND PLAN: Other fatigue - Plan: EKG 12-Lead, Comprehensive metabolic panel, CBC With Differential  Shortness of breath on exertion - Plan: CBC With Differential  Coronary artery disease involving native coronary artery of native heart without angina pectoris - Plan: Lipid Panel With LDL/HDL Ratio  Vitamin D deficiency - Plan: VITAMIN D 25 Hydroxy (Vit-D Deficiency, Fractures)  Hyperglycemia - Plan: Hemoglobin A1c, Insulin, random  Depression screening  Class 2 severe obesity with serious comorbidity and body mass index (BMI) of 39.0 to 39.9 in adult, unspecified obesity type (HCC)  PLAN: Fatigue Samantha Short was informed that her fatigue may be related to obesity, depression or many other causes. Labs will be ordered, and in the meanwhile Laylee has agreed to work on  diet, exercise and weight loss to help with fatigue. Proper sleep hygiene was discussed including the need for 7-8 hours of quality sleep each night. A sleep study was not ordered based on symptoms and Epworth score. We will order EKG and indirect calorimetry today.  Dyspnea on exertion Samantha Short shortness of breath appears to be obesity related and exercise induced. She has agreed to work on weight loss and gradually increase exercise to treat her exercise induced shortness of breath. If Icesis follows our instructions and loses weight without improvement of her shortness of breath, we will plan to refer to pulmonology. We will order labs, EKG and indirect calorimetry today and we will monitor this condition regularly. Kieara agrees to this plan.  Coronary Artery Disease We will order EKG and Samantha Short will follow up with cardiology. We will check fasting lipid panel and CMP today. Samantha Short agrees to follow up with our clinic in 2 weeks.  Vitamin D Deficiency Samantha Short was informed that low vitamin D levels contributes to fatigue and are associated with obesity, breast, and colon cancer. We will check vitamin D level today and results will be discussed with Samantha Short in 2 weeks at her follow up visit. She  will follow up for routine testing of vitamin D, at least 2-3 times per year.   Hyperglycemia Fasting labs will be obtained and results with be discussed with Samantha Short in 2 weeks at her follow up visit. In the meanwhile Samantha Short was started on a lower simple carbohydrate diet and will work on weight loss efforts.  Depression Screen Samantha Short had a moderately positive depression screening. Depression is commonly associated with obesity and often results in emotional eating behaviors. We will monitor this closely and work on CBT to help improve the non-hunger eating patterns. Referral to Psychology may be required if no improvement is seen as she continues in our clinic.  Obesity Samantha Short is currently in the action stage of  change and her goal is to continue with weight loss efforts. I recommend Samantha Short begin the structured treatment plan as follows:  She has agreed to follow the Category 2 plan Samantha Short has been instructed to eventually work up to a goal of 150 minutes of combined cardio and strengthening exercise per week for weight loss and overall health benefits. We discussed the following Behavioral Modification Strategies today: better snacking choices, planning for success, increasing lean protein intake, increasing vegetables and work on meal planning and easy cooking plans   She was informed of the importance of frequent follow up visits to maximize her success with intensive lifestyle modifications for her multiple health conditions. She was informed we would discuss her lab results at her next visit unless there is a critical issue that needs to be addressed sooner. Sukhman agreed to keep her next visit at the agreed upon time to discuss these results.    OBESITY BEHAVIORAL INTERVENTION VISIT  Today's visit was # 1   Starting weight: 214 lbs Starting date: 05/06/18 Today's weight : 214 lbs  Today's date: 05/06/2018 Total lbs lost to date: 0 At least 15 minutes were spent on discussing the following behavioral intervention visit.   ASK: We discussed the diagnosis of obesity with Toneisha R Kervin today and Satia agreed to give us permission to discuss obesity behavioral modification therapy today.  ASSESS: Marsi has the diagnosis of obesity and her BMI today is 39.13 Najmo is in the action stage of change   ADVISE: Nikelle was educated on the multiple health risks of obesity as well as the benefit of weight loss to improve her health. She was advised of the need for long term treatment and the importance of lifestyle modifications to improve her current health and to decrease her risk of future health problems.  AGREE: Multiple dietary modification options and treatment options were discussed and  Meagon  agreed to follow the recommendations documented in the above note.  ARRANGE: Courtnie was educated on the importance of frequent visits to treat obesity as outlined per CMS and USPSTF guidelines and agreed to schedule her next follow up appointment today.  I, [place] CraneJoanne Murray, am acting as Energy managertranscriptionist for Filbert SchilderAlexandria U. Kadolph  I have reviewed the above documentation for accuracy and completeness, and I agree with the above. - Debbra RidingAlexandria Kadolph, MD

## 2018-05-14 DIAGNOSIS — R5383 Other fatigue: Secondary | ICD-10-CM | POA: Diagnosis not present

## 2018-05-14 DIAGNOSIS — R339 Retention of urine, unspecified: Secondary | ICD-10-CM | POA: Diagnosis not present

## 2018-05-14 DIAGNOSIS — R300 Dysuria: Secondary | ICD-10-CM | POA: Diagnosis not present

## 2018-05-21 ENCOUNTER — Ambulatory Visit (INDEPENDENT_AMBULATORY_CARE_PROVIDER_SITE_OTHER): Payer: Medicare HMO | Admitting: Family Medicine

## 2018-05-21 VITALS — BP 131/81 | HR 65 | Temp 97.9°F | Ht 62.0 in | Wt 211.0 lb

## 2018-05-21 DIAGNOSIS — E875 Hyperkalemia: Secondary | ICD-10-CM

## 2018-05-21 DIAGNOSIS — E559 Vitamin D deficiency, unspecified: Secondary | ICD-10-CM

## 2018-05-21 DIAGNOSIS — I10 Essential (primary) hypertension: Secondary | ICD-10-CM | POA: Diagnosis not present

## 2018-05-21 DIAGNOSIS — E8881 Metabolic syndrome: Secondary | ICD-10-CM

## 2018-05-21 DIAGNOSIS — Z6838 Body mass index (BMI) 38.0-38.9, adult: Secondary | ICD-10-CM | POA: Diagnosis not present

## 2018-05-21 MED ORDER — VITAMIN D (ERGOCALCIFEROL) 1.25 MG (50000 UNIT) PO CAPS: 50000.0000 [IU] | ORAL_CAPSULE | ORAL | 0 refills | Status: DC

## 2018-05-22 DIAGNOSIS — E875 Hyperkalemia: Secondary | ICD-10-CM | POA: Diagnosis not present

## 2018-05-22 NOTE — Progress Notes (Signed)
Office: [phone]  /  Fax: [phone]   HPI:   Chief Complaint: OBESITY Samantha Short Short here to discuss her progress with her obesity treatment plan. She Short on the Category 2 plan and Short following her eating plan approximately 90 % of the time. She states she Short exercising 0 minutes 0 times per week. Samantha Short went out twice. She struggled with sandwiches and found this was a lot of food.  Her weight Short 211 lb (95.7 kg) today and has had a weight loss of 3 pounds over a period of 2 weeks since her last visit. She has lost 3 lbs since starting treatment with [place].  Hypertension Samantha Short Short a 72 y.o. female with hypertension. Samantha Short's blood pressure Short controlled today. She denies chest pain, chest pressure, or headache. She Short working weight loss to help control her blood pressure with the goal of decreasing her risk of heart attack and stroke.   Vitamin D Deficiency Samantha Short has a diagnosis of vitamin D deficiency. She Short currently taking prescription Vit D. She notes fatigue and denies nausea, vomiting or muscle weakness.  Insulin Resistance Samantha Short has a diagnosis of insulin resistance based on her elevated fasting insulin level >5. Although Samantha Short blood glucose readings are still under good control, insulin resistance puts her at greater risk of metabolic syndrome and diabetes. She Short not taking metformin currently, she denies carbohydrate cravings and continues to work on diet and exercise to decrease risk of diabetes.  Hyperkalemia Samantha Short potassium was elevated on last labs. She Short on Lasix, and she notes recent urinary tract infection.  ALLERGIES: Allergies  Allergen Reactions  . Ceclor [Cefaclor] Other (See Comments)    Lost vision in eye  . Hctz [Hydrochlorothiazide] Other (See Comments)    Renal insufficiency  . Lipitor [Atorvastatin Calcium] Other (See Comments)    Increased A1C  . Norvasc [Amlodipine] Other (See Comments)    Makes patient stiff  . Penicillins Hives,  Shortness Of Breath, Itching and Rash  . Sulfa Antibiotics Other (See Comments)    CAUSES SHOCK  . Statins Other (See Comments)    MYALGIAS AND WEAKNESS  . Erythromycin Rash  . Latex Rash  . Levofloxacin Rash  . Tetracyclines & Related Rash    MEDICATIONS: Current Outpatient Medications on File Prior to Visit  Medication Sig Dispense Refill  . AMBULATORY NON FORMULARY MEDICATION Take 180 mg by mouth daily. Medication Name: bempedoic acid 180 mg vs placebo, study drug provided    . aspirin 81 MG tablet Take 81 mg by mouth daily.    Marland Kitchen lisinopril (PRINIVIL,ZESTRIL) 40 MG tablet TAKE 1 TABLET DAILY 90 tablet 3  . loratadine (CLARITIN) 10 MG tablet Take 10 mg by mouth daily.    . metoprolol tartrate (LOPRESSOR) 100 MG tablet Take 1 tablet (100 mg total) by mouth 2 (two) times daily. 180 tablet 3  . Multiple Vitamin (MULTIVITAMIN WITH MINERALS) TABS tablet Take 1 tablet by mouth daily.    . Multiple Vitamins-Minerals (OCUVITE ADULT 50+ PO) Take by mouth.    . furosemide (LASIX) 20 MG tablet Take 1 tablet (20 mg total) by mouth daily as needed. 90 tablet 3   No current facility-administered medications on file prior to visit.     PAST MEDICAL HISTORY: Past Medical History:  Diagnosis Date  . Asthma   . Chronic diastolic CHF (congestive heart failure) (HCC)    a. 02/2011 Echo: EF 55-60%, Gr2 DD, Mild MR  . Coronary artery disease  a. s/p CABG x 1 2010:  LIMA->LAD.;  b. amdx for CP => LHC 07/04/12: LAD 70-80%, mid RCA 30%, LIMA-LAD patent with competitive flow limiting distal LAD filling, EF 70% with hyperdynamic LV function. Medical therapy continued.  . Dyslipidemia   . GERD (gastroesophageal reflux disease)   . Hyperlipidemia   . Hypertension   . Hyperthyroidism   . Mitral regurgitation    a. mild by echo 02/2011.    PAST SURGICAL HISTORY: Past Surgical History:  Procedure Laterality Date  . ABDOMINAL HYSTERECTOMY  1980  . CARDIAC CATHETERIZATION  07/27/09 & 07/28/09  .  CESAREAN SECTION    . CESAREAN SECTION    . CORONARY ARTERY BYPASS GRAFT  08/03/2009   x1 using left internal mammary artery to distal left anterior  descending coronary artery.   Marland Kitchen GALLBLADDER SURGERY  2001  . LEFT HEART CATHETERIZATION WITH CORONARY/GRAFT ANGIOGRAM N/A 07/05/2012   Procedure: LEFT HEART CATHETERIZATION WITH Isabel Caprice;  Surgeon: Wendall Stade, MD;  Location: [facility] C[facility] LAB;  Service: Cardiovascular;  Laterality: N/A;  . SHOULDER SURGERY  1998 / 2001   from accident  . VESICOVAGINAL FISTULA CLOSURE W/ TAH  1998    SOCIAL HISTORY: Social History   Tobacco Use  . Smoking status: Former Smoker    Years: 7.00    Types: Cigarettes    Last attempt to quit: 07/12/2010    Years since quitting: 7.8  . Smokeless tobacco: Never Used  Substance Use Topics  . Alcohol use: No  . Drug use: No    FAMILY HISTORY: Family History  Problem Relation Age of Onset  . Cancer Father   . Heart attack Father   . High blood pressure Father   . High Cholesterol Father   . Heart disease Father   . Alcoholism Father   . Obesity Father   . Heart attack Mother   . Heart disease Mother        had pacemaker  . High blood pressure Mother   . High Cholesterol Mother   . Obesity Mother   . Cancer Brother   . Heart disease Brother   . Heart disease Sister   . Cancer Sister   . Angina Sister   . Coronary artery disease Son   . Hypertension Sister   . Hypertension Brother   . Thyroid disease Neg Hx     ROS: Review of Systems  Constitutional: Positive for malaise/fatigue and weight loss.  Cardiovascular: Negative for chest pain.       Negative chest pressure  Gastrointestinal: Negative for nausea and vomiting.  Musculoskeletal:       Negative muscle weakness  Neurological: Negative for headaches.    PHYSICAL EXAM: Blood pressure 131/81, pulse 65, temperature 97.9 F (36.6 C), temperature source Oral, height 5\' 2"  (1.575 Short), weight 211 lb (95.7 kg), SpO2 97  %. Body mass index Short 38.59 kg/Short. Physical Exam  Constitutional: She Short oriented to person, place, and time. She appears well-developed and well-nourished.  Cardiovascular: Normal rate.  Pulmonary/Chest: Effort normal.  Musculoskeletal: Normal range of motion.  Neurological: She Short oriented to person, place, and time.  Skin: Skin Short warm and dry.  Psychiatric: She has a normal mood and affect. Her behavior Short normal.  Vitals reviewed.   RECENT LABS AND TESTS: BMET    Component Value Date/Time   NA 142 05/06/2018 1203   K 5.6 (H) 05/06/2018 1203   CL 105 05/06/2018 1203   CO2 23 05/06/2018 1203  GLUCOSE 90 05/06/2018 1203   GLUCOSE 88 06/28/2016 0930   BUN 20 05/06/2018 1203   CREATININE 1.20 (H) 05/06/2018 1203   CREATININE 1.13 (H) 06/28/2016 0930   CALCIUM 9.9 05/06/2018 1203   GFRNONAA 46 (L) 05/06/2018 1203   GFRAA 53 (L) 05/06/2018 1203   Lab Results  Component Value Date   HGBA1C 5.5 05/06/2018   HGBA1C  07/29/2009    5.8 (NOTE) The ADA recommends the following therapeutic goal for glycemic control related to Hgb A1c measurement: Goal of therapy: <6.5 Hgb A1c  Reference: American Diabetes Association: Clinical Practice Recommendations 2010, Diabetes Care, 2010, 33: (Suppl  1).   Lab Results  Component Value Date   INSULIN 19.5 05/06/2018   CBC    Component Value Date/Time   WBC 9.4 05/06/2018 1203   WBC 9.9 01/13/2015 1519   RBC 4.27 05/06/2018 1203   RBC 4.24 01/13/2015 1519   HGB 13.1 05/06/2018 1203   HCT 39.6 05/06/2018 1203   PLT 242 01/13/2015 1519   MCV 93 05/06/2018 1203   MCH 30.7 05/06/2018 1203   MCH 29.5 01/13/2015 1519   MCHC 33.1 05/06/2018 1203   MCHC 33.1 01/13/2015 1519   RDW 13.1 05/06/2018 1203   LYMPHSABS 2.1 05/06/2018 1203   MONOABS 0.5 01/13/2015 1519   EOSABS 0.2 05/06/2018 1203   BASOSABS 0.0 05/06/2018 1203   Iron/TIBC/Ferritin/ %Sat No results found for: IRON, TIBC, FERRITIN, IRONPCTSAT Lipid Panel     Component  Value Date/Time   CHOL 141 05/06/2018 1203   TRIG 69 05/06/2018 1203   HDL 50 05/06/2018 1203   CHOLHDL 3.1 08/07/2017 1003   CHOLHDL 3.3 06/28/2016 0930   VLDL 15 06/28/2016 0930   LDLCALC 77 05/06/2018 1203   Hepatic Function Panel     Component Value Date/Time   PROT 7.3 05/06/2018 1203   ALBUMIN 4.4 05/06/2018 1203   AST 36 05/06/2018 1203   ALT 30 05/06/2018 1203   ALKPHOS 54 05/06/2018 1203   BILITOT 0.5 05/06/2018 1203   BILIDIR 0.1 06/04/2014 0944      Component Value Date/Time   TSH 1.13 04/26/2018 1121   TSH 1.11 03/13/2018 1136   TSH 0.26 (L) 02/08/2018 1136  Results for LEMON, WHITACRE (MRN [id number]) as of 05/22/2018 15:55  Ref. Range 05/06/2018 12:03  Vitamin D, 25-Hydroxy Latest Ref Range: 30.0 - 100.0 ng/mL 45.2    ASSESSMENT AND PLAN: Essential hypertension  Vitamin D deficiency - Plan: Vitamin D, Ergocalciferol, (DRISDOL) 50000 units CAPS capsule  Insulin resistance  Hyperkalemia - Plan: Potassium  Class 2 severe obesity with serious comorbidity and body mass index (BMI) of 38.0 to 38.9 in adult, unspecified obesity type (HCC)  PLAN:  Hypertension We discussed sodium restriction, working on healthy weight loss, and a regular exercise program as the means to achieve improved blood pressure control. Samantha Short agreed with this plan and agreed to follow up as directed. We will continue to monitor her blood pressure as well as her progress with the above lifestyle modifications. Samantha Short agrees to continue her current medications and will watch for signs of hypotension as she continues her lifestyle modifications. Samantha Short agrees to follow up with our clinic in 2 weeks.  Vitamin D Deficiency Samantha Short was informed that low vitamin D levels contributes to fatigue and are associated with obesity, breast, and colon cancer. Samantha Short agrees to start prescription Vit D @50 ,000 IU every week #4 with no refills. She will follow up for routine testing of vitamin D, at least  2-3 times  per year. She was informed of the risk of over-replacement of vitamin D and agrees to not increase her dose unless she discusses this with [place] first. Marbella agrees to follow up with our clinic in 2 weeks.  Insulin Resistance Samantha Short will continue to work on weight loss, exercise, and decreasing simple carbohydrates in her diet to help decrease the risk of diabetes. We dicussed metformin including benefits and risks. She was informed that eating too many simple carbohydrates or too many calories at one sitting increases the likelihood of GI side effects. Samantha Short declined metformin for now and prescription was not written today. We will recheck labs in 3 months. Samantha Short agrees to follow up with our clinic in 2 weeks as directed to monitor her progress.  Hyperkalemia We will repeat potassium level today. Samantha Short agrees to follow up with our clinic in 2 weeks.  Obesity Samantha Short currently in the action stage of change. As such, her goal Short to continue with weight loss efforts She has agreed to follow the Category 2 plan Samantha Short to work up to a goal of 150 minutes of combined cardio and strengthening exercise per week for weight loss and overall health benefits. We discussed the following Behavioral Modification Strategies today: increasing lean protein intake, increasing vegetables, decrease eating out, work on meal planning and easy cooking plans, and planning for success   Samantha Short has agreed to follow up with our clinic in 2 weeks. She was informed of the importance of frequent follow up visits to maximize her success with intensive lifestyle modifications for her multiple health conditions.   OBESITY BEHAVIORAL INTERVENTION VISIT  Today's visit was # 2   Starting weight: 214 lbs Starting date: 05/06/18 Today's weight : 211 lbs  Today's date: 05/21/2018 Total lbs lost to date: 3 At least 15 minutes were spent on discussing the following behavioral intervention visit.   Samantha Short: We  discussed the diagnosis of obesity with Samantha R Muldrew today and Samantha Short agreed to give [place] permission to discuss obesity behavioral modification therapy today.  ASSESS: Diego has the diagnosis of obesity and her BMI today Short 38.58 Vollie Short in the action stage of change   ADVISE: Diannah was educated on the multiple health risks of obesity as well as the benefit of weight loss to improve her health. She was advised of the need for long term treatment and the importance of lifestyle modifications to improve her current health and to decrease her risk of future health problems.  AGREE: Multiple dietary modification options and treatment options were discussed and  Gesselle agreed to follow the recommendations documented in the above note.  ARRANGE: Robyn was educated on the importance of frequent visits to treat obesity as outlined per CMS and USPSTF guidelines and agreed to schedule her next follow up appointment today.  I, Burt Knack, am acting as transcriptionist for Debbra Riding, MD  I have reviewed the above documentation for accuracy and completeness, and I agree with the above. - Debbra Riding, MD

## 2018-05-23 ENCOUNTER — Encounter (HOSPITAL_COMMUNITY): Payer: Self-pay | Admitting: Emergency Medicine

## 2018-05-23 ENCOUNTER — Emergency Department (HOSPITAL_COMMUNITY)
Admission: EM | Admit: 2018-05-23 | Discharge: 2018-05-23 | Disposition: A | Discharge status: Home / Self Care | Payer: Medicare HMO | Attending: Emergency Medicine | Admitting: Emergency Medicine

## 2018-05-23 ENCOUNTER — Other Ambulatory Visit: Payer: Self-pay

## 2018-05-23 ENCOUNTER — Telehealth (INDEPENDENT_AMBULATORY_CARE_PROVIDER_SITE_OTHER): Payer: Self-pay

## 2018-05-23 ENCOUNTER — Emergency Department (HOSPITAL_COMMUNITY): Payer: Medicare HMO

## 2018-05-23 ENCOUNTER — Telehealth: Payer: Self-pay | Admitting: Cardiovascular Disease

## 2018-05-23 ENCOUNTER — Telehealth (INDEPENDENT_AMBULATORY_CARE_PROVIDER_SITE_OTHER): Payer: Self-pay | Admitting: Family Medicine

## 2018-05-23 DIAGNOSIS — Z951 Presence of aortocoronary bypass graft: Secondary | ICD-10-CM | POA: Insufficient documentation

## 2018-05-23 DIAGNOSIS — Z91040 Latex allergy status: Secondary | ICD-10-CM | POA: Insufficient documentation

## 2018-05-23 DIAGNOSIS — N390 Urinary tract infection, site not specified: Secondary | ICD-10-CM

## 2018-05-23 DIAGNOSIS — Z79899 Other long term (current) drug therapy: Secondary | ICD-10-CM | POA: Diagnosis not present

## 2018-05-23 DIAGNOSIS — R799 Abnormal finding of blood chemistry, unspecified: Secondary | ICD-10-CM | POA: Diagnosis present

## 2018-05-23 DIAGNOSIS — I2510 Atherosclerotic heart disease of native coronary artery without angina pectoris: Secondary | ICD-10-CM | POA: Diagnosis not present

## 2018-05-23 DIAGNOSIS — R0989 Other specified symptoms and signs involving the circulatory and respiratory systems: Secondary | ICD-10-CM | POA: Diagnosis not present

## 2018-05-23 DIAGNOSIS — I110 Hypertensive heart disease with heart failure: Secondary | ICD-10-CM | POA: Insufficient documentation

## 2018-05-23 DIAGNOSIS — E875 Hyperkalemia: Secondary | ICD-10-CM | POA: Diagnosis not present

## 2018-05-23 DIAGNOSIS — Z7982 Long term (current) use of aspirin: Secondary | ICD-10-CM | POA: Diagnosis not present

## 2018-05-23 DIAGNOSIS — N289 Disorder of kidney and ureter, unspecified: Secondary | ICD-10-CM | POA: Insufficient documentation

## 2018-05-23 DIAGNOSIS — J45909 Unspecified asthma, uncomplicated: Secondary | ICD-10-CM | POA: Diagnosis not present

## 2018-05-23 DIAGNOSIS — I5032 Chronic diastolic (congestive) heart failure: Secondary | ICD-10-CM | POA: Insufficient documentation

## 2018-05-23 DIAGNOSIS — Z87891 Personal history of nicotine dependence: Secondary | ICD-10-CM | POA: Diagnosis not present

## 2018-05-23 DIAGNOSIS — I4510 Unspecified right bundle-branch block: Secondary | ICD-10-CM | POA: Diagnosis not present

## 2018-05-23 LAB — CBC
HEMATOCRIT: 39.1 % (ref 36.0–46.0)
Hemoglobin: 12.4 g/dL (ref 12.0–15.0)
MCH: 30.5 pg (ref 26.0–34.0)
MCHC: 31.7 g/dL (ref 30.0–36.0)
MCV: 96.3 fL (ref 78.0–100.0)
Platelets: 235 10*3/uL (ref 150–400)
RBC: 4.06 MIL/uL (ref 3.87–5.11)
RDW: 12.4 % (ref 11.5–15.5)
WBC: 8.6 10*3/uL (ref 4.0–10.5)

## 2018-05-23 LAB — URINALYSIS, ROUTINE W REFLEX MICROSCOPIC
Bilirubin Urine: NEGATIVE
GLUCOSE, UA: NEGATIVE mg/dL
Hgb urine dipstick: NEGATIVE
KETONES UR: NEGATIVE mg/dL
Nitrite: NEGATIVE
PROTEIN: NEGATIVE mg/dL
Specific Gravity, Urine: 1.014 (ref 1.005–1.030)
pH: 5 (ref 5.0–8.0)

## 2018-05-23 LAB — BASIC METABOLIC PANEL
Anion gap: 10 (ref 5–15)
BUN: 37 mg/dL — ABNORMAL HIGH (ref 8–23)
CHLORIDE: 108 mmol/L (ref 98–111)
CO2: 22 mmol/L (ref 22–32)
CREATININE: 1.57 mg/dL — AB (ref 0.44–1.00)
Calcium: 9.8 mg/dL (ref 8.9–10.3)
GFR calc Af Amer: 37 mL/min — ABNORMAL LOW (ref >60)
GFR calc non Af Amer: 32 mL/min — ABNORMAL LOW (ref >60)
GLUCOSE: 112 mg/dL — AB (ref 70–99)
POTASSIUM: 4.7 mmol/L (ref 3.5–5.1)
Sodium: 140 mmol/L (ref 135–145)

## 2018-05-23 LAB — POTASSIUM: Potassium: 8.3 mmol/L (ref 3.5–5.2)

## 2018-05-23 LAB — I-STAT TROPONIN, ED: Troponin i, poc: 0.02 ng/mL (ref 0.00–0.08)

## 2018-05-23 IMAGING — CR DG CHEST 2V
2 series · 2 of 2 positions shown · non-contrast
Comparison: Radiographs January 13, 2015.

CLINICAL DATA: Cough, fever.

EXAM:
CHEST  2 VIEW

[w chest pa]
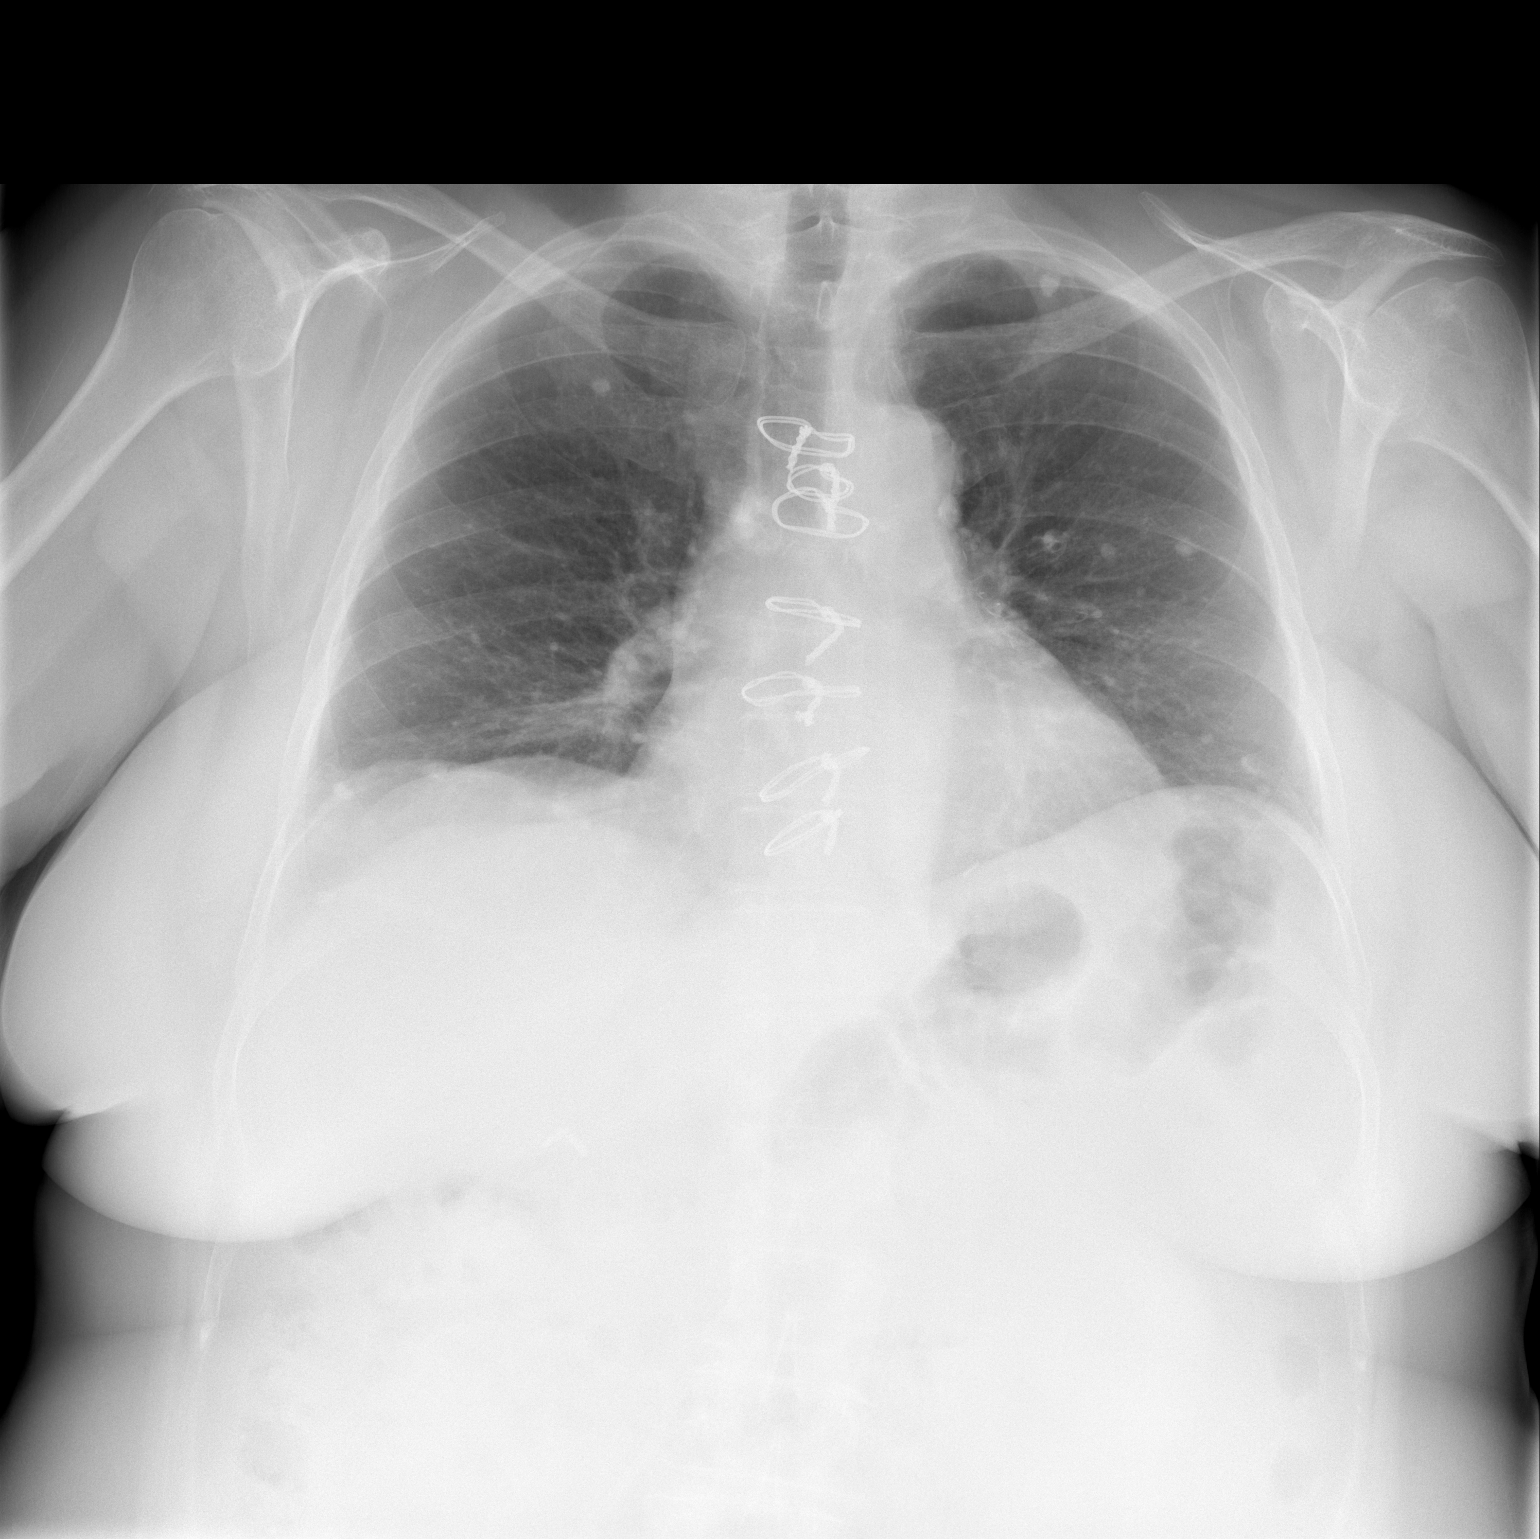

[w chest lat]
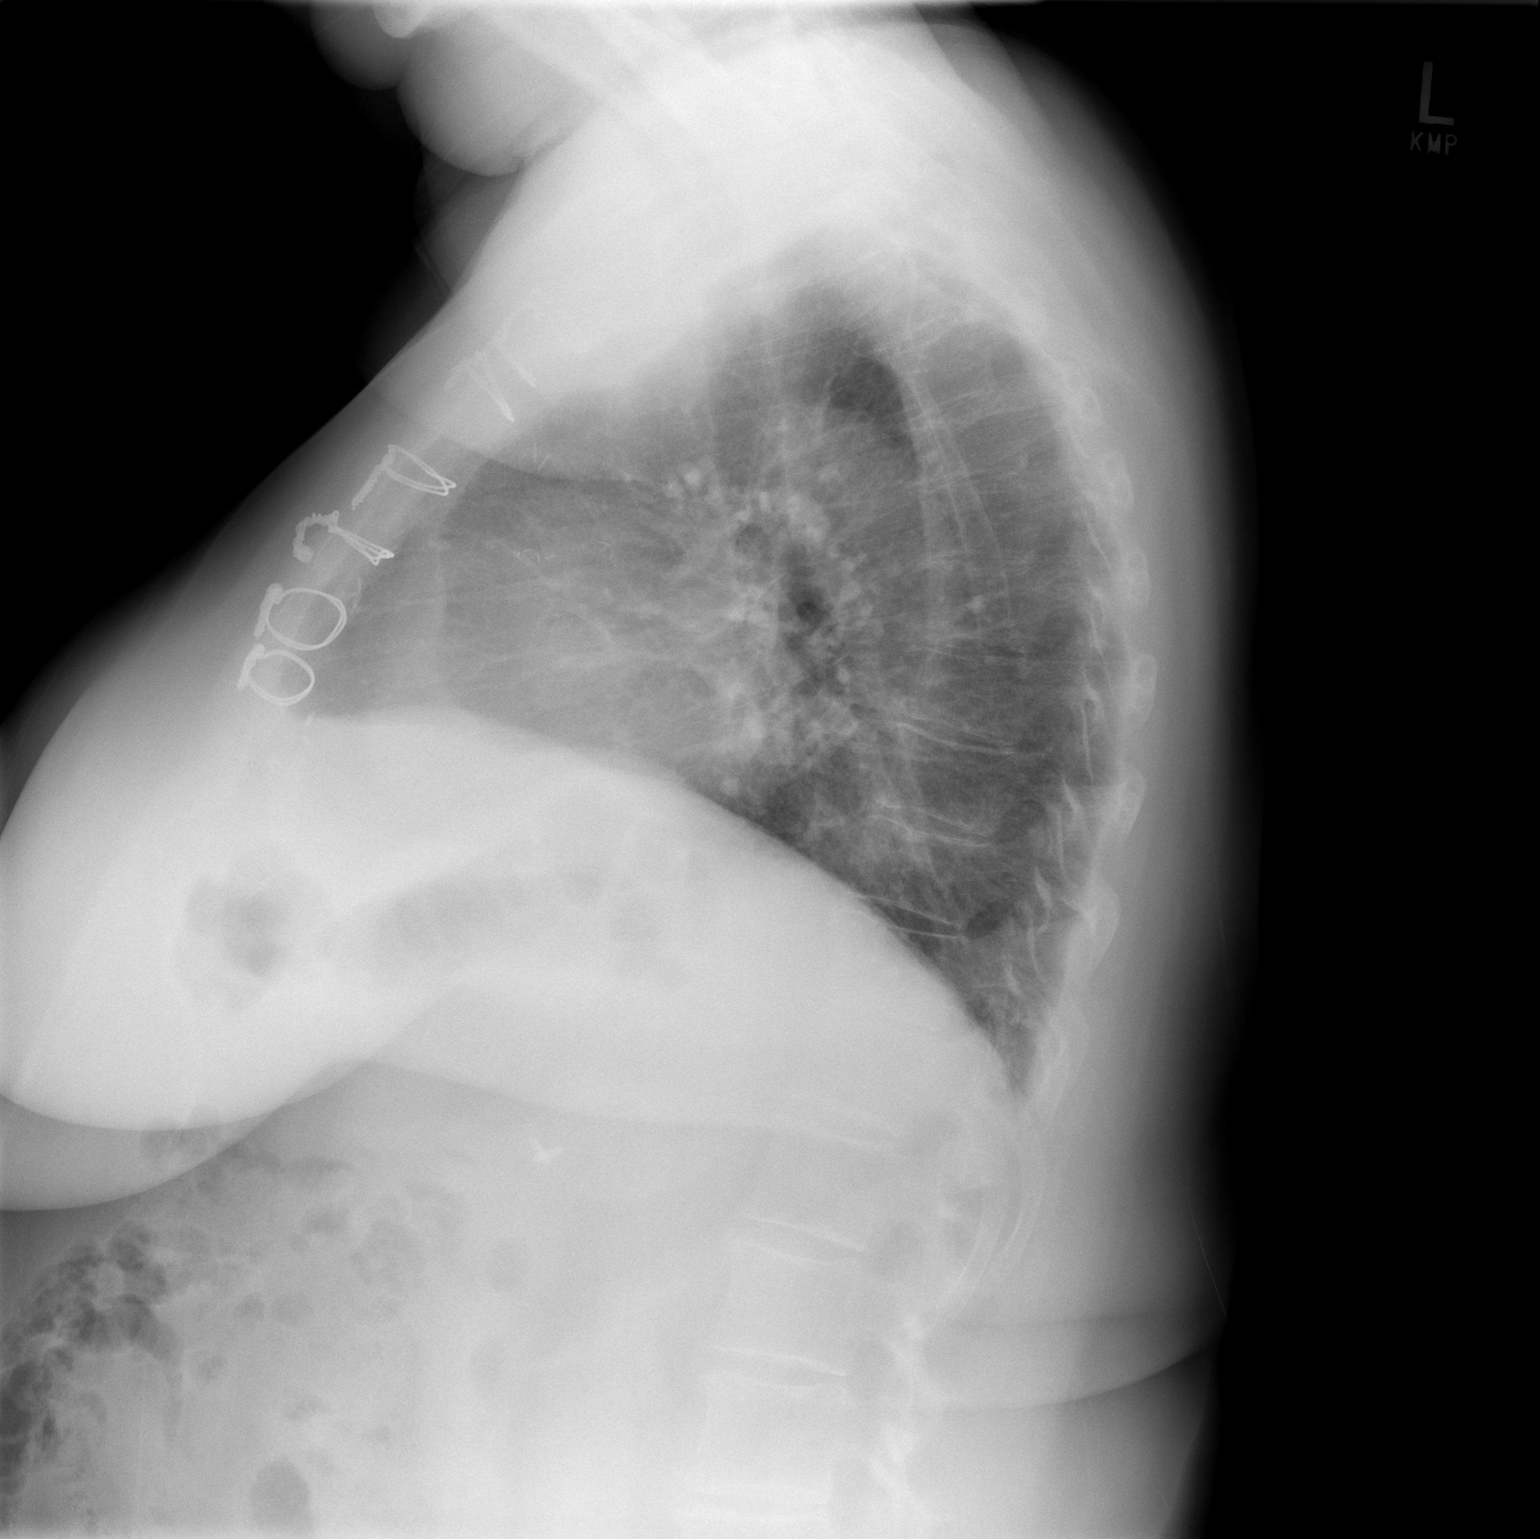

[2 of 2 positions shown; findings below may reference images not displayed]

FINDINGS: The heart size and mediastinal contours are within normal limits.
Sternotomy wires are noted. Stable bilateral calcified granulomata.
No pneumothorax or pleural effusion is noted. No acute pulmonary
disease is noted. The visualized skeletal structures are
unremarkable.
IMPRESSION: No active cardiopulmonary disease.

## 2018-05-23 MED ORDER — NITROFURANTOIN MONOHYD MACRO 100 MG PO CAPS: 100.0000 mg | ORAL_CAPSULE | Freq: Two times a day (BID) | ORAL | 0 refills | Status: DC

## 2018-05-23 MED ORDER — NITROFURANTOIN MONOHYD MACRO 100 MG PO CAPS
100.0000 mg | ORAL_CAPSULE | Freq: Once | ORAL | Status: AC
  Administered 2018-05-23: 100 mg via ORAL
  Filled 2018-05-23: qty 1

## 2018-05-23 NOTE — ED Provider Notes (Signed)
[facility] EMERGENCY DEPARTMENT Provider Note   CSN: [id number] [facility]  1648     History   Chief Complaint Chief Complaint  Patient presents with  . Abnormal Lab  . Flank Pain    HPI Samantha Short is a 72 y.o. female.  She has been having trouble urinating for the past 2 or 3 weeks.  She talked to her primary care doctor and they put her on some antibiotics but apparently the urine culture was being negative.  She was at her endocrinologist office and had some lab work done and they called her back said her calcium was elevated.  They had her recheck her potassium it was even higher.  They called her and told her to come here for evaluation.  Currently she denies any complaints.  There is been no new medications no fevers no chills no nausea no vomiting.  The history is provided by the patient.  Abnormal Lab  Patient referred by:  Specialist Result type: chemistry   Chemistry:    Potassium:  High   Creatinine:  High Flank Pain  Pertinent negatives include no chest pain, no abdominal pain and no shortness of breath.    Past Medical History:  Diagnosis Date  . Asthma   . Chronic diastolic CHF (congestive heart failure) (HCC)    a. 02/2011 Echo: EF 55-60%, Gr2 DD, Mild MR  . Coronary artery disease    a. s/p CABG x 1 2010:  LIMA->LAD.;  b. amdx for CP => LHC 07/04/12: LAD 70-80%, mid RCA 30%, LIMA-LAD patent with competitive flow limiting distal LAD filling, EF 70% with hyperdynamic LV function. Medical therapy continued.  . Dyslipidemia   . GERD (gastroesophageal reflux disease)   . Hyperlipidemia   . Hypertension   . Hyperthyroidism   . Mitral regurgitation    a. mild by echo 02/2011.    Patient Active Problem List   Diagnosis Date Noted  . Weight gain 02/08/2018  . Hyperthyroidism 11/08/2017  . Chronic diastolic CHF (congestive heart failure) (HCC) 07/30/2017  . Chest pain, mid sternal 07/05/2012  . Acid reflux   .  Coronary artery disease   . Hyperlipidemia   . Hypertension   . Heart murmur     Past Surgical History:  Procedure Laterality Date  . ABDOMINAL HYSTERECTOMY  1980  . CARDIAC CATHETERIZATION  07/27/09 & 07/28/09  . CESAREAN SECTION    . CESAREAN SECTION    . CORONARY ARTERY BYPASS GRAFT  08/03/2009   x1 using left internal mammary artery to distal left anterior  descending coronary artery.   Marland Kitchen. GALLBLADDER SURGERY  2001  . LEFT HEART CATHETERIZATION WITH CORONARY/GRAFT ANGIOGRAM N/A 07/05/2012   Procedure: LEFT HEART CATHETERIZATION WITH Isabel CapriceORONARY/GRAFT ANGIOGRAM;  Surgeon: Wendall StadePeter C Nishan, MD;  [facility];  Service: Cardiovascular;  Laterality: N/A;  . SHOULDER SURGERY  1998 / 2001   from accident  . VESICOVAGINAL FISTULA CLOSURE W/ TAH  1998     OB History    Gravida  3   Para  3   Term      Preterm      AB      Living  3     SAB      TAB      Ectopic      Multiple      Live Births               Home Medications  Prior to Admission medications   Medication Sig Start Date End Date Taking? Authorizing Provider  AMBULATORY NON FORMULARY MEDICATION Take 180 mg by mouth daily. Medication Name: bempedoic acid 180 mg vs placebo, study drug provided 10/12/17   Lewayne Bunting, MD  aspirin 81 MG tablet Take 81 mg by mouth daily. 01/15/12   Kathleene Hazel, MD  furosemide (LASIX) 20 MG tablet Take 1 tablet (20 mg total) by mouth daily as needed. 08/17/17 05/06/18  Kathleene Hazel, MD  lisinopril (PRINIVIL,ZESTRIL) 40 MG tablet TAKE 1 TABLET DAILY 10/01/17   Kathleene Hazel, MD  loratadine (CLARITIN) 10 MG tablet Take 10 mg by mouth daily.    [provider]  metoprolol tartrate (LOPRESSOR) 100 MG tablet Take 1 tablet (100 mg total) by mouth 2 (two) times daily. 10/01/17   Supple, Emeline Darling, RPH  Multiple Vitamin (MULTIVITAMIN WITH MINERALS) TABS tablet Take 1 tablet by mouth daily.    [provider]  Multiple  Vitamins-Minerals (OCUVITE ADULT 50+ PO) Take by mouth.    [provider]  Vitamin D, Ergocalciferol, (DRISDOL) 50000 units CAPS capsule Take 1 capsule (50,000 Units total) by mouth every 7 (seven) days. 05/21/18   Filbert Schilder, MD    Family History Family History  Problem Relation Age of Onset  . Cancer Father   . Heart attack Father   . High blood pressure Father   . High Cholesterol Father   . Heart disease Father   . Alcoholism Father   . Obesity Father   . Heart attack Mother   . Heart disease Mother        had pacemaker  . High blood pressure Mother   . High Cholesterol Mother   . Obesity Mother   . Cancer Brother   . Heart disease Brother   . Heart disease Sister   . Cancer Sister   . Angina Sister   . Coronary artery disease Son   . Hypertension Sister   . Hypertension Brother   . Thyroid disease Neg Hx     Social History Social History   Tobacco Use  . Smoking status: Former Smoker    Years: 7.00    Types: Cigarettes    Last attempt to quit: 07/12/2010    Years since quitting: 7.8  . Smokeless tobacco: Never Used  Substance Use Topics  . Alcohol use: No  . Drug use: No     Allergies   Ceclor [cefaclor]; Hctz [hydrochlorothiazide]; Lipitor [atorvastatin calcium]; Norvasc [amlodipine]; Penicillins; Sulfa antibiotics; Statins; Erythromycin; Latex; Levofloxacin; and Tetracyclines & related   Review of Systems Review of Systems  Constitutional: Negative for fever.  HENT: Negative for sore throat.   Respiratory: Negative for shortness of breath.   Cardiovascular: Negative for chest pain.  Gastrointestinal: Negative for abdominal pain.  Genitourinary: Positive for decreased urine volume and flank pain. Negative for dysuria.  Skin: Negative for rash.     Physical Exam Updated Vital Signs BP (!) 178/66   Pulse 74   Temp 97.8 F (36.6 C) (Oral)   Resp 20   SpO2 100%   Physical Exam  Constitutional: She appears well-developed and  well-nourished. No distress.  HENT:  Head: Normocephalic and atraumatic.  Eyes: Conjunctivae are normal.  Neck: Neck supple.  Cardiovascular: Normal rate and regular rhythm.  No murmur heard. Pulmonary/Chest: Effort normal and breath sounds normal. No respiratory distress.  Abdominal: Soft. There is no tenderness.  Musculoskeletal: She exhibits no edema or deformity.  Neurological: She is alert.  Skin: Skin is warm and dry.  Psychiatric: She has a normal mood and affect.  Nursing note and vitals reviewed.    ED Treatments / Results  Labs (all labs ordered are listed, but only abnormal results are displayed) Labs Reviewed  BASIC METABOLIC PANEL - Abnormal; Notable for the following components:      Result Value   Glucose, Bld 112 (*)    BUN 37 (*)    Creatinine, Ser 1.57 (*)    GFR calc non Af Amer 32 (*)    GFR calc Af Amer 37 (*)    All other components within normal limits  CBC  URINALYSIS, ROUTINE W REFLEX MICROSCOPIC  I-STAT TROPONIN, ED    EKG EKG Interpretation  Date/Time:  Thursday May 23 2018 22:05:45 EDT Ventricular Rate:  66 PR Interval:    QRS Duration: 141 QT Interval:  454 QTC Calculation: 476 R Axis:   24 Text Interpretation:  Sinus rhythm Right bundle branch block similar to prior 5/16 Confirmed by Meridee Score [phone]) on 05/23/2018 10:10:17 PM Also confirmed by Meridee Score [phone]), editor Barbette Hair [phone])  on 05/24/2018 7:00:37 AM   Radiology Dg Chest 2 View  Result Date: 05/23/2018 CLINICAL DATA:  Elevated creatinine. EXAM: CHEST - 2 VIEW COMPARISON:  08/02/2016 FINDINGS: The lungs are clear without focal pneumonia, edema, pneumothorax or pleural effusion. The cardio pericardial silhouette is enlarged. Patient is status post median sternotomy. Calcified granulomata in both lungs are again noted, stable. Bones are diffusely demineralized. IMPRESSION: No active cardiopulmonary disease. Electronically Signed   By: Kennith Center M.D.    On: 05/23/2018 18:56    Procedures Procedures (including critical care time)  Medications Ordered in ED Medications - No data to display   Initial Impression / Assessment and Plan / ED Course  I have reviewed the triage vital signs and the nursing notes.  Pertinent labs & imaging results that were available during my care of the patient were reviewed by me and considered in my medical decision making (see chart for details).    Patient sent in for hyperkalemia, found to be normal on testing here. She does have a slighlty elevated creatinine. Also possible uti. Will cover with abx, has a lot of allergies. She understands to followup with pcp for further workup of aki and reevaluation.   Final Clinical Impressions(s) / ED Diagnoses   Final diagnoses:  Renal insufficiency  Urinary tract infection in female    ED Discharge Orders         Ordered    nitrofurantoin, macrocrystal-monohydrate, (MACROBID) 100 MG capsule  2 times daily     05/23/18 2318           Terrilee Files, MD 05/24/18 [phone]

## 2018-05-23 NOTE — ED Notes (Signed)
Pt came up to desk and was saying she wanted to know how long this was going to take. Saying this was ridiculous and unorganized. Advised pt that she had one in front of her and that it has been very busy and that she was not forgotten.

## 2018-05-23 NOTE — Telephone Encounter (Signed)
[facility] Mila PalmerSharon Short (patient's PCP) elevated potassium level.  Repeat potassium on repeat lab is 5.9 (improved from result of 8.3 but still elevated).  Dr. Paulino RilyWolters does not have any appointments and concern that 8.3 was true value.    Patient was called and encouraged to go to the ED for evaluation.  She voiced understanding and denies symptoms.  States when her daughter returns to the house she will go straight to the ED.  She was encouraged to tell ED provider that she was sent for evaluation after elevated potassium found on labs.

## 2018-05-23 NOTE — Telephone Encounter (Signed)
Pt is in a research study.  I spoke with Samantha Short in research and last potassium checked by research was 4.7 on August 2,2019.

## 2018-05-23 NOTE — ED Notes (Signed)
Reviewed d/c instructions with pt, who verbalized understanding and had no outstanding questions. Pt departed in NAD, refused use of wheelchair.   

## 2018-05-23 NOTE — Telephone Encounter (Signed)
New Message        Pt c/o medication issue:  1. Name of Medication: lisinopril (PRINIVIL,ZESTRIL) 40 MG tablet  2. How are you currently taking this medication (dosage and times per day)? Once day  3. Are you having a reaction (difficulty breathing--STAT)? No   4. What is your medication issue? Patient states her potassium is going up. Patient got a call from the lab and it's now higher than 5.6. Pls call and advise.

## 2018-05-23 NOTE — Discharge Instructions (Addendum)
You were evaluated in the emergency department for an abnormal potassium.  We repeated that test here and your potassium was in the normal range.  Your creatinine which is a measure of your kidney function is slightly elevated.  This will need to be followed up with your primary care doctor.  Also you possibly have a urine infection and we are prescribing you an antibiotic.  These return if any concerns.

## 2018-05-23 NOTE — ED Notes (Signed)
Called [facility]- states they can add on urine culture

## 2018-05-23 NOTE — ED Triage Notes (Signed)
Pt presents with concern for potassium levels being elevated; pt being followed by [facility]; was 5.6 and then 8.6 and now higher

## 2018-05-23 NOTE — Telephone Encounter (Signed)
Received call transferred directly from operator and spoke with pt. She reports she received call from nurse at [facility] that her potassium was elevated and they needed her to come in and have it rechecked as soon as possible.  She states it had been elevated before and is now more elevated.   I advised pt to have lab work rechecked as soon as possible and follow up with provider who ordered this.  She states she was told she may need to follow up with primary care or cardiology.  I told pt ordering provider should contact primary care or cardiology if needed once lab results known.

## 2018-05-24 LAB — POTASSIUM: Potassium: 5.9 mmol/L — ABNORMAL HIGH (ref 3.5–5.2)

## 2018-05-25 LAB — URINE CULTURE

## 2018-05-27 ENCOUNTER — Encounter (INDEPENDENT_AMBULATORY_CARE_PROVIDER_SITE_OTHER): Payer: Self-pay | Admitting: Family Medicine

## 2018-05-31 DIAGNOSIS — R0602 Shortness of breath: Secondary | ICD-10-CM | POA: Diagnosis not present

## 2018-05-31 DIAGNOSIS — N3000 Acute cystitis without hematuria: Secondary | ICD-10-CM | POA: Diagnosis not present

## 2018-05-31 DIAGNOSIS — N179 Acute kidney failure, unspecified: Secondary | ICD-10-CM | POA: Diagnosis not present

## 2018-06-04 ENCOUNTER — Ambulatory Visit (INDEPENDENT_AMBULATORY_CARE_PROVIDER_SITE_OTHER): Payer: Medicare HMO | Admitting: Family Medicine

## 2018-06-04 VITALS — BP 126/73 | HR 63 | Ht 62.0 in | Wt 210.0 lb

## 2018-06-04 DIAGNOSIS — E559 Vitamin D deficiency, unspecified: Secondary | ICD-10-CM

## 2018-06-04 DIAGNOSIS — N3000 Acute cystitis without hematuria: Secondary | ICD-10-CM

## 2018-06-04 DIAGNOSIS — Z6838 Body mass index (BMI) 38.0-38.9, adult: Secondary | ICD-10-CM | POA: Diagnosis not present

## 2018-06-04 MED ORDER — VITAMIN D (ERGOCALCIFEROL) 1.25 MG (50000 UNIT) PO CAPS: 50000.0000 [IU] | ORAL_CAPSULE | ORAL | 0 refills | Status: DC

## 2018-06-04 NOTE — Progress Notes (Signed)
Office: [phone]  /  Fax: [phone]   HPI:   Chief Complaint: OBESITY Samantha Short is here to discuss her progress with her obesity treatment plan. She is on the  follow our Pescatarian  plan and is following her eating plan approximately 0 % of the time. She states she is exercising 20 minutes 2 times per week. Samantha Short took herself off of the diet secondary to concern of kidney function and higher protein diet.  Her weight is 210 lb (95.3 kg) today and has had a weight loss of 1 pounds over a period of 2 weeks since her last Short. She has lost 4 lbs since starting treatment with [place].  Vitamin D deficiency Samantha Short has a diagnosis of vitamin D deficiency. She is currently taking vit D and denies nausea, vomiting or muscle weakness. She is positive for fatigue.   UTI (c/o Hematuria) Patient went to the ER secondary to elevated potassium found to have an UTI. Her Urine culture showing multiple species-finished her Macrobid.     ALLERGIES: Allergies  Allergen Reactions  . Ceclor [Cefaclor] Other (See Comments)    Lost vision in eye  . Hctz [Hydrochlorothiazide] Other (See Comments)    Renal insufficiency  . Lipitor [Atorvastatin Calcium] Other (See Comments)    Increased A1C  . Norvasc [Amlodipine] Other (See Comments)    Makes patient stiff  . Penicillins Hives, Shortness Of Breath, Itching and Rash    Has patient had a PCN reaction causing immediate rash, facial/tongue/throat swelling, SOB or lightheadedness with hypotension: Yes Has patient had a PCN reaction causing severe rash involving mucus membranes or skin necrosis: Unk Has patient had a PCN reaction that required hospitalization: No Has patient had a PCN reaction occurring within the last 10 years: No If all of the above answers are "NO", then may proceed with Cephalosporin use.   . Sulfa Antibiotics Other (See Comments)    CAUSES SHOCK  . Statins Other (See Comments)    MYALGIAS AND WEAKNESS  . Erythromycin Rash  .  Latex Rash  . Levofloxacin Rash  . Tetracyclines & Related Rash    MEDICATIONS: Current Outpatient Medications on File Prior to Short  Medication Sig Dispense Refill  . acetaminophen (TYLENOL) 325 MG tablet Take 325 mg by mouth every 6 (six) hours as needed (for pain).    Marland Kitchen aspirin 81 MG tablet Take 81 mg by mouth Samantha bedtime.     . Investigational - Study Medication Take 180 mg by mouth See admin instructions. Unnamed study/Medication Name/alternative to statins: Bempedoic Acid 180 mg vs placebo, study drug provided: Take 180 mg by mouth once a day    . lisinopril (PRINIVIL,ZESTRIL) 40 MG tablet TAKE 1 TABLET DAILY 90 tablet 3  . loratadine (CLARITIN) 10 MG tablet Take 10 mg by mouth daily.    . metoprolol tartrate (LOPRESSOR) 100 MG tablet Take 1 tablet (100 mg total) by mouth 2 (two) times daily. 180 tablet 3  . Multiple Vitamin (MULTIVITAMIN WITH MINERALS) TABS tablet Take 1 tablet by mouth daily.    . Multiple Vitamins-Minerals (OCUVITE ADULT 50+ PO) Take 1 capsule by mouth 2 (two) times daily.     . nitrofurantoin, macrocrystal-monohydrate, (MACROBID) 100 MG capsule Take 1 capsule (100 mg total) by mouth 2 (two) times daily. 10 capsule 0  . furosemide (LASIX) 20 MG tablet Take 1 tablet (20 mg total) by mouth daily as needed. (Patient taking differently: Take 20 mg by mouth daily. ) 90 tablet 3   No  current facility-administered medications on file prior to Short.     PAST MEDICAL HISTORY: Past Medical History:  Diagnosis Date  . Asthma   . Chronic diastolic CHF (congestive heart failure) (HCC)    a. 02/2011 Echo: EF 55-60%, Gr2 DD, Mild MR  . Coronary artery disease    a. s/p CABG x 1 2010:  LIMA->LAD.;  b. amdx for CP => LHC 07/04/12: LAD 70-80%, mid RCA 30%, LIMA-LAD patent with competitive flow limiting distal LAD filling, EF 70% with hyperdynamic LV function. Medical therapy continued.  . Dyslipidemia   . GERD (gastroesophageal reflux disease)   . Hyperlipidemia   .  Hypertension   . Hyperthyroidism   . Mitral regurgitation    a. mild by echo 02/2011.    PAST SURGICAL HISTORY: Past Surgical History:  Procedure Laterality Date  . ABDOMINAL HYSTERECTOMY  1980  . CARDIAC CATHETERIZATION  07/27/09 & 07/28/09  . CESAREAN SECTION    . CESAREAN SECTION    . CORONARY ARTERY BYPASS GRAFT  08/03/2009   x1 using left internal mammary artery to distal left anterior  descending coronary artery.   Marland Kitchen GALLBLADDER SURGERY  2001  . LEFT HEART CATHETERIZATION WITH CORONARY/GRAFT ANGIOGRAM N/A 07/05/2012   Procedure: LEFT HEART CATHETERIZATION WITH Isabel Caprice;  Surgeon: Wendall Stade, MD;  Location: [facility] CATH LAB;  Service: Cardiovascular;  Laterality: N/A;  . SHOULDER SURGERY  1998 / 2001   from accident  . VESICOVAGINAL FISTULA CLOSURE W/ TAH  1998    SOCIAL HISTORY: Social History   Tobacco Use  . Smoking status: Former Smoker    Years: 7.00    Types: Cigarettes    Last attempt to quit: 07/12/2010    Years since quitting: 7.9  . Smokeless tobacco: Never Used  Substance Use Topics  . Alcohol use: No  . Drug use: No    FAMILY HISTORY: Family History  Problem Relation Age of Onset  . Cancer Father   . Heart attack Father   . High blood pressure Father   . High Cholesterol Father   . Heart disease Father   . Alcoholism Father   . Obesity Father   . Heart attack Mother   . Heart disease Mother        had pacemaker  . High blood pressure Mother   . High Cholesterol Mother   . Obesity Mother   . Cancer Brother   . Heart disease Brother   . Heart disease Sister   . Cancer Sister   . Angina Sister   . Coronary artery disease Son   . Hypertension Sister   . Hypertension Brother   . Thyroid disease Neg Hx     ROS: Review of Systems  Constitutional: Positive for malaise/fatigue and weight loss.  All other systems reviewed and are negative.   PHYSICAL EXAM: Blood pressure 126/73, pulse 63, height 5\' 2"  (1.575 m), weight 210 lb  (95.3 kg), SpO2 96 %. Body mass index is 38.41 kg/m. Physical Exam  Constitutional: She appears well-developed and well-nourished.  Eyes: EOM are normal.  Neck: Normal range of motion.  Pulmonary/Chest: Effort normal.  Musculoskeletal: Normal range of motion.  Skin: Skin is warm and dry.  Psychiatric: She has a normal mood and affect. Her behavior is normal.    RECENT LABS AND TESTS: BMET    Component Value Date/Time   NA 140 05/23/2018 1804   NA 142 05/06/2018 1203   K 4.7 05/23/2018 1804   CL 108 05/23/2018 1804  CO2 22 05/23/2018 1804   GLUCOSE 112 (H) 05/23/2018 1804   BUN 37 (H) 05/23/2018 1804   BUN 20 05/06/2018 1203   CREATININE 1.57 (H) 05/23/2018 1804   CREATININE 1.13 (H) 06/28/2016 0930   CALCIUM 9.8 05/23/2018 1804   GFRNONAA 32 (L) 05/23/2018 1804   GFRAA 37 (L) 05/23/2018 1804   Lab Results  Component Value Date   HGBA1C 5.5 05/06/2018   HGBA1C  07/29/2009    5.8 (NOTE) The ADA recommends the following therapeutic goal for glycemic control related to Hgb A1c measurement: Goal of therapy: <6.5 Hgb A1c  Reference: American Diabetes Association: Clinical Practice Recommendations 2010, Diabetes Care, 2010, 33: (Suppl  1).   Lab Results  Component Value Date   INSULIN 19.5 05/06/2018   CBC    Component Value Date/Time   WBC 8.6 05/23/2018 1804   RBC 4.06 05/23/2018 1804   HGB 12.4 05/23/2018 1804   HGB 13.1 05/06/2018 1203   HCT 39.1 05/23/2018 1804   HCT 39.6 05/06/2018 1203   PLT 235 05/23/2018 1804   MCV 96.3 05/23/2018 1804   MCV 93 05/06/2018 1203   MCH 30.5 05/23/2018 1804   MCHC 31.7 05/23/2018 1804   RDW 12.4 05/23/2018 1804   RDW 13.1 05/06/2018 1203   LYMPHSABS 2.1 05/06/2018 1203   MONOABS 0.5 01/13/2015 1519   EOSABS 0.2 05/06/2018 1203   BASOSABS 0.0 05/06/2018 1203   Iron/TIBC/Ferritin/ %Sat No results found for: IRON, TIBC, FERRITIN, IRONPCTSAT Lipid Panel     Component Value Date/Time   CHOL 141 05/06/2018 1203   TRIG 69  05/06/2018 1203   HDL 50 05/06/2018 1203   CHOLHDL 3.1 08/07/2017 1003   CHOLHDL 3.3 06/28/2016 0930   VLDL 15 06/28/2016 0930   LDLCALC 77 05/06/2018 1203   Hepatic Function Panel     Component Value Date/Time   PROT 7.3 05/06/2018 1203   ALBUMIN 4.4 05/06/2018 1203   AST 36 05/06/2018 1203   ALT 30 05/06/2018 1203   ALKPHOS 54 05/06/2018 1203   BILITOT 0.5 05/06/2018 1203   BILIDIR 0.1 06/04/2014 0944      Component Value Date/Time   TSH 1.13 04/26/2018 1121   TSH 1.11 03/13/2018 1136   TSH 0.26 (L) 02/08/2018 1136    ASSESSMENT AND PLAN: Vitamin D deficiency - Plan: Vitamin D, Ergocalciferol, (DRISDOL) 50000 units CAPS capsule  Acute cystitis without hematuria  Class 2 severe obesity with serious comorbidity and body mass index (BMI) of 38.0 to 38.9 in adult, unspecified obesity type (HCC)  PLAN: Vitamin D Deficiency Samantha Short was informed that low vitamin D levels contributes to fatigue and are associated with obesity, breast, and colon cancer. She agrees to continue to take prescription Vit D @50 ,000 IU every week in which a prescription was written today for a 30 day supply and will follow up for routine testing of vitamin D, Samantha least 2-3 times per year. She was informed of the risk of over-replacement of vitamin D and agrees to not increase her dose unless she discusses this with [place] first.  UTI Patient will follow up with her PCP as needed  Obesity Samantha Short is currently in the action stage of change. As such, her goal is to continue with weight loss efforts Samantha Short has Short to follow the Pescatarian plan  Samantha Short has been instructed to work up to a goal of 150 minutes of combined cardio and strengthening exercise per week for weight loss and overall health benefits. We discussed the following Behavioral  Modification Stratagies today: increasing lean protein intake, increasing vegetables and work on meal planning and easy cooking plans and planning for success.   Samantha Short has  Short to follow up with our clinic in 2 weeks. She was informed of the importance of frequent follow up visits to maximize her success with intensive lifestyle modifications for her multiple health conditions.   OBESITY BEHAVIORAL INTERVENTION Short  Today's Short was # 3   Starting weight: 214 Starting date: 05/06/18 Today's weight : Weight: 210 lb (95.3 kg)  Today's date: 06/04/2018 Total lbs lost to date: 4 Samantha Short.   ASK: We discussed the diagnosis of obesity with Samantha Short to give Samantha Short permission to discuss obesity behavioral modification therapy today.  ASSESS: Jaslyne has the diagnosis of obesity and her BMI today is @TBMI @ Bristyn is in the action stage of change   ADVISE: Samantha Short of weight loss to improve her health. She was advised of the need for long term treatment and the importance of lifestyle modifications to improve her current health and to decrease her risk of future health problems.  AGREE: Multiple dietary modification options and treatment options were discussed and  Samantha Short Short to follow the recommendations documented in the above note.  ARRANGE: Samantha Short was educated on the importance of frequent visits to treat obesity as outlined per CMS and USPSTF guidelines and Short to schedule her next follow up appointment today.  I, April Moore, am acting as Energy managertranscriptionist for Dr Debbra RidingAlexandria Kadolph.  I have reviewed the above documentation for accuracy and completeness, and I agree with the above. - Debbra RidingAlexandria Kadolph, MD

## 2018-06-10 ENCOUNTER — Other Ambulatory Visit: Payer: Self-pay | Admitting: Family Medicine

## 2018-06-10 DIAGNOSIS — N183 Chronic kidney disease, stage 3 unspecified: Secondary | ICD-10-CM

## 2018-06-13 ENCOUNTER — Ambulatory Visit
Admission: RE | Admit: 2018-06-13 | Discharge: 2018-06-13 | Disposition: A | Discharge status: Home / Self Care | Payer: Medicare HMO | Source: Ambulatory Visit | Attending: Family Medicine | Admitting: Family Medicine

## 2018-06-13 DIAGNOSIS — N183 Chronic kidney disease, stage 3 unspecified: Secondary | ICD-10-CM

## 2018-06-13 DIAGNOSIS — I129 Hypertensive chronic kidney disease with stage 1 through stage 4 chronic kidney disease, or unspecified chronic kidney disease: Secondary | ICD-10-CM | POA: Diagnosis not present

## 2018-06-13 DIAGNOSIS — N189 Chronic kidney disease, unspecified: Secondary | ICD-10-CM | POA: Diagnosis not present

## 2018-06-18 ENCOUNTER — Ambulatory Visit (INDEPENDENT_AMBULATORY_CARE_PROVIDER_SITE_OTHER): Payer: Medicare HMO | Admitting: Family Medicine

## 2018-06-18 VITALS — BP 140/73 | HR 57 | Temp 97.5°F | Ht 62.0 in | Wt 207.0 lb

## 2018-06-18 DIAGNOSIS — E559 Vitamin D deficiency, unspecified: Secondary | ICD-10-CM

## 2018-06-18 DIAGNOSIS — I10 Essential (primary) hypertension: Secondary | ICD-10-CM | POA: Diagnosis not present

## 2018-06-18 DIAGNOSIS — Z6837 Body mass index (BMI) 37.0-37.9, adult: Secondary | ICD-10-CM | POA: Diagnosis not present

## 2018-06-19 NOTE — Progress Notes (Signed)
Office: [phone]  /  Fax: [phone]   HPI:   Chief Complaint: OBESITY Samantha Short is here to discuss her progress with her obesity treatment plan. She is on the Pescatarian eating plan and is following her eating plan approximately 85 % of the time. She states she is exercising 0 minutes 0 times per week. Samantha Short is liking the Pescatarian eating plan and she would like to continue. She is sticking to the [facility] plan and it feels more natural. Samantha Short is going to [facility] November 12th. Her weight is 207 lb (93.9 kg) today and has had a weight loss of 3 pounds over a period of 2 weeks since her last visit. She has lost 7 lbs since starting treatment with [place].  Vitamin D deficiency Samantha Short has a diagnosis of vitamin D deficiency. Samantha Short is currently taking vit D and she admits to fatigue, but she denies nausea, vomiting or muscle weakness.  Hypertension Samantha Short is a 72 y.o. female with hypertension. Her blood pressure is slightly elevated today. Samantha Short had increased stress prior to her appointment today. Samantha Short denies chest pain, chest pressure or headache. She is working weight loss to help control her blood pressure with the goal of decreasing her risk of heart attack and stroke. Samantha Short blood pressure is not currently controlled.  ALLERGIES: Allergies  Allergen Reactions  . Ceclor [Cefaclor] Other (See Comments)    Lost vision in eye  . Hctz [Hydrochlorothiazide] Other (See Comments)    Renal insufficiency  . Lipitor [Atorvastatin Calcium] Other (See Comments)    Increased A1C  . Norvasc [Amlodipine] Other (See Comments)    Makes patient stiff  . Penicillins Hives, Shortness Of Breath, Itching and Rash    Has patient had a PCN reaction causing immediate rash, facial/tongue/throat swelling, SOB or lightheadedness with hypotension: Yes Has patient had a PCN reaction causing severe rash involving mucus membranes or skin necrosis: Unk Has patient had a PCN reaction that  required hospitalization: No Has patient had a PCN reaction occurring within the last 10 years: No If all of the above answers are "NO", then may proceed with Cephalosporin use.   . Sulfa Antibiotics Other (See Comments)    CAUSES SHOCK  . Statins Other (See Comments)    MYALGIAS AND WEAKNESS  . Erythromycin Rash  . Latex Rash  . Levofloxacin Rash  . Tetracyclines & Related Rash    MEDICATIONS: Current Outpatient Medications on File Prior to Visit  Medication Sig Dispense Refill  . acetaminophen (TYLENOL) 325 MG tablet Take 325 mg by mouth every 6 (six) hours as needed (for pain).    Marland Kitchen aspirin 81 MG tablet Take 81 mg by mouth at bedtime.     . Investigational - Study Medication Take 180 mg by mouth See admin instructions. Unnamed study/Medication Name/alternative to statins: Bempedoic Acid 180 mg vs placebo, study drug provided: Take 180 mg by mouth once a day    . lisinopril (PRINIVIL,ZESTRIL) 40 MG tablet TAKE 1 TABLET DAILY 90 tablet 3  . loratadine (CLARITIN) 10 MG tablet Take 10 mg by mouth daily.    . metoprolol tartrate (LOPRESSOR) 100 MG tablet Take 1 tablet (100 mg total) by mouth 2 (two) times daily. 180 tablet 3  . Multiple Vitamin (MULTIVITAMIN WITH MINERALS) TABS tablet Take 1 tablet by mouth daily.    . Multiple Vitamins-Minerals (OCUVITE ADULT 50+ PO) Take 1 capsule by mouth 2 (two) times daily.     . nitrofurantoin, macrocrystal-monohydrate, (MACROBID) 100 MG  capsule Take 1 capsule (100 mg total) by mouth 2 (two) times daily. 10 capsule 0  . Vitamin D, Ergocalciferol, (DRISDOL) 50000 units CAPS capsule Take 1 capsule (50,000 Units total) by mouth every 7 (seven) days. 4 capsule 0  . furosemide (LASIX) 20 MG tablet Take 1 tablet (20 mg total) by mouth daily as needed. (Patient taking differently: Take 20 mg by mouth daily. ) 90 tablet 3   No current facility-administered medications on file prior to visit.     PAST MEDICAL HISTORY: Past Medical History:  Diagnosis  Date  . Asthma   . Chronic diastolic CHF (congestive heart failure) (HCC)    a. 02/2011 Echo: EF 55-60%, Gr2 DD, Mild MR  . Coronary artery disease    a. s/p CABG x 1 2010:  LIMA->LAD.;  b. amdx for CP => LHC 07/04/12: LAD 70-80%, mid RCA 30%, LIMA-LAD patent with competitive flow limiting distal LAD filling, EF 70% with hyperdynamic LV function. Medical therapy continued.  . Dyslipidemia   . GERD (gastroesophageal reflux disease)   . Hyperlipidemia   . Hypertension   . Hyperthyroidism   . Mitral regurgitation    a. mild by echo 02/2011.    PAST SURGICAL HISTORY: Past Surgical History:  Procedure Laterality Date  . ABDOMINAL HYSTERECTOMY  1980  . CARDIAC CATHETERIZATION  07/27/09 & 07/28/09  . CESAREAN SECTION    . CESAREAN SECTION    . CORONARY ARTERY BYPASS GRAFT  08/03/2009   x1 using left internal mammary artery to distal left anterior  descending coronary artery.   Marland Kitchen GALLBLADDER SURGERY  2001  . LEFT HEART CATHETERIZATION WITH CORONARY/GRAFT ANGIOGRAM N/A 07/05/2012   Procedure: LEFT HEART CATHETERIZATION WITH Isabel Caprice;  Surgeon: Wendall Stade, MD;  Location: [facility] CATH LAB;  Service: Cardiovascular;  Laterality: N/A;  . SHOULDER SURGERY  1998 / 2001   from accident  . VESICOVAGINAL FISTULA CLOSURE W/ TAH  1998    SOCIAL HISTORY: Social History   Tobacco Use  . Smoking status: Former Smoker    Years: 7.00    Types: Cigarettes    Last attempt to quit: 07/12/2010    Years since quitting: 7.9  . Smokeless tobacco: Never Used  Substance Use Topics  . Alcohol use: No  . Drug use: No    FAMILY HISTORY: Family History  Problem Relation Age of Onset  . Cancer Father   . Heart attack Father   . High blood pressure Father   . High Cholesterol Father   . Heart disease Father   . Alcoholism Father   . Obesity Father   . Heart attack Mother   . Heart disease Mother        had pacemaker  . High blood pressure Mother   . High Cholesterol Mother   .  Obesity Mother   . Cancer Brother   . Heart disease Brother   . Heart disease Sister   . Cancer Sister   . Angina Sister   . Coronary artery disease Son   . Hypertension Sister   . Hypertension Brother   . Thyroid disease Neg Hx     ROS: Review of Systems  Constitutional: Positive for malaise/fatigue and weight loss.  Cardiovascular: Negative for chest pain.       Negative for chest pressure  Gastrointestinal: Negative for nausea and vomiting.  Musculoskeletal:       Negative for muscle weakness  Neurological: Negative for headaches.    PHYSICAL EXAM: Blood pressure 140/73, pulse Marland Kitchen)  57, temperature (!) 97.5 F (36.4 C), temperature source Oral, height 5\' 2"  (1.575 m), weight 207 lb (93.9 kg), SpO2 99 %. Body mass index is 37.86 kg/m. Physical Exam  Constitutional: She is oriented to person, place, and time. She appears well-developed and well-nourished.  Cardiovascular: Normal rate.  Pulmonary/Chest: Effort normal.  Musculoskeletal: Normal range of motion.  Neurological: She is oriented to person, place, and time.  Skin: Skin is warm and dry.  Psychiatric: She has a normal mood and affect. Her behavior is normal.  Vitals reviewed.   RECENT LABS AND TESTS: BMET    Component Value Date/Time   NA 140 05/23/2018 1804   NA 142 05/06/2018 1203   K 4.7 05/23/2018 1804   CL 108 05/23/2018 1804   CO2 22 05/23/2018 1804   GLUCOSE 112 (H) 05/23/2018 1804   BUN 37 (H) 05/23/2018 1804   BUN 20 05/06/2018 1203   CREATININE 1.57 (H) 05/23/2018 1804   CREATININE 1.13 (H) 06/28/2016 0930   CALCIUM 9.8 05/23/2018 1804   GFRNONAA 32 (L) 05/23/2018 1804   GFRAA 37 (L) 05/23/2018 1804   Lab Results  Component Value Date   HGBA1C 5.5 05/06/2018   HGBA1C  07/29/2009    5.8 (NOTE) The ADA recommends the following therapeutic goal for glycemic control related to Hgb A1c measurement: Goal of therapy: <6.5 Hgb A1c  Reference: American Diabetes Association: Clinical Practice  Recommendations 2010, Diabetes Care, 2010, 33: (Suppl  1).   Lab Results  Component Value Date   INSULIN 19.5 05/06/2018   CBC    Component Value Date/Time   WBC 8.6 05/23/2018 1804   RBC 4.06 05/23/2018 1804   HGB 12.4 05/23/2018 1804   HGB 13.1 05/06/2018 1203   HCT 39.1 05/23/2018 1804   HCT 39.6 05/06/2018 1203   PLT 235 05/23/2018 1804   MCV 96.3 05/23/2018 1804   MCV 93 05/06/2018 1203   MCH 30.5 05/23/2018 1804   MCHC 31.7 05/23/2018 1804   RDW 12.4 05/23/2018 1804   RDW 13.1 05/06/2018 1203   LYMPHSABS 2.1 05/06/2018 1203   MONOABS 0.5 01/13/2015 1519   EOSABS 0.2 05/06/2018 1203   BASOSABS 0.0 05/06/2018 1203   Iron/TIBC/Ferritin/ %Sat No results found for: IRON, TIBC, FERRITIN, IRONPCTSAT Lipid Panel     Component Value Date/Time   CHOL 141 05/06/2018 1203   TRIG 69 05/06/2018 1203   HDL 50 05/06/2018 1203   CHOLHDL 3.1 08/07/2017 1003   CHOLHDL 3.3 06/28/2016 0930   VLDL 15 06/28/2016 0930   LDLCALC 77 05/06/2018 1203   Hepatic Function Panel     Component Value Date/Time   PROT 7.3 05/06/2018 1203   ALBUMIN 4.4 05/06/2018 1203   AST 36 05/06/2018 1203   ALT 30 05/06/2018 1203   ALKPHOS 54 05/06/2018 1203   BILITOT 0.5 05/06/2018 1203   BILIDIR 0.1 06/04/2014 0944      Component Value Date/Time   TSH 1.13 04/26/2018 1121   TSH 1.11 03/13/2018 1136   TSH 0.26 (L) 02/08/2018 1136   Results for SARABELLE, GENSON (MRN [id number]) as of 06/19/2018 09:55  Ref. Range 05/06/2018 12:03  Vitamin D, 25-Hydroxy Latest Ref Range: 30.0 - 100.0 ng/mL 45.2   ASSESSMENT AND PLAN: Vitamin D deficiency  Essential hypertension  Class 2 severe obesity with serious comorbidity and body mass index (BMI) of 37.0 to 37.9 in adult, unspecified obesity type (HCC)  PLAN:  Vitamin D Deficiency Samantha Short was informed that low vitamin D levels contributes to fatigue  and are associated with obesity, breast, and colon cancer. She agrees to continue to take prescription Vit D  @50 ,000 IU every week (no refill needed) and will follow up for routine testing of vitamin D, at least 2-3 times per year. She was informed of the risk of over-replacement of vitamin D and agrees to not increase her dose unless she discusses this with [place] first.  Hypertension We discussed sodium restriction, working on healthy weight loss, and a regular exercise program as the means to achieve improved blood pressure control. Samantha Short agreed with this plan and agreed to follow up as directed. We will follow up blood pressure at the next appointment and will continue to monitor her blood pressure as well as her progress with the above lifestyle modifications. She will continue her medications as prescribed and will watch for signs of hypotension as she continues her lifestyle modifications.  I spent > than 50% of the 15 minute visit on counseling as documented in the note.  Obesity Samantha Short is currently in the action stage of change. As such, her goal is to continue with weight loss efforts She has agreed to follow the Pescatarian eating plan Samantha Short has been instructed to work up to a goal of 150 minutes of combined cardio and strengthening exercise per week for weight loss and overall health benefits. We discussed the following Behavioral Modification Strategies today: better snacking choices, planning for success, increasing lean protein intake, increasing vegetables and work on meal planning and easy cooking plans  Samantha Short has agreed to follow up with our clinic in 2 weeks. She was informed of the importance of frequent follow up visits to maximize her success with intensive lifestyle modifications for her multiple health conditions.   OBESITY BEHAVIORAL INTERVENTION VISIT  Today's visit was # 4   Starting weight: 214 lbs Starting date: 05/06/18 Today's weight : 207 lbs  Today's date: 06/18/2018 Total lbs lost to date: 7   ASK: We discussed the diagnosis of obesity with Samantha Short today and  Samantha Short agreed to give [place] permission to discuss obesity behavioral modification therapy today.  ASSESS: Samantha Short has the diagnosis of obesity and her BMI today is 37.85 Samantha Short is in the action stage of change   ADVISE: Samantha Short was educated on the multiple health risks of obesity as well as the benefit of weight loss to improve her health. She was advised of the need for long term treatment and the importance of lifestyle modifications to improve her current health and to decrease her risk of future health problems.  AGREE: Multiple dietary modification options and treatment options were discussed and  Samantha Short agreed to follow the recommendations documented in the above note.  ARRANGE: Samantha Short was educated on the importance of frequent visits to treat obesity as outlined per CMS and USPSTF guidelines and agreed to schedule her next follow up appointment today.  I, Nevada Crane, am acting as transcriptionist for Filbert Schilder, MD  I have reviewed the above documentation for accuracy and completeness, and I agree with the above. - Debbra Riding, MD

## 2018-06-20 DIAGNOSIS — R69 Illness, unspecified: Secondary | ICD-10-CM | POA: Diagnosis not present

## 2018-06-25 ENCOUNTER — Other Ambulatory Visit: Payer: Self-pay | Admitting: Cardiovascular Disease

## 2018-06-25 ENCOUNTER — Other Ambulatory Visit: Payer: Self-pay | Admitting: Physician Assistant

## 2018-06-25 DIAGNOSIS — I10 Essential (primary) hypertension: Secondary | ICD-10-CM

## 2018-06-26 ENCOUNTER — Encounter: Payer: Self-pay | Admitting: Endocrinology

## 2018-06-26 ENCOUNTER — Ambulatory Visit (INDEPENDENT_AMBULATORY_CARE_PROVIDER_SITE_OTHER): Payer: Medicare HMO | Admitting: Endocrinology

## 2018-06-26 VITALS — BP 128/72 | HR 56 | Ht 62.0 in | Wt 210.2 lb

## 2018-06-26 DIAGNOSIS — E0590 Thyrotoxicosis, unspecified without thyrotoxic crisis or storm: Secondary | ICD-10-CM | POA: Diagnosis not present

## 2018-06-26 LAB — T4, FREE: FREE T4: 1.51 ng/dL (ref 0.60–1.60)

## 2018-06-26 LAB — TSH: TSH: 0.34 u[IU]/mL — AB (ref 0.35–4.50)

## 2018-06-26 NOTE — Patient Instructions (Addendum)
Thyroid blood tests are requested for you today.  We'll let you know about the results.  Please come back for a follow-up appointment in 4 months.    

## 2018-06-26 NOTE — Progress Notes (Signed)
Subjective:    Patient ID: Samantha Short, female    DOB: Aug 05, 1946, 72 y.o.   MRN: [id number]  HPI Pt returns for f/u of hyperthyroidism (dx'ed early 2019; CT (2008) showed small thyroid cysts; nuc med scan was c/w multinodular goiter; she had RAI 4/19; she became euthyroid a few mos later).  pt says nausea is due to renal insuff.   Past Medical History:  Diagnosis Date  . Asthma   . Chronic diastolic CHF (congestive heart failure) (HCC)    a. 02/2011 Echo: EF 55-60%, Gr2 DD, Mild MR  . Chronic kidney disease   . Coronary artery disease    a. s/p CABG x 1 2010:  LIMA->LAD.;  b. amdx for CP => LHC 07/04/12: LAD 70-80%, mid RCA 30%, LIMA-LAD patent with competitive flow limiting distal LAD filling, EF 70% with hyperdynamic LV function. Medical therapy continued.  . Dyslipidemia   . GERD (gastroesophageal reflux disease)   . Hyperlipidemia   . Hypertension   . Hyperthyroidism   . Mitral regurgitation    a. mild by echo 02/2011.    Past Surgical History:  Procedure Laterality Date  . ABDOMINAL HYSTERECTOMY  1980  . CARDIAC CATHETERIZATION  07/27/09 & 07/28/09  . CESAREAN SECTION    . CESAREAN SECTION    . CORONARY ARTERY BYPASS GRAFT  08/03/2009   x1 using left internal mammary artery to distal left anterior  descending coronary artery.   Marland Kitchen GALLBLADDER SURGERY  2001  . LEFT HEART CATHETERIZATION WITH CORONARY/GRAFT ANGIOGRAM N/A 07/05/2012   Procedure: LEFT HEART CATHETERIZATION WITH Isabel Caprice;  Surgeon: Wendall Stade, MD;  Location: [facility] CATH LAB;  Service: Cardiovascular;  Laterality: N/A;  . SHOULDER SURGERY  1998 / 2001   from accident  . VESICOVAGINAL FISTULA CLOSURE W/ TAH  1998    Social History   Socioeconomic History  . Marital status: Widowed    Spouse name: Not on file  . Number of children: 3  . Years of education: Not on file  . Highest education level: Not on file  Occupational History  . Occupation: Retired- Engineer, site  Social Needs    . Financial resource strain: Not on file  . Food insecurity:    Worry: Not on file    Inability: Not on file  . Transportation needs:    Medical: Not on file    Non-medical: Not on file  Tobacco Use  . Smoking status: Former Smoker    Years: 7.00    Types: Cigarettes    Last attempt to quit: 07/12/2010    Years since quitting: 7.9  . Smokeless tobacco: Never Used  Substance and Sexual Activity  . Alcohol use: No  . Drug use: No  . Sexual activity: Not on file  Lifestyle  . Physical activity:    Days per week: Not on file    Minutes per session: Not on file  . Stress: Not on file  Relationships  . Social connections:    Talks on phone: Not on file    Gets together: Not on file    Attends religious service: Not on file    Active member of club or organization: Not on file    Attends meetings of clubs or organizations: Not on file    Relationship status: Not on file  . Intimate partner violence:    Fear of current or ex partner: Not on file    Emotionally abused: Not on file    Physically abused: Not  on file    Forced sexual activity: Not on file  Other Topics Concern  . Not on file  Social History Narrative  . Not on file    Current Outpatient Medications on File Prior to Visit  Medication Sig Dispense Refill  . acetaminophen (TYLENOL) 325 MG tablet Take 325 mg by mouth every 6 (six) hours as needed (for pain).    Marland Kitchen aspirin 81 MG tablet Take 81 mg by mouth at bedtime.     . Investigational - Study Medication Take 180 mg by mouth See admin instructions. Unnamed study/Medication Name/alternative to statins: Bempedoic Acid 180 mg vs placebo, study drug provided: Take 180 mg by mouth once a day    . lisinopril (PRINIVIL,ZESTRIL) 40 MG tablet TAKE 1 TABLET DAILY 90 tablet 0  . loratadine (CLARITIN) 10 MG tablet Take 10 mg by mouth daily.    . Multiple Vitamin (MULTIVITAMIN WITH MINERALS) TABS tablet Take 1 tablet by mouth daily.    . Multiple Vitamins-Minerals (OCUVITE  ADULT 50+ PO) Take 1 capsule by mouth 2 (two) times daily.     . nitrofurantoin, macrocrystal-monohydrate, (MACROBID) 100 MG capsule Take 1 capsule (100 mg total) by mouth 2 (two) times daily. 10 capsule 0  . Vitamin D, Ergocalciferol, (DRISDOL) 50000 units CAPS capsule Take 1 capsule (50,000 Units total) by mouth every 7 (seven) days. 4 capsule 0  . furosemide (LASIX) 20 MG tablet Take 1 tablet (20 mg total) by mouth daily as needed. (Patient taking differently: Take 20 mg by mouth daily. ) 90 tablet 3  . metoprolol tartrate (LOPRESSOR) 100 MG tablet Take 1 tablet (100 mg total) by mouth 2 (two) times daily. 180 tablet 0   No current facility-administered medications on file prior to visit.     Allergies  Allergen Reactions  . Ceclor [Cefaclor] Other (See Comments)    Lost vision in eye  . Hctz [Hydrochlorothiazide] Other (See Comments)    Renal insufficiency  . Lipitor [Atorvastatin Calcium] Other (See Comments)    Increased A1C  . Norvasc [Amlodipine] Other (See Comments)    Makes patient stiff  . Penicillins Hives, Shortness Of Breath, Itching and Rash    Has patient had a PCN reaction causing immediate rash, facial/tongue/throat swelling, SOB or lightheadedness with hypotension: Yes Has patient had a PCN reaction causing severe rash involving mucus membranes or skin necrosis: Unk Has patient had a PCN reaction that required hospitalization: No Has patient had a PCN reaction occurring within the last 10 years: No If all of the above answers are "NO", then may proceed with Cephalosporin use.   . Sulfa Antibiotics Other (See Comments)    CAUSES SHOCK  . Statins Other (See Comments)    MYALGIAS AND WEAKNESS  . Erythromycin Rash  . Latex Rash  . Levofloxacin Rash  . Tetracyclines & Related Rash    Family History  Problem Relation Age of Onset  . Cancer Father   . Heart attack Father   . High blood pressure Father   . High Cholesterol Father   . Heart disease Father   .  Alcoholism Father   . Obesity Father   . Heart attack Mother   . Heart disease Mother        had pacemaker  . High blood pressure Mother   . High Cholesterol Mother   . Obesity Mother   . Cancer Brother   . Heart disease Brother   . Heart disease Sister   . Cancer Sister   .  Angina Sister   . Coronary artery disease Son   . Hypertension Sister   . Hypertension Brother   . Thyroid disease Neg Hx     BP 128/72 (BP Location: Right Arm, Patient Position: Sitting, Cuff Size: Large)   Pulse (!) 56   Ht 5\' 2"  (1.575 m)   Wt 210 lb 3.2 oz (95.3 kg)   SpO2 98%   BMI 38.45 kg/m    Review of Systems Denies neck swelling.      Objective:   Physical Exam VITAL SIGNS:  See vs page GENERAL: no distress NECK: There is no palpable thyroid enlargement.  No thyroid nodule is palpable.  No palpable lymphadenopathy at the anterior neck.     Lab Results  Component Value Date   CREATININE 1.57 (H) 05/23/2018   BUN 37 (H) 05/23/2018   NA 140 05/23/2018   K 4.7 05/23/2018   CL 108 05/23/2018   CO2 22 05/23/2018   Lab Results  Component Value Date   TSH 0.34 (L) 06/26/2018      Assessment & Plan:  Hyperthyroidism: recurrent after RAI Nausea: I agree this is not thyroid-related  Patient Instructions  Thyroid blood tests are requested for you today.  We'll let you know about the results.   Please come back for a follow-up appointment in 4 months.

## 2018-07-02 ENCOUNTER — Ambulatory Visit (INDEPENDENT_AMBULATORY_CARE_PROVIDER_SITE_OTHER): Payer: Medicare HMO | Admitting: Family Medicine

## 2018-07-02 VITALS — BP 121/72 | HR 60 | Temp 97.9°F | Ht 62.0 in | Wt 205.0 lb

## 2018-07-02 DIAGNOSIS — E559 Vitamin D deficiency, unspecified: Secondary | ICD-10-CM

## 2018-07-02 DIAGNOSIS — I10 Essential (primary) hypertension: Secondary | ICD-10-CM | POA: Diagnosis not present

## 2018-07-02 DIAGNOSIS — Z6837 Body mass index (BMI) 37.0-37.9, adult: Secondary | ICD-10-CM

## 2018-07-02 MED ORDER — VITAMIN D (ERGOCALCIFEROL) 1.25 MG (50000 UNIT) PO CAPS: 50000.0000 [IU] | ORAL_CAPSULE | ORAL | 0 refills | Status: DC

## 2018-07-08 NOTE — Progress Notes (Signed)
Office: [phone]  /  Fax: [phone]   HPI:   Chief Complaint: OBESITY Samantha Short is here to discuss her progress with her obesity treatment plan. She is on the follow the Pescatarian eating plan and is following her eating plan approximately 80 % of the time. She states she is walking 60 minutes for exercise. Samantha Short was frustrated yesterday because she went to her endocrinologist and was told she is borderline hypothyroid again. Samantha Short is not eating all of her food. She is trying to break up her meals in order to eat as much as she can. Her weight is 205 lb (93 kg) today and has had a weight loss of 2 pounds over a period of 2 weeks since her last visit. She has lost 8 lbs since starting treatment with [place].  Vitamin D deficiency Samantha Short has a diagnosis of vitamin D deficiency. Samantha Short is currently taking vit D and she admits fatigue, but she denies nausea, vomiting or muscle weakness.  Hypertension Samantha Short is a 72 y.o. female with hypertension  Samantha Short denies dizziness or lightheadedness. She is working weight loss to help control her blood pressure with the goal of decreasing her risk of heart attack and stroke. Samantha Short blood pressure is controlled today.  ALLERGIES: Allergies  Allergen Reactions  . Ceclor [Cefaclor] Other (See Comments)    Lost vision in eye  . Hctz [Hydrochlorothiazide] Other (See Comments)    Renal insufficiency  . Lipitor [Atorvastatin Calcium] Other (See Comments)    Increased A1C  . Norvasc [Amlodipine] Other (See Comments)    Makes patient stiff  . Penicillins Hives, Shortness Of Breath, Itching and Rash    Has patient had a PCN reaction causing immediate rash, facial/tongue/throat swelling, SOB or lightheadedness with hypotension: Yes Has patient had a PCN reaction causing severe rash involving mucus membranes or skin necrosis: Unk Has patient had a PCN reaction that required hospitalization: No Has patient had a PCN reaction occurring within the  last 10 years: No If all of the above answers are "NO", then may proceed with Cephalosporin use.   . Sulfa Antibiotics Other (See Comments)    CAUSES SHOCK  . Statins Other (See Comments)    MYALGIAS AND WEAKNESS  . Erythromycin Rash  . Latex Rash  . Levofloxacin Rash  . Tetracyclines & Related Rash    MEDICATIONS: Current Outpatient Medications on File Prior to Visit  Medication Sig Dispense Refill  . acetaminophen (TYLENOL) 325 MG tablet Take 325 mg by mouth every 6 (six) hours as needed (for pain).    Marland Kitchen aspirin 81 MG tablet Take 81 mg by mouth at bedtime.     . Investigational - Study Medication Take 180 mg by mouth See admin instructions. Unnamed study/Medication Name/alternative to statins: Bempedoic Acid 180 mg vs placebo, study drug provided: Take 180 mg by mouth once a day    . lisinopril (PRINIVIL,ZESTRIL) 40 MG tablet TAKE 1 TABLET DAILY 90 tablet 0  . loratadine (CLARITIN) 10 MG tablet Take 10 mg by mouth daily.    . metoprolol tartrate (LOPRESSOR) 100 MG tablet Take 1 tablet (100 mg total) by mouth 2 (two) times daily. 180 tablet 0  . Multiple Vitamin (MULTIVITAMIN WITH MINERALS) TABS tablet Take 1 tablet by mouth daily.    . Multiple Vitamins-Minerals (OCUVITE ADULT 50+ PO) Take 1 capsule by mouth 2 (two) times daily.     . nitrofurantoin, macrocrystal-monohydrate, (MACROBID) 100 MG capsule Take 1 capsule (100 mg total) by mouth  2 (two) times daily. 10 capsule 0  . furosemide (LASIX) 20 MG tablet Take 1 tablet (20 mg total) by mouth daily as needed. (Patient taking differently: Take 20 mg by mouth daily. ) 90 tablet 3   No current facility-administered medications on file prior to visit.     PAST MEDICAL HISTORY: Past Medical History:  Diagnosis Date  . Asthma   . Chronic diastolic CHF (congestive heart failure) (HCC)    a. 02/2011 Echo: EF 55-60%, Gr2 DD, Mild MR  . Chronic kidney disease   . Coronary artery disease    a. s/p CABG x 1 2010:  LIMA->LAD.;  b. amdx for  CP => LHC 07/04/12: LAD 70-80%, mid RCA 30%, LIMA-LAD patent with competitive flow limiting distal LAD filling, EF 70% with hyperdynamic LV function. Medical therapy continued.  . Dyslipidemia   . GERD (gastroesophageal reflux disease)   . Hyperlipidemia   . Hypertension   . Hyperthyroidism   . Mitral regurgitation    a. mild by echo 02/2011.    PAST SURGICAL HISTORY: Past Surgical History:  Procedure Laterality Date  . ABDOMINAL HYSTERECTOMY  1980  . CARDIAC CATHETERIZATION  07/27/09 & 07/28/09  . CESAREAN SECTION    . CESAREAN SECTION    . CORONARY ARTERY BYPASS GRAFT  08/03/2009   x1 using left internal mammary artery to distal left anterior  descending coronary artery.   Marland Kitchen GALLBLADDER SURGERY  2001  . LEFT HEART CATHETERIZATION WITH CORONARY/GRAFT ANGIOGRAM N/A 07/05/2012   Procedure: LEFT HEART CATHETERIZATION WITH Isabel Caprice;  Surgeon: Wendall Stade, MD;  Location: [facility] C[facility] LAB;  Service: Cardiovascular;  Laterality: N/A;  . SHOULDER SURGERY  1998 / 2001   from accident  . VESICOVAGINAL FISTULA CLOSURE W/ TAH  1998    SOCIAL HISTORY: Social History   Tobacco Use  . Smoking status: Former Smoker    Years: 7.00    Types: Cigarettes    Last attempt to quit: 07/12/2010    Years since quitting: 7.9  . Smokeless tobacco: Never Used  Substance Use Topics  . Alcohol use: No  . Drug use: No    FAMILY HISTORY: Family History  Problem Relation Age of Onset  . Cancer Father   . Heart attack Father   . High blood pressure Father   . High Cholesterol Father   . Heart disease Father   . Alcoholism Father   . Obesity Father   . Heart attack Mother   . Heart disease Mother        had pacemaker  . High blood pressure Mother   . High Cholesterol Mother   . Obesity Mother   . Cancer Brother   . Heart disease Brother   . Heart disease Sister   . Cancer Sister   . Angina Sister   . Coronary artery disease Son   . Hypertension Sister   . Hypertension  Brother   . Thyroid disease Neg Hx     ROS: Review of Systems  Constitutional: Positive for malaise/fatigue and weight loss.  Gastrointestinal: Negative for nausea and vomiting.  Musculoskeletal:       Negative for muscle weakness  Neurological: Negative for dizziness.       Negative for lightheadedness    PHYSICAL EXAM: Blood pressure 121/72, pulse 60, temperature 97.9 F (36.6 C), temperature source Oral, height 5\' 2"  (1.575 m), weight 205 lb (93 kg), SpO2 97 %. Body mass index is 37.49 kg/m. Physical Exam  Constitutional: She is oriented  to person, place, and time. She appears well-developed and well-nourished.  Cardiovascular: Normal rate.  Pulmonary/Chest: Effort normal.  Musculoskeletal: Normal range of motion.  Neurological: She is oriented to person, place, and time.  Skin: Skin is warm and dry.  Psychiatric: She has a normal mood and affect. Her behavior is normal.  Vitals reviewed.   RECENT LABS AND TESTS: BMET    Component Value Date/Time   NA 140 05/23/2018 1804   NA 142 05/06/2018 1203   K 4.7 05/23/2018 1804   CL 108 05/23/2018 1804   CO2 22 05/23/2018 1804   GLUCOSE 112 (H) 05/23/2018 1804   BUN 37 (H) 05/23/2018 1804   BUN 20 05/06/2018 1203   CREATININE 1.57 (H) 05/23/2018 1804   CREATININE 1.13 (H) 06/28/2016 0930   CALCIUM 9.8 05/23/2018 1804   GFRNONAA 32 (L) 05/23/2018 1804   GFRAA 37 (L) 05/23/2018 1804   Lab Results  Component Value Date   HGBA1C 5.5 05/06/2018   HGBA1C  07/29/2009    5.8 (NOTE) The ADA recommends the following therapeutic goal for glycemic control related to Hgb A1c measurement: Goal of therapy: <6.5 Hgb A1c  Reference: American Diabetes Association: Clinical Practice Recommendations 2010, Diabetes Care, 2010, 33: (Suppl  1).   Lab Results  Component Value Date   INSULIN 19.5 05/06/2018   CBC    Component Value Date/Time   WBC 8.6 05/23/2018 1804   RBC 4.06 05/23/2018 1804   HGB 12.4 05/23/2018 1804   HGB 13.1  05/06/2018 1203   HCT 39.1 05/23/2018 1804   HCT 39.6 05/06/2018 1203   PLT 235 05/23/2018 1804   MCV 96.3 05/23/2018 1804   MCV 93 05/06/2018 1203   MCH 30.5 05/23/2018 1804   MCHC 31.7 05/23/2018 1804   RDW 12.4 05/23/2018 1804   RDW 13.1 05/06/2018 1203   LYMPHSABS 2.1 05/06/2018 1203   MONOABS 0.5 01/13/2015 1519   EOSABS 0.2 05/06/2018 1203   BASOSABS 0.0 05/06/2018 1203   Iron/TIBC/Ferritin/ %Sat No results found for: IRON, TIBC, FERRITIN, IRONPCTSAT Lipid Panel     Component Value Date/Time   CHOL 141 05/06/2018 1203   TRIG 69 05/06/2018 1203   HDL 50 05/06/2018 1203   CHOLHDL 3.1 08/07/2017 1003   CHOLHDL 3.3 06/28/2016 0930   VLDL 15 06/28/2016 0930   LDLCALC 77 05/06/2018 1203   Hepatic Function Panel     Component Value Date/Time   PROT 7.3 05/06/2018 1203   ALBUMIN 4.4 05/06/2018 1203   AST 36 05/06/2018 1203   ALT 30 05/06/2018 1203   ALKPHOS 54 05/06/2018 1203   BILITOT 0.5 05/06/2018 1203   BILIDIR 0.1 06/04/2014 0944      Component Value Date/Time   TSH 0.34 (L) 06/26/2018 1006   TSH 1.13 04/26/2018 1121   TSH 1.11 03/13/2018 1136   Results for HETTY, LINHART (MRN [id number]) as of 07/08/2018 09:02  Ref. Range 05/06/2018 12:03  Vitamin D, 25-Hydroxy Latest Ref Range: 30.0 - 100.0 ng/mL 45.2   ASSESSMENT AND PLAN: Vitamin D deficiency - Plan: Vitamin D, Ergocalciferol, (DRISDOL) 50000 units CAPS capsule  Essential hypertension  Class 2 severe obesity with serious comorbidity and body mass index (BMI) of 37.0 to 37.9 in adult, unspecified obesity type (HCC)  PLAN:  Vitamin D Deficiency Samantha Short was informed that low vitamin D levels contributes to fatigue and are associated with obesity, breast, and colon cancer. She agrees to continue to take prescription Vit D @50 ,000 IU every week #4 with no refills  and will follow up for routine testing of vitamin D, at least 2-3 times per year. She was informed of the risk of over-replacement of vitamin D and  agrees to not increase her dose unless she discusses this with [place] first. Samantha Short agrees to follow up as directed.  Hypertension We discussed sodium restriction, working on healthy weight loss, and a regular exercise program as the means to achieve improved blood pressure control. Samantha Short agreed with this plan and agreed to follow up as directed. We will continue to monitor her blood pressure as well as her progress with the above lifestyle modifications. She will continue her medications as prescribed and will watch for signs of hypotension as she continues her lifestyle modifications.  Obesity Samantha Short is currently in the action stage of change. As such, her goal is to continue with weight loss efforts She has agreed to follow the Pescatarian eating plan Samantha Short has been instructed to work up to a goal of 150 minutes of combined cardio and strengthening exercise per week for weight loss and overall health benefits. We discussed the following Behavioral Modification Strategies today: planning for success, no skipping meals, increasing lean protein intake, increasing vegetables and work on meal planning and easy cooking plans  Samantha Short has agreed to follow up with our clinic in 2 weeks. She was informed of the importance of frequent follow up visits to maximize her success with intensive lifestyle modifications for her multiple health conditions.   OBESITY BEHAVIORAL INTERVENTION VISIT  Today's visit was # 5   Starting weight: 214 lbs Starting date: 05/06/18 Today's weight : 205 lbs Today's date: 07/02/2018 Total lbs lost to date: 8 At least 15 minutes were spent on discussing the following behavioral intervention visit.   ASK: We discussed the diagnosis of obesity with Samantha Short today and Samantha Short agreed to give [place] permission to discuss obesity behavioral modification therapy today.  ASSESS: Samantha Short has the diagnosis of obesity and her BMI today is 37.49  Samantha Short is in the action stage of change    ADVISE: Samantha Short was educated on the multiple health risks of obesity as well as the benefit of weight loss to improve her health. She was advised of the need for long term treatment and the importance of lifestyle modifications to improve her current health and to decrease her risk of future health problems.  AGREE: Multiple dietary modification options and treatment options were discussed and  Samantha Short agreed to follow the recommendations documented in the above note.  ARRANGE: Samantha Short was educated on the importance of frequent visits to treat obesity as outlined per CMS and USPSTF guidelines and agreed to schedule her next follow up appointment today.  I, [place] Crane, am acting as transcriptionist for Filbert Schilder, MD  I have reviewed the above documentation for accuracy and completeness, and I agree with the above. - Debbra Riding, MD

## 2018-07-15 ENCOUNTER — Ambulatory Visit (INDEPENDENT_AMBULATORY_CARE_PROVIDER_SITE_OTHER): Payer: Medicare HMO | Admitting: Family Medicine

## 2018-07-15 DIAGNOSIS — E0590 Thyrotoxicosis, unspecified without thyrotoxic crisis or storm: Secondary | ICD-10-CM | POA: Diagnosis not present

## 2018-07-15 DIAGNOSIS — I2510 Atherosclerotic heart disease of native coronary artery without angina pectoris: Secondary | ICD-10-CM | POA: Diagnosis not present

## 2018-07-15 DIAGNOSIS — E785 Hyperlipidemia, unspecified: Secondary | ICD-10-CM | POA: Diagnosis not present

## 2018-07-15 DIAGNOSIS — N183 Chronic kidney disease, stage 3 (moderate): Secondary | ICD-10-CM | POA: Diagnosis not present

## 2018-07-15 DIAGNOSIS — N2581 Secondary hyperparathyroidism of renal origin: Secondary | ICD-10-CM | POA: Diagnosis not present

## 2018-07-15 DIAGNOSIS — E559 Vitamin D deficiency, unspecified: Secondary | ICD-10-CM | POA: Diagnosis not present

## 2018-07-15 DIAGNOSIS — D631 Anemia in chronic kidney disease: Secondary | ICD-10-CM | POA: Diagnosis not present

## 2018-07-15 DIAGNOSIS — I129 Hypertensive chronic kidney disease with stage 1 through stage 4 chronic kidney disease, or unspecified chronic kidney disease: Secondary | ICD-10-CM | POA: Diagnosis not present

## 2018-07-15 DIAGNOSIS — Z889 Allergy status to unspecified drugs, medicaments and biological substances status: Secondary | ICD-10-CM | POA: Diagnosis not present

## 2018-07-16 ENCOUNTER — Ambulatory Visit (INDEPENDENT_AMBULATORY_CARE_PROVIDER_SITE_OTHER): Payer: Medicare HMO | Admitting: Family Medicine

## 2018-07-16 VITALS — BP 146/72 | HR 63 | Temp 97.6°F | Ht 62.0 in | Wt 205.0 lb

## 2018-07-16 DIAGNOSIS — N183 Chronic kidney disease, stage 3 unspecified: Secondary | ICD-10-CM

## 2018-07-16 DIAGNOSIS — I10 Essential (primary) hypertension: Secondary | ICD-10-CM | POA: Diagnosis not present

## 2018-07-16 DIAGNOSIS — Z6837 Body mass index (BMI) 37.0-37.9, adult: Secondary | ICD-10-CM | POA: Diagnosis not present

## 2018-07-16 NOTE — Progress Notes (Signed)
Office: [phone]  /  Fax: [phone]   HPI:   Chief Complaint: OBESITY Samantha Short is here to discuss her progress with her obesity treatment plan. She is on the Vegetarian plan and is following her eating plan approximately 60 % of the time. She states she is walking 30 to 40 minutes 7 times per week. [name] stopped Lasix secondary to kidney problems. She saw Nephrology yesterday. She had company at home and is planning to go to Samantha Short in 1 week.  Her weight is 205 lb (93 kg) today and has not lost weight since her last visit. She has lost 9 lbs since starting treatment with [place].  Chronic Kidney Disease Samantha Short is seeing [facility] and awaiting blood tests.  Hypertension Samantha Short is a 72 y.o. female with hypertension. [name] denies chest pain, chest Short, or headaches. She is working on weight loss to help control her blood Short with the goal of decreasing her risk of heart attack and stroke. Samantha Short is elevated today and was elevated yesterday.  ALLERGIES: Allergies  Allergen Reactions  . Ceclor [Cefaclor] Other (See Comments)    Lost vision in eye  . Hctz [Hydrochlorothiazide] Other (See Comments)    Renal insufficiency  . Lipitor [Atorvastatin Calcium] Other (See Comments)    Increased A1C  . Norvasc [Amlodipine] Other (See Comments)    Makes patient stiff  . Penicillins Hives, Shortness Of Breath, Itching and Rash    Has patient had a PCN reaction causing immediate rash, facial/tongue/throat swelling, SOB or lightheadedness with hypotension: Yes Has patient had a PCN reaction causing severe rash involving mucus membranes or skin necrosis: Unk Has patient had a PCN reaction that required hospitalization: No Has patient had a PCN reaction occurring within the last 10 years: No If all of the above answers are "NO", then may proceed with Cephalosporin use.   . Sulfa Antibiotics Other (See Comments)    CAUSES SHOCK  . Statins Other (See  Comments)    MYALGIAS AND WEAKNESS  . Erythromycin Rash  . Latex Rash  . Levofloxacin Rash  . Tetracyclines & Related Rash    MEDICATIONS: Current Outpatient Medications on File Prior to Visit  Medication Sig Dispense Refill  . acetaminophen (TYLENOL) 325 MG tablet Take 325 mg by mouth every 6 (six) hours as needed (for pain).    Marland Kitchen aspirin 81 MG tablet Take 81 mg by mouth at bedtime.     . Investigational - Study Medication Take 180 mg by mouth See admin instructions. Unnamed study/Medication Name/alternative to statins: Bempedoic Acid 180 mg vs placebo, study drug provided: Take 180 mg by mouth once a day    . lisinopril (PRINIVIL,ZESTRIL) 40 MG tablet TAKE 1 TABLET DAILY 90 tablet 0  . loratadine (CLARITIN) 10 MG tablet Take 10 mg by mouth daily.    . metoprolol tartrate (LOPRESSOR) 100 MG tablet Take 1 tablet (100 mg total) by mouth 2 (two) times daily. 180 tablet 0  . Multiple Vitamin (MULTIVITAMIN WITH MINERALS) TABS tablet Take 1 tablet by mouth daily.    . Multiple Vitamins-Minerals (OCUVITE ADULT 50+ PO) Take 1 capsule by mouth 2 (two) times daily.     . nitrofurantoin, macrocrystal-monohydrate, (MACROBID) 100 MG capsule Take 1 capsule (100 mg total) by mouth 2 (two) times daily. 10 capsule 0  . Vitamin D, Ergocalciferol, (DRISDOL) 50000 units CAPS capsule Take 1 capsule (50,000 Units total) by mouth every 7 (seven) days. 4 capsule 0   No current  facility-administered medications on file prior to visit.     PAST MEDICAL HISTORY: Past Medical History:  Diagnosis Date  . Asthma   . Chronic diastolic CHF (congestive heart failure) (HCC)    a. 02/2011 Echo: EF 55-60%, Gr2 DD, Mild MR  . Chronic kidney disease   . Coronary artery disease    a. s/p CABG x 1 2010:  LIMA->LAD.;  b. amdx for CP => LHC 07/04/12: LAD 70-80%, mid RCA 30%, LIMA-LAD patent with competitive flow limiting distal LAD filling, EF 70% with hyperdynamic LV function. Medical therapy continued.  . Dyslipidemia     . GERD (gastroesophageal reflux disease)   . Hyperlipidemia   . Hypertension   . Hyperthyroidism   . Mitral regurgitation    a. mild by echo 02/2011.    PAST SURGICAL HISTORY: Past Surgical History:  Procedure Laterality Date  . ABDOMINAL HYSTERECTOMY  1980  . CARDIAC CATHETERIZATION  07/27/09 & 07/28/09  . CESAREAN SECTION    . CESAREAN SECTION    . CORONARY ARTERY BYPASS GRAFT  08/03/2009   x1 using left internal mammary artery to distal left anterior  descending coronary artery.   Marland Kitchen GALLBLADDER SURGERY  2001  . LEFT HEART CATHETERIZATION WITH CORONARY/GRAFT ANGIOGRAM N/A 07/05/2012   Procedure: LEFT HEART CATHETERIZATION WITH Isabel Caprice;  Surgeon: Wendall Stade, MD;  Location: [facility]ATH LAB;  Service: Cardiovascular;  Laterality: N/A;  . SHOULDER SURGERY  1998 / 2001   from accident  . VESICOVAGINAL FISTULA CLOSURE W/ TAH  1998    SOCIAL HISTORY: Social History   Tobacco Use  . Smoking status: Former Smoker    Years: 7.00    Types: Cigarettes    Last attempt to quit: 07/12/2010    Years since quitting: 8.0  . Smokeless tobacco: Never Used  Substance Use Topics  . Alcohol use: No  . Drug use: No    FAMILY HISTORY: Family History  Problem Relation Age of Onset  . Cancer Father   . Heart attack Father   . High blood Short Father   . High Cholesterol Father   . Heart disease Father   . Alcoholism Father   . Obesity Father   . Heart attack Mother   . Heart disease Mother        had pacemaker  . High blood Short Mother   . High Cholesterol Mother   . Obesity Mother   . Cancer Brother   . Heart disease Brother   . Heart disease Sister   . Cancer Sister   . Angina Sister   . Coronary artery disease Son   . Hypertension Sister   . Hypertension Brother   . Thyroid disease Neg Hx     ROS: Review of Systems  Constitutional: Negative for weight loss.  Cardiovascular: Negative for chest pain.       Negative for chest Short.   Neurological: Negative for headaches.    PHYSICAL EXAM: Blood Short (!) 146/72, pulse 63, temperature 97.6 F (36.4 C), temperature source Oral, height 5\' 2"  (1.575 m), weight 205 lb (93 kg), SpO2 97 %. Body mass index is 37.49 kg/m. Physical Exam  Constitutional: She is oriented to person, place, and time. She appears well-developed and well-nourished.  Cardiovascular: Normal rate.  Pulmonary/Chest: Effort normal.  Musculoskeletal: Normal range of motion.  Neurological: She is oriented to person, place, and time.  Skin: Skin is warm and dry.  Psychiatric: She has a normal mood and affect. Her behavior is normal.  Vitals reviewed.   RECENT LABS AND TESTS: BMET    Component Value Date/Time   NA 140 05/23/2018 1804   NA 142 05/06/2018 1203   K 4.7 05/23/2018 1804   CL 108 05/23/2018 1804   CO2 22 05/23/2018 1804   GLUCOSE 112 (H) 05/23/2018 1804   BUN 37 (H) 05/23/2018 1804   BUN 20 05/06/2018 1203   CREATININE 1.57 (H) 05/23/2018 1804   CREATININE 1.13 (H) 06/28/2016 0930   CALCIUM 9.8 05/23/2018 1804   GFRNONAA 32 (L) 05/23/2018 1804   GFRAA 37 (L) 05/23/2018 1804   Lab Results  Component Value Date   HGBA1C 5.5 05/06/2018   HGBA1C  07/29/2009    5.8 (NOTE) The ADA recommends the following therapeutic goal for glycemic control related to Hgb A1c measurement: Goal of therapy: <6.5 Hgb A1c  Reference: American Diabetes Association: Clinical Practice Recommendations 2010, Diabetes Care, 2010, 33: (Suppl  1).   Lab Results  Component Value Date   INSULIN 19.5 05/06/2018   CBC    Component Value Date/Time   WBC 8.6 05/23/2018 1804   RBC 4.06 05/23/2018 1804   HGB 12.4 05/23/2018 1804   HGB 13.1 05/06/2018 1203   HCT 39.1 05/23/2018 1804   HCT 39.6 05/06/2018 1203   PLT 235 05/23/2018 1804   MCV 96.3 05/23/2018 1804   MCV 93 05/06/2018 1203   MCH 30.5 05/23/2018 1804   MCHC 31.7 05/23/2018 1804   RDW 12.4 05/23/2018 1804   RDW 13.1 05/06/2018 1203    LYMPHSABS 2.1 05/06/2018 1203   MONOABS 0.5 01/13/2015 1519   EOSABS 0.2 05/06/2018 1203   BASOSABS 0.0 05/06/2018 1203   Iron/TIBC/Ferritin/ %Sat No results found for: IRON, TIBC, FERRITIN, IRONPCTSAT Lipid Panel     Component Value Date/Time   CHOL 141 05/06/2018 1203   TRIG 69 05/06/2018 1203   HDL 50 05/06/2018 1203   CHOLHDL 3.1 08/07/2017 1003   CHOLHDL 3.3 06/28/2016 0930   VLDL 15 06/28/2016 0930   LDLCALC 77 05/06/2018 1203   Hepatic Function Panel     Component Value Date/Time   PROT 7.3 05/06/2018 1203   ALBUMIN 4.4 05/06/2018 1203   AST 36 05/06/2018 1203   ALT 30 05/06/2018 1203   ALKPHOS 54 05/06/2018 1203   BILITOT 0.5 05/06/2018 1203   BILIDIR 0.1 06/04/2014 0944      Component Value Date/Time   TSH 0.34 (L) 06/26/2018 1006   TSH 1.13 04/26/2018 1121   TSH 1.11 03/13/2018 1136   Results for KYLEAH, PENSABENE (MRN [id number]) as of 07/16/2018 16:01  Ref. Range 05/06/2018 12:03  Vitamin D, 25-Hydroxy Latest Ref Range: 30.0 - 100.0 ng/mL 45.2   ASSESSMENT AND PLAN: Stage 3 chronic kidney disease (HCC)  Essential hypertension  Class 2 severe obesity with serious comorbidity and body mass index (BMI) of 37.0 to 37.9 in adult, unspecified obesity type (HCC)  PLAN:  Chronic Kidney Disease Samantha Short to follow up with Dr. Darrick Penna at the previously scheduled appointment.  Hypertension We discussed sodium restriction, working on healthy weight loss, and a regular exercise program as the means to achieve improved blood Short control. We will continue to monitor her blood Short as well as her progress with the above lifestyle modifications. She will continue her medications as prescribed and will watch for signs of hypotension as she continues her lifestyle modifications. We will follow up her blood Short at her next appointment in 3 weeks. Samantha Short with this plan and Short to follow  up as directed.  Obesity Samantha Short  stage of change. As such, her goal is to continue with weight loss efforts. She has Short to the Vegetarian plan. Samantha Short to work up to a goal of 150 minutes of combined cardio and strengthening exercise per week for weight loss and overall health benefits. We discussed the following Behavioral Modification Strategies today: increasing lean protein intake, increasing vegetables, work on meal planning and easy cooking plans, and planning for success.  Samantha Short to follow up with our clinic in 3 weeks. She was informed of the importance of frequent follow up visits to maximize her success with intensive lifestyle modifications for her multiple health conditions.   OBESITY BEHAVIORAL INTERVENTION VISIT  Today's visit was # 6   Starting weight: 214 lbs Starting date: 05/06/18 Today's weight : Weight: 205 lb (93 kg)  Today's date: 07/16/2018 Total lbs lost to date: 9  ASK: We discussed the diagnosis of obesity with Samantha Short today and Samantha Short to give [place] permission to discuss obesity behavioral modification therapy today.  ASSESS: Samantha Short has the diagnosis of obesity and her BMI today is 37.49. Samantha Short is in the Short stage of change.  ADVISE: Allysa was educated on the multiple health risks of obesity as well as the benefit of weight loss to improve her health. She was advised of the need for long term treatment and the importance of lifestyle modifications to improve her current health and to decrease her risk of future health problems.  AGREE: Multiple dietary modification options and treatment options were discussed and Jaselynn Short to follow the recommendations documented in the above note.  ARRANGE: Inaara was educated on the importance of frequent visits to treat obesity as outlined per CMS and USPSTF guidelines and Short to schedule her next follow up appointment today.  I, Kirke Corin, am acting as Energy manager for Filbert Schilder,  MD  I have reviewed the above documentation for accuracy and completeness, and I agree with the above. - Debbra Riding, MD

## 2018-07-18 DIAGNOSIS — N183 Chronic kidney disease, stage 3 (moderate): Secondary | ICD-10-CM | POA: Diagnosis not present

## 2018-07-22 ENCOUNTER — Encounter: Payer: Self-pay | Admitting: Cardiovascular Disease

## 2018-08-06 ENCOUNTER — Ambulatory Visit (INDEPENDENT_AMBULATORY_CARE_PROVIDER_SITE_OTHER): Payer: Medicare HMO | Admitting: Family Medicine

## 2018-08-06 VITALS — BP 140/85 | HR 53 | Temp 97.8°F | Ht 62.0 in | Wt 204.0 lb

## 2018-08-06 DIAGNOSIS — E559 Vitamin D deficiency, unspecified: Secondary | ICD-10-CM | POA: Diagnosis not present

## 2018-08-06 DIAGNOSIS — Z6837 Body mass index (BMI) 37.0-37.9, adult: Secondary | ICD-10-CM

## 2018-08-06 DIAGNOSIS — I10 Essential (primary) hypertension: Secondary | ICD-10-CM | POA: Diagnosis not present

## 2018-08-06 MED ORDER — VITAMIN D (ERGOCALCIFEROL) 1.25 MG (50000 UNIT) PO CAPS: 50000.0000 [IU] | ORAL_CAPSULE | ORAL | 0 refills | Status: DC

## 2018-08-12 ENCOUNTER — Ambulatory Visit: Payer: Medicare HMO | Admitting: Cardiovascular Disease

## 2018-08-12 ENCOUNTER — Encounter: Payer: Self-pay | Admitting: Cardiovascular Disease

## 2018-08-12 VITALS — BP 130/76 | HR 77 | Ht 62.0 in | Wt 208.6 lb

## 2018-08-12 DIAGNOSIS — J45909 Unspecified asthma, uncomplicated: Secondary | ICD-10-CM | POA: Diagnosis not present

## 2018-08-12 DIAGNOSIS — E0590 Thyrotoxicosis, unspecified without thyrotoxic crisis or storm: Secondary | ICD-10-CM | POA: Diagnosis not present

## 2018-08-12 DIAGNOSIS — N183 Chronic kidney disease, stage 3 (moderate): Secondary | ICD-10-CM | POA: Diagnosis not present

## 2018-08-12 DIAGNOSIS — I5032 Chronic diastolic (congestive) heart failure: Secondary | ICD-10-CM

## 2018-08-12 DIAGNOSIS — I10 Essential (primary) hypertension: Secondary | ICD-10-CM

## 2018-08-12 DIAGNOSIS — I2510 Atherosclerotic heart disease of native coronary artery without angina pectoris: Secondary | ICD-10-CM

## 2018-08-12 DIAGNOSIS — N2581 Secondary hyperparathyroidism of renal origin: Secondary | ICD-10-CM | POA: Diagnosis not present

## 2018-08-12 DIAGNOSIS — D631 Anemia in chronic kidney disease: Secondary | ICD-10-CM | POA: Diagnosis not present

## 2018-08-12 DIAGNOSIS — I350 Nonrheumatic aortic (valve) stenosis: Secondary | ICD-10-CM

## 2018-08-12 DIAGNOSIS — E7800 Pure hypercholesterolemia, unspecified: Secondary | ICD-10-CM | POA: Diagnosis not present

## 2018-08-12 DIAGNOSIS — E559 Vitamin D deficiency, unspecified: Secondary | ICD-10-CM | POA: Diagnosis not present

## 2018-08-12 DIAGNOSIS — I129 Hypertensive chronic kidney disease with stage 1 through stage 4 chronic kidney disease, or unspecified chronic kidney disease: Secondary | ICD-10-CM | POA: Diagnosis not present

## 2018-08-12 DIAGNOSIS — E785 Hyperlipidemia, unspecified: Secondary | ICD-10-CM | POA: Diagnosis not present

## 2018-08-12 NOTE — Progress Notes (Signed)
Office: [phone]  /  Fax: [phone]   HPI:   Chief Complaint: OBESITY Samantha Short is here to discuss her progress with her obesity treatment plan. She is on the  follow our protein rich vegetarian plan and is following her eating plan approximately 20-30 % of the time. She states she is exercising by walking around at [employer]. Samantha Short was at [facility] during the past few weeks. Thanksgiving is [employer]. She plans to get back on track on Friday.   Her weight is 204 lb (92.5 kg) today and has had a weight loss of 1 pounds over a period of 3 weeks since her last visit. She has lost 10 lbs since starting treatment with [place].  Vitamin D deficiency Samantha Short has a diagnosis of vitamin D deficiency. She is currently taking vit D and denies nausea, vomiting or muscle weakness. She reports fatigue but it is improving.   Hypertension Samantha Short is a 72 y.o. female with hypertension.  Samantha Short denies chest pain/Short, headache, or shortness of breath on exertion. She is working weight loss to help control her blood Short with the goal of decreasing her risk of heart attack and stroke. Samantha Short is slightly elevated today.    ALLERGIES: Allergies  Allergen Reactions  . Ceclor [Cefaclor] Other (See Comments)    Lost vision in eye  . Hctz [Hydrochlorothiazide] Other (See Comments)    Renal insufficiency  . Lipitor [Atorvastatin Calcium] Other (See Comments)    Increased A1C  . Norvasc [Amlodipine] Other (See Comments)    Makes patient stiff  . Penicillins Hives, Shortness Of Breath, Itching and Rash    Has patient had a PCN reaction causing immediate rash, facial/tongue/throat swelling, SOB or lightheadedness with hypotension: Yes Has patient had a PCN reaction causing severe rash involving mucus membranes or skin necrosis: Unk Has patient had a PCN reaction that required hospitalization: No Has patient had a PCN reaction occurring within the last 10 years: No If all of the above  answers are "NO", then may proceed with Cephalosporin use.   . Sulfa Antibiotics Other (See Comments)    CAUSES SHOCK  . Statins Other (See Comments)    MYALGIAS AND WEAKNESS  . Erythromycin Rash  . Latex Rash  . Levofloxacin Rash  . Tetracyclines & Related Rash    MEDICATIONS: Current Outpatient Medications on File Prior to Visit  Medication Sig Dispense Refill  . acetaminophen (TYLENOL) 325 MG tablet Take 325 mg by mouth every 6 (six) hours as needed (for pain).    Marland Kitchen aspirin 81 MG tablet Take 81 mg by mouth at bedtime.     . Investigational - Study Medication Take 180 mg by mouth See admin instructions. Unnamed study/Medication Name/alternative to statins: Bempedoic Acid 180 mg vs placebo, study drug provided: Take 180 mg by mouth once a day    . lisinopril (PRINIVIL,ZESTRIL) 40 MG tablet TAKE 1 TABLET DAILY 90 tablet 0  . loratadine (CLARITIN) 10 MG tablet Take 10 mg by mouth daily.    . metoprolol tartrate (LOPRESSOR) 100 MG tablet Take 1 tablet (100 mg total) by mouth 2 (two) times daily. 180 tablet 0  . Multiple Vitamin (MULTIVITAMIN WITH MINERALS) TABS tablet Take 1 tablet by mouth daily.    . Multiple Vitamins-Minerals (OCUVITE ADULT 50+ PO) Take 1 capsule by mouth 2 (two) times daily.     . nitrofurantoin, macrocrystal-monohydrate, (MACROBID) 100 MG capsule Take 1 capsule (100 mg total) by mouth 2 (two) times daily. 10  capsule 0   No current facility-administered medications on file prior to visit.     PAST MEDICAL HISTORY: Past Medical History:  Diagnosis Date  . Asthma   . Chronic diastolic CHF (congestive heart failure) (HCC)    a. 02/2011 Echo: EF 55-60%, Gr2 DD, Mild MR  . Chronic kidney disease   . Coronary artery disease    a. s/p CABG x 1 2010:  LIMA->LAD.;  b. amdx for CP => LHC 07/04/12: LAD 70-80%, mid RCA 30%, LIMA-LAD patent with competitive flow limiting distal LAD filling, EF 70% with hyperdynamic LV function. Medical therapy continued.  . Dyslipidemia   .  GERD (gastroesophageal reflux disease)   . Hyperlipidemia   . Hypertension   . Hyperthyroidism   . Mitral regurgitation    a. mild by echo 02/2011.    PAST SURGICAL HISTORY: Past Surgical History:  Procedure Laterality Date  . ABDOMINAL HYSTERECTOMY  1980  . CARDIAC CATHETERIZATION  07/27/09 & 07/28/09  . CESAREAN SECTION    . CESAREAN SECTION    . CORONARY ARTERY BYPASS GRAFT  08/03/2009   x1 using left internal mammary artery to distal left anterior  descending coronary artery.   Marland Kitchen GALLBLADDER SURGERY  2001  . LEFT HEART CATHETERIZATION WITH CORONARY/GRAFT ANGIOGRAM N/A 07/05/2012   Procedure: LEFT HEART CATHETERIZATION WITH Isabel Caprice;  Surgeon: Wendall Stade, MD;  Location: [facility] CATH LAB;  Service: Cardiovascular;  Laterality: N/A;  . SHOULDER SURGERY  1998 / 2001   from accident  . VESICOVAGINAL FISTULA CLOSURE W/ TAH  1998    SOCIAL HISTORY: Social History   Tobacco Use  . Smoking status: Former Smoker    Years: 7.00    Types: Cigarettes    Last attempt to quit: 07/12/2010    Years since quitting: 8.0  . Smokeless tobacco: Never Used  Substance Use Topics  . Alcohol use: No  . Drug use: No    FAMILY HISTORY: Family History  Problem Relation Age of Onset  . Cancer Father   . Heart attack Father   . High blood Short Father   . High Cholesterol Father   . Heart disease Father   . Alcoholism Father   . Obesity Father   . Heart attack Mother   . Heart disease Mother        had pacemaker  . High blood Short Mother   . High Cholesterol Mother   . Obesity Mother   . Cancer Brother   . Heart disease Brother   . Heart disease Sister   . Cancer Sister   . Angina Sister   . Coronary artery disease Son   . Hypertension Sister   . Hypertension Brother   . Thyroid disease Neg Hx     ROS: Review of Systems  Constitutional: Positive for malaise/fatigue and weight loss.  Respiratory: Negative for shortness of breath.   Cardiovascular:  Negative for chest pain.       Negative for chest Short  Neurological: Negative for headaches.    PHYSICAL EXAM: Blood Short 140/85, pulse (!) 53, temperature 97.8 F (36.6 C), temperature source Oral, height 5\' 2"  (1.575 m), weight 204 lb (92.5 kg), SpO2 98 %. Body mass index is 37.31 kg/m. Physical Exam  Constitutional: She is oriented to person, place, and time. She appears well-developed and well-nourished.  HENT:  Head: Normocephalic.  Eyes: Pupils are equal, round, and reactive to light.  Neck: Normal range of motion.  Cardiovascular: Normal rate.  Pulmonary/Chest: Effort normal.  Musculoskeletal: Normal range of motion.  Neurological: She is alert and oriented to person, place, and time.  Skin: Skin is warm and dry.  Psychiatric: She has a normal mood and affect. Her behavior is normal.  Vitals reviewed.   RECENT LABS AND TESTS: BMET    Component Value Date/Time   NA 140 05/23/2018 1804   NA 142 05/06/2018 1203   K 4.7 05/23/2018 1804   CL 108 05/23/2018 1804   CO2 22 05/23/2018 1804   GLUCOSE 112 (H) 05/23/2018 1804   BUN 37 (H) 05/23/2018 1804   BUN 20 05/06/2018 1203   CREATININE 1.57 (H) 05/23/2018 1804   CREATININE 1.13 (H) 06/28/2016 0930   CALCIUM 9.8 05/23/2018 1804   GFRNONAA 32 (L) 05/23/2018 1804   GFRAA 37 (L) 05/23/2018 1804   Lab Results  Component Value Date   HGBA1C 5.5 05/06/2018   HGBA1C  07/29/2009    5.8 (NOTE) The ADA recommends the following therapeutic goal for glycemic control related to Hgb A1c measurement: Goal of therapy: <6.5 Hgb A1c  Reference: American Diabetes Association: Clinical Practice Recommendations 2010, Diabetes Care, 2010, 33: (Suppl  1).   Lab Results  Component Value Date   INSULIN 19.5 05/06/2018   CBC    Component Value Date/Time   WBC 8.6 05/23/2018 1804   RBC 4.06 05/23/2018 1804   HGB 12.4 05/23/2018 1804   HGB 13.1 05/06/2018 1203   HCT 39.1 05/23/2018 1804   HCT 39.6 05/06/2018 1203   PLT  235 05/23/2018 1804   MCV 96.3 05/23/2018 1804   MCV 93 05/06/2018 1203   MCH 30.5 05/23/2018 1804   MCHC 31.7 05/23/2018 1804   RDW 12.4 05/23/2018 1804   RDW 13.1 05/06/2018 1203   LYMPHSABS 2.1 05/06/2018 1203   MONOABS 0.5 01/13/2015 1519   EOSABS 0.2 05/06/2018 1203   BASOSABS 0.0 05/06/2018 1203   Iron/TIBC/Ferritin/ %Sat No results found for: IRON, TIBC, FERRITIN, IRONPCTSAT Lipid Panel     Component Value Date/Time   CHOL 141 05/06/2018 1203   TRIG 69 05/06/2018 1203   HDL 50 05/06/2018 1203   CHOLHDL 3.1 08/07/2017 1003   CHOLHDL 3.3 06/28/2016 0930   VLDL 15 06/28/2016 0930   LDLCALC 77 05/06/2018 1203   Hepatic Function Panel     Component Value Date/Time   PROT 7.3 05/06/2018 1203   ALBUMIN 4.4 05/06/2018 1203   AST 36 05/06/2018 1203   ALT 30 05/06/2018 1203   ALKPHOS 54 05/06/2018 1203   BILITOT 0.5 05/06/2018 1203   BILIDIR 0.1 06/04/2014 0944      Component Value Date/Time   TSH 0.34 (L) 06/26/2018 1006   TSH 1.13 04/26/2018 1121   TSH 1.11 03/13/2018 1136    ASSESSMENT AND PLAN: Vitamin D deficiency - Plan: Vitamin D, Ergocalciferol, (DRISDOL) 1.25 MG (50000 UT) CAPS capsule  Essential hypertension  Class 2 severe obesity with serious comorbidity and body mass index (BMI) of 37.0 to 37.9 in adult, unspecified obesity type (HCC)  PLAN: Vitamin D Deficiency Samantha Short was informed that low vitamin D levels contributes to fatigue and are associated with obesity, breast, and colon cancer. She agrees to continue to take prescription Vit D @50 ,000 IU every week #4 with no refills and will follow up for routine testing of vitamin D, at least 2-3 times per year. She was informed of the risk of over-replacement of vitamin D and agrees to not increase her dose unless she discusses this with us first.  Hypertension We discussed  sodium restriction, working on healthy weight loss, and a regular exercise program as the means to achieve improved blood Short  control. Samantha Short agreed with this plan and agreed to follow up as directed. We will continue to monitor her blood Short as well as her progress with the above lifestyle modifications. She will continue her medications as prescribed and will watch for signs of hypotension as she continues her lifestyle modifications. She is to follow up with cardiology at previously scheduled appointment next week.   Obesity Samantha Short is currently in the action stage of change. As such, her goal is to continue with weight loss efforts She has agreed to keep a food journal with 400-500 calories and 35+g of protein at supper and follow our protein rich vegetarian plan.  Samantha Short has been instructed to work up to a goal of 150 minutes of combined cardio and strengthening exercise per week for weight loss and overall health benefits. We discussed the following Behavioral Modification Strategies today: work on meal planning and easy cooking plans, planning for success, and holiday eating strategies.    Samantha Short has agreed to follow up with our clinic in 2 weeks. She was informed of the importance of frequent follow up visits to maximize her success with intensive lifestyle modifications for her multiple health conditions.   OBESITY BEHAVIORAL INTERVENTION VISIT  Today's visit was # 7   Starting weight: 214 lb Starting date: 05/06/18 Today's weight : Weight: 204 lb (92.5 kg)  Today's date: 08/06/18 Total lbs lost to date: 10 lb At least 15 minutes were spent on discussing the following behavioral intervention visit.   ASK: We discussed the diagnosis of obesity with Samantha Short today and Samantha Short agreed to give [place] permission to discuss obesity behavioral modification therapy today.  ASSESS: Samantha Short has the diagnosis of obesity and her BMI today is 37.3 Samantha Short is in the action stage of change   ADVISE: Samantha Short was educated on the multiple health risks of obesity as well as the benefit of weight loss to improve her health.  She was advised of the need for long term treatment and the importance of lifestyle modifications to improve her current health and to decrease her risk of future health problems.  AGREE: Multiple dietary modification options and treatment options were discussed and  Samantha Short agreed to follow the recommendations documented in the above note.  ARRANGE: Samantha Short was educated on the importance of frequent visits to treat obesity as outlined per CMS and USPSTF guidelines and agreed to schedule her next follow up appointment today.  I, Jeralene Peters, am acting as transcriptionist for Debbra Riding, MD   I have reviewed the above documentation for accuracy and completeness, and I agree with the above. - Debbra Riding, MD

## 2018-08-12 NOTE — Patient Instructions (Signed)
Medication Instructions:  Your physician recommends that you continue on your current medications as directed. Please refer to the Current Medication list given to you today.  If you need a refill on your cardiac medications before your next appointment, please call your pharmacy.   Lab work: none If you have labs (blood work) drawn today and your tests are completely normal, you will receive your results only by: Marland Kitchen. MyChart Message (if you have MyChart) OR . A paper copy in the mail If you have any lab test that is abnormal or we need to change your treatment, we will call you to review the results.  Testing/Procedures: Your physician has requested that you have an echocardiogram. Echocardiography is a painless test that uses sound waves to create images of your heart. It provides your doctor with information about the size and shape of your heart and how well your heart's chambers and valves are working. This procedure takes approximately one hour. There are no restrictions for this procedure.  To be done in about 6 months.      Follow-Up: At [facility], you and your health needs are our priority.  As part of our continuing mission to provide you with exceptional heart care, we have created designated Provider Care Teams.  These Care Teams include your primary Cardiologist (physician) and Advanced Practice Providers (APPs -  Physician Assistants and Nurse Practitioners) who all work together to provide you with the care you need, when you need it. You will need a follow up appointment in 12 months.  Please call our office 2 months in advance to schedule this appointment.  You may see Verne Carrowhristopher McAlhany, MD or one of the following Advanced Practice Providers on your designated Care Team:   EuporaBrittainy Simmons, PA-C Ronie Spiesayna Dunn, PA-C . Jacolyn ReedyMichele Lenze, PA-C  Any Other Special Instructions Will Be Listed Below (If Applicable).

## 2018-08-12 NOTE — Progress Notes (Signed)
Chief Complaint  Patient presents with  . Follow-up    CAD   History of Present Illness: 72 yo female with history of CAD s/p 1V CABG in 2010, HTN, HLD, GERD and mitral regurgitation here today for cardiac follow up. She was admitted 06/2012 with chest pain and ruled out for MI. Cardiac cath October 2013 with LAD 70-80%, mid RCA 30%, LIMA-LAD patent with competitive flow limiting distal LAD filling, EF 70% with hyperdynamic LV function. Medical therapy was continued. HCTZ was stopped in primary care due to renal insufficiency. I saw her in September 2015 and she c/o dyspnea with ambulation. Lasix was added and at f/u visit here in October 2015 she felt much better. She does not tolerate statins and has not tolerate Norvasc in the past. She stopped Zetia due to cost. She has not wished to consider a PCSK9 inhibitor. Echo October 2017 with LVEF=55-60%, grade 2 diastolic dysfunction. Mild aortic stenosis. Mild mitral regurgitation. She has been seen recently seen in Nephrology for worsened renal function. Lasix held. She has been diagnosed with hyperthyroidism and has undergone ablation. Dr. Everardo All is following this.   She is here today for follow up. She has had several episodes of chest pain over the past few months. This resolves with rest. No associated dyspnea. She was at [facility] last week with her great grandkid and did well there. Still exercising and swimming often with no chest pain. No palpitations, lower extremity edema, orthopnea, PND, dizziness, near syncope or syncope.    Primary Care Physician: Mila Palmer, MD   Past Medical History:  Diagnosis Date  . Asthma   . Chronic diastolic CHF (congestive heart failure) (HCC)    a. 02/2011 Echo: EF 55-60%, Gr2 DD, Mild MR  . Chronic kidney disease   . Coronary artery disease    a. s/p CABG x 1 2010:  LIMA->LAD.;  b. amdx for CP => LHC 07/04/12: LAD 70-80%, mid RCA 30%, LIMA-LAD patent with competitive flow limiting distal LAD filling,  EF 70% with hyperdynamic LV function. Medical therapy continued.  . Dyslipidemia   . GERD (gastroesophageal reflux disease)   . Hyperlipidemia   . Hypertension   . Hyperthyroidism   . Mitral regurgitation    a. mild by echo 02/2011.    Past Surgical History:  Procedure Laterality Date  . ABDOMINAL HYSTERECTOMY  1980  . CARDIAC CATHETERIZATION  07/27/09 & 07/28/09  . CESAREAN SECTION    . CESAREAN SECTION    . CORONARY ARTERY BYPASS GRAFT  08/03/2009   x1 using left internal mammary artery to distal left anterior  descending coronary artery.   Marland Kitchen GALLBLADDER SURGERY  2001  . LEFT HEART CATHETERIZATION WITH CORONARY/GRAFT ANGIOGRAM N/A 07/05/2012   Procedure: LEFT HEART CATHETERIZATION WITH Isabel Caprice;  Surgeon: Wendall Stade, MD;  Location: [facility] CATH LAB;  Service: Cardiovascular;  Laterality: N/A;  . SHOULDER SURGERY  1998 / 2001   from accident  . VESICOVAGINAL FISTULA CLOSURE W/ TAH  1998    Current Outpatient Medications  Medication Sig Dispense Refill  . aspirin 81 MG tablet Take 81 mg by mouth at bedtime.     . Investigational - Study Medication Take 180 mg by mouth See admin instructions. Unnamed study/Medication Name/alternative to statins: Bempedoic Acid 180 mg vs placebo, study drug provided: Take 180 mg by mouth once a day    . lisinopril (PRINIVIL,ZESTRIL) 40 MG tablet TAKE 1 TABLET DAILY 90 tablet 0  . loratadine (CLARITIN) 10 MG tablet  Take 10 mg by mouth daily.    . metoprolol tartrate (LOPRESSOR) 100 MG tablet Take 1 tablet (100 mg total) by mouth 2 (two) times daily. 180 tablet 0  . Multiple Vitamin (MULTIVITAMIN WITH MINERALS) TABS tablet Take 1 tablet by mouth daily.    . Multiple Vitamins-Minerals (OCUVITE ADULT 50+ PO) Take 1 capsule by mouth 2 (two) times daily.     . nitrofurantoin, macrocrystal-monohydrate, (MACROBID) 100 MG capsule Take 1 capsule (100 mg total) by mouth 2 (two) times daily. 10 capsule 0  . Vitamin D, Ergocalciferol, (DRISDOL)  1.25 MG (50000 UT) CAPS capsule Take 1 capsule (50,000 Units total) by mouth every 7 (seven) days. 4 capsule 0   No current facility-administered medications for this visit.     Allergies  Allergen Reactions  . Ceclor [Cefaclor] Other (See Comments)    Lost vision in eye  . Hctz [Hydrochlorothiazide] Other (See Comments)    Renal insufficiency  . Lipitor [Atorvastatin Calcium] Other (See Comments)    Increased A1C  . Norvasc [Amlodipine] Other (See Comments)    Makes patient stiff  . Penicillins Hives, Shortness Of Breath, Itching and Rash    Has patient had a PCN reaction causing immediate rash, facial/tongue/throat swelling, SOB or lightheadedness with hypotension: Yes Has patient had a PCN reaction causing severe rash involving mucus membranes or skin necrosis: Unk Has patient had a PCN reaction that required hospitalization: No Has patient had a PCN reaction occurring within the last 10 years: No If all of the above answers are "NO", then may proceed with Cephalosporin use.   . Sulfa Antibiotics Other (See Comments)    CAUSES SHOCK  . Statins Other (See Comments)    MYALGIAS AND WEAKNESS  . Erythromycin Rash  . Latex Rash  . Levofloxacin Rash  . Tetracyclines & Related Rash    Social History   Socioeconomic History  . Marital status: Widowed    Spouse name: Not on file  . Number of children: 3  . Years of education: Not on file  . Highest education level: Not on file  Occupational History  . Occupation: Retired- Engineer, site  Social Needs  . Financial resource strain: Not on file  . Food insecurity:    Worry: Not on file    Inability: Not on file  . Transportation needs:    Medical: Not on file    Non-medical: Not on file  Tobacco Use  . Smoking status: Former Smoker    Years: 7.00    Types: Cigarettes    Last attempt to quit: 07/12/2010    Years since quitting: 8.0  . Smokeless tobacco: Never Used  Substance and Sexual Activity  . Alcohol use: No  .  Drug use: No  . Sexual activity: Not on file  Lifestyle  . Physical activity:    Days per week: Not on file    Minutes per session: Not on file  . Stress: Not on file  Relationships  . Social connections:    Talks on phone: Not on file    Gets together: Not on file    Attends religious service: Not on file    Active member of club or organization: Not on file    Attends meetings of clubs or organizations: Not on file    Relationship status: Not on file  . Intimate partner violence:    Fear of current or ex partner: Not on file    Emotionally abused: Not on file    Physically  abused: Not on file    Forced sexual activity: Not on file  Other Topics Concern  . Not on file  Social History Narrative  . Not on file    Family History  Problem Relation Age of Onset  . Cancer Father   . Heart attack Father   . High blood pressure Father   . High Cholesterol Father   . Heart disease Father   . Alcoholism Father   . Obesity Father   . Heart attack Mother   . Heart disease Mother        had pacemaker  . High blood pressure Mother   . High Cholesterol Mother   . Obesity Mother   . Cancer Brother   . Heart disease Brother   . Heart disease Sister   . Cancer Sister   . Angina Sister   . Coronary artery disease Son   . Hypertension Sister   . Hypertension Brother   . Thyroid disease Neg Hx     Review of Systems:  As stated in the HPI and otherwise negative.   BP 130/76   Pulse 77   Ht 5\' 2"  (1.575 m)   Wt 208 lb 9.6 oz (94.6 kg)   SpO2 96%   BMI 38.15 kg/m   Physical Examination: General: Well developed, well nourished, NAD  HEENT: OP clear, mucus membranes moist  SKIN: warm, dry. No rashes. Neuro: No focal deficits  Musculoskeletal: Muscle strength 5/5 all ext  Psychiatric: Mood and affect normal  Neck: No JVD, no carotid bruits, no thyromegaly, no lymphadenopathy.  Lungs:Clear bilaterally, no wheezes, rhonci, crackles Cardiovascular: Regular rate and rhythm.  No murmurs, gallops or rubs. Abdomen:Soft. Bowel sounds present. Non-tender.  Extremities: No lower extremity edema. Pulses are 2 + in the bilateral DP/PT.  Echo October 2017: Left ventricle: The cavity size was normal. There was mild focal   basal hypertrophy of the septum. Systolic function was normal.   The estimated ejection fraction was in the range of 55% to 60%.   Wall motion was normal; there were no regional wall motion   abnormalities. Features are consistent with a pseudonormal left   ventricular filling pattern, with concomitant abnormal relaxation   and increased filling pressure (grade 2 diastolic dysfunction). - Aortic valve: There was very mild stenosis. There was trivial   regurgitation. - Mitral valve: Calcified annulus. There was mild regurgitation. - Left atrium: The atrium was mildly dilated. - Pulmonary arteries: Systolic pressure was mildly increased. PA   peak pressure: 41 mm Hg (S).  Impressions:  - Normal LV systolic function; grade 2 diastolic dysfunction;   calcified aortic valve with very mild AS (peak velocity 2.2 ms   and mean gradient 10 mmHg); trace AI; mild MR; mild LAE; mild TR   with mildly elevated pulmonary pressure.  EKG:  EKG is not ordered today. The ekg ordered today demonstrates   Recent Labs: 05/06/2018: ALT 30 05/23/2018: BUN 37; Creatinine, Ser 1.57; Hemoglobin 12.4; Platelets 235; Potassium 4.7; Sodium 140 06/26/2018: TSH 0.34   Lipid Panel    Component Value Date/Time   CHOL 141 05/06/2018 1203   TRIG 69 05/06/2018 1203   HDL 50 05/06/2018 1203   CHOLHDL 3.1 08/07/2017 1003   CHOLHDL 3.3 06/28/2016 0930   VLDL 15 06/28/2016 0930   LDLCALC 77 05/06/2018 1203     Wt Readings from Last 3 Encounters:  08/12/18 208 lb 9.6 oz (94.6 kg)  08/06/18 204 lb (92.5 kg)  07/16/18 205 lb (  93 kg)     Other studies Reviewed: Additional studies/ records that were reviewed today include: . Review of the above records demonstrates:    Assessment and Plan:   1. CAD s/p CABG without angina: She has rare chest pains. She has had 1V CABG with LIMA to LAD. Last cath in 2013 with patent LIMA to LAD, minimal disease RCA, no disease Circumflex. Will continue ASA and beta blocker. She is statin intolerant. She will call if her chest pain worsens or becomes more consistent with exertion.   2. HTN: BP is controlled.   3. Hyperlipidemia: She refuses to take statins due to muscle aches. She stopped Zetia due to cost. We discussed PCSK9 inh but she does not wish to consider at this time.  She also discussed this in the lipid clinic again in November 2018. She is in the CLEAR trial.   4. Mitral regurgitation: Mild by echo 2017.    5. Chronic diastolic CHF: Weight is stable. Lasix prn.   6. Aortic stenosis: Mild echo by echo in 2017. Repeat echo this summer.   Current medicines are reviewed at length with the patient today.  The patient does not have concerns regarding medicines.  The following changes have been made:  no change  Labs/ tests ordered today include:   Orders Placed This Encounter  Procedures  . ECHOCARDIOGRAM COMPLETE   Disposition:   FU with me in 12  months  Signed, Verne Carrowhristopher Mayerli Kirst, MD 08/12/2018 12:07 PM    [facility] [address], [place], [place]  [address] Phone: [phone]; Fax: [phone]

## 2018-08-13 ENCOUNTER — Telehealth: Payer: Self-pay | Admitting: *Deleted

## 2018-08-13 NOTE — Telephone Encounter (Signed)
Clear trial follow up phone call [id number]

## 2018-08-22 ENCOUNTER — Ambulatory Visit (INDEPENDENT_AMBULATORY_CARE_PROVIDER_SITE_OTHER): Payer: Medicare HMO | Admitting: Family Medicine

## 2018-08-22 ENCOUNTER — Encounter (INDEPENDENT_AMBULATORY_CARE_PROVIDER_SITE_OTHER): Payer: Self-pay | Admitting: Family Medicine

## 2018-08-22 VITALS — BP 120/73 | HR 73 | Temp 97.7°F | Ht 62.0 in | Wt 200.0 lb

## 2018-08-22 DIAGNOSIS — I10 Essential (primary) hypertension: Secondary | ICD-10-CM

## 2018-08-22 DIAGNOSIS — Z6836 Body mass index (BMI) 36.0-36.9, adult: Secondary | ICD-10-CM | POA: Diagnosis not present

## 2018-08-22 DIAGNOSIS — E559 Vitamin D deficiency, unspecified: Secondary | ICD-10-CM | POA: Diagnosis not present

## 2018-08-22 MED ORDER — VITAMIN D (ERGOCALCIFEROL) 1.25 MG (50000 UNIT) PO CAPS: 50000.0000 [IU] | ORAL_CAPSULE | ORAL | 0 refills | Status: DC

## 2018-08-26 NOTE — Progress Notes (Signed)
Office: [phone]  /  Fax: [phone]   HPI:   Chief Complaint: OBESITY Samantha Short is here to discuss her progress with her obesity treatment plan. She is on the keep a food journal with 400-500 calories and 35+ grams of protein at supper daily and follow our protein rich vegetarian plan and is following her eating plan approximately 50 % of the time. She states she is exercising 0 minutes 0 times per week. Samantha Short has plans to go to [employer] for 4 days over Christmas Holiday. She saw her kidney doctor and was told to avoid potassium and phosphorus.  Her weight is 200 lb (90.7 kg) today and has had a weight loss of 4 pounds over a period of 2 weeks since her last visit. She has lost 14 lbs since starting treatment with [place].  Vitamin D Deficiency Samantha Short has a diagnosis of vitamin D deficiency. She is currently taking prescription Vit D. She notes improving fatigue and denies nausea, vomiting or muscle weakness.  Hypertension Samantha Short is a 72 y.o. female with hypertension. Samantha Short's blood pressure is controlled. She sees Cardiology and Nephrology. She denies chest pain, chest pressure, or headaches. She is working weight loss to help control her blood pressure with the goal of decreasing her risk of heart attack and stroke.   ALLERGIES: Allergies  Allergen Reactions  . Ceclor [Cefaclor] Other (See Comments)    Lost vision in eye  . Hctz [Hydrochlorothiazide] Other (See Comments)    Renal insufficiency  . Lipitor [Atorvastatin Calcium] Other (See Comments)    Increased A1C  . Norvasc [Amlodipine] Other (See Comments)    Makes patient stiff  . Penicillins Hives, Shortness Of Breath, Itching and Rash    Has patient had a PCN reaction causing immediate rash, facial/tongue/throat swelling, SOB or lightheadedness with hypotension: Yes Has patient had a PCN reaction causing severe rash involving mucus membranes or skin necrosis: Unk Has patient had a PCN  reaction that required hospitalization: No Has patient had a PCN reaction occurring within the last 10 years: No If all of the above answers are "NO", then may proceed with Cephalosporin use.   . Sulfa Antibiotics Other (See Comments)    CAUSES SHOCK  . Statins Other (See Comments)    MYALGIAS AND WEAKNESS  . Erythromycin Rash  . Latex Rash  . Levofloxacin Rash  . Tetracyclines & Related Rash    MEDICATIONS: Current Outpatient Medications on File Prior to Visit  Medication Sig Dispense Refill  . aspirin 81 MG tablet Take 81 mg by mouth at bedtime.     . furosemide (LASIX) 40 MG tablet Take 40 mg by mouth.    . Investigational - Study Medication Take 180 mg by mouth See admin instructions. Unnamed study/Medication Name/alternative to statins: Bempedoic Acid 180 mg vs placebo, study drug provided: Take 180 mg by mouth once a day    . lisinopril (PRINIVIL,ZESTRIL) 40 MG tablet TAKE 1 TABLET DAILY 90 tablet 0  . loratadine (CLARITIN) 10 MG tablet Take 10 mg by mouth daily.    . metoprolol tartrate (LOPRESSOR) 100 MG tablet Take 1 tablet (100 mg total) by mouth 2 (two) times daily. 180 tablet 0  . Multiple Vitamin (MULTIVITAMIN WITH MINERALS) TABS tablet Take 1 tablet by mouth daily.    . Multiple Vitamins-Minerals (OCUVITE ADULT 50+ PO) Take 1 capsule by mouth 2 (two) times daily.     . nitrofurantoin, macrocrystal-monohydrate, (MACROBID) 100 MG capsule Take 1 capsule (100  mg total) by mouth 2 (two) times daily. 10 capsule 0   No current facility-administered medications on file prior to visit.     PAST MEDICAL HISTORY: Past Medical History:  Diagnosis Date  . Asthma   . Chronic diastolic CHF (congestive heart failure) (HCC)    a. 02/2011 Echo: EF 55-60%, Gr2 DD, Mild MR  . Chronic kidney disease   . Coronary artery disease    a. s/p CABG x 1 2010:  LIMA->LAD.;  b. amdx for CP => LHC 07/04/12: LAD 70-80%, mid RCA 30%, LIMA-LAD patent with competitive flow limiting distal LAD filling,  EF 70% with hyperdynamic LV function. Medical therapy continued.  . Dyslipidemia   . GERD (gastroesophageal reflux disease)   . Hyperlipidemia   . Hypertension   . Hyperthyroidism   . Mitral regurgitation    a. mild by echo 02/2011.    PAST SURGICAL HISTORY: Past Surgical History:  Procedure Laterality Date  . ABDOMINAL HYSTERECTOMY  1980  . CARDIAC CATHETERIZATION  07/27/09 & 07/28/09  . CESAREAN SECTION    . CESAREAN SECTION    . CORONARY ARTERY BYPASS GRAFT  08/03/2009   x1 using left internal mammary artery to distal left anterior  descending coronary artery.   Marland Kitchen. GALLBLADDER SURGERY  2001  . LEFT HEART CATHETERIZATION WITH CORONARY/GRAFT ANGIOGRAM N/A 07/05/2012   Procedure: LEFT HEART CATHETERIZATION WITH Isabel CapriceORONARY/GRAFT ANGIOGRAM;  Surgeon: Wendall StadePeter C Nishan, MD;  Location: [facility] CATH LAB;  Service: Cardiovascular;  Laterality: N/A;  . SHOULDER SURGERY  1998 / 2001   from accident  . VESICOVAGINAL FISTULA CLOSURE W/ TAH  1998    SOCIAL HISTORY: Social History   Tobacco Use  . Smoking status: Former Smoker    Years: 7.00    Types: Cigarettes    Last attempt to quit: 07/12/2010    Years since quitting: 8.1  . Smokeless tobacco: Never Used  Substance Use Topics  . Alcohol use: No  . Drug use: No    FAMILY HISTORY: Family History  Problem Relation Age of Onset  . Cancer Father   . Heart attack Father   . High blood pressure Father   . High Cholesterol Father   . Heart disease Father   . Alcoholism Father   . Obesity Father   . Heart attack Mother   . Heart disease Mother        had pacemaker  . High blood pressure Mother   . High Cholesterol Mother   . Obesity Mother   . Cancer Brother   . Heart disease Brother   . Heart disease Sister   . Cancer Sister   . Angina Sister   . Coronary artery disease Son   . Hypertension Sister   . Hypertension Brother   . Thyroid disease Neg Hx     ROS: Review of Systems  Constitutional: Positive for malaise/fatigue and  weight loss.  Cardiovascular: Negative for chest pain.       Negative chest pressure  Neurological: Negative for headaches.    PHYSICAL EXAM: Blood pressure 120/73, pulse 73, temperature 97.7 F (36.5 C), temperature source Oral, height 5\' 2"  (1.575 m), weight 200 lb (90.7 kg), SpO2 100 %. Body mass index is 36.58 kg/m. Physical Exam Vitals signs reviewed.  Constitutional:      Appearance: Normal appearance. She is obese.  Cardiovascular:     Rate and Rhythm: Normal rate.  Pulmonary:     Effort: Pulmonary effort is normal.  Musculoskeletal: Normal range of motion.  Skin:    General: Skin is warm and dry.  Neurological:     Mental Status: She is alert and oriented to person, place, and time.  Psychiatric:        Mood and Affect: Mood normal.        Behavior: Behavior normal.     RECENT LABS AND TESTS: BMET    Component Value Date/Time   NA 140 05/23/2018 1804   NA 142 05/06/2018 1203   K 4.7 05/23/2018 1804   CL 108 05/23/2018 1804   CO2 22 05/23/2018 1804   GLUCOSE 112 (H) 05/23/2018 1804   BUN 37 (H) 05/23/2018 1804   BUN 20 05/06/2018 1203   CREATININE 1.57 (H) 05/23/2018 1804   CREATININE 1.13 (H) 06/28/2016 0930   CALCIUM 9.8 05/23/2018 1804   GFRNONAA 32 (L) 05/23/2018 1804   GFRAA 37 (L) 05/23/2018 1804   Lab Results  Component Value Date   HGBA1C 5.5 05/06/2018   HGBA1C  07/29/2009    5.8 (NOTE) The ADA recommends the following therapeutic goal for glycemic control related to Hgb A1c measurement: Goal of therapy: <6.5 Hgb A1c  Reference: American Diabetes Association: Clinical Practice Recommendations 2010, Diabetes Care, 2010, 33: (Suppl  1).   Lab Results  Component Value Date   INSULIN 19.5 05/06/2018   CBC    Component Value Date/Time   WBC 8.6 05/23/2018 1804   RBC 4.06 05/23/2018 1804   HGB 12.4 05/23/2018 1804   HGB 13.1 05/06/2018 1203   HCT 39.1 05/23/2018 1804   HCT 39.6 05/06/2018 1203   PLT 235 05/23/2018 1804   MCV 96.3  05/23/2018 1804   MCV 93 05/06/2018 1203   MCH 30.5 05/23/2018 1804   MCHC 31.7 05/23/2018 1804   RDW 12.4 05/23/2018 1804   RDW 13.1 05/06/2018 1203   LYMPHSABS 2.1 05/06/2018 1203   MONOABS 0.5 01/13/2015 1519   EOSABS 0.2 05/06/2018 1203   BASOSABS 0.0 05/06/2018 1203   Iron/TIBC/Ferritin/ %Sat No results found for: IRON, TIBC, FERRITIN, IRONPCTSAT Lipid Panel     Component Value Date/Time   CHOL 141 05/06/2018 1203   TRIG 69 05/06/2018 1203   HDL 50 05/06/2018 1203   CHOLHDL 3.1 08/07/2017 1003   CHOLHDL 3.3 06/28/2016 0930   VLDL 15 06/28/2016 0930   LDLCALC 77 05/06/2018 1203   Hepatic Function Panel     Component Value Date/Time   PROT 7.3 05/06/2018 1203   ALBUMIN 4.4 05/06/2018 1203   AST 36 05/06/2018 1203   ALT 30 05/06/2018 1203   ALKPHOS 54 05/06/2018 1203   BILITOT 0.5 05/06/2018 1203   BILIDIR 0.1 06/04/2014 0944      Component Value Date/Time   TSH 0.34 (L) 06/26/2018 1006   TSH 1.13 04/26/2018 1121   TSH 1.11 03/13/2018 1136    ASSESSMENT AND PLAN: Vitamin D deficiency - Plan: Vitamin D, Ergocalciferol, (DRISDOL) 1.25 MG (50000 UT) CAPS capsule  Essential hypertension  Class 2 severe obesity with serious comorbidity and body mass index (BMI) of 36.0 to 36.9 in adult, unspecified obesity type (HCC)  PLAN:  Vitamin D Deficiency Samantha Short was informed that low vitamin D levels contributes to fatigue and are associated with obesity, breast, and colon cancer. Samantha Short agrees to continue taking prescription Vit D @50 ,000 IU every week #4 and we will refill for 1 month. She will follow up for routine testing of vitamin D, at least 2-3 times per year. She was informed of the risk of over-replacement of vitamin D  and agrees to not increase her dose unless she discusses this with [place] first. Samantha Short agrees to follow up with our clinic in 3 weeks.  Hypertension We discussed sodium restriction, working on healthy weight loss, and a regular exercise program as the  means to achieve improved blood pressure control. Samantha Short agreed with this plan and agreed to follow up as directed. We will continue to monitor her blood pressure as well as her progress with the above lifestyle modifications. Samantha Short agrees to continue taking her current medications and will watch for signs of hypotension as she continues her lifestyle modifications. Samantha Short agrees to follow up with our clinic in 3 weeks.  Obesity Samantha Short is currently in the action stage of change. As such, her goal is to continue with weight loss efforts She has agreed to follow the Category 2 plan Page has been instructed to work up to a goal of 150 minutes of combined cardio and strengthening exercise per week for weight loss and overall health benefits. We discussed the following Behavioral Modification Strategies today: work on meal planning and easy cooking plans, holiday eating strategies, travel eating strategies, better snacking choices, and planning for success Lamaya is to use low potassium and low phosphorus, and use products <200 mg/serving.  Samantha Short has agreed to follow up with our clinic in 3 weeks. She was informed of the importance of frequent follow up visits to maximize her success with intensive lifestyle modifications for her multiple health conditions.   OBESITY BEHAVIORAL INTERVENTION VISIT  Today's visit was # 8   Starting weight: 214 lbs Starting date: 05/06/18 Today's weight : 200 lbs Today's date: 08/22/2018 Total lbs lost to date: 14 At least 15 minutes were spent on discussing the following behavioral intervention visit.   ASK: We discussed the diagnosis of obesity with Mertha R Lina today and Krystalyn agreed to give [place] permission to discuss obesity behavioral modification therapy today.  ASSESS: Aiko has the diagnosis of obesity and her BMI today is 36.57 Kailyn is in the action stage of change   ADVISE: Eleanora was educated on the multiple health risks of obesity as well as the  benefit of weight loss to improve her health. She was advised of the need for long term treatment and the importance of lifestyle modifications to improve her current health and to decrease her risk of future health problems.  AGREE: Multiple dietary modification options and treatment options were discussed and  Ghazal agreed to follow the recommendations documented in the above note.  ARRANGE: Mackenize was educated on the importance of frequent visits to treat obesity as outlined per CMS and USPSTF guidelines and agreed to schedule her next follow up appointment today.  I, Burt Knack, am acting as transcriptionist for Debbra Riding, MD  I have reviewed the above documentation for accuracy and completeness, and I agree with the above. - Debbra Riding, MD

## 2018-09-09 ENCOUNTER — Telehealth (INDEPENDENT_AMBULATORY_CARE_PROVIDER_SITE_OTHER): Payer: Self-pay | Admitting: Family Medicine

## 2018-09-09 ENCOUNTER — Other Ambulatory Visit (INDEPENDENT_AMBULATORY_CARE_PROVIDER_SITE_OTHER): Payer: Self-pay

## 2018-09-09 DIAGNOSIS — E559 Vitamin D deficiency, unspecified: Secondary | ICD-10-CM

## 2018-09-09 MED ORDER — VITAMIN D (ERGOCALCIFEROL) 1.25 MG (50000 UNIT) PO CAPS: 50000.0000 [IU] | ORAL_CAPSULE | ORAL | 0 refills | Status: DC

## 2018-09-09 NOTE — Telephone Encounter (Signed)
Pt accidentally threw out Vitamin D RX and requests another RX be called in to [facility] on [address]. Does not have any to take tomorrow. drh

## 2018-09-12 ENCOUNTER — Ambulatory Visit (INDEPENDENT_AMBULATORY_CARE_PROVIDER_SITE_OTHER): Payer: Medicare HMO | Admitting: Family Medicine

## 2018-09-13 DIAGNOSIS — N183 Chronic kidney disease, stage 3 (moderate): Secondary | ICD-10-CM | POA: Diagnosis not present

## 2018-09-13 DIAGNOSIS — I129 Hypertensive chronic kidney disease with stage 1 through stage 4 chronic kidney disease, or unspecified chronic kidney disease: Secondary | ICD-10-CM | POA: Diagnosis not present

## 2018-09-18 NOTE — Telephone Encounter (Signed)
Patient prescription was sent in on 09/09/18. April, CMA

## 2018-09-23 ENCOUNTER — Encounter (INDEPENDENT_AMBULATORY_CARE_PROVIDER_SITE_OTHER): Payer: Self-pay | Admitting: Family Medicine

## 2018-09-23 ENCOUNTER — Ambulatory Visit (INDEPENDENT_AMBULATORY_CARE_PROVIDER_SITE_OTHER): Payer: Medicare HMO | Admitting: Family Medicine

## 2018-09-23 VITALS — BP 126/63 | HR 62 | Temp 97.5°F | Ht 62.0 in | Wt 198.0 lb

## 2018-09-23 DIAGNOSIS — E559 Vitamin D deficiency, unspecified: Secondary | ICD-10-CM

## 2018-09-23 DIAGNOSIS — Z6836 Body mass index (BMI) 36.0-36.9, adult: Secondary | ICD-10-CM

## 2018-09-23 DIAGNOSIS — I10 Essential (primary) hypertension: Secondary | ICD-10-CM | POA: Diagnosis not present

## 2018-09-23 MED ORDER — VITAMIN D (ERGOCALCIFEROL) 1.25 MG (50000 UNIT) PO CAPS: 50000.0000 [IU] | ORAL_CAPSULE | ORAL | 0 refills | Status: DC

## 2018-09-23 NOTE — Progress Notes (Signed)
Office: [phone]  /  Fax: [phone]   HPI:   Chief Complaint: OBESITY Scheryl is here to discuss her progress with her obesity treatment plan. She is on the Category 2 plan and is following her eating plan approximately 40 % of the time. She states she is walking for 30-60 minutes 7 times per week. Kimmy had a hard holiday season. Her grandson in the hospital 3 times, her good friend is struggling with dementia, and is now requesting Devann to care for her. She has continued to follow up with renal. She is still doing salad with ground [place].  Her weight is 198 lb (89.8 kg) today and has had a weight loss of 2 pounds over a period of 4 to 5 weeks since her last visit. She has lost 16 lbs since starting treatment with [place].  Hypertension Rokia R Chlebowski is a 73 y.o. female with hypertension. Marca's blood pressure is controlled. She denies chest pain, chest pressure, or headaches. She is working weight loss to help control her blood pressure with the goal of decreasing her risk of heart attack and stroke.   Vitamin D Deficiency Jolynda has a diagnosis of vitamin D deficiency. She is currently taking prescription Vit D. She notes fatigue and denies nausea, vomiting or muscle weakness.  ASSESSMENT AND PLAN:  Essential hypertension  Vitamin D deficiency - Plan: Vitamin D, Ergocalciferol, (DRISDOL) 1.25 MG (50000 UT) CAPS capsule  Class 2 severe obesity with serious comorbidity and body mass index (BMI) of 36.0 to 36.9 in adult, unspecified obesity type (HCC)  PLAN:  Hypertension We discussed sodium restriction, working on healthy weight loss, and a regular exercise program as the means to achieve improved blood pressure control. Shylynn agreed with this plan and agreed to follow up as directed. We will continue to monitor her blood pressure as well as her progress with the above lifestyle modifications. Fransisca agrees to continue her blood pressure medications and will watch for signs of  hypotension as she continues her lifestyle modifications. Jailyn agrees to follow up with our clinic in 2 weeks.  Vitamin D Deficiency Lesette was informed that low vitamin D levels contributes to fatigue and are associated with obesity, breast, and colon cancer. Jemila agrees to continue taking prescription Vit D @50 ,000 IU every week #4 and we will refill for 1 month. She will follow up for routine testing of vitamin D, at least 2-3 times per year. She was informed of the risk of over-replacement of vitamin D and agrees to not increase her dose unless she discusses this with [place] first. Jonathon agrees to follow up with our clinic in 2 weeks.  Obesity Torri is currently in the action stage of change. As such, her goal is to continue with weight loss efforts She has agreed to follow the Category 2 plan with low phosphorus and low potassium Dreana has been instructed to work up to a goal of 150 minutes of combined cardio and strengthening exercise per week or plan to start going to the gym 1-2 times per week for weight loss and overall health benefits. We discussed the following Behavioral Modification Strategies today: increasing lean protein intake, work on meal planning and easy cooking plans, better snacking choices, and planning for success   Toccara has agreed to follow up with our clinic in 2 weeks. She was informed of the importance of frequent follow up visits to maximize her success with intensive lifestyle modifications for her multiple health conditions.  ALLERGIES: Allergies  Allergen Reactions  . Ceclor [Cefaclor] Other (See Comments)    Lost vision in eye  . Hctz [Hydrochlorothiazide] Other (See Comments)    Renal insufficiency  . Lipitor [Atorvastatin Calcium] Other (See Comments)    Increased A1C  . Norvasc [Amlodipine] Other (See Comments)    Makes patient stiff  . Penicillins Hives, Shortness Of Breath, Itching and Rash    Has patient had a PCN reaction causing immediate rash,  facial/tongue/throat swelling, SOB or lightheadedness with hypotension: Yes Has patient had a PCN reaction causing severe rash involving mucus membranes or skin necrosis: Unk Has patient had a PCN reaction that required hospitalization: No Has patient had a PCN reaction occurring within the last 10 years: No If all of the above answers are "NO", then may proceed with Cephalosporin use.   . Sulfa Antibiotics Other (See Comments)    CAUSES SHOCK  . Statins Other (See Comments)    MYALGIAS AND WEAKNESS  . Erythromycin Rash  . Latex Rash  . Levofloxacin Rash  . Tetracyclines & Related Rash    MEDICATIONS: Current Outpatient Medications on File Prior to Visit  Medication Sig Dispense Refill  . aspirin 81 MG tablet Take 81 mg by mouth at bedtime.     . furosemide (LASIX) 40 MG tablet Take 40 mg by mouth.    . Investigational - Study Medication Take 180 mg by mouth See admin instructions. Unnamed study/Medication Name/alternative to statins: Bempedoic Acid 180 mg vs placebo, study drug provided: Take 180 mg by mouth once a day    . lisinopril (PRINIVIL,ZESTRIL) 40 MG tablet TAKE 1 TABLET DAILY 90 tablet 0  . loratadine (CLARITIN) 10 MG tablet Take 10 mg by mouth daily.    . metoprolol tartrate (LOPRESSOR) 100 MG tablet Take 1 tablet (100 mg total) by mouth 2 (two) times daily. 180 tablet 0  . Multiple Vitamin (MULTIVITAMIN WITH MINERALS) TABS tablet Take 1 tablet by mouth daily.    . Multiple Vitamins-Minerals (OCUVITE ADULT 50+ PO) Take 1 capsule by mouth 2 (two) times daily.     . nitrofurantoin, macrocrystal-monohydrate, (MACROBID) 100 MG capsule Take 1 capsule (100 mg total) by mouth 2 (two) times daily. 10 capsule 0   No current facility-administered medications on file prior to visit.     PAST MEDICAL HISTORY: Past Medical History:  Diagnosis Date  . Asthma   . Chronic diastolic CHF (congestive heart failure) (HCC)    a. 02/2011 Echo: EF 55-60%, Gr2 DD, Mild MR  . Chronic kidney  disease   . Coronary artery disease    a. s/p CABG x 1 2010:  LIMA->LAD.;  b. amdx for CP => LHC 07/04/12: LAD 70-80%, mid RCA 30%, LIMA-LAD patent with competitive flow limiting distal LAD filling, EF 70% with hyperdynamic LV function. Medical therapy continued.  . Dyslipidemia   . GERD (gastroesophageal reflux disease)   . Hyperlipidemia   . Hypertension   . Hyperthyroidism   . Mitral regurgitation    a. mild by echo 02/2011.    PAST SURGICAL HISTORY: Past Surgical History:  Procedure Laterality Date  . ABDOMINAL HYSTERECTOMY  1980  . CARDIAC CATHETERIZATION  07/27/09 & 07/28/09  . CESAREAN SECTION    . CESAREAN SECTION    . CORONARY ARTERY BYPASS GRAFT  08/03/2009   x1 using left internal mammary artery to distal left anterior  descending coronary artery.   Marland Kitchen GALLBLADDER SURGERY  2001  . LEFT HEART CATHETERIZATION WITH CORONARY/GRAFT ANGIOGRAM N/A 07/05/2012   Procedure: LEFT  HEART CATHETERIZATION WITH Isabel CapriceORONARY/GRAFT ANGIOGRAM;  Surgeon: Wendall StadePeter C Nishan, MD;  [facility];  Service: Cardiovascular;  Laterality: N/A;  . SHOULDER SURGERY  1998 / 2001   from accident  . VESICOVAGINAL FISTULA CLOSURE W/ TAH  1998    SOCIAL HISTORY: Social History   Tobacco Use  . Smoking status: Former Smoker    Years: 7.00    Types: Cigarettes    Last attempt to quit: 07/12/2010    Years since quitting: 8.2  . Smokeless tobacco: Never Used  Substance Use Topics  . Alcohol use: No  . Drug use: No    FAMILY HISTORY: Family History  Problem Relation Age of Onset  . Cancer Father   . Heart attack Father   . High blood pressure Father   . High Cholesterol Father   . Heart disease Father   . Alcoholism Father   . Obesity Father   . Heart attack Mother   . Heart disease Mother        had pacemaker  . High blood pressure Mother   . High Cholesterol Mother   . Obesity Mother   . Cancer Brother   . Heart disease Brother   . Heart disease Sister   . Cancer Sister   . Angina  Sister   . Coronary artery disease Son   . Hypertension Sister   . Hypertension Brother   . Thyroid disease Neg Hx     ROS: Review of Systems  Constitutional: Positive for malaise/fatigue and weight loss.  Cardiovascular: Negative for chest pain.       Negative chest pressure  Gastrointestinal: Negative for nausea and vomiting.  Musculoskeletal:       Negative muscle weakness  Neurological: Negative for headaches.    PHYSICAL EXAM: Blood pressure 126/63, pulse 62, temperature (!) 97.5 F (36.4 C), temperature source Oral, height 5\' 2"  (1.575 m), weight 198 lb (89.8 kg), SpO2 97 %. Body mass index is 36.21 kg/m. Physical Exam Vitals signs reviewed.  Constitutional:      Appearance: Normal appearance. She is obese.  Cardiovascular:     Rate and Rhythm: Normal rate.     Pulses: Normal pulses.  Pulmonary:     Effort: Pulmonary effort is normal.     Breath sounds: Normal breath sounds.  Musculoskeletal: Normal range of motion.  Skin:    General: Skin is warm and dry.  Neurological:     Mental Status: She is alert and oriented to person, place, and time.  Psychiatric:        Mood and Affect: Mood normal.        Behavior: Behavior normal.     RECENT LABS AND TESTS: BMET    Component Value Date/Time   NA 140 05/23/2018 1804   NA 142 05/06/2018 1203   K 4.7 05/23/2018 1804   CL 108 05/23/2018 1804   CO2 22 05/23/2018 1804   GLUCOSE 112 (H) 05/23/2018 1804   BUN 37 (H) 05/23/2018 1804   BUN 20 05/06/2018 1203   CREATININE 1.57 (H) 05/23/2018 1804   CREATININE 1.13 (H) 06/28/2016 0930   CALCIUM 9.8 05/23/2018 1804   GFRNONAA 32 (L) 05/23/2018 1804   GFRAA 37 (L) 05/23/2018 1804   Lab Results  Component Value Date   HGBA1C 5.5 05/06/2018   HGBA1C  07/29/2009    5.8 (NOTE) The ADA recommends the following therapeutic goal for glycemic control related to Hgb A1c measurement: Goal of therapy: <6.5 Hgb A1c  Reference: American  Diabetes Association: Clinical Practice  Recommendations 2010, Diabetes Care, 2010, 33: (Suppl  1).   Lab Results  Component Value Date   INSULIN 19.5 05/06/2018   CBC    Component Value Date/Time   WBC 8.6 05/23/2018 1804   RBC 4.06 05/23/2018 1804   HGB 12.4 05/23/2018 1804   HGB 13.1 05/06/2018 1203   HCT 39.1 05/23/2018 1804   HCT 39.6 05/06/2018 1203   PLT 235 05/23/2018 1804   MCV 96.3 05/23/2018 1804   MCV 93 05/06/2018 1203   MCH 30.5 05/23/2018 1804   MCHC 31.7 05/23/2018 1804   RDW 12.4 05/23/2018 1804   RDW 13.1 05/06/2018 1203   LYMPHSABS 2.1 05/06/2018 1203   MONOABS 0.5 01/13/2015 1519   EOSABS 0.2 05/06/2018 1203   BASOSABS 0.0 05/06/2018 1203   Iron/TIBC/Ferritin/ %Sat No results found for: IRON, TIBC, FERRITIN, IRONPCTSAT Lipid Panel     Component Value Date/Time   CHOL 141 05/06/2018 1203   TRIG 69 05/06/2018 1203   HDL 50 05/06/2018 1203   CHOLHDL 3.1 08/07/2017 1003   CHOLHDL 3.3 06/28/2016 0930   VLDL 15 06/28/2016 0930   LDLCALC 77 05/06/2018 1203   Hepatic Function Panel     Component Value Date/Time   PROT 7.3 05/06/2018 1203   ALBUMIN 4.4 05/06/2018 1203   AST 36 05/06/2018 1203   ALT 30 05/06/2018 1203   ALKPHOS 54 05/06/2018 1203   BILITOT 0.5 05/06/2018 1203   BILIDIR 0.1 06/04/2014 0944      Component Value Date/Time   TSH 0.34 (L) 06/26/2018 1006   TSH 1.13 04/26/2018 1121   TSH 1.11 03/13/2018 1136      OBESITY BEHAVIORAL INTERVENTION VISIT  Today's visit was # 9   Starting weight: 214 lbs Starting date: 05/06/18 Today's weight : 198 lbs  Today's date: 09/23/2018 Total lbs lost to date: 16 At least 15 minutes were spent on discussing the following behavioral intervention visit.   ASK: We discussed the diagnosis of obesity with June R Brahm today and Kathlean agreed to give us permission to discuss obesity behavioral modification therapy today.  ASSESS: Miarose has the diagnosis of obesity and her BMI today is 36.21 Mialynn is in the action stage of change    ADVISE: Janith was educated on the multiple health risks of obesity as well as the benefit of weight loss to improve her health. She was advised of the need for long term treatment and the importance of lifestyle modifications to improve her current health and to decrease her risk of future health problems.  AGREE: Multiple dietary modification options and treatment options were discussed and  Truly agreed to follow the recommendations documented in the above note.  ARRANGE: Frieda was educated on the importance of frequent visits to treat obesity as outlined per CMS and USPSTF guidelines and agreed to schedule her next follow up appointment today.  I, Burt KnackSharon Martin, am acting as transcriptionist for Debbra RidingAlexandria Kadolph, MD  I have reviewed the above documentation for accuracy and completeness, and I agree with the above. - Debbra RidingAlexandria Kadolph, MD

## 2018-10-03 DIAGNOSIS — H00016 Hordeolum externum left eye, unspecified eyelid: Secondary | ICD-10-CM | POA: Diagnosis not present

## 2018-10-09 ENCOUNTER — Encounter: Payer: Managed Care, Other (non HMO) | Admitting: *Deleted

## 2018-10-09 VITALS — BP 137/67 | HR 56 | Wt 202.0 lb

## 2018-10-09 DIAGNOSIS — Z006 Encounter for examination for normal comparison and control in clinical research program: Secondary | ICD-10-CM

## 2018-10-09 NOTE — Research (Signed)
Subject to clinic for visit [phone] in the Clear Research study.  No cos, aes or saes to report. 97% compliant with meds and new study drug dispensed.  There was a new med change which was adjusted in the eCRF.  Next phone call and clinic visit scheduled.

## 2018-10-14 ENCOUNTER — Encounter (INDEPENDENT_AMBULATORY_CARE_PROVIDER_SITE_OTHER): Payer: Self-pay

## 2018-10-14 ENCOUNTER — Ambulatory Visit (INDEPENDENT_AMBULATORY_CARE_PROVIDER_SITE_OTHER): Payer: Medicare HMO | Admitting: Family Medicine

## 2018-10-28 ENCOUNTER — Other Ambulatory Visit (INDEPENDENT_AMBULATORY_CARE_PROVIDER_SITE_OTHER): Payer: Self-pay | Admitting: Family Medicine

## 2018-10-28 ENCOUNTER — Ambulatory Visit: Payer: Medicare HMO | Admitting: Endocrinology

## 2018-10-28 DIAGNOSIS — E559 Vitamin D deficiency, unspecified: Secondary | ICD-10-CM

## 2018-10-30 ENCOUNTER — Ambulatory Visit (INDEPENDENT_AMBULATORY_CARE_PROVIDER_SITE_OTHER): Payer: Medicare HMO | Admitting: Family Medicine

## 2018-10-30 ENCOUNTER — Encounter (INDEPENDENT_AMBULATORY_CARE_PROVIDER_SITE_OTHER): Payer: Self-pay

## 2018-11-04 ENCOUNTER — Ambulatory Visit: Payer: Medicare HMO | Admitting: Endocrinology

## 2018-11-08 ENCOUNTER — Ambulatory Visit (INDEPENDENT_AMBULATORY_CARE_PROVIDER_SITE_OTHER): Payer: Medicare HMO | Admitting: Endocrinology

## 2018-11-08 ENCOUNTER — Encounter: Payer: Self-pay | Admitting: Endocrinology

## 2018-11-08 ENCOUNTER — Other Ambulatory Visit: Payer: Self-pay

## 2018-11-08 VITALS — BP 144/78 | HR 77 | Ht 62.0 in | Wt 203.8 lb

## 2018-11-08 DIAGNOSIS — E0590 Thyrotoxicosis, unspecified without thyrotoxic crisis or storm: Secondary | ICD-10-CM | POA: Diagnosis not present

## 2018-11-08 LAB — T4, FREE: Free T4: 1.01 ng/dL (ref 0.60–1.60)

## 2018-11-08 LAB — TSH: TSH: 3.71 u[IU]/mL (ref 0.35–4.50)

## 2018-11-08 NOTE — Progress Notes (Signed)
Subjective:    Patient ID: Samantha Short, female    DOB: [birth year], 73 y.o.   MRN: [id number]  HPI Pt returns for f/u of hyperthyroidism (dx'ed early 2019; CT (2008) showed small thyroid cysts; nuc med scan was c/w multinodular goiter; she had RAI 4/19; she became euthyroid a few mos later).  pt says nausea is due to renal insuff.   Past Medical History:  Diagnosis Date  . Asthma   . Chronic diastolic CHF (congestive heart failure) (HCC)    a. 02/2011 Echo: EF 55-60%, Gr2 DD, Mild MR  . Chronic kidney disease   . Coronary artery disease    a. s/p CABG x 1 2010:  LIMA->LAD.;  b. amdx for CP => LHC 07/04/12: LAD 70-80%, mid RCA 30%, LIMA-LAD patent with competitive flow limiting distal LAD filling, EF 70% with hyperdynamic LV function. Medical therapy continued.  . Dyslipidemia   . GERD (gastroesophageal reflux disease)   . Hyperlipidemia   . Hypertension   . Hyperthyroidism   . Mitral regurgitation    a. mild by echo 02/2011.    Past Surgical History:  Procedure Laterality Date  . ABDOMINAL HYSTERECTOMY  1980  . CARDIAC CATHETERIZATION  07/27/09 & 07/28/09  . CESAREAN SECTION    . CESAREAN SECTION    . CORONARY ARTERY BYPASS GRAFT  08/03/2009   x1 using left internal mammary artery to distal left anterior  descending coronary artery.   Marland Kitchen GALLBLADDER SURGERY  2001  . LEFT HEART CATHETERIZATION WITH CORONARY/GRAFT ANGIOGRAM N/A 07/05/2012   Procedure: LEFT HEART CATHETERIZATION WITH Isabel Caprice;  Surgeon: Wendall Stade, MD;  Location[facility] CATH LAB;  Service: Cardiovascular;  Laterality: N/A;  . SHOULDER SURGERY  1998 / 2001   from accident  . VESICOVAGINAL FISTULA CLOSURE W/ TAH  1998    Social History   Socioeconomic History  . Marital status: Widowed    Spouse name: Not on file  . Number of children: 3  . Years of education: Not on file  . Highest education level: Not on file  Occupational History  . Occupation: Retired- Engineer, site  Social Needs    . Financial resource strain: Not on file  . Food insecurity:    Worry: Not on file    Inability: Not on file  . Transportation needs:    Medical: Not on file    Non-medical: Not on file  Tobacco Use  . Smoking status: Former Smoker    Years: 7.00    Types: Cigarettes    Last attempt to quit: 07/12/2010    Years since quitting: 8.3  . Smokeless tobacco: Never Used  Substance and Sexual Activity  . Alcohol use: No  . Drug use: No  . Sexual activity: Not on file  Lifestyle  . Physical activity:    Days per week: Not on file    Minutes per session: Not on file  . Stress: Not on file  Relationships  . Social connections:    Talks on phone: Not on file    Gets together: Not on file    Attends religious service: Not on file    Active member of club or organization: Not on file    Attends meetings of clubs or organizations: Not on file    Relationship status: Not on file  . Intimate partner violence:    Fear of current or ex partner: Not on file    Emotionally abused: Not on file    Physically abused: Not  on file    Forced sexual activity: Not on file  Other Topics Concern  . Not on file  Social History Narrative  . Not on file    Current Outpatient Medications on File Prior to Visit  Medication Sig Dispense Refill  . aspirin 81 MG tablet Take 81 mg by mouth at bedtime.     . furosemide (LASIX) 40 MG tablet Take 40 mg by mouth.    . Investigational - Study Medication Take 180 mg by mouth See admin instructions. Unnamed study/Medication Name/alternative to statins: Bempedoic Acid 180 mg vs placebo, study drug provided: Take 180 mg by mouth once a day    . lisinopril (PRINIVIL,ZESTRIL) 40 MG tablet TAKE 1 TABLET DAILY 90 tablet 0  . loratadine (CLARITIN) 10 MG tablet Take 10 mg by mouth daily.    . metoprolol tartrate (LOPRESSOR) 100 MG tablet Take 1 tablet (100 mg total) by mouth 2 (two) times daily. 180 tablet 0  . Multiple Vitamin (MULTIVITAMIN WITH MINERALS) TABS tablet  Take 1 tablet by mouth daily.    . Multiple Vitamins-Minerals (OCUVITE ADULT 50+ PO) Take 1 capsule by mouth 2 (two) times daily.     . Vitamin D, Ergocalciferol, (DRISDOL) 1.25 MG (50000 UT) CAPS capsule Take 1 capsule (50,000 Units total) by mouth every 7 (seven) days. 4 capsule 0   No current facility-administered medications on file prior to visit.     Allergies  Allergen Reactions  . Ceclor [Cefaclor] Other (See Comments)    Lost vision in eye  . Hctz [Hydrochlorothiazide] Other (See Comments)    Renal insufficiency  . Lipitor [Atorvastatin Calcium] Other (See Comments)    Increased A1C  . Norvasc [Amlodipine] Other (See Comments)    Makes patient stiff  . Penicillins Hives, Shortness Of Breath, Itching and Rash    Has patient had a PCN reaction causing immediate rash, facial/tongue/throat swelling, SOB or lightheadedness with hypotension: Yes Has patient had a PCN reaction causing severe rash involving mucus membranes or skin necrosis: Unk Has patient had a PCN reaction that required hospitalization: No Has patient had a PCN reaction occurring within the last 10 years: No If all of the above answers are "NO", then may proceed with Cephalosporin use.   . Sulfa Antibiotics Other (See Comments)    CAUSES SHOCK  . Statins Other (See Comments)    MYALGIAS AND WEAKNESS  . Erythromycin Rash  . Latex Rash  . Levofloxacin Rash  . Tetracyclines & Related Rash    Family History  Problem Relation Age of Onset  . Cancer Father   . Heart attack Father   . High blood pressure Father   . High Cholesterol Father   . Heart disease Father   . Alcoholism Father   . Obesity Father   . Heart attack Mother   . Heart disease Mother        had pacemaker  . High blood pressure Mother   . High Cholesterol Mother   . Obesity Mother   . Cancer Brother   . Heart disease Brother   . Heart disease Sister   . Cancer Sister   . Angina Sister   . Coronary artery disease Son   . Hypertension  Sister   . Hypertension Brother   . Thyroid disease Neg Hx     BP (!) 144/78 (BP Location: Right Arm, Patient Position: Sitting, Cuff Size: Large)   Pulse 77   Ht 5\' 2"  (1.575 m)   Wt 203 lb  12.8 oz (92.4 kg)   SpO2 97%   BMI 37.28 kg/m    Review of Systems Denies neck swelling.     Objective:   Physical Exam VITAL SIGNS:  See vs page GENERAL: no distress NECK: There is no palpable thyroid enlargement.  No thyroid nodule is palpable.  No palpable lymphadenopathy at the anterior neck.   Lab Results  Component Value Date   TSH 3.71 11/08/2018      Assessment & Plan:  Hyperthyroidism: resolved with RAI MNG: clinically stable  Patient Instructions  Thyroid blood tests are requested for you today.  We'll let you know about the results.   If it is overactive again, you should consider redoing the radioactive iodine pill.   If it is normal, please come back for a follow-up appointment in 4-6 months.

## 2018-11-08 NOTE — Patient Instructions (Signed)
Thyroid blood tests are requested for you today.  We'll let you know about the results.   If it is overactive again, you should consider redoing the radioactive iodine pill.   If it is normal, please come back for a follow-up appointment in 4-6 months.

## 2018-11-12 DIAGNOSIS — I129 Hypertensive chronic kidney disease with stage 1 through stage 4 chronic kidney disease, or unspecified chronic kidney disease: Secondary | ICD-10-CM | POA: Diagnosis not present

## 2018-11-12 DIAGNOSIS — N2589 Other disorders resulting from impaired renal tubular function: Secondary | ICD-10-CM | POA: Diagnosis not present

## 2018-11-12 DIAGNOSIS — N2581 Secondary hyperparathyroidism of renal origin: Secondary | ICD-10-CM | POA: Diagnosis not present

## 2018-11-12 DIAGNOSIS — E785 Hyperlipidemia, unspecified: Secondary | ICD-10-CM | POA: Diagnosis not present

## 2018-11-12 DIAGNOSIS — J45909 Unspecified asthma, uncomplicated: Secondary | ICD-10-CM | POA: Diagnosis not present

## 2018-11-12 DIAGNOSIS — E559 Vitamin D deficiency, unspecified: Secondary | ICD-10-CM | POA: Diagnosis not present

## 2018-11-12 DIAGNOSIS — D631 Anemia in chronic kidney disease: Secondary | ICD-10-CM | POA: Diagnosis not present

## 2018-11-12 DIAGNOSIS — N183 Chronic kidney disease, stage 3 (moderate): Secondary | ICD-10-CM | POA: Diagnosis not present

## 2018-11-12 DIAGNOSIS — I2510 Atherosclerotic heart disease of native coronary artery without angina pectoris: Secondary | ICD-10-CM | POA: Diagnosis not present

## 2018-12-09 ENCOUNTER — Encounter (INDEPENDENT_AMBULATORY_CARE_PROVIDER_SITE_OTHER): Payer: Self-pay

## 2018-12-25 ENCOUNTER — Encounter: Payer: Self-pay | Admitting: *Deleted

## 2018-12-25 DIAGNOSIS — Z006 Encounter for examination for normal comparison and control in clinical research program: Secondary | ICD-10-CM

## 2018-12-25 NOTE — Research (Addendum)
Samantha Short called the research office today because she was unable to get in touch with the Cardiology office. She is needing a refill for her lisinopril and metoprolol. I will forward to Dr. Clifton James.

## 2018-12-26 ENCOUNTER — Other Ambulatory Visit: Payer: Self-pay | Admitting: Cardiovascular Disease

## 2018-12-26 ENCOUNTER — Other Ambulatory Visit: Payer: Self-pay | Admitting: *Deleted

## 2018-12-26 DIAGNOSIS — I10 Essential (primary) hypertension: Secondary | ICD-10-CM

## 2018-12-26 MED ORDER — LISINOPRIL 40 MG PO TABS: 40.0000 mg | ORAL_TABLET | Freq: Every day | ORAL | 2 refills | Status: DC

## 2018-12-26 MED ORDER — METOPROLOL TARTRATE 100 MG PO TABS: 100.0000 mg | ORAL_TABLET | Freq: Two times a day (BID) | ORAL | 2 refills | Status: DC

## 2018-12-30 ENCOUNTER — Telehealth: Payer: Self-pay | Admitting: *Deleted

## 2018-12-30 NOTE — Telephone Encounter (Signed)
Phone call to patient to verify that she received her prescriptions.  Verbalized that she has indeed received them.  She also was interested in Covid 19 research if any studies were enrolling.  Expressed an interest to enroll.

## 2019-01-13 ENCOUNTER — Ambulatory Visit (HOSPITAL_COMMUNITY): Payer: Medicare HMO | Attending: Cardiovascular Disease

## 2019-01-13 ENCOUNTER — Other Ambulatory Visit: Payer: Self-pay

## 2019-01-13 DIAGNOSIS — I2510 Atherosclerotic heart disease of native coronary artery without angina pectoris: Secondary | ICD-10-CM | POA: Insufficient documentation

## 2019-01-13 DIAGNOSIS — I5032 Chronic diastolic (congestive) heart failure: Secondary | ICD-10-CM

## 2019-01-13 DIAGNOSIS — I350 Nonrheumatic aortic (valve) stenosis: Secondary | ICD-10-CM | POA: Diagnosis not present

## 2019-01-21 ENCOUNTER — Telehealth: Payer: Self-pay | Admitting: *Deleted

## 2019-01-21 NOTE — Telephone Encounter (Signed)
Spoke with subject re: clear research trial [phone].  No cos, aes or saes to report.  Will call to reschedule next visit once Covid 19 restrictions are lifted.

## 2019-01-23 DIAGNOSIS — I129 Hypertensive chronic kidney disease with stage 1 through stage 4 chronic kidney disease, or unspecified chronic kidney disease: Secondary | ICD-10-CM | POA: Diagnosis not present

## 2019-02-04 ENCOUNTER — Other Ambulatory Visit: Payer: Self-pay

## 2019-02-04 ENCOUNTER — Encounter (INDEPENDENT_AMBULATORY_CARE_PROVIDER_SITE_OTHER): Payer: Medicare HMO | Admitting: Ophthalmology

## 2019-02-04 DIAGNOSIS — H35033 Hypertensive retinopathy, bilateral: Secondary | ICD-10-CM

## 2019-02-04 DIAGNOSIS — H353132 Nonexudative age-related macular degeneration, bilateral, intermediate dry stage: Secondary | ICD-10-CM

## 2019-02-04 DIAGNOSIS — I10 Essential (primary) hypertension: Secondary | ICD-10-CM

## 2019-02-04 DIAGNOSIS — H43813 Vitreous degeneration, bilateral: Secondary | ICD-10-CM | POA: Diagnosis not present

## 2019-02-25 ENCOUNTER — Other Ambulatory Visit: Payer: Self-pay | Admitting: Cardiovascular Disease

## 2019-04-01 DIAGNOSIS — I129 Hypertensive chronic kidney disease with stage 1 through stage 4 chronic kidney disease, or unspecified chronic kidney disease: Secondary | ICD-10-CM | POA: Diagnosis not present

## 2019-04-01 DIAGNOSIS — N189 Chronic kidney disease, unspecified: Secondary | ICD-10-CM | POA: Diagnosis not present

## 2019-04-01 DIAGNOSIS — N2589 Other disorders resulting from impaired renal tubular function: Secondary | ICD-10-CM | POA: Diagnosis not present

## 2019-04-01 DIAGNOSIS — N2581 Secondary hyperparathyroidism of renal origin: Secondary | ICD-10-CM | POA: Diagnosis not present

## 2019-04-01 DIAGNOSIS — D631 Anemia in chronic kidney disease: Secondary | ICD-10-CM | POA: Diagnosis not present

## 2019-04-01 DIAGNOSIS — N183 Chronic kidney disease, stage 3 (moderate): Secondary | ICD-10-CM | POA: Diagnosis not present

## 2019-04-07 ENCOUNTER — Encounter: Payer: Medicare HMO | Admitting: *Deleted

## 2019-04-08 ENCOUNTER — Ambulatory Visit: Payer: Medicare HMO | Admitting: Endocrinology

## 2019-04-08 ENCOUNTER — Encounter: Payer: Self-pay | Admitting: Endocrinology

## 2019-04-08 ENCOUNTER — Other Ambulatory Visit: Payer: Self-pay

## 2019-04-08 VITALS — BP 122/72 | HR 62 | Ht 62.0 in | Wt 212.4 lb

## 2019-04-08 DIAGNOSIS — E0590 Thyrotoxicosis, unspecified without thyrotoxic crisis or storm: Secondary | ICD-10-CM

## 2019-04-08 LAB — TSH: TSH: 3.92 u[IU]/mL (ref 0.35–4.50)

## 2019-04-08 LAB — T4, FREE: Free T4: 1.09 ng/dL (ref 0.60–1.60)

## 2019-04-08 NOTE — Progress Notes (Signed)
Subjective:    Patient ID: Samantha Short, female    DOB: 13-May-1946, 73 y.o.   MRN: [id number]  HPI Pt returns for f/u of hyperthyroidism (dx'ed early 2019; CT (2008) showed small thyroid cysts; nuc med scan was c/w multinodular goiter; she had RAI 4/19; she became euthyroid a few mos later; she has stayed euthyroid off rx since then).  pt states she feels well in general.   Past Medical History:  Diagnosis Date  . Asthma   . Chronic diastolic CHF (congestive heart failure) (HCC)    a. 02/2011 Echo: EF 55-60%, Gr2 DD, Mild MR  . Chronic kidney disease   . Coronary artery disease    a. s/p CABG x 1 2010:  LIMA->LAD.;  b. amdx for CP => LHC 07/04/12: LAD 70-80%, mid RCA 30%, LIMA-LAD patent with competitive flow limiting distal LAD filling, EF 70% with hyperdynamic LV function. Medical therapy continued.  . Dyslipidemia   . GERD (gastroesophageal reflux disease)   . Hyperlipidemia   . Hypertension   . Hyperthyroidism   . Mitral regurgitation    a. mild by echo 02/2011.    Past Surgical History:  Procedure Laterality Date  . ABDOMINAL HYSTERECTOMY  1980  . CARDIAC CATHETERIZATION  07/27/09 & 07/28/09  . CESAREAN SECTION    . CESAREAN SECTION    . CORONARY ARTERY BYPASS GRAFT  08/03/2009   x1 using left internal mammary artery to distal left anterior  descending coronary artery.   Marland Kitchen. GALLBLADDER SURGERY  2001  . LEFT HEART CATHETERIZATION WITH CORONARY/GRAFT ANGIOGRAM N/A 07/05/2012   Procedure: LEFT HEART CATHETERIZATION WITH Isabel CapriceORONARY/GRAFT ANGIOGRAM;  Surgeon: Wendall StadePeter C Nishan, MD;  [facility];  Service: Cardiovascular;  Laterality: N/A;  . SHOULDER SURGERY  1998 / 2001   from accident  . VESICOVAGINAL FISTULA CLOSURE W/ TAH  1998    Social History   Socioeconomic History  . Marital status: Widowed    Spouse name: Not on file  . Number of children: 3  . Years of education: Not on file  . Highest education level: Not on file  Occupational History  . Occupation:  Retired- Engineer, siteschool teacher  Social Needs  . Financial resource strain: Not on file  . Food insecurity    Worry: Not on file    Inability: Not on file  . Transportation needs    Medical: Not on file    Non-medical: Not on file  Tobacco Use  . Smoking status: Former Smoker    Years: 7.00    Types: Cigarettes    Quit date: 07/12/2010    Years since quitting: 8.7  . Smokeless tobacco: Never Used  Substance and Sexual Activity  . Alcohol use: No  . Drug use: No  . Sexual activity: Not on file  Lifestyle  . Physical activity    Days per week: Not on file    Minutes per session: Not on file  . Stress: Not on file  Relationships  . Social Musicianconnections    Talks on phone: Not on file    Gets together: Not on file    Attends religious service: Not on file    Active member of club or organization: Not on file    Attends meetings of clubs or organizations: Not on file    Relationship status: Not on file  . Intimate partner violence    Fear of current or ex partner: Not on file    Emotionally abused: Not on file  Physically abused: Not on file    Forced sexual activity: Not on file  Other Topics Concern  . Not on file  Social History Narrative  . Not on file    Current Outpatient Medications on File Prior to Visit  Medication Sig Dispense Refill  . aspirin 81 MG tablet Take 81 mg by mouth at bedtime.     . furosemide (LASIX) 40 MG tablet Take 40 mg by mouth.    . Investigational - Study Medication Take 180 mg by mouth See admin instructions. Unnamed study/Medication Name/alternative to statins: Bempedoic Acid 180 mg vs placebo, study drug provided: Take 180 mg by mouth once a day    . lisinopril (PRINIVIL,ZESTRIL) 40 MG tablet Take 1 tablet (40 mg total) by mouth daily. 90 tablet 2  . loratadine (CLARITIN) 10 MG tablet Take 10 mg by mouth daily.    . metoprolol tartrate (LOPRESSOR) 100 MG tablet Take 1 tablet (100 mg total) by mouth 2 (two) times daily. 180 tablet 2  . Multiple  Vitamin (MULTIVITAMIN WITH MINERALS) TABS tablet Take 1 tablet by mouth daily.    . Multiple Vitamins-Minerals (OCUVITE ADULT 50+ PO) Take 1 capsule by mouth 2 (two) times daily.     . Vitamin D, Ergocalciferol, (DRISDOL) 1.25 MG (50000 UT) CAPS capsule Take 1 capsule (50,000 Units total) by mouth every 7 (seven) days. 4 capsule 0   No current facility-administered medications on file prior to visit.     Allergies  Allergen Reactions  . Ceclor [Cefaclor] Other (See Comments)    Lost vision in eye  . Hctz [Hydrochlorothiazide] Other (See Comments)    Renal insufficiency  . Lipitor [Atorvastatin Calcium] Other (See Comments)    Increased A1C  . Norvasc [Amlodipine] Other (See Comments)    Makes patient stiff  . Penicillins Hives, Shortness Of Breath, Itching and Rash    Has patient had a PCN reaction causing immediate rash, facial/tongue/throat swelling, SOB or lightheadedness with hypotension: Yes Has patient had a PCN reaction causing severe rash involving mucus membranes or skin necrosis: Unk Has patient had a PCN reaction that required hospitalization: No Has patient had a PCN reaction occurring within the last 10 years: No If all of the above answers are "NO", then may proceed with Cephalosporin use.   . Sulfa Antibiotics Other (See Comments)    CAUSES SHOCK  . Statins Other (See Comments)    MYALGIAS AND WEAKNESS  . Erythromycin Rash  . Latex Rash  . Levofloxacin Rash  . Tetracyclines & Related Rash    Family History  Problem Relation Age of Onset  . Cancer Father   . Heart attack Father   . High blood pressure Father   . High Cholesterol Father   . Heart disease Father   . Alcoholism Father   . Obesity Father   . Heart attack Mother   . Heart disease Mother        had pacemaker  . High blood pressure Mother   . High Cholesterol Mother   . Obesity Mother   . Cancer Brother   . Heart disease Brother   . Heart disease Sister   . Cancer Sister   . Angina Sister    . Coronary artery disease Son   . Hypertension Sister   . Hypertension Brother   . Thyroid disease Neg Hx     BP 122/72 (BP Location: Left Arm, Patient Position: Sitting, Cuff Size: Large)   Pulse 62   Ht 5\' 2"  (  1.575 m)   Wt 212 lb 6.4 oz (96.3 kg)   SpO2 97%   BMI 38.85 kg/m    Review of Systems No weight change.      Objective:   Physical Exam VITAL SIGNS:  See vs page.  GENERAL: no distress.   NECK: There is no palpable thyroid enlargement.  No thyroid nodule is palpable.  No palpable lymphadenopathy at the anterior neck.    Lab Results  Component Value Date   TSH 3.92 04/08/2019      Assessment & Plan:  Hyperthyroidism: resolved with RAI: we'll follow Small [facility]NG: all she needs is periodic clinical exam  Patient Instructions  Thyroid blood tests are requested for you today.  We'll let you know about the results.   Your thyroid is unlikely to stay normal for a long time.  It usually goes high or low from here.  If it is normal, please come back for a follow-up appointment in 6 months.

## 2019-04-08 NOTE — Patient Instructions (Addendum)
Thyroid blood tests are requested for you today.  We'll let you know about the results.   Your thyroid is unlikely to stay normal for a long time.  It usually goes high or low from here.  If it is normal, please come back for a follow-up appointment in 6 months.

## 2019-04-09 ENCOUNTER — Encounter: Payer: Medicare HMO | Admitting: *Deleted

## 2019-04-09 ENCOUNTER — Other Ambulatory Visit: Payer: Self-pay

## 2019-04-09 VITALS — BP 123/65 | HR 62 | Temp 97.3°F | Resp 18 | Wt 211.8 lb

## 2019-04-09 DIAGNOSIS — Z006 Encounter for examination for normal comparison and control in clinical research program: Secondary | ICD-10-CM

## 2019-04-09 NOTE — Research (Addendum)
Subject to clinic for visit T8 M18 in the Clear Research study.  No cos, aes or saes to report. 85% compliant with meds and new study drug dispensed.  There was no new med change.  Next phone call and clinic visit scheduled.  Re-consented to [place] version 7.0

## 2019-06-12 DIAGNOSIS — R69 Illness, unspecified: Secondary | ICD-10-CM | POA: Diagnosis not present

## 2019-08-01 ENCOUNTER — Telehealth: Payer: Self-pay | Admitting: *Deleted

## 2019-08-01 DIAGNOSIS — Z006 Encounter for examination for normal comparison and control in clinical research program: Secondary | ICD-10-CM

## 2019-08-01 NOTE — Telephone Encounter (Signed)
Phone call to patient for visit T9-M21 in the clear research study.  No aes or saes to report.  Next clinic visit verified.

## 2019-08-12 ENCOUNTER — Other Ambulatory Visit: Payer: Self-pay | Admitting: Family Medicine

## 2019-08-12 DIAGNOSIS — Z1231 Encounter for screening mammogram for malignant neoplasm of breast: Secondary | ICD-10-CM

## 2019-08-14 ENCOUNTER — Ambulatory Visit
Admission: RE | Admit: 2019-08-14 | Discharge: 2019-08-14 | Disposition: A | Discharge status: Home / Self Care | Payer: Medicare HMO | Source: Ambulatory Visit | Attending: Family Medicine | Admitting: Family Medicine

## 2019-08-14 ENCOUNTER — Other Ambulatory Visit: Payer: Self-pay

## 2019-08-14 DIAGNOSIS — Z1231 Encounter for screening mammogram for malignant neoplasm of breast: Secondary | ICD-10-CM | POA: Diagnosis not present

## 2019-09-10 NOTE — Progress Notes (Deleted)
No chief complaint on file.  History of Present Illness: 73 yo female with history of CAD s/p 1V CABG in 2010, HTN, HLD, GERD, aortic stenosis and mitral regurgitation here today for cardiac follow up. She was admitted 06/2012 with chest pain and ruled out for MI. Cardiac cath October 2013 with LAD 70-80%, mid RCA 30%, LIMA-LAD patent with competitive flow limiting distal LAD filling, EF 70% with hyperdynamic LV function. Medical therapy was continued. HCTZ was stopped in primary care due to renal insufficiency. I saw her in September 2015 and she c/o dyspnea with ambulation. Lasix was added and at f/u visit here in October 2015 she felt much better. She does not tolerate statins and has not tolerate Norvasc in the past. She stopped Zetia due to cost. She has not wished to consider a PCSK9 inhibitor. Echo October 2017 with LVEF=55-60%, grade 2 diastolic dysfunction. Mild aortic stenosis. Mild mitral regurgitation. She has been seen recently seen in Nephrology for worsened renal function. Lasix held. She has been diagnosed with hyperthyroidism and has undergone ablation. Dr. Everardo All is following this. Most recent echo May 2020 with LVEF=60-65%, mild MR, mild AS.   She is here today for follow up. The patient denies any chest pain, dyspnea, palpitations, lower extremity edema, orthopnea, PND, dizziness, near syncope or syncope.    Primary Care Physician: Mila Palmer, MD  Past Medical History:  Diagnosis Date  . Asthma   . Chronic diastolic CHF (congestive heart failure) (HCC)    a. 02/2011 Echo: EF 55-60%, Gr2 DD, Mild MR  . Chronic kidney disease   . Coronary artery disease    a. s/p CABG x 1 2010:  LIMA->LAD.;  b. amdx for CP => LHC 07/04/12: LAD 70-80%, mid RCA 30%, LIMA-LAD patent with competitive flow limiting distal LAD filling, EF 70% with hyperdynamic LV function. Medical therapy continued.  . Dyslipidemia   . GERD (gastroesophageal reflux disease)   . Hyperlipidemia   .  Hypertension   . Hyperthyroidism   . Mitral regurgitation    a. mild by echo 02/2011.    Past Surgical History:  Procedure Laterality Date  . ABDOMINAL HYSTERECTOMY  1980  . CARDIAC CATHETERIZATION  07/27/09 & 07/28/09  . CESAREAN SECTION    . CESAREAN SECTION    . CORONARY ARTERY BYPASS GRAFT  08/03/2009   x1 using left internal mammary artery to distal left anterior  descending coronary artery.   Marland Kitchen GALLBLADDER SURGERY  2001  . LEFT HEART CATHETERIZATION WITH CORONARY/GRAFT ANGIOGRAM N/A 07/05/2012   Procedure: LEFT HEART CATHETERIZATION WITH Isabel Caprice;  Surgeon: Wendall Stade, MD;  Location: [facility] CATH LAB;  Service: Cardiovascular;  Laterality: N/A;  . SHOULDER SURGERY  1998 / 2001   from accident  . VESICOVAGINAL FISTULA CLOSURE W/ TAH  1998    Current Outpatient Medications  Medication Sig Dispense Refill  . aspirin 81 MG tablet Take 81 mg by mouth at bedtime.     . furosemide (LASIX) 40 MG tablet Take 40 mg by mouth.    . Investigational - Study Medication Take 180 mg by mouth See admin instructions. Unnamed study/Medication Name/alternative to statins: Bempedoic Acid 180 mg vs placebo, study drug provided: Take 180 mg by mouth once a day    . lisinopril (PRINIVIL,ZESTRIL) 40 MG tablet Take 1 tablet (40 mg total) by mouth daily. 90 tablet 2  . loratadine (CLARITIN) 10 MG tablet Take 10 mg by mouth daily.    . metoprolol tartrate (LOPRESSOR) 100 MG  tablet Take 1 tablet (100 mg total) by mouth 2 (two) times daily. 180 tablet 2  . Multiple Vitamin (MULTIVITAMIN WITH MINERALS) TABS tablet Take 1 tablet by mouth daily.    . Multiple Vitamins-Minerals (OCUVITE ADULT 50+ PO) Take 1 capsule by mouth 2 (two) times daily.     . Vitamin D, Ergocalciferol, (DRISDOL) 1.25 MG (50000 UT) CAPS capsule Take 1 capsule (50,000 Units total) by mouth every 7 (seven) days. 4 capsule 0   No current facility-administered medications for this visit.    Allergies  Allergen Reactions  .  Ceclor [Cefaclor] Other (See Comments)    Lost vision in eye  . Hctz [Hydrochlorothiazide] Other (See Comments)    Renal insufficiency  . Lipitor [Atorvastatin Calcium] Other (See Comments)    Increased A1C  . Norvasc [Amlodipine] Other (See Comments)    Makes patient stiff  . Penicillins Hives, Shortness Of Breath, Itching and Rash    Has patient had a PCN reaction causing immediate rash, facial/tongue/throat swelling, SOB or lightheadedness with hypotension: Yes Has patient had a PCN reaction causing severe rash involving mucus membranes or skin necrosis: Unk Has patient had a PCN reaction that required hospitalization: No Has patient had a PCN reaction occurring within the last 10 years: No If all of the above answers are "NO", then may proceed with Cephalosporin use.   . Sulfa Antibiotics Other (See Comments)    CAUSES SHOCK  . Statins Other (See Comments)    MYALGIAS AND WEAKNESS  . Erythromycin Rash  . Latex Rash  . Levofloxacin Rash  . Tetracyclines & Related Rash    Social History   Socioeconomic History  . Marital status: Widowed    Spouse name: Not on file  . Number of children: 3  . Years of education: Not on file  . Highest education level: Not on file  Occupational History  . Occupation: Retired- Engineer, siteschool teacher  Tobacco Use  . Smoking status: Former Smoker    Years: 7.00    Types: Cigarettes    Quit date: 07/12/2010    Years since quitting: 9.1  . Smokeless tobacco: Never Used  Substance and Sexual Activity  . Alcohol use: No  . Drug use: No  . Sexual activity: Not on file  Other Topics Concern  . Not on file  Social History Narrative  . Not on file   Social Determinants of Health   Financial Resource Strain:   . Difficulty of Paying Living Expenses: Not on file  Food Insecurity:   . Worried About Programme researcher, broadcasting/film/videounning Out of Food in the Last Year: Not on file  . Ran Out of Food in the Last Year: Not on file  Transportation Needs:   . Lack of Transportation  (Medical): Not on file  . Lack of Transportation (Non-Medical): Not on file  Physical Activity:   . Days of Exercise per Week: Not on file  . Minutes of Exercise per Session: Not on file  Stress:   . Feeling of Stress : Not on file  Social Connections:   . Frequency of Communication with Friends and Family: Not on file  . Frequency of Social Gatherings with Friends and Family: Not on file  . Attends Religious Services: Not on file  . Active Member of Clubs or Organizations: Not on file  . Attends BankerClub or Organization Meetings: Not on file  . Marital Status: Not on file  Intimate Partner Violence:   . Fear of Current or Ex-Partner: Not on file  .  Emotionally Abused: Not on file  . Physically Abused: Not on file  . Sexually Abused: Not on file    Family History  Problem Relation Age of Onset  . Cancer Father   . Heart attack Father   . High blood pressure Father   . High Cholesterol Father   . Heart disease Father   . Alcoholism Father   . Obesity Father   . Heart attack Mother   . Heart disease Mother        had pacemaker  . High blood pressure Mother   . High Cholesterol Mother   . Obesity Mother   . Cancer Brother   . Heart disease Brother   . Heart disease Sister   . Cancer Sister   . Angina Sister   . Coronary artery disease Son   . Hypertension Sister   . Hypertension Brother   . Thyroid disease Neg Hx     Review of Systems:  As stated in the HPI and otherwise negative.   There were no vitals taken for this visit.  Physical Examination:  General: Well developed, well nourished, NAD  HEENT: OP clear, mucus membranes moist  SKIN: warm, dry. No rashes. Neuro: No focal deficits  Musculoskeletal: Muscle strength 5/5 all ext  Psychiatric: Mood and affect normal  Neck: No JVD, no carotid bruits, no thyromegaly, no lymphadenopathy.  Lungs:Clear bilaterally, no wheezes, rhonci, crackles Cardiovascular: Regular rate and rhythm. No murmurs, gallops or  rubs. Abdomen:Soft. Bowel sounds present. Non-tender.  Extremities: No lower extremity edema. Pulses are 2 + in the bilateral DP/PT.  Echo May 2020:  1. The left ventricle has normal systolic function with an ejection fraction of 60-65%. The cavity size was normal. Left ventricular diastolic Doppler parameters are consistent with pseudonormalization. Elevated mean left atrial pressure No evidence of  left ventricular regional wall motion abnormalities.  2. The right ventricle has normal systolic function. The cavity was normal. There is no increase in right ventricular wall thickness. Right ventricular systolic pressure is mildly elevated with an estimated pressure of 36.8 mmHg.  3. Left atrial size was mildly dilated.  4. There is mild mitral annular calcification present. The MR jet is centrally-directed.  5. The aortic valve is tricuspid. Moderate thickening of the aortic valve. Mild calcification of the aortic valve. Mild-moderate stenosis of the aortic valve.  EKG:  EKG is not *** ordered today. The ekg ordered today demonstrates   Recent Labs: 04/08/2019: TSH 3.92   Lipid Panel    Component Value Date/Time   CHOL 141 05/06/2018 1203   TRIG 69 05/06/2018 1203   HDL 50 05/06/2018 1203   CHOLHDL 3.1 08/07/2017 1003   CHOLHDL 3.3 06/28/2016 0930   VLDL 15 06/28/2016 0930   LDLCALC 77 05/06/2018 1203     Wt Readings from Last 3 Encounters:  04/09/19 211 lb 12.8 oz (96.1 kg)  04/08/19 212 lb 6.4 oz (96.3 kg)  11/08/18 203 lb 12.8 oz (92.4 kg)     Other studies Reviewed: Additional studies/ records that were reviewed today include: . Review of the above records demonstrates:   Assessment and Plan:   1. CAD s/p CABG without angina: No chest pain. She has had 1V CABG with LIMA to LAD. Last cath in 2013 with patent LIMA to LAD, minimal disease RCA, no disease Circumflex. Continue ASA and beta blocker. She is statin intolerant.    2. HTN: BP is well controlled.   3.  Hyperlipidemia: She refuses to take statins  due to muscle aches. She stopped Zetia due to cost. She has not wished to start Praluent or Repatha and has been seen in the lipid clinic. She was enrolled in the CLEAR trial. ***   4. Mitral regurgitation: Mild by echo 2020   5. Chronic diastolic CHF: Weight is stable. No evidence of volume overload on exam. Continue Lasix prn.   6. Aortic stenosis: Mild echo by echo in 2020.   Current medicines are reviewed at length with the patient today.  The patient does not have concerns regarding medicines.  The following changes have been made:  no change  Labs/ tests ordered today include:   No orders of the defined types were placed in this encounter.  Disposition:   FU with me in 12  months  Signed, Verne Carrow, MD 09/10/2019 8:24 AM    [facility] [address] [place], [place], [place]  [address] Phone: [phone]; Fax: [phone]

## 2019-09-11 ENCOUNTER — Ambulatory Visit: Payer: Medicare HMO | Admitting: Cardiovascular Disease

## 2019-09-22 ENCOUNTER — Telehealth: Payer: Self-pay | Admitting: Cardiology

## 2019-09-22 ENCOUNTER — Encounter: Payer: Self-pay | Admitting: General Practice

## 2019-09-22 DIAGNOSIS — I10 Essential (primary) hypertension: Secondary | ICD-10-CM

## 2019-09-22 MED ORDER — METOPROLOL TARTRATE 100 MG PO TABS: 100.0000 mg | ORAL_TABLET | Freq: Two times a day (BID) | ORAL | 0 refills | Status: DC

## 2019-09-22 MED ORDER — LISINOPRIL 40 MG PO TABS: 40.0000 mg | ORAL_TABLET | Freq: Every day | ORAL | 0 refills | Status: DC

## 2019-09-22 NOTE — Telephone Encounter (Signed)
Pt's medications were sent to pt's pharmacy as requested. escribed failed. I had to called in a verbal order for lisinopril 40 mg tablet and metoprolol tartrate 100 mg tablet to [facility] mail order [facility]. Pharmacist verbalized understanding.

## 2019-09-22 NOTE — Telephone Encounter (Signed)
New Message     *STAT* If patient is at the pharmacy, call can be transferred to refill team.   1. Which medications need to be refilled? (please list name of each medication and dose if known) metoprolol tartrate (LOPRESSOR) 100 MG tablet lisinopril (PRINIVIL,ZESTRIL) 40 MG tablet  2. Which pharmacy/location (including street and city if local pharmacy) is medication to be sent to? [facility] [place], [place] - [address] AT Portal to [facility]  3. Do they need a 30 day or 90 day supply? 90

## 2019-09-24 ENCOUNTER — Other Ambulatory Visit: Payer: Self-pay

## 2019-09-24 ENCOUNTER — Telehealth: Payer: Self-pay

## 2019-09-24 ENCOUNTER — Encounter: Payer: Self-pay | Admitting: Cardiology

## 2019-09-24 ENCOUNTER — Telehealth (INDEPENDENT_AMBULATORY_CARE_PROVIDER_SITE_OTHER): Payer: Medicare HMO | Admitting: Cardiology

## 2019-09-24 VITALS — Ht 62.0 in

## 2019-09-24 DIAGNOSIS — N189 Chronic kidney disease, unspecified: Secondary | ICD-10-CM

## 2019-09-24 DIAGNOSIS — I2510 Atherosclerotic heart disease of native coronary artery without angina pectoris: Secondary | ICD-10-CM | POA: Diagnosis not present

## 2019-09-24 DIAGNOSIS — I350 Nonrheumatic aortic (valve) stenosis: Secondary | ICD-10-CM

## 2019-09-24 DIAGNOSIS — I340 Nonrheumatic mitral (valve) insufficiency: Secondary | ICD-10-CM | POA: Diagnosis not present

## 2019-09-24 DIAGNOSIS — E7800 Pure hypercholesterolemia, unspecified: Secondary | ICD-10-CM | POA: Diagnosis not present

## 2019-09-24 DIAGNOSIS — I5032 Chronic diastolic (congestive) heart failure: Secondary | ICD-10-CM | POA: Diagnosis not present

## 2019-09-24 DIAGNOSIS — I10 Essential (primary) hypertension: Secondary | ICD-10-CM | POA: Diagnosis not present

## 2019-09-24 NOTE — Patient Instructions (Addendum)
Medication Instructions:  Your physician recommends that you continue on your current medications as directed. Please refer to the Current Medication list given to you today.  *If you need a refill on your cardiac medications before your next appointment, please call your pharmacy*  Lab Work: None ordered  If you have labs (blood work) drawn today and your tests are completely normal, you will receive your results only by: Marland Kitchen MyChart Message (if you have MyChart) OR . A paper copy in the mail If you have any lab test that is abnormal or we need to change your treatment, we will call you to review the results.  Testing/Procedures: None ordered  Follow-Up: At [facility], you and your health needs are our priority.  As part of our continuing mission to provide you with exceptional heart care, we have created designated Provider Care Teams.  These Care Teams include your primary Cardiologist (physician) and Advanced Practice Providers (APPs -  Physician Assistants and Nurse Practitioners) who all work together to provide you with the care you need, when you need it.  Your next appointment:   6 month(s)  The format for your next appointment:   In Person  Provider:   You may see Verne Carrow, MD or one of the following Advanced Practice Providers on your designated Care Team:    Ronie Spies, PA-C  Jacolyn Reedy, PA-C   Other Instructions  Lifestyle Modifications to Prevent and Treat Heart Disease -Recommend heart healthy/Mediterranean diet, with whole grains, fruits, vegetables, fish, lean meats, nuts, olive oil and avocado oil.  -Limit salt intake to less than 2000 mg per day.  -Recommend moderate walking, starting slowly with a few minutes and working up to 3-5 times/week for 30-50 minutes each session. Aim for at least 150 minutes.week. Goal should be pace of 3 miles/hours, or walking 1.5 miles in 30 minutes -Recommend avoidance of tobacco products. Avoid excess  alcohol. -Keep blood pressure well controlled, ideally less than 130/80.

## 2019-09-24 NOTE — Telephone Encounter (Signed)
Spoke with pt and she gave verbal consent for phone visit. Pt did not have the capability to do her vitals today. Pt reports that she has an upcoming appt with the drug study and they will do her bp, and labs at the end of this month.    YOUR CARDIOLOGY TEAM HAS ARRANGED FOR AN E-VISIT FOR YOUR APPOINTMENT - PLEASE REVIEW IMPORTANT INFORMATION BELOW SEVERAL DAYS PRIOR TO YOUR APPOINTMENT  Due to the recent COVID-19 pandemic, we are transitioning in-person office visits to tele-medicine visits in an effort to decrease unnecessary exposure to our patients, their families, and staff. These visits are billed to your insurance just like a normal visit is. We also encourage you to sign up for MyChart if you have not already done so. You will need a smartphone if possible. For patients that do not have this, we can still complete the visit using a regular telephone but do prefer a smartphone to enable video when possible. You may have a family member that lives with you that can help. If possible, we also ask that you have a blood pressure cuff and scale at home to measure your blood pressure, heart rate and weight prior to your scheduled appointment. Patients with clinical needs that need an in-person evaluation and testing will still be able to come to the office if absolutely necessary. If you have any questions, feel free to call our office.     YOUR PROVIDER WILL BE USING THE FOLLOWING PLATFORM TO COMPLETE YOUR VISIT: Phone Call . IF USING MYCHART - How to Download the MyChart App to Your SmartPhone   - If Apple, go to [employer] and type in MyChart in the search bar and download the app. If Android, ask patient to go to [employer] and type in [place] in the search bar and download the app. The app is free but as with any other app downloads, your phone may require you to verify saved payment information or Apple/Android password.  - You will need to then log into the app with your MyChart username  and password, and select [facility] as your healthcare provider to link the account.  - When it is time for your visit, go to the MyChart app, find appointments, and click Begin Video Visit. Be sure to Select Allow for your device to access the Microphone and Camera for your visit. You will then be connected, and your provider will be with you shortly.  **If you have any issues connecting or need assistance, please contact MyChart service desk [phone])**  **If using a computer, in order to ensure the best quality for your visit, you will need to use either of the following Internet Browsers: [employer] or [employer]**  . IF USING DOXIMITY or [email or web address].ME - The staff will give you instructions on receiving your link to join the meeting the day of your visit.      2-3 DAYS BEFORE YOUR APPOINTMENT  You will receive a telephone call from one of our [facility] team members - your caller ID may say "Unknown caller." If this is a video visit, we will walk you through how to get the video launched on your phone. We will remind you check your blood pressure, heart rate and weight prior to your scheduled appointment. If you have an Apple Watch or Kardia, please upload any pertinent ECG strips the day before or morning of your appointment to MyChart. Our staff will also make sure you have  reviewed the consent and agree to move forward with your scheduled tele-health visit.     THE DAY OF YOUR APPOINTMENT  Approximately 15 minutes prior to your scheduled appointment, you will receive a telephone call from one of Heart[facility] team - your caller ID may say "Unknown caller."  Our staff will confirm medications, vital signs for the day and any symptoms you may be experiencing. Please have this information available prior to the time of visit start. It may also be helpful for you to have a pad of paper and pen handy for any instructions given during your visit. They will also walk you through  joining the smartphone meeting if this is a video visit.    CONSENT FOR TELE-HEALTH VISIT - PLEASE REVIEW  I hereby voluntarily request, consent and authorize [facility] and its employed or contracted physicians, physician assistants, nurse practitioners or other licensed health care professionals (the Practitioner), to provide me with telemedicine health care services (the "Services") as deemed necessary by the treating Practitioner. I acknowledge and consent to receive the Services by the Practitioner via telemedicine. I understand that the telemedicine visit will involve communicating with the Practitioner through live audiovisual communication technology and the disclosure of certain medical information by electronic transmission. I acknowledge that I have been given the opportunity to request an in-person assessment or other available alternative prior to the telemedicine visit and am voluntarily participating in the telemedicine visit.  I understand that I have the right to withhold or withdraw my consent to the use of telemedicine in the course of my care at any time, without affecting my right to future care or treatment, and that the Practitioner or I may terminate the telemedicine visit at any time. I understand that I have the right to inspect all information obtained and/or recorded in the course of the telemedicine visit and may receive copies of available information for a reasonable fee.  I understand that some of the potential risks of receiving the Services via telemedicine include:  Marland Kitchen Delay or interruption in medical evaluation due to technological equipment failure or disruption; . Information transmitted may not be sufficient (e.g. poor resolution of images) to allow for appropriate medical decision making by the Practitioner; and/or  . In rare instances, security protocols could fail, causing a breach of personal health information.  Furthermore, I acknowledge that it is my  responsibility to provide information about my medical history, conditions and care that is complete and accurate to the best of my ability. I acknowledge that Practitioner's advice, recommendations, and/or decision may be based on factors not within their control, such as incomplete or inaccurate data provided by me or distortions of diagnostic images or specimens that may result from electronic transmissions. I understand that the practice of medicine is not an exact science and that Practitioner makes no warranties or guarantees regarding treatment outcomes. I acknowledge that I will receive a copy of this consent concurrently upon execution via email to the email address I last provided but may also request a printed copy by calling the office of [facility].    I understand that my insurance will be billed for this visit.   I have read or had this consent read to me. . I understand the contents of this consent, which adequately explains the benefits and risks of the Services being provided via telemedicine.  . I have been provided ample opportunity to ask questions regarding this consent and the Services and have had my questions answered  to my satisfaction. . I give my informed consent for the services to be provided through the use of telemedicine in my medical care  By participating in this telemedicine visit I agree to the above.

## 2019-09-24 NOTE — Progress Notes (Signed)
Virtual Visit via Telephone Note   This visit type was conducted due to national recommendations for restrictions regarding the COVID-19 Pandemic (e.g. social distancing) in an effort to limit this patient's exposure and mitigate transmission in our community.  Due to her co-morbid illnesses, this patient is at least at moderate risk for complications without adequate follow up.  This format is felt to be most appropriate for this patient at this time.  The patient did not have access to video technology/had technical difficulties with video requiring transitioning to audio format only (telephone).  All issues noted in this document were discussed and addressed.  No physical exam could be performed with this format.  Please refer to the patient's chart for her  consent to telehealth for [facility].   Date:  09/24/2019   ID:  Samantha Short, DOB 1946/06/18, MRN [id number]  Patient Location: Home Provider Location: Home  PCP:  Mila Palmer, MD  Cardiologist:  Verne Carrow, MD  Electrophysiologist:  None   Evaluation Performed:  Follow-Up Visit  Chief Complaint: Follow-up of his CAD  History of Present Illness:    Samantha Short is a 74 y.o. female with history of CAD s/p 1V CABG in 2010, hypertension, hyperlipidemia, GERD and mitral regurgitation.  Notes indicate that the patient is intolerant to statins and has not tolerated amlodipine in the past.  Hydrochlorothiazide was stopped due to renal function.  She has been treated with Lasix.  She stopped Zetia due to cost.  She has not wished to consider a PCSK9 inhibitor.  Echocardiogram in 2017 showed normal LVEF 55-60%, grade 2 diastolic dysfunction, mild aortic stenosis and mild mitral regurgitation.  She is followed by nephrology who held her Lasix in 2019.  She has a history of hyperthyroidism status post ablation.  Followed by Dr. Everardo All.  Ms. Marzo was last seen in the office on 08/12/2018 by Dr. Clifton James.  She was  noted to be very active, exercising and swimming with no chest pain.  She did have a few chest pains with no associated symptoms, felt to be atypical.  Patient is being seen today for annual follow-up. She lives with her daughter and grandson. She is not as active due to the pandemic and gym being closed. She does yardwork and gardening. She was out yesterday.   She has mild swelling but she says nothing to be concerned about. No chest pain. She has some mild DOE, her normal. She denies orthopne or PND.   Her BP was up and down. Nephroloy increased her lasix to 40 mg but she was getting dehydrated. She did not tolerated HCTZ. She put herself on lasix 20 g but she had an episode of Her leg and foot turned purple for about 2 weeks attributed to eating a lot of tomato's. She went back on lasix 40 mg daily.  It resolved. She is feeling well with this now.    Has nephrology in Feb.     The patient does not have symptoms concerning for COVID-19 infection (fever, chills, cough, or new shortness of breath).    Past Medical History:  Diagnosis Date  . Asthma   . Chronic diastolic CHF (congestive heart failure) (HCC)    a. 02/2011 Echo: EF 55-60%, Gr2 DD, Mild MR  . Chronic kidney disease   . Coronary artery disease    a. s/p CABG x 1 2010:  LIMA->LAD.;  b. amdx for CP => LHC 07/04/12: LAD 70-80%, mid RCA 30%, LIMA-LAD patent with  competitive flow limiting distal LAD filling, EF 70% with hyperdynamic LV function. Medical therapy continued.  . Dyslipidemia   . GERD (gastroesophageal reflux disease)   . Hyperlipidemia   . Hypertension   . Hyperthyroidism   . Mitral regurgitation    a. mild by echo 02/2011.   Past Surgical History:  Procedure Laterality Date  . ABDOMINAL HYSTERECTOMY  1980  . CARDIAC CATHETERIZATION  07/27/09 & 07/28/09  . CESAREAN SECTION    . CESAREAN SECTION    . CORONARY ARTERY BYPASS GRAFT  08/03/2009   x1 using left internal mammary artery to distal left anterior   descending coronary artery.   Marland Kitchen GALLBLADDER SURGERY  2001  . LEFT HEART CATHETERIZATION WITH CORONARY/GRAFT ANGIOGRAM N/A 07/05/2012   Procedure: LEFT HEART CATHETERIZATION WITH Isabel Caprice;  Surgeon: Wendall Stade, MD;  Location: [facility] CATH LAB;  Service: Cardiovascular;  Laterality: N/A;  . SHOULDER SURGERY  1998 / 2001   from accident  . VESICOVAGINAL FISTULA CLOSURE W/ TAH  1998     Current Meds  Medication Sig  . aspirin 81 MG tablet Take 81 mg by mouth at bedtime.   . Cholecalciferol (VITAMIN D) 50 MCG (2000 UT) CAPS Take 1 capsule by mouth 2 (two) times daily.  . furosemide (LASIX) 40 MG tablet Take 40 mg by mouth.  . Investigational - Study Medication Take 180 mg by mouth See admin instructions. Unnamed study/Medication Name/alternative to statins: Bempedoic Acid 180 mg vs placebo, study drug provided: Take 180 mg by mouth once a day  . lisinopril (ZESTRIL) 40 MG tablet Take 1 tablet (40 mg total) by mouth daily. Please keep upcoming appt in January before anymore refills. Thank you  . loratadine (CLARITIN) 10 MG tablet Take 10 mg by mouth daily as needed.   . metoprolol tartrate (LOPRESSOR) 100 MG tablet Take 1 tablet (100 mg total) by mouth 2 (two) times daily. Please keep upcoming appt in January before anymore refills. Thank you  . Multiple Vitamin (MULTIVITAMIN WITH MINERALS) TABS tablet Take 1 tablet by mouth daily.  . Multiple Vitamins-Minerals (OCUVITE ADULT 50+ PO) Take 1 capsule by mouth 2 (two) times daily.      Allergies:   Ceclor [cefaclor], Hctz [hydrochlorothiazide], Lipitor [atorvastatin calcium], Norvasc [amlodipine], Penicillins, Sulfa antibiotics, Statins, Erythromycin, Latex, Levofloxacin, and Tetracyclines & related   Social History   Tobacco Use  . Smoking status: Former Smoker    Years: 7.00    Types: Cigarettes    Quit date: 07/12/2010    Years since quitting: 9.2  . Smokeless tobacco: Never Used  Substance Use Topics  . Alcohol use: No  .  Drug use: No     Family Hx: The patient's family history includes Alcoholism in her father; Angina in her sister; Cancer in her brother, father, and sister; Coronary artery disease in her son; Heart attack in her father and mother; Heart disease in her brother, father, mother, and sister; High Cholesterol in her father and mother; High blood pressure in her father and mother; Hypertension in her brother and sister; Obesity in her father and mother. There is no history of Thyroid disease.  ROS:   Please see the history of present illness.     All other systems reviewed and are negative.   Prior CV studies:   The following studies were reviewed today:  Echocardiogram 01/13/2019 IMPRESSIONS   1. The left ventricle has normal systolic function with an ejection fraction of 60-65%. The cavity size was normal. Left ventricular  diastolic Doppler parameters are consistent with pseudonormalization. Elevated mean left atrial pressure No evidence of  left ventricular regional wall motion abnormalities.  2. The right ventricle has normal systolic function. The cavity was normal. There is no increase in right ventricular wall thickness. Right ventricular systolic pressure is mildly elevated with an estimated pressure of 36.8 mmHg.  3. Left atrial size was mildly dilated.  4. There is mild mitral annular calcification present. The MR jet is centrally-directed.  5. The aortic valve is tricuspid. Moderate thickening of the aortic valve. Mild calcification of the aortic valve. Mild-moderate stenosis of the aortic valve.   Labs/Other Tests and Data Reviewed:    EKG:  An ECG dated 05/23/18 was personally reviewed today and demonstrated:  Normal sinus rhythm with RBBB, 66 bpm  Recent Labs: 04/08/2019: TSH 3.92   Recent Lipid Panel Lab Results  Component Value Date/Time   CHOL 141 05/06/2018 12:03 PM   TRIG 69 05/06/2018 12:03 PM   HDL 50 05/06/2018 12:03 PM   CHOLHDL 3.1 08/07/2017 10:03 AM    CHOLHDL 3.3 06/28/2016 09:30 AM   LDLCALC 77 05/06/2018 12:03 PM    Wt Readings from Last 3 Encounters:  04/09/19 211 lb 12.8 oz (96.1 kg)  04/08/19 212 lb 6.4 oz (96.3 kg)  11/08/18 203 lb 12.8 oz (92.4 kg)     Objective:    Vital Signs:  Ht 5\' 2"  (1.575 m)   BMI 38.74 kg/m    VITAL SIGNS:  reviewed GEN:  no acute distress RESPIRATORY:  No audible cough or wheeze over the phone.  Patient is able to speak in complete sentences. NEURO:  alert and oriented x 3, no obvious focal deficit PSYCH:  normal affect  ASSESSMENT & PLAN:    CAD s/p CABG in 2013 -Medical therapy includes aspirin, beta-blocker.  She is not on statin due to intolerance.  She is on a statin medication for cholesterol. -No anginal symptoms. -Have her follow-up in the office in 6 months with Dr. Angelena Form.  Hypertension -On lisinopril 40 mg daily, Lopressor 100 mg twice daily -Patient does not check blood pressure at home. -She has an appointment with nephrology in a few weeks.  CKD -Followed by nephrology, has an appointment in a couple of weeks.  Hyperlipidemia -Patient has been intolerant to statins in the past due to muscle aches.  She stopped the Zetia due to cost.  She has been offered PCSK9 inhibitor but declined.  She is in the CLEAR trial on a study medication which she says is now approved to the FDA. Pt reports cholesterol levels have been good.   Chronic diastolic CHF -Patient is on Lasix 40 mg daily, approved by nephrology. -Currently seems to be stable, asymptomatic.  Mitral regurgitation -Mild by echo in 2017.  Follow-up echo in 01/2019 stable with mild mitral regurgitation. -Continue to follow  Aortic stenosis -Mild by echo in 2017.  Follow-up echo in 01/2019 showed mild-moderate aortic stenosis with a calculated valve area of 1.24 cm and a mean gradient of 16 mmHg.  Normal EF.  Stable. -Continue to follow  COVID-19 Education: The signs and symptoms of COVID-19 were discussed with the  patient and how to seek care for testing (follow up with PCP or arrange E-visit).  The importance of social distancing was discussed today.  Time:   Today, I have spent 10 minutes with the patient with telehealth technology discussing the above problems.     Medication Adjustments/Labs and Tests Ordered: Current medicines are reviewed  at length with the patient today.  Concerns regarding medicines are outlined above.   Tests Ordered: No orders of the defined types were placed in this encounter.   Medication Changes: No orders of the defined types were placed in this encounter.   Follow Up:  In Person in 6 month(s)  Signed, Berton Bon, NP  09/24/2019 1:43 PM    [facility]

## 2019-10-06 ENCOUNTER — Encounter: Payer: Medicare HMO | Admitting: *Deleted

## 2019-10-07 ENCOUNTER — Other Ambulatory Visit: Payer: Self-pay

## 2019-10-09 ENCOUNTER — Ambulatory Visit (INDEPENDENT_AMBULATORY_CARE_PROVIDER_SITE_OTHER): Payer: Medicare HMO | Admitting: Endocrinology

## 2019-10-09 ENCOUNTER — Other Ambulatory Visit: Payer: Self-pay

## 2019-10-09 ENCOUNTER — Encounter: Payer: Self-pay | Admitting: Endocrinology

## 2019-10-09 DIAGNOSIS — E0590 Thyrotoxicosis, unspecified without thyrotoxic crisis or storm: Secondary | ICD-10-CM | POA: Diagnosis not present

## 2019-10-09 NOTE — Patient Instructions (Signed)
Blood tests are requested for you today.  We'll let you know about the results.   If you have blood drawn at the hospital, please verify that the thyroid is included.   Please come back for a follow-up appointment in 6 months.

## 2019-10-09 NOTE — Progress Notes (Signed)
Subjective:    Patient ID: Samantha Short, female    DOB: 24-Mar-1946, 74 y.o.   MRN: [id number]  HPI  telehealth visit today via phone x 6 minutes.  Alternatives to telehealth are presented to this patient, and the patient agrees to the telehealth visit. Pt is advised of the cost of the visit, and agrees to this, also.   Patient is at home, and I am at the office.   Persons attending the telehealth visit: the patient and I Pt returns for f/u of hyperthyroidism (dx'ed 2019; CT (2008) showed small thyroid cysts; nuc med scan was c/w multinodular goiter; she had RAI 4/19; she became euthyroid a few mos later; she has stayed euthyroid off rx since then).  pt states she feels well in general.  Specifically, she denies palpitations and tremor.   Past Medical History:  Diagnosis Date  . Asthma   . Chronic diastolic CHF (congestive heart failure) (HCC)    a. 02/2011 Echo: EF 55-60%, Gr2 DD, Mild MR  . Chronic kidney disease   . Coronary artery disease    a. s/p CABG x 1 2010:  LIMA->LAD.;  b. amdx for CP => LHC 07/04/12: LAD 70-80%, mid RCA 30%, LIMA-LAD patent with competitive flow limiting distal LAD filling, EF 70% with hyperdynamic LV function. Medical therapy continued.  . Dyslipidemia   . GERD (gastroesophageal reflux disease)   . Hyperlipidemia   . Hypertension   . Hyperthyroidism   . Mitral regurgitation    a. mild by echo 02/2011.    Past Surgical History:  Procedure Laterality Date  . ABDOMINAL HYSTERECTOMY  1980  . CARDIAC CATHETERIZATION  07/27/09 & 07/28/09  . CESAREAN SECTION    . CESAREAN SECTION    . CORONARY ARTERY BYPASS GRAFT  08/03/2009   x1 using left internal mammary artery to distal left anterior  descending coronary artery.   Marland Kitchen GALLBLADDER SURGERY  2001  . LEFT HEART CATHETERIZATION WITH CORONARY/GRAFT ANGIOGRAM N/A 07/05/2012   Procedure: LEFT HEART CATHETERIZATION WITH Isabel Caprice;  Surgeon: Wendall Stade, MD;  Location: [facility] CATH LAB;  Service:  Cardiovascular;  Laterality: N/A;  . SHOULDER SURGERY  1998 / 2001   from accident  . VESICOVAGINAL FISTULA CLOSURE W/ TAH  1998    Social History   Socioeconomic History  . Marital status: Widowed    Spouse name: Not on file  . Number of children: 3  . Years of education: Not on file  . Highest education level: Not on file  Occupational History  . Occupation: Retired- Engineer, site  Tobacco Use  . Smoking status: Former Smoker    Years: 7.00    Types: Cigarettes    Quit date: 07/12/2010    Years since quitting: 9.2  . Smokeless tobacco: Never Used  Substance and Sexual Activity  . Alcohol use: No  . Drug use: No  . Sexual activity: Not on file  Other Topics Concern  . Not on file  Social History Narrative  . Not on file   Social Determinants of Health   Financial Resource Strain:   . Difficulty of Paying Living Expenses: Not on file  Food Insecurity:   . Worried About Programme researcher, broadcasting/film/video in the Last Year: Not on file  . Ran Out of Food in the Last Year: Not on file  Transportation Needs:   . Lack of Transportation (Medical): Not on file  . Lack of Transportation (Non-Medical): Not on file  Physical Activity:   .  Days of Exercise per Week: Not on file  . Minutes of Exercise per Session: Not on file  Stress:   . Feeling of Stress : Not on file  Social Connections:   . Frequency of Communication with Friends and Family: Not on file  . Frequency of Social Gatherings with Friends and Family: Not on file  . Attends Religious Services: Not on file  . Active Member of Clubs or Organizations: Not on file  . Attends Banker Meetings: Not on file  . Marital Status: Not on file  Intimate Partner Violence:   . Fear of Current or Ex-Partner: Not on file  . Emotionally Abused: Not on file  . Physically Abused: Not on file  . Sexually Abused: Not on file    Current Outpatient Medications on File Prior to Visit  Medication Sig Dispense Refill  . aspirin 81  MG tablet Take 81 mg by mouth at bedtime.     . Cholecalciferol (VITAMIN D) 50 MCG (2000 UT) CAPS Take 1 capsule by mouth 2 (two) times daily.    . furosemide (LASIX) 40 MG tablet Take 40 mg by mouth.    . Investigational - Study Medication Take 180 mg by mouth See admin instructions. Unnamed study/Medication Name/alternative to statins: Bempedoic Acid 180 mg vs placebo, study drug provided: Take 180 mg by mouth once a day    . lisinopril (ZESTRIL) 40 MG tablet Take 1 tablet (40 mg total) by mouth daily. Please keep upcoming appt in January before anymore refills. Thank you 90 tablet 0  . loratadine (CLARITIN) 10 MG tablet Take 10 mg by mouth daily as needed.     . metoprolol tartrate (LOPRESSOR) 100 MG tablet Take 1 tablet (100 mg total) by mouth 2 (two) times daily. Please keep upcoming appt in January before anymore refills. Thank you 180 tablet 0  . Multiple Vitamin (MULTIVITAMIN WITH MINERALS) TABS tablet Take 1 tablet by mouth daily.    . Multiple Vitamins-Minerals (OCUVITE ADULT 50+ PO) Take 1 capsule by mouth 2 (two) times daily.      No current facility-administered medications on file prior to visit.    Allergies  Allergen Reactions  . Ceclor [Cefaclor] Other (See Comments)    Lost vision in eye  . Hctz [Hydrochlorothiazide] Other (See Comments)    Renal insufficiency  . Lipitor [Atorvastatin Calcium] Other (See Comments)    Increased A1C  . Norvasc [Amlodipine] Other (See Comments)    Makes patient stiff  . Penicillins Hives, Shortness Of Breath, Itching and Rash    Has patient had a PCN reaction causing immediate rash, facial/tongue/throat swelling, SOB or lightheadedness with hypotension: Yes Has patient had a PCN reaction causing severe rash involving mucus membranes or skin necrosis: Unk Has patient had a PCN reaction that required hospitalization: No Has patient had a PCN reaction occurring within the last 10 years: No If all of the above answers are "NO", then may proceed  with Cephalosporin use.   . Sulfa Antibiotics Other (See Comments)    CAUSES SHOCK  . Statins Other (See Comments)    MYALGIAS AND WEAKNESS  . Erythromycin Rash  . Latex Rash  . Levofloxacin Rash  . Tetracyclines & Related Rash    Family History  Problem Relation Age of Onset  . Cancer Father   . Heart attack Father   . High blood pressure Father   . High Cholesterol Father   . Heart disease Father   . Alcoholism Father   .  Obesity Father   . Heart attack Mother   . Heart disease Mother        had pacemaker  . High blood pressure Mother   . High Cholesterol Mother   . Obesity Mother   . Cancer Brother   . Heart disease Brother   . Heart disease Sister   . Cancer Sister   . Angina Sister   . Coronary artery disease Son   . Hypertension Sister   . Hypertension Brother   . Thyroid disease Neg Hx     There were no vitals taken for this visit.   Review of Systems Denies neck swelling    Objective:   Physical Exam      Assessment & Plan:  Hyperthyroidism, s/p RAI rx.  Due for recheck CHF: in this setting, she needs to maintain euthyroidism.  I encouraged pt not to wait long before rechecking TFT  Patient Instructions  Blood tests are requested for you today.  We'll let you know about the results.   If you have blood drawn at the hospital, please verify that the thyroid is included.   Please come back for a follow-up appointment in 6 months.

## 2019-10-10 ENCOUNTER — Encounter: Payer: Medicare HMO | Admitting: *Deleted

## 2019-10-10 ENCOUNTER — Other Ambulatory Visit: Payer: Self-pay

## 2019-10-10 VITALS — BP 133/63 | HR 65

## 2019-10-10 DIAGNOSIS — Z006 Encounter for examination for normal comparison and control in clinical research program: Secondary | ICD-10-CM

## 2019-10-10 NOTE — Research (Signed)
Patient came to clinic for vist T10 M24 for Clear. No AE's or SAE's to report. Medications brought back and new medications dispensed. Appointment for next visit made.

## 2019-10-16 ENCOUNTER — Encounter: Payer: Medicare HMO | Admitting: *Deleted

## 2019-10-16 ENCOUNTER — Other Ambulatory Visit (HOSPITAL_COMMUNITY): Payer: Self-pay

## 2019-10-16 DIAGNOSIS — Z006 Encounter for examination for normal comparison and control in clinical research program: Secondary | ICD-10-CM

## 2019-10-16 NOTE — Research (Signed)
This patient is enrolled in the Clear Research Study. Routine lab work came back with a potassium level of 5.3.  Patient back to clinic for lab re-draw and we will be processing this locally.  She would like for the results to be faxed to her nephrologist at [facility] for follow-up.  Also, drawing a TSH and having processed through the Clear study.  She would like the results to be faxed to Dr. Romero Belling, [facility] endocrinology for follow up.

## 2019-10-16 NOTE — Addendum Note (Signed)
Addended by: Mercer Pod D on: 10/16/2019 09:30 AM   Modules accepted: Orders

## 2019-10-17 ENCOUNTER — Encounter: Payer: Self-pay | Admitting: *Deleted

## 2019-10-17 DIAGNOSIS — Z006 Encounter for examination for normal comparison and control in clinical research program: Secondary | ICD-10-CM

## 2019-10-17 LAB — BASIC METABOLIC PANEL
BUN/Creatinine Ratio: 18 (ref 12–28)
BUN: 23 mg/dL (ref 8–27)
CO2: 23 mmol/L (ref 20–29)
Calcium: 9.6 mg/dL (ref 8.7–10.3)
Chloride: 103 mmol/L (ref 96–106)
Creatinine, Ser: 1.28 mg/dL — ABNORMAL HIGH (ref 0.57–1.00)
GFR calc Af Amer: 48 mL/min/{1.73_m2} — ABNORMAL LOW (ref >59)
GFR calc non Af Amer: 42 mL/min/{1.73_m2} — ABNORMAL LOW (ref >59)
Glucose: 96 mg/dL (ref 65–99)
Potassium: 4.5 mmol/L (ref 3.5–5.2)
Sodium: 141 mmol/L (ref 134–144)

## 2019-10-17 NOTE — Research (Signed)
Called pt with results of BMET. Pt requested that the labs be faxed to [facility].Faxed results to Dr. Glenna Fellows at [facility].

## 2019-10-20 ENCOUNTER — Telehealth: Payer: Self-pay | Admitting: *Deleted

## 2019-10-20 ENCOUNTER — Telehealth: Payer: Self-pay

## 2019-10-20 DIAGNOSIS — Z006 Encounter for examination for normal comparison and control in clinical research program: Secondary | ICD-10-CM

## 2019-10-20 NOTE — Telephone Encounter (Signed)
Patient is calling back saying she got her results back and her levels are extremely elevated. Patient is requesting to speak with Dr Henderson Cloud nurse. Ph# is [phone].

## 2019-10-20 NOTE — Telephone Encounter (Signed)
As states below, lab results have NOT been received. Therefore, advice cannot be provided to pt until results have been received and reviewed by Dr. Everardo All.

## 2019-10-20 NOTE — Telephone Encounter (Signed)
Patient called for results from last weeks blood draw.

## 2019-10-20 NOTE — Telephone Encounter (Signed)
At the time of this entry, lab results have not been received. Will await lab results. Will address pt concerns one lab results have been received AND addressed by Dr. Everardo All.

## 2019-10-20 NOTE — Telephone Encounter (Signed)
Called pt with results of TSH. Per pt request, labs be sent to Dr. Everardo All.

## 2019-10-20 NOTE — Telephone Encounter (Signed)
Patient called today to report high thyroid lab result but she did not know exactly which lab result it was-I informed the patient that we would need to know which one it was-patient has questions about the high result and what it means for her health-patient stated she would call back once she knew what lab was drawn-FYI

## 2019-10-21 NOTE — Telephone Encounter (Signed)
Patient called again this morning asking to speak to Dr and I did tell her we have not gotten results as of yet and will call as soon as we get them back.

## 2019-10-21 NOTE — Telephone Encounter (Signed)
Received Teams message advising pt was on the phone and that she would not get off the phone until she spoke to Dr. George Hugh nurse. Immediately called pt and advised her that we still have not received any lab results on either fax machine to date. Before I could finish my sentence, pt hastily stated, "well if you didn't cancel my appointment due to bad weather that NEVER (pt emphasized this word) occurred", (pt paused) then sarcastically laughed. Advised pt that unfortunately for our patients safety, administration made the decision to CHANGE appts to virtual visits rather than cancel appts. Continued to advise if her appt was canceled, this would have been by her choice as the option would have been provided to convert to a virtual visit rather than to cancel. Pt then interjected and stated, "well I was on hold for 30 minutes yesterday and today but I guess that is my problem." Redirected pt and reminded that the nature of my call was to inform her that lab results have not been received, therefore there isn't advice to be given at this time. Also reminded pt that she is welcome to call her provider that obtained the labs and remind them to fax the results to our office so that we may address her concerns in a timely manner. Again emphasized to pt that the hold up and her frustration is not and should not be directed at our office but rather with the office who has yet to provide [place] with. Reassured pt that once these results have been received AND reviewed by Dr. Everardo All, she WILL receive a call with further advice and instruction. Until then, there is nothing to report at this time without lab results. Pt then disconnected.

## 2019-10-23 ENCOUNTER — Other Ambulatory Visit (INDEPENDENT_AMBULATORY_CARE_PROVIDER_SITE_OTHER): Payer: Medicare HMO

## 2019-10-23 ENCOUNTER — Other Ambulatory Visit: Payer: Self-pay | Admitting: Endocrinology

## 2019-10-23 ENCOUNTER — Other Ambulatory Visit: Payer: Self-pay

## 2019-10-23 DIAGNOSIS — E0590 Thyrotoxicosis, unspecified without thyrotoxic crisis or storm: Secondary | ICD-10-CM

## 2019-10-23 LAB — T4, FREE: Free T4: 0.97 ng/dL (ref 0.60–1.60)

## 2019-10-23 LAB — TSH: TSH: 12.86 u[IU]/mL — ABNORMAL HIGH (ref 0.35–4.50)

## 2019-10-23 MED ORDER — LEVOTHYROXINE SODIUM 75 MCG PO TABS: 75.0000 ug | ORAL_TABLET | Freq: Every day | ORAL | 2 refills | Status: DC

## 2019-11-04 DIAGNOSIS — N2581 Secondary hyperparathyroidism of renal origin: Secondary | ICD-10-CM | POA: Diagnosis not present

## 2019-11-04 DIAGNOSIS — N2589 Other disorders resulting from impaired renal tubular function: Secondary | ICD-10-CM | POA: Diagnosis not present

## 2019-11-04 DIAGNOSIS — I129 Hypertensive chronic kidney disease with stage 1 through stage 4 chronic kidney disease, or unspecified chronic kidney disease: Secondary | ICD-10-CM | POA: Diagnosis not present

## 2019-11-04 DIAGNOSIS — N183 Chronic kidney disease, stage 3 unspecified: Secondary | ICD-10-CM | POA: Diagnosis not present

## 2019-11-14 ENCOUNTER — Other Ambulatory Visit: Payer: Self-pay | Admitting: Endocrinology

## 2019-11-14 NOTE — Telephone Encounter (Signed)
Please advise. Does not appear pt has had labs completed per your instructions below:  Hi Ms, Samantha Short: Thyroid has gone low.  I have sent a prescription to your pharmacy, for a thyroid hormone pill.  Please recheck the blood tests in approx 1 month.  Please call ahead, so the lab lady can expect you.  I'll see you next time.

## 2019-11-14 NOTE — Telephone Encounter (Signed)
please refill x 1 No further refill until blood is drawn

## 2019-11-18 ENCOUNTER — Other Ambulatory Visit: Payer: Self-pay

## 2019-11-18 ENCOUNTER — Encounter: Payer: Self-pay | Admitting: Endocrinology

## 2019-11-18 ENCOUNTER — Other Ambulatory Visit (INDEPENDENT_AMBULATORY_CARE_PROVIDER_SITE_OTHER): Payer: Medicare HMO

## 2019-11-18 DIAGNOSIS — E0590 Thyrotoxicosis, unspecified without thyrotoxic crisis or storm: Secondary | ICD-10-CM

## 2019-11-18 LAB — TSH: TSH: 0.27 u[IU]/mL — ABNORMAL LOW (ref 0.35–4.50)

## 2019-11-18 LAB — T4, FREE: Free T4: 1.59 ng/dL (ref 0.60–1.60)

## 2019-11-19 ENCOUNTER — Other Ambulatory Visit: Payer: Self-pay | Admitting: Endocrinology

## 2019-11-19 MED ORDER — LEVOTHYROXINE SODIUM 50 MCG PO TABS: 50.0000 ug | ORAL_TABLET | Freq: Every day | ORAL | 1 refills | Status: DC

## 2019-12-10 ENCOUNTER — Other Ambulatory Visit: Payer: Self-pay | Admitting: Endocrinology

## 2019-12-23 DIAGNOSIS — M859 Disorder of bone density and structure, unspecified: Secondary | ICD-10-CM | POA: Diagnosis not present

## 2019-12-23 DIAGNOSIS — I25118 Atherosclerotic heart disease of native coronary artery with other forms of angina pectoris: Secondary | ICD-10-CM | POA: Diagnosis not present

## 2019-12-23 DIAGNOSIS — R42 Dizziness and giddiness: Secondary | ICD-10-CM | POA: Diagnosis not present

## 2019-12-23 DIAGNOSIS — E039 Hypothyroidism, unspecified: Secondary | ICD-10-CM | POA: Diagnosis not present

## 2019-12-23 DIAGNOSIS — N183 Chronic kidney disease, stage 3 unspecified: Secondary | ICD-10-CM | POA: Diagnosis not present

## 2019-12-24 ENCOUNTER — Telehealth: Payer: Self-pay | Admitting: Cardiovascular Disease

## 2019-12-24 NOTE — Telephone Encounter (Signed)
Patient was recently diagnosed with a thyroid condition. She would like to speak with Dr. Clifton James and his nurse to discuss this new diagnosis.

## 2019-12-24 NOTE — Telephone Encounter (Signed)
Left message for patient to call back  

## 2019-12-25 ENCOUNTER — Encounter: Payer: Self-pay | Admitting: Endocrinology

## 2019-12-25 NOTE — Telephone Encounter (Signed)
Called pt in response to her MyChart message.  Pt reports she is feeling fatigued, no energy after taking meds in AM.  Wants to know if metoprolol can be reduced or changed.  Tuesday when she stood at up felt like she might pass out and had to sit down.  This passed.  No home BP/HR readings.  At PCP last - 120/80. She gets SOB easily and blurry vision.  Neither of these are new symptoms.  [facility]   Pt has started on levothyroxine by endocrine after having radiated thyroid previously. Patient states that she and her PCP are in agreement that her thyroid medicine and metoprolol could be interacting causing symptoms.   Her questions are related to reducing or changing metoprolol because SOB, hoarseness, fatigue and blurry vision have worsened since starting levothyroxine and her pharmacist told her there could be an rare interaction.  Scheduled her w Dr. Clifton James tomorrow to discuss all symptoms/concerns further.  Taking same dose of metoprolol since 2018.

## 2019-12-25 NOTE — Telephone Encounter (Signed)
Thanks

## 2019-12-26 ENCOUNTER — Ambulatory Visit: Payer: Medicare HMO | Admitting: Cardiovascular Disease

## 2019-12-26 ENCOUNTER — Encounter: Payer: Self-pay | Admitting: Cardiovascular Disease

## 2019-12-26 ENCOUNTER — Other Ambulatory Visit: Payer: Self-pay

## 2019-12-26 VITALS — BP 120/70 | HR 63 | Ht 62.0 in | Wt 221.0 lb

## 2019-12-26 DIAGNOSIS — I5032 Chronic diastolic (congestive) heart failure: Secondary | ICD-10-CM

## 2019-12-26 DIAGNOSIS — I350 Nonrheumatic aortic (valve) stenosis: Secondary | ICD-10-CM | POA: Diagnosis not present

## 2019-12-26 DIAGNOSIS — I2510 Atherosclerotic heart disease of native coronary artery without angina pectoris: Secondary | ICD-10-CM

## 2019-12-26 DIAGNOSIS — I10 Essential (primary) hypertension: Secondary | ICD-10-CM

## 2019-12-26 DIAGNOSIS — I340 Nonrheumatic mitral (valve) insufficiency: Secondary | ICD-10-CM

## 2019-12-26 DIAGNOSIS — M79605 Pain in left leg: Secondary | ICD-10-CM

## 2019-12-26 DIAGNOSIS — M791 Myalgia, unspecified site: Secondary | ICD-10-CM

## 2019-12-26 NOTE — Patient Instructions (Addendum)
Medication Instructions:  Your physician has recommended you make the following change in your medication:  1.) stop metoprolol  *If you need a refill on your cardiac medications before your next appointment, please call your pharmacy*   Lab Work: none If you have labs (blood work) drawn today and your tests are completely normal, you will receive your results only by: Marland Kitchen MyChart Message (if you have MyChart) OR . A paper copy in the mail If you have any lab test that is abnormal or we need to change your treatment, we will call you to review the results.   Testing/Procedures: ECHO DUE IN MAY, 2021 Your physician has requested that you have an echocardiogram. Echocardiography is a painless test that uses sound waves to create images of your heart. It provides your doctor with information about the size and shape of your heart and how well your heart's chambers and valves are working. This procedure takes approximately one hour. There are no restrictions for this procedure.  Your physician has requested that you have a lower extremity arterial duplex. This test is an ultrasound of the arteries in the legs. It looks at arterial blood flow in the legs. Allow one hour for Lower Arterial scans. There are no restrictions or special instructions.  Follow-Up: At [facility], you and your health needs are our priority.  As part of our continuing mission to provide you with exceptional heart care, we have created designated Provider Care Teams.  These Care Teams include your primary Cardiologist (physician) and Advanced Practice Providers (APPs -  Physician Assistants and Nurse Practitioners) who all work together to provide you with the care you need, when you need it.  Your next appointment:   6 month(s)  The format for your next appointment:   Either In Person or Virtual  Provider:   You may see Verne Carrow, MD or one of the following Advanced Practice Providers on your designated  Care Team:    Ronie Spies, PA-C  Jacolyn Reedy, PA-C    Other Instructions

## 2019-12-26 NOTE — Progress Notes (Signed)
Chief Complaint  Patient presents with  . Follow-up    CAD   History of Present Illness: 74 yo female with history of CAD s/p 1V CABG in 2010, HTN, HLD, GERD and mitral regurgitation here today for cardiac follow up. She was admitted 06/2012 with chest pain and ruled out for MI. Cardiac cath October 2013 with LAD 70-80%, mid RCA 30%, LIMA-LAD patent. She was seen in September 2015 and she c/o dyspnea with ambulation. Lasix was added and at f/u visit here in October 2015 she felt much better. She does not tolerate statins and has not tolerated HCTZ or Norvasc in the past. She stopped Zetia due to cost. She has not wished to consider a PCSK9 inhibitor. Echo May 2020 with LVEF=60-65%. Mild aortic stenosis (mean gradient 16 mmHg). Mild mitral regurgitation. She has been diagnosed with hyperthyroidism and has undergone ablation. Dr. Loanne Drilling is following this.   She is here today for follow up. The patient denies any chest pain, palpitations, lower extremity edema, orthopnea, PND. She describes weakness, dizziness and overall fatigue with recent thyroid issues. She thinks her metoprolol is contributing. Also with left leg pain and her left leg feels cool at times.     Primary Care Physician: Jonathon Jordan, MD  Past Medical History:  Diagnosis Date  . Asthma   . Chronic diastolic CHF (congestive heart failure) ([place])    a. 02/2011 Echo: EF 55-60%, Gr2 DD, Mild MR  . Chronic kidney disease   . Coronary artery disease    a. s/p CABG x 1 2010:  LIMA->LAD.;  b. amdx for CP => LHC 07/04/12: LAD 70-80%, mid RCA 30%, LIMA-LAD patent with competitive flow limiting distal LAD filling, EF 70% with hyperdynamic LV function. Medical therapy continued.  . Dyslipidemia   . GERD (gastroesophageal reflux disease)   . Hyperlipidemia   . Hypertension   . Hyperthyroidism   . Mitral regurgitation    a. mild by echo 02/2011.    Past Surgical History:  Procedure Laterality Date  . ABDOMINAL HYSTERECTOMY  1980   . CARDIAC CATHETERIZATION  07/27/09 & 07/28/09  . CESAREAN SECTION    . CESAREAN SECTION    . CORONARY ARTERY BYPASS GRAFT  08/03/2009   x1 using left internal mammary artery to distal left anterior  descending coronary artery.   Marland Kitchen GALLBLADDER SURGERY  2001  . LEFT HEART CATHETERIZATION WITH CORONARY/GRAFT ANGIOGRAM N/A 07/05/2012   Procedure: LEFT HEART CATHETERIZATION WITH Beatrix Fetters;  Surgeon: Josue Hector, MD;  Location: [facility] CATH LAB;  Service: Cardiovascular;  Laterality: N/A;  . [place] / 2001   from accident  . VESICOVAGINAL FISTULA CLOSURE W/ TAH  1998    Current Outpatient Medications  Medication Sig Dispense Refill  . aspirin 81 MG tablet Take 81 mg by mouth at bedtime.     . Cholecalciferol (VITAMIN D) 50 MCG (2000 UT) CAPS Take 1 capsule by mouth 2 (two) times daily.    . furosemide (LASIX) 20 MG tablet Take 20 mg by mouth. Pt alternates with 40 mg    . furosemide (LASIX) 40 MG tablet Take 40 mg by mouth.    . Investigational - Study Medication Take 180 mg by mouth See admin instructions. Unnamed study/Medication Name/alternative to statins: Bempedoic Acid 180 mg vs placebo, study drug provided: Take 180 mg by mouth once a day    . levothyroxine (SYNTHROID) 50 MCG tablet Take 1 tablet (50 mcg total) by mouth daily before breakfast. 90  tablet 1  . levothyroxine (SYNTHROID) 75 MCG tablet Take 37.5 mcg by mouth daily before breakfast.    . lisinopril (ZESTRIL) 40 MG tablet Take 1 tablet (40 mg total) by mouth daily. Please keep upcoming appt in January before anymore refills. Thank you 90 tablet 0  . loratadine (CLARITIN) 10 MG tablet Take 10 mg by mouth daily as needed.     . Multiple Vitamin (MULTIVITAMIN WITH MINERALS) TABS tablet Take 1 tablet by mouth daily.    . Multiple Vitamins-Minerals (OCUVITE ADULT 50+ PO) Take 1 capsule by mouth 2 (two) times daily.      No current facility-administered medications for this visit.    Allergies   Allergen Reactions  . Ceclor [Cefaclor] Other (See Comments)    Lost vision in eye  . Hctz [Hydrochlorothiazide] Other (See Comments)    Renal insufficiency  . Lipitor [Atorvastatin Calcium] Other (See Comments)    Increased A1C  . Norvasc [Amlodipine] Other (See Comments)    Makes patient stiff  . Penicillins Hives, Shortness Of Breath, Itching and Rash    Has patient had a PCN reaction causing immediate rash, facial/tongue/throat swelling, SOB or lightheadedness with hypotension: Yes Has patient had a PCN reaction causing severe rash involving mucus membranes or skin necrosis: Unk Has patient had a PCN reaction that required hospitalization: No Has patient had a PCN reaction occurring within the last 10 years: No If all of the above answers are "NO", then may proceed with Cephalosporin use.   . Sulfa Antibiotics Other (See Comments)    CAUSES SHOCK  . Statins Other (See Comments)    MYALGIAS AND WEAKNESS  . Erythromycin Rash  . Latex Rash  . Levofloxacin Rash  . Tetracyclines & Related Rash    Social History   Socioeconomic History  . Marital status: Widowed    Spouse name: Not on file  . Number of children: 3  . Years of education: Not on file  . Highest education level: Not on file  Occupational History  . Occupation: Retired- Engineer, site  Tobacco Use  . Smoking status: Former Smoker    Years: 7.00    Types: Cigarettes    Quit date: 07/12/2010    Years since quitting: 9.4  . Smokeless tobacco: Never Used  Substance and Sexual Activity  . Alcohol use: No  . Drug use: No  . Sexual activity: Not on file  Other Topics Concern  . Not on file  Social History Narrative  . Not on file   Social Determinants of Health   Financial Resource Strain:   . Difficulty of Paying Living Expenses:   Food Insecurity:   . Worried About Programme researcher, broadcasting/film/video in the Last Year:   . Barista in the Last Year:   Transportation Needs:   . Freight forwarder (Medical):    Marland Kitchen Lack of Transportation (Non-Medical):   Physical Activity:   . Days of Exercise per Week:   . Minutes of Exercise per Session:   Stress:   . Feeling of Stress :   Social Connections:   . Frequency of Communication with Friends and Family:   . Frequency of Social Gatherings with Friends and Family:   . Attends Religious Services:   . Active Member of Clubs or Organizations:   . Attends Banker Meetings:   Marland Kitchen Marital Status:   Intimate Partner Violence:   . Fear of Current or Ex-Partner:   . Emotionally Abused:   .  Physically Abused:   . Sexually Abused:     Family History  Problem Relation Age of Onset  . Cancer Father   . Heart attack Father   . High blood pressure Father   . High Cholesterol Father   . Heart disease Father   . Alcoholism Father   . Obesity Father   . Heart attack Mother   . Heart disease Mother        had pacemaker  . High blood pressure Mother   . High Cholesterol Mother   . Obesity Mother   . Cancer Brother   . Heart disease Brother   . Heart disease Sister   . Cancer Sister   . Angina Sister   . Coronary artery disease Son   . Hypertension Sister   . Hypertension Brother   . Thyroid disease Neg Hx     Review of Systems:  As stated in the HPI and otherwise negative.   BP 120/70   Pulse 63   Ht 5\' 2"  (1.575 m)   Wt 221 lb (100.2 kg)   SpO2 98%   BMI 40.42 kg/m   Physical Examination: General: Well developed, well nourished, NAD  HEENT: OP clear, mucus membranes moist  SKIN: warm, dry. No rashes. Neuro: No focal deficits  Musculoskeletal: Muscle strength 5/5 all ext  Psychiatric: Mood and affect normal  Neck: No JVD, no carotid bruits, no thyromegaly, no lymphadenopathy.  Lungs:Clear bilaterally, no wheezes, rhonci, crackles Cardiovascular: Regular rate and rhythm. Systolic murmur.  Abdomen:Soft. Bowel sounds present. Non-tender.  Extremities: No lower extremity edema. Pulses are 2 + in the bilateral DP/PT.  Echo  May 2020:  1. The left ventricle has normal systolic function with an ejection  fraction of 60-65%. The cavity size was normal. Left ventricular diastolic  Doppler parameters are consistent with pseudonormalization. Elevated mean  left atrial pressure No evidence of  left ventricular regional wall motion abnormalities.  2. The right ventricle has normal systolic function. The cavity was  normal. There is no increase in right ventricular wall thickness. Right  ventricular systolic pressure is mildly elevated with an estimated  pressure of 36.8 mmHg.  3. Left atrial size was mildly dilated.  4. There is mild mitral annular calcification present. The MR jet is  centrally-directed.  5. The aortic valve is tricuspid. Moderate thickening of the aortic  valve. Mild calcification of the aortic valve. Mild-moderate stenosis of  the aortic valve.   EKG:  EKG is ordered today. The ekg ordered today demonstrates Sinus rhythm, RBBB  Recent Labs: 10/16/2019: BUN 23; Creatinine, Ser 1.28; Potassium 4.5; Sodium 141 11/18/2019: TSH 0.27   Lipid Panel    Component Value Date/Time   CHOL 141 05/06/2018 1203   TRIG 69 05/06/2018 1203   HDL 50 05/06/2018 1203   CHOLHDL 3.1 08/07/2017 1003   CHOLHDL 3.3 06/28/2016 0930   VLDL 15 06/28/2016 0930   LDLCALC 77 05/06/2018 1203     Wt Readings from Last 3 Encounters:  12/26/19 221 lb (100.2 kg)  04/09/19 211 lb 12.8 oz (96.1 kg)  04/08/19 212 lb 6.4 oz (96.3 kg)     Other studies Reviewed: Additional studies/ records that were reviewed today include: . Review of the above records demonstrates:   Assessment and Plan:   1. CAD s/p CABG without angina: No chest pain. She has had 1V CABG with LIMA to LAD. Last cath in 2013 with patent LIMA to LAD, minimal disease RCA, no disease Circumflex. She is  statin intolerant. Recent concerns about beta blocker and her thyroid disease. Will stop her beta blocker. Continue ASA.    2. HTN: BP is well  controlled.   3. Hyperlipidemia: She refuses to take statins due to muscle aches. She stopped Zetia due to cost. We discussed PCSK9 inh but she does not wish to consider at this time.  She also discussed this in the lipid clinic again in November 2018. She is in the CLEAR trial.   4. Mitral regurgitation: Mild by echo 2020  5. Chronic diastolic CHF: No evidence of volume overload. Continue Lasix.   6. Aortic stenosis: Mild echo by echo in 2020. Repeat echo May 2021 with recent dizziness and weakness.   7. Leg pain: Will arrange LE arterial dopplers  Current medicines are reviewed at length with the patient today.  The patient does not have concerns regarding medicines.  The following changes have been made:  no change  Labs/ tests ordered today include:   Orders Placed This Encounter  Procedures  . ECHOCARDIOGRAM COMPLETE  . VAS [place] LOWER EXTREMITY ARTERIAL DUPLEX   Disposition:   FU with me in 6 months  Signed, Verne Carrow, MD 12/26/2019 11:40 AM    [facility] [address] [place], [place], [place]  [address] Phone: [phone]; Fax: [phone]

## 2019-12-29 NOTE — Addendum Note (Signed)
Addended by: Vernard Gambles on: 12/29/2019 08:16 AM   Modules accepted: Orders

## 2020-01-07 ENCOUNTER — Other Ambulatory Visit: Payer: Self-pay | Admitting: Cardiovascular Disease

## 2020-01-07 DIAGNOSIS — I10 Essential (primary) hypertension: Secondary | ICD-10-CM

## 2020-01-08 MED ORDER — LISINOPRIL 40 MG PO TABS: 40.0000 mg | ORAL_TABLET | Freq: Every day | ORAL | 3 refills | Status: DC

## 2020-01-08 NOTE — Addendum Note (Signed)
Addended by: Demetrios Loll on: 01/08/2020 12:56 PM   Modules accepted: Orders

## 2020-01-08 NOTE — Telephone Encounter (Signed)
Called [facility] to give an verbal order over the phone for medication Lisinopril 40 mg tablets dispensing 90 tablets with 3 refills, because the refill would not go through electronically. Pharmacist verbalized understanding.

## 2020-01-13 ENCOUNTER — Other Ambulatory Visit: Payer: Self-pay | Admitting: Cardiovascular Disease

## 2020-01-13 DIAGNOSIS — M79605 Pain in left leg: Secondary | ICD-10-CM

## 2020-01-13 DIAGNOSIS — R209 Unspecified disturbances of skin sensation: Secondary | ICD-10-CM

## 2020-01-13 DIAGNOSIS — I10 Essential (primary) hypertension: Secondary | ICD-10-CM

## 2020-01-13 DIAGNOSIS — I739 Peripheral vascular disease, unspecified: Secondary | ICD-10-CM

## 2020-01-13 DIAGNOSIS — I5032 Chronic diastolic (congestive) heart failure: Secondary | ICD-10-CM

## 2020-01-13 DIAGNOSIS — I2510 Atherosclerotic heart disease of native coronary artery without angina pectoris: Secondary | ICD-10-CM

## 2020-01-14 ENCOUNTER — Ambulatory Visit (HOSPITAL_COMMUNITY)
Admission: RE | Admit: 2020-01-14 | Discharge: 2020-01-14 | Disposition: A | Discharge status: Home / Self Care | Payer: Medicare HMO | Source: Ambulatory Visit | Attending: Cardiovascular Disease | Admitting: Cardiovascular Disease

## 2020-01-14 ENCOUNTER — Other Ambulatory Visit: Payer: Self-pay

## 2020-01-14 DIAGNOSIS — M79605 Pain in left leg: Secondary | ICD-10-CM | POA: Insufficient documentation

## 2020-01-14 DIAGNOSIS — I350 Nonrheumatic aortic (valve) stenosis: Secondary | ICD-10-CM

## 2020-01-14 DIAGNOSIS — I340 Nonrheumatic mitral (valve) insufficiency: Secondary | ICD-10-CM | POA: Insufficient documentation

## 2020-01-14 DIAGNOSIS — I2510 Atherosclerotic heart disease of native coronary artery without angina pectoris: Secondary | ICD-10-CM | POA: Diagnosis not present

## 2020-01-14 DIAGNOSIS — I5032 Chronic diastolic (congestive) heart failure: Secondary | ICD-10-CM | POA: Diagnosis not present

## 2020-01-14 DIAGNOSIS — R209 Unspecified disturbances of skin sensation: Secondary | ICD-10-CM

## 2020-01-14 DIAGNOSIS — I10 Essential (primary) hypertension: Secondary | ICD-10-CM | POA: Diagnosis not present

## 2020-01-14 DIAGNOSIS — I739 Peripheral vascular disease, unspecified: Secondary | ICD-10-CM | POA: Diagnosis not present

## 2020-01-16 ENCOUNTER — Telehealth: Payer: Self-pay | Admitting: *Deleted

## 2020-01-16 NOTE — Telephone Encounter (Signed)
Called patient for T11,M27 visit. Updated her new medication changes. No AE's or SAE's to submit. Next appointment already made for end of July.

## 2020-01-21 ENCOUNTER — Other Ambulatory Visit: Payer: Self-pay

## 2020-01-21 ENCOUNTER — Ambulatory Visit (HOSPITAL_COMMUNITY): Payer: Medicare HMO | Attending: Internal Medicine

## 2020-01-21 DIAGNOSIS — I340 Nonrheumatic mitral (valve) insufficiency: Secondary | ICD-10-CM | POA: Diagnosis not present

## 2020-01-21 DIAGNOSIS — I350 Nonrheumatic aortic (valve) stenosis: Secondary | ICD-10-CM

## 2020-01-21 DIAGNOSIS — M79605 Pain in left leg: Secondary | ICD-10-CM | POA: Diagnosis not present

## 2020-01-21 DIAGNOSIS — I5032 Chronic diastolic (congestive) heart failure: Secondary | ICD-10-CM | POA: Diagnosis not present

## 2020-01-21 DIAGNOSIS — I10 Essential (primary) hypertension: Secondary | ICD-10-CM | POA: Insufficient documentation

## 2020-01-21 DIAGNOSIS — I2510 Atherosclerotic heart disease of native coronary artery without angina pectoris: Secondary | ICD-10-CM | POA: Diagnosis not present

## 2020-01-22 ENCOUNTER — Other Ambulatory Visit: Payer: Self-pay

## 2020-01-23 ENCOUNTER — Ambulatory Visit: Payer: Medicare HMO | Admitting: Endocrinology

## 2020-01-23 ENCOUNTER — Encounter: Payer: Self-pay | Admitting: Endocrinology

## 2020-01-23 DIAGNOSIS — E890 Postprocedural hypothyroidism: Secondary | ICD-10-CM | POA: Diagnosis not present

## 2020-01-23 DIAGNOSIS — E039 Hypothyroidism, unspecified: Secondary | ICD-10-CM | POA: Insufficient documentation

## 2020-01-23 LAB — TSH: TSH: 4.86 u[IU]/mL — ABNORMAL HIGH (ref 0.35–4.50)

## 2020-01-23 LAB — T4, FREE: Free T4: 1.2 ng/dL (ref 0.60–1.60)

## 2020-01-23 NOTE — Progress Notes (Signed)
Subjective:    Patient ID: Samantha Short, female    DOB: [birth year], 74 y.o.   MRN: [id number]  HPI Pt returns for f/u of post-RAI hypothyroidism (dx'ed 2019; CT (2008) showed small thyroid cysts; nuc med scan was c/w multinodular goiter; she had RAI 4/19; she became euthyroid a few mos later; she became hypothyroid in early 2021, and was started on synthroid).  she stopped synthroid 2 weeks ago, due to elev BP, discoloration/swelling of the extremities, and wheezing.  Since the discontinuation, sxs are slightly improved.   Past Medical History:  Diagnosis Date  . Asthma   . Chronic diastolic CHF (congestive heart failure) ([place])    a. 02/2011 Echo: EF 55-60%, Gr2 DD, Mild MR  . Chronic kidney disease   . Coronary artery disease    a. s/p CABG x 1 2010:  LIMA->LAD.;  b. amdx for CP => LHC 07/04/12: LAD 70-80%, mid RCA 30%, LIMA-LAD patent with competitive flow limiting distal LAD filling, EF 70% with hyperdynamic LV function. Medical therapy continued.  . Dyslipidemia   . GERD (gastroesophageal reflux disease)   . Hyperlipidemia   . Hypertension   . Hyperthyroidism   . Mitral regurgitation    a. mild by echo 02/2011.    Past Surgical History:  Procedure Laterality Date  . ABDOMINAL HYSTERECTOMY  1980  . CARDIAC CATHETERIZATION  07/27/09 & 07/28/09  . CESAREAN SECTION    . CESAREAN SECTION    . CORONARY ARTERY BYPASS GRAFT  08/03/2009   x1 using left internal mammary artery to distal left anterior  descending coronary artery.   Marland Kitchen GALLBLADDER SURGERY  2001  . LEFT HEART CATHETERIZATION WITH CORONARY/GRAFT ANGIOGRAM N/A 07/05/2012   Procedure: LEFT HEART CATHETERIZATION WITH Beatrix Fetters;  Surgeon: Josue Hector, MD;  Location: [facility] CATH LAB;  Service: Cardiovascular;  Laterality: N/A;  . [place] / 2001   from accident  . VESICOVAGINAL FISTULA CLOSURE W/ TAH  1998    Social History   Socioeconomic History  . Marital status: Widowed    Spouse name:  Not on file  . Number of children: 3  . Years of education: Not on file  . Highest education level: Not on file  Occupational History  . Occupation: Retired- Education officer, museum  Tobacco Use  . Smoking status: Former Smoker    Years: 7.00    Types: Cigarettes    Quit date: 07/12/2010    Years since quitting: 9.5  . Smokeless tobacco: Never Used  Substance and Sexual Activity  . Alcohol use: No  . Drug use: No  . Sexual activity: Not on file  Other Topics Concern  . Not on file  Social History Narrative  . Not on file   Social Determinants of Health   Financial Resource Strain:   . Difficulty of Paying Living Expenses:   Food Insecurity:   . Worried About Charity fundraiser in the Last Year:   . Arboriculturist in the Last Year:   Transportation Needs:   . Film/video editor (Medical):   Marland Kitchen Lack of Transportation (Non-Medical):   Physical Activity:   . Days of Exercise per Week:   . Minutes of Exercise per Session:   Stress:   . Feeling of Stress :   Social Connections:   . Frequency of Communication with Friends and Family:   . Frequency of Social Gatherings with Friends and Family:   . Attends Religious Services:   . Active  Member of Clubs or Organizations:   . Attends Banker Meetings:   Marland Kitchen Marital Status:   Intimate Partner Violence:   . Fear of Current or Ex-Partner:   . Emotionally Abused:   Marland Kitchen Physically Abused:   . Sexually Abused:     Current Outpatient Medications on File Prior to Visit  Medication Sig Dispense Refill  . aspirin 81 MG tablet Take 81 mg by mouth at bedtime.     . Cholecalciferol (VITAMIN D) 50 MCG (2000 UT) CAPS Take 1 capsule by mouth 2 (two) times daily.    . furosemide (LASIX) 20 MG tablet Take 20 mg by mouth. Pt alternates with 40 mg    . furosemide (LASIX) 40 MG tablet Take 40 mg by mouth.    . Investigational - Study Medication Take 180 mg by mouth See admin instructions. Unnamed study/Medication Name/alternative to  statins: Bempedoic Acid 180 mg vs placebo, study drug provided: Take 180 mg by mouth once a day    . lisinopril (ZESTRIL) 40 MG tablet Take 1 tablet (40 mg total) by mouth daily. 90 tablet 3  . loratadine (CLARITIN) 10 MG tablet Take 10 mg by mouth daily as needed.     . Multiple Vitamin (MULTIVITAMIN WITH MINERALS) TABS tablet Take 1 tablet by mouth daily.    . Multiple Vitamins-Minerals (OCUVITE ADULT 50+ PO) Take 1 capsule by mouth 2 (two) times daily.      No current facility-administered medications on file prior to visit.    Allergies  Allergen Reactions  . Ceclor [Cefaclor] Other (See Comments)    Lost vision in eye  . Hctz [Hydrochlorothiazide] Other (See Comments)    Renal insufficiency  . Lipitor [Atorvastatin Calcium] Other (See Comments)    Increased A1C  . Norvasc [Amlodipine] Other (See Comments)    Makes patient stiff  . Penicillins Hives, Shortness Of Breath, Itching and Rash    Has patient had a PCN reaction causing immediate rash, facial/tongue/throat swelling, SOB or lightheadedness with hypotension: Yes Has patient had a PCN reaction causing severe rash involving mucus membranes or skin necrosis: Unk Has patient had a PCN reaction that required hospitalization: No Has patient had a PCN reaction occurring within the last 10 years: No If all of the above answers are "NO", then may proceed with Cephalosporin use.   . Sulfa Antibiotics Other (See Comments)    CAUSES SHOCK  . Statins Other (See Comments)    MYALGIAS AND WEAKNESS  . Erythromycin Rash  . Latex Rash  . Levofloxacin Rash  . Tetracyclines & Related Rash    Family History  Problem Relation Age of Onset  . Cancer Father   . Heart attack Father   . High blood pressure Father   . High Cholesterol Father   . Heart disease Father   . Alcoholism Father   . Obesity Father   . Heart attack Mother   . Heart disease Mother        had pacemaker  . High blood pressure Mother   . High Cholesterol Mother    . Obesity Mother   . Cancer Brother   . Heart disease Brother   . Heart disease Sister   . Cancer Sister   . Angina Sister   . Coronary artery disease Son   . Hypertension Sister   . Hypertension Brother   . Thyroid disease Neg Hx     BP (!) 148/92   Pulse (!) 101   Ht 5\' 2"  (1.575  m)   Wt 221 lb (100.2 kg)   SpO2 99%   BMI 40.42 kg/m    Review of Systems She has weight gain    Objective:   Physical Exam VITAL SIGNS:  See vs page GENERAL: no distress NECK: There is no palpable thyroid enlargement.  No thyroid nodule is palpable.  No palpable lymphadenopathy at the anterior neck.  outside test results are reviewed: TSH (on synthroid)=3.4  Lab Results  Component Value Date   TSH 4.86 (H) 01/23/2020       Assessment & Plan:  Hypothyroidism: now off rx elev HR and BP, and sxs, new to me.  I discussed the fact that these are not related to synthroid.   Patient Instructions  Blood tests are requested for you today.  We'll let you know about the results.

## 2020-01-23 NOTE — Patient Instructions (Addendum)
Blood tests are requested for you today.  We'll let you know about the results.  

## 2020-01-26 ENCOUNTER — Other Ambulatory Visit: Payer: Self-pay | Admitting: Endocrinology

## 2020-01-26 DIAGNOSIS — E890 Postprocedural hypothyroidism: Secondary | ICD-10-CM

## 2020-01-26 MED ORDER — LEVOTHYROXINE SODIUM 25 MCG PO TABS: 25.0000 ug | ORAL_TABLET | Freq: Every day | ORAL | 3 refills | Status: DC

## 2020-01-29 ENCOUNTER — Telehealth: Payer: Self-pay | Admitting: *Deleted

## 2020-01-29 NOTE — Telephone Encounter (Signed)
Patient has been informed of echo review/results. Reports she is monitoring BP at home and it has been running in 140s/ high 80s. Her endocrine doctor put her back on low dose of thyroid medication and the pt thinks this will help her BP. I adv that BP is not necessarily related to thyroid so she she should continue to monitor BP at home and if she consistently gets readings that are 140/90 that she should call back to let Dr. Clifton James know this.

## 2020-01-29 NOTE — Telephone Encounter (Signed)
-----   Message from Kathleene Hazel, MD sent at 01/22/2020 10:55 AM EDT ----- Her heart remains strong. Her aortic valve stenosis is unchanged in a mild to moderate range. Thayer Ohm

## 2020-02-04 ENCOUNTER — Other Ambulatory Visit: Payer: Self-pay

## 2020-02-04 ENCOUNTER — Encounter (INDEPENDENT_AMBULATORY_CARE_PROVIDER_SITE_OTHER): Payer: Medicare HMO | Admitting: Ophthalmology

## 2020-02-04 DIAGNOSIS — H353132 Nonexudative age-related macular degeneration, bilateral, intermediate dry stage: Secondary | ICD-10-CM

## 2020-02-04 DIAGNOSIS — I10 Essential (primary) hypertension: Secondary | ICD-10-CM | POA: Diagnosis not present

## 2020-02-04 DIAGNOSIS — H35033 Hypertensive retinopathy, bilateral: Secondary | ICD-10-CM | POA: Diagnosis not present

## 2020-02-04 DIAGNOSIS — H43813 Vitreous degeneration, bilateral: Secondary | ICD-10-CM | POA: Diagnosis not present

## 2020-03-08 ENCOUNTER — Other Ambulatory Visit: Payer: Self-pay

## 2020-03-09 ENCOUNTER — Other Ambulatory Visit (INDEPENDENT_AMBULATORY_CARE_PROVIDER_SITE_OTHER): Payer: Medicare HMO

## 2020-03-09 DIAGNOSIS — E890 Postprocedural hypothyroidism: Secondary | ICD-10-CM | POA: Diagnosis not present

## 2020-03-09 LAB — TSH: TSH: 5.45 u[IU]/mL — ABNORMAL HIGH (ref 0.35–4.50)

## 2020-03-09 LAB — T4, FREE: Free T4: 1.07 ng/dL (ref 0.60–1.60)

## 2020-03-11 DIAGNOSIS — Z882 Allergy status to sulfonamides status: Secondary | ICD-10-CM | POA: Diagnosis not present

## 2020-03-11 DIAGNOSIS — I10 Essential (primary) hypertension: Secondary | ICD-10-CM | POA: Diagnosis not present

## 2020-03-11 DIAGNOSIS — Z880 Allergy status to penicillin: Secondary | ICD-10-CM | POA: Diagnosis not present

## 2020-03-11 DIAGNOSIS — Z888 Allergy status to other drugs, medicaments and biological substances status: Secondary | ICD-10-CM | POA: Diagnosis not present

## 2020-03-11 DIAGNOSIS — R0602 Shortness of breath: Secondary | ICD-10-CM | POA: Diagnosis not present

## 2020-03-11 DIAGNOSIS — Z951 Presence of aortocoronary bypass graft: Secondary | ICD-10-CM | POA: Diagnosis not present

## 2020-03-11 DIAGNOSIS — Z87891 Personal history of nicotine dependence: Secondary | ICD-10-CM | POA: Diagnosis not present

## 2020-03-11 DIAGNOSIS — Z91040 Latex allergy status: Secondary | ICD-10-CM | POA: Diagnosis not present

## 2020-03-11 DIAGNOSIS — Z881 Allergy status to other antibiotic agents status: Secondary | ICD-10-CM | POA: Diagnosis not present

## 2020-03-11 DIAGNOSIS — J45909 Unspecified asthma, uncomplicated: Secondary | ICD-10-CM | POA: Diagnosis not present

## 2020-03-11 DIAGNOSIS — J45901 Unspecified asthma with (acute) exacerbation: Secondary | ICD-10-CM | POA: Diagnosis not present

## 2020-03-14 ENCOUNTER — Emergency Department (HOSPITAL_COMMUNITY): Payer: Medicare HMO

## 2020-03-14 ENCOUNTER — Inpatient Hospital Stay (HOSPITAL_COMMUNITY)
Admission: EM | Admit: 2020-03-14 | Discharge: 2020-03-18 | DRG: 242 | Disposition: A | Discharge status: Home / Self Care | Payer: Medicare HMO | Attending: Internal Medicine | Admitting: Internal Medicine

## 2020-03-14 ENCOUNTER — Other Ambulatory Visit: Payer: Self-pay

## 2020-03-14 ENCOUNTER — Encounter (HOSPITAL_COMMUNITY): Payer: Self-pay | Admitting: Emergency Medicine

## 2020-03-14 DIAGNOSIS — T82847A Pain from cardiac prosthetic devices, implants and grafts, initial encounter: Secondary | ICD-10-CM | POA: Diagnosis not present

## 2020-03-14 DIAGNOSIS — Z881 Allergy status to other antibiotic agents status: Secondary | ICD-10-CM

## 2020-03-14 DIAGNOSIS — I509 Heart failure, unspecified: Secondary | ICD-10-CM

## 2020-03-14 DIAGNOSIS — J9601 Acute respiratory failure with hypoxia: Secondary | ICD-10-CM | POA: Diagnosis present

## 2020-03-14 DIAGNOSIS — Z20822 Contact with and (suspected) exposure to covid-19: Secondary | ICD-10-CM | POA: Diagnosis present

## 2020-03-14 DIAGNOSIS — R0602 Shortness of breath: Secondary | ICD-10-CM | POA: Diagnosis not present

## 2020-03-14 DIAGNOSIS — Z888 Allergy status to other drugs, medicaments and biological substances status: Secondary | ICD-10-CM

## 2020-03-14 DIAGNOSIS — I459 Conduction disorder, unspecified: Secondary | ICD-10-CM | POA: Diagnosis not present

## 2020-03-14 DIAGNOSIS — I441 Atrioventricular block, second degree: Secondary | ICD-10-CM

## 2020-03-14 DIAGNOSIS — N1831 Chronic kidney disease, stage 3a: Secondary | ICD-10-CM | POA: Diagnosis not present

## 2020-03-14 DIAGNOSIS — R079 Chest pain, unspecified: Secondary | ICD-10-CM | POA: Diagnosis not present

## 2020-03-14 DIAGNOSIS — Z87891 Personal history of nicotine dependence: Secondary | ICD-10-CM

## 2020-03-14 DIAGNOSIS — Z79890 Hormone replacement therapy (postmenopausal): Secondary | ICD-10-CM

## 2020-03-14 DIAGNOSIS — E890 Postprocedural hypothyroidism: Secondary | ICD-10-CM | POA: Diagnosis not present

## 2020-03-14 DIAGNOSIS — Z83438 Family history of other disorder of lipoprotein metabolism and other lipidemia: Secondary | ICD-10-CM

## 2020-03-14 DIAGNOSIS — I350 Nonrheumatic aortic (valve) stenosis: Secondary | ICD-10-CM | POA: Diagnosis present

## 2020-03-14 DIAGNOSIS — I25110 Atherosclerotic heart disease of native coronary artery with unstable angina pectoris: Secondary | ICD-10-CM | POA: Diagnosis not present

## 2020-03-14 DIAGNOSIS — E039 Hypothyroidism, unspecified: Secondary | ICD-10-CM | POA: Diagnosis present

## 2020-03-14 DIAGNOSIS — Z8249 Family history of ischemic heart disease and other diseases of the circulatory system: Secondary | ICD-10-CM

## 2020-03-14 DIAGNOSIS — N179 Acute kidney failure, unspecified: Secondary | ICD-10-CM | POA: Diagnosis not present

## 2020-03-14 DIAGNOSIS — Z91040 Latex allergy status: Secondary | ICD-10-CM

## 2020-03-14 DIAGNOSIS — I440 Atrioventricular block, first degree: Secondary | ICD-10-CM | POA: Diagnosis not present

## 2020-03-14 DIAGNOSIS — I499 Cardiac arrhythmia, unspecified: Secondary | ICD-10-CM | POA: Diagnosis not present

## 2020-03-14 DIAGNOSIS — K449 Diaphragmatic hernia without obstruction or gangrene: Secondary | ICD-10-CM | POA: Diagnosis present

## 2020-03-14 DIAGNOSIS — J449 Chronic obstructive pulmonary disease, unspecified: Secondary | ICD-10-CM | POA: Diagnosis present

## 2020-03-14 DIAGNOSIS — I130 Hypertensive heart and chronic kidney disease with heart failure and stage 1 through stage 4 chronic kidney disease, or unspecified chronic kidney disease: Secondary | ICD-10-CM | POA: Diagnosis not present

## 2020-03-14 DIAGNOSIS — I359 Nonrheumatic aortic valve disorder, unspecified: Secondary | ICD-10-CM

## 2020-03-14 DIAGNOSIS — Z959 Presence of cardiac and vascular implant and graft, unspecified: Secondary | ICD-10-CM

## 2020-03-14 DIAGNOSIS — I10 Essential (primary) hypertension: Secondary | ICD-10-CM | POA: Diagnosis present

## 2020-03-14 DIAGNOSIS — Z7982 Long term (current) use of aspirin: Secondary | ICD-10-CM

## 2020-03-14 DIAGNOSIS — I4510 Unspecified right bundle-branch block: Secondary | ICD-10-CM | POA: Diagnosis present

## 2020-03-14 DIAGNOSIS — Z880 Allergy status to penicillin: Secondary | ICD-10-CM

## 2020-03-14 DIAGNOSIS — I5032 Chronic diastolic (congestive) heart failure: Secondary | ICD-10-CM | POA: Diagnosis present

## 2020-03-14 DIAGNOSIS — I442 Atrioventricular block, complete: Secondary | ICD-10-CM | POA: Diagnosis not present

## 2020-03-14 DIAGNOSIS — Z79899 Other long term (current) drug therapy: Secondary | ICD-10-CM

## 2020-03-14 DIAGNOSIS — Z6841 Body Mass Index (BMI) 40.0 and over, adult: Secondary | ICD-10-CM | POA: Diagnosis not present

## 2020-03-14 DIAGNOSIS — I4430 Unspecified atrioventricular block: Secondary | ICD-10-CM | POA: Diagnosis not present

## 2020-03-14 DIAGNOSIS — E785 Hyperlipidemia, unspecified: Secondary | ICD-10-CM | POA: Diagnosis present

## 2020-03-14 DIAGNOSIS — D71 Functional disorders of polymorphonuclear neutrophils: Secondary | ICD-10-CM | POA: Diagnosis present

## 2020-03-14 DIAGNOSIS — T380X5A Adverse effect of glucocorticoids and synthetic analogues, initial encounter: Secondary | ICD-10-CM | POA: Diagnosis present

## 2020-03-14 DIAGNOSIS — Z951 Presence of aortocoronary bypass graft: Secondary | ICD-10-CM

## 2020-03-14 DIAGNOSIS — K219 Gastro-esophageal reflux disease without esophagitis: Secondary | ICD-10-CM | POA: Diagnosis not present

## 2020-03-14 DIAGNOSIS — R001 Bradycardia, unspecified: Secondary | ICD-10-CM | POA: Diagnosis present

## 2020-03-14 DIAGNOSIS — I110 Hypertensive heart disease with heart failure: Secondary | ICD-10-CM | POA: Diagnosis not present

## 2020-03-14 DIAGNOSIS — E669 Obesity, unspecified: Secondary | ICD-10-CM | POA: Diagnosis present

## 2020-03-14 DIAGNOSIS — Z882 Allergy status to sulfonamides status: Secondary | ICD-10-CM

## 2020-03-14 DIAGNOSIS — Z811 Family history of alcohol abuse and dependence: Secondary | ICD-10-CM

## 2020-03-14 LAB — COMPREHENSIVE METABOLIC PANEL
ALT: 35 U/L (ref 0–44)
AST: 44 U/L — ABNORMAL HIGH (ref 15–41)
Albumin: 3.8 g/dL (ref 3.5–5.0)
Alkaline Phosphatase: 66 U/L (ref 38–126)
Anion gap: 13 (ref 5–15)
BUN: 37 mg/dL — ABNORMAL HIGH (ref 8–23)
CO2: 18 mmol/L — ABNORMAL LOW (ref 22–32)
Calcium: 9.3 mg/dL (ref 8.9–10.3)
Chloride: 112 mmol/L — ABNORMAL HIGH (ref 98–111)
Creatinine, Ser: 1.49 mg/dL — ABNORMAL HIGH (ref 0.44–1.00)
GFR calc Af Amer: 40 mL/min — ABNORMAL LOW (ref >60)
GFR calc non Af Amer: 34 mL/min — ABNORMAL LOW (ref >60)
Glucose, Bld: 100 mg/dL — ABNORMAL HIGH (ref 70–99)
Potassium: 4.4 mmol/L (ref 3.5–5.1)
Sodium: 143 mmol/L (ref 135–145)
Total Bilirubin: 1 mg/dL (ref 0.3–1.2)
Total Protein: 6.8 g/dL (ref 6.5–8.1)

## 2020-03-14 LAB — CBC WITH DIFFERENTIAL/PLATELET
Abs Immature Granulocytes: 0.15 10*3/uL — ABNORMAL HIGH (ref 0.00–0.07)
Basophils Absolute: 0.1 10*3/uL (ref 0.0–0.1)
Basophils Relative: 0 %
Eosinophils Absolute: 0 10*3/uL (ref 0.0–0.5)
Eosinophils Relative: 0 %
HCT: 40.5 % (ref 36.0–46.0)
Hemoglobin: 12.6 g/dL (ref 12.0–15.0)
Immature Granulocytes: 1 %
Lymphocytes Relative: 10 %
Lymphs Abs: 2.1 10*3/uL (ref 0.7–4.0)
MCH: 29.9 pg (ref 26.0–34.0)
MCHC: 31.1 g/dL (ref 30.0–36.0)
MCV: 96.2 fL (ref 80.0–100.0)
Monocytes Absolute: 1.9 10*3/uL — ABNORMAL HIGH (ref 0.1–1.0)
Monocytes Relative: 9 %
Neutro Abs: 16.9 10*3/uL — ABNORMAL HIGH (ref 1.7–7.7)
Neutrophils Relative %: 80 %
Platelets: 292 10*3/uL (ref 150–400)
RBC: 4.21 MIL/uL (ref 3.87–5.11)
RDW: 13.2 % (ref 11.5–15.5)
WBC: 21.1 10*3/uL — ABNORMAL HIGH (ref 4.0–10.5)
nRBC: 0 % (ref 0.0–0.2)

## 2020-03-14 LAB — T4, FREE: Free T4: 1.12 ng/dL (ref 0.61–1.12)

## 2020-03-14 LAB — TROPONIN I (HIGH SENSITIVITY)
Troponin I (High Sensitivity): 43 ng/L — ABNORMAL HIGH (ref <18)
Troponin I (High Sensitivity): 58 ng/L — ABNORMAL HIGH (ref <18)
Troponin I (High Sensitivity): 57 ng/L — ABNORMAL HIGH (ref <18)

## 2020-03-14 LAB — SARS CORONAVIRUS 2 BY RT PCR (HOSPITAL ORDER, PERFORMED IN ~~LOC~~ HOSPITAL LAB): SARS Coronavirus 2: NEGATIVE

## 2020-03-14 LAB — TSH: TSH: 5.79 u[IU]/mL — ABNORMAL HIGH (ref 0.350–4.500)

## 2020-03-14 LAB — BRAIN NATRIURETIC PEPTIDE: B Natriuretic Peptide: 883.2 pg/mL — ABNORMAL HIGH (ref 0.0–100.0)

## 2020-03-14 MED ORDER — PRESERVISION AREDS 2 PO CAPS: 1.0000 | ORAL_CAPSULE | Freq: Two times a day (BID) | ORAL | Status: DC

## 2020-03-14 MED ORDER — ALBUTEROL SULFATE (2.5 MG/3ML) 0.083% IN NEBU: 2.5000 mg | INHALATION_SOLUTION | RESPIRATORY_TRACT | Status: DC | PRN

## 2020-03-14 MED ORDER — ONDANSETRON HCL 4 MG/2ML IJ SOLN
4.0000 mg | Freq: Four times a day (QID) | INTRAMUSCULAR | Status: DC | PRN
  Administered 2020-03-16 – 2020-03-18 (×2): 4 mg via INTRAVENOUS
  Filled 2020-03-14: qty 2

## 2020-03-14 MED ORDER — ADULT MULTIVITAMIN W/MINERALS CH
1.0000 | ORAL_TABLET | Freq: Every day | ORAL | Status: DC
  Administered 2020-03-15 – 2020-03-18 (×3): 1 via ORAL
  Filled 2020-03-14 (×3): qty 1

## 2020-03-14 MED ORDER — ONDANSETRON HCL 4 MG PO TABS
4.0000 mg | ORAL_TABLET | Freq: Four times a day (QID) | ORAL | Status: DC | PRN
  Administered 2020-03-17: 4 mg via ORAL
  Filled 2020-03-14: qty 1

## 2020-03-14 MED ORDER — IOHEXOL 350 MG/ML SOLN
50.0000 mL | Freq: Once | INTRAVENOUS | Status: AC | PRN
  Administered 2020-03-14: 50 mL via INTRAVENOUS

## 2020-03-14 MED ORDER — VITAMIN D 25 MCG (1000 UNIT) PO TABS
2000.0000 [IU] | ORAL_TABLET | Freq: Every day | ORAL | Status: DC
  Administered 2020-03-15 – 2020-03-18 (×3): 2000 [IU] via ORAL
  Filled 2020-03-14 (×8): qty 2

## 2020-03-14 MED ORDER — ASPIRIN EC 81 MG PO TBEC
81.0000 mg | DELAYED_RELEASE_TABLET | Freq: Every day | ORAL | Status: DC
  Administered 2020-03-14 – 2020-03-17 (×5): 81 mg via ORAL
  Filled 2020-03-14 (×5): qty 1

## 2020-03-14 MED ORDER — ACETAMINOPHEN 650 MG RE SUPP: 650.0000 mg | Freq: Four times a day (QID) | RECTAL | Status: DC | PRN

## 2020-03-14 MED ORDER — ACETAMINOPHEN 325 MG PO TABS: 650.0000 mg | ORAL_TABLET | Freq: Four times a day (QID) | ORAL | Status: DC | PRN

## 2020-03-14 MED ORDER — HEPARIN (PORCINE) 25000 UT/250ML-% IV SOLN
900.0000 [IU]/h | INTRAVENOUS | Status: DC
  Administered 2020-03-14 – 2020-03-16 (×3): 900 [IU]/h via INTRAVENOUS
  Filled 2020-03-14 (×3): qty 250

## 2020-03-14 MED ORDER — FUROSEMIDE 10 MG/ML IJ SOLN
40.0000 mg | Freq: Once | INTRAMUSCULAR | Status: AC
  Administered 2020-03-14: 40 mg via INTRAVENOUS
  Filled 2020-03-14: qty 4

## 2020-03-14 MED ORDER — LEVOTHYROXINE SODIUM 25 MCG PO TABS
12.5000 ug | ORAL_TABLET | Freq: Every day | ORAL | Status: DC
  Administered 2020-03-15 – 2020-03-18 (×4): 12.5 ug via ORAL
  Filled 2020-03-14 (×4): qty 1

## 2020-03-14 MED ORDER — HEPARIN BOLUS VIA INFUSION
3000.0000 [IU] | Freq: Once | INTRAVENOUS | Status: AC
  Administered 2020-03-14: 3000 [IU] via INTRAVENOUS
  Filled 2020-03-14: qty 3000

## 2020-03-14 MED ORDER — OMEPRAZOLE MAGNESIUM 20 MG PO TBEC: 20.0000 mg | DELAYED_RELEASE_TABLET | Freq: Every day | ORAL | Status: DC

## 2020-03-14 MED ORDER — PANTOPRAZOLE SODIUM 40 MG PO TBEC
40.0000 mg | DELAYED_RELEASE_TABLET | Freq: Every day | ORAL | Status: DC
  Administered 2020-03-14 – 2020-03-16 (×3): 40 mg via ORAL
  Filled 2020-03-14 (×3): qty 1

## 2020-03-14 MED ORDER — FUROSEMIDE 20 MG PO TABS
20.0000 mg | ORAL_TABLET | Freq: Every day | ORAL | Status: DC
  Administered 2020-03-15: 20 mg via ORAL
  Filled 2020-03-14: qty 1

## 2020-03-14 MED ORDER — HEPARIN SODIUM (PORCINE) 5000 UNIT/ML IJ SOLN: 5000.0000 [IU] | Freq: Three times a day (TID) | INTRAMUSCULAR | Status: DC

## 2020-03-14 NOTE — ED Triage Notes (Signed)
Pt arrives from home via EMS reporting SOB with CP, worsening over past 2 months.  Pt reports DVT rule out recently, reports abd distension.  EMS reports pt was 88% RA, 100 2L.  On arrival pt 100% RA, speaking in full sentences.

## 2020-03-14 NOTE — ED Provider Notes (Signed)
[facility] EMERGENCY DEPARTMENT Provider Note   CSN: [id number] Arrival date & time: 03/14/20  1057     History Chief Complaint  Patient presents with  . Shortness of Breath  . Chest Pain    Samantha Short is a 74 y.o. female.  HPI 74 year old female presents with shortness of breath.  This has been ongoing for about 2 weeks.  She has not had any type of cough except for this morning in the middle the night she developed a coughing fit.  Otherwise she has not had a cough.  She went to an outside hospital 3 days ago and was diagnosed with an asthma exacerbation, though she states she has not had asthma since she was about 30.  She does not have any current leg swelling but did have some color change that she described as purple to her left leg and had a DVT ultrasound that was negative a few weeks ago.  She has constant left lower chest pain for the last few months, this is about 24/7 but has not really worsened with walking.  The shortness of breath is present all the time but significant whenever she walks only about 10 feet.   Past Medical History:  Diagnosis Date  . Asthma   . Chronic diastolic CHF (congestive heart failure) (HCC)    a. 02/2011 Echo: EF 55-60%, Gr2 DD, Mild MR  . Coronary artery disease    a. s/p CABG x 1 2010:  LIMA->LAD.;  b. amdx for CP => LHC 07/04/12: LAD 70-80%, mid RCA 30%, LIMA-LAD patent with competitive flow limiting distal LAD filling, EF 70% with hyperdynamic LV function. Medical therapy continued.  . Dyslipidemia   . GERD (gastroesophageal reflux disease)   . Hyperlipidemia   . Hypertension   . Hyperthyroidism   . Mitral regurgitation    a. mild by echo 02/2011.    Patient Active Problem List   Diagnosis Date Noted  . Acute respiratory failure with hypoxia (HCC) 03/14/2020  . Hypothyroidism 01/23/2020  . Weight gain 02/08/2018  . Chronic diastolic CHF (congestive heart failure) (HCC) 07/30/2017  . Chest pain, mid sternal  07/05/2012  . Acid reflux   . Coronary artery disease   . Hyperlipidemia   . Hypertension   . Heart murmur     Past Surgical History:  Procedure Laterality Date  . ABDOMINAL HYSTERECTOMY  1980  . CARDIAC CATHETERIZATION  07/27/09 & 07/28/09  . CESAREAN SECTION    . CORONARY ARTERY BYPASS GRAFT  08/03/2009   x1 using left internal mammary artery to distal left anterior  descending coronary artery.   Marland Kitchen GALLBLADDER SURGERY  2001  . LEFT HEART CATHETERIZATION WITH CORONARY/GRAFT ANGIOGRAM N/A 07/05/2012   Procedure: LEFT HEART CATHETERIZATION WITH Isabel Caprice;  Surgeon: Wendall Stade, MD;  Location: [facility] CATH LAB;  Service: Cardiovascular;  Laterality: N/A;  . SHOULDER SURGERY  1998 / 2001   from accident  . VESICOVAGINAL FISTULA CLOSURE W/ TAH  1998     OB History    Gravida  3   Para  3   Term      Preterm      AB      Living  3     SAB      TAB      Ectopic      Multiple      Live Births              Family History  Problem Relation Age of Onset  . Cancer Father   . Heart attack Father   . High blood pressure Father   . High Cholesterol Father   . Heart disease Father   . Alcoholism Father   . Obesity Father   . Heart attack Mother   . Heart disease Mother        had pacemaker  . High blood pressure Mother   . High Cholesterol Mother   . Obesity Mother   . Cancer Brother   . Heart disease Brother   . Heart disease Sister   . Cancer Sister   . Angina Sister   . Coronary artery disease Son   . Hypertension Sister   . Hypertension Brother   . Thyroid disease Neg Hx     Social History   Tobacco Use  . Smoking status: Former Smoker    Years: 7.00    Types: Cigarettes    Quit date: 07/12/2010    Years since quitting: 9.6  . Smokeless tobacco: Never Used  Vaping Use  . Vaping Use: Never used  Substance Use Topics  . Alcohol use: No  . Drug use: No    Home Medications Prior to Admission medications   Medication Sig  Start Date End Date Taking? Authorizing Provider  acetaminophen (TYLENOL) 325 MG tablet Take 325 mg by mouth every 6 (six) hours as needed for headache (pain).    Yes [provider]  albuterol (VENTOLIN HFA) 108 (90 Base) MCG/ACT inhaler Inhale 2 puffs into the lungs every 4 (four) hours as needed for wheezing or shortness of breath (asthma).  03/12/20  Yes [provider]  aspirin EC 81 MG tablet Take 81 mg by mouth at bedtime. Swallow whole.   Yes [provider]  Cholecalciferol (VITAMIN D) 50 MCG (2000 UT) CAPS Take 2,000 Units by mouth daily.    Yes [provider]  furosemide (LASIX) 20 MG tablet Take 20 mg by mouth daily.    Yes [provider]  furosemide (LASIX) 40 MG tablet Take 40 mg by mouth daily as needed for fluid or edema (leg swelling).    Yes [provider]  Investigational - Study Medication Take 1 tablet by mouth daily. Unnamed study/Medication Name/alternative to statins: Bempedoic Acid 180 mg vs placebo Managed by Dr. Jens Som. Start date March 2019/ original term 3 years - extended indefinitely   Yes [provider]  levothyroxine (SYNTHROID) 25 MCG tablet Take 1 tablet (25 mcg total) by mouth daily before breakfast. Patient taking differently: Take 12.5 mcg by mouth daily before breakfast.  01/26/20  Yes Romero Belling, MD  lisinopril (ZESTRIL) 40 MG tablet Take 1 tablet (40 mg total) by mouth daily. Patient taking differently: Take 40 mg by mouth at bedtime.  01/08/20  Yes Kathleene Hazel, MD  Multiple Vitamin (MULTIVITAMIN WITH MINERALS) TABS tablet Take 1 tablet by mouth daily.   Yes [provider]  Multiple Vitamins-Minerals (PRESERVISION AREDS 2) CAPS Take 1 capsule by mouth 2 (two) times daily.   Yes [provider]  omeprazole (PRILOSEC OTC) 20 MG tablet Take 20 mg by mouth daily.   Yes [provider]  OVER THE COUNTER MEDICATION Take 1 tablet by mouth daily as needed  (allergies). OTC allergy medication   Yes [provider]  predniSONE (DELTASONE) 10 MG tablet Take 10-40 mg by mouth See admin instructions. Tapered course started 03/12/2020: take 4 tablets (40 mg) by mouth daily for 2 days, then take  3 tablets (30 mg) daily for 2 days, then take 2 tablets (20 mg) daily for 2 days, then take 1 tablet (10 mg) daily for 2 days 03/12/20  Yes [provider]    Allergies    Ceclor [cefaclor], Hctz [hydrochlorothiazide], Lipitor [atorvastatin calcium], Norvasc [amlodipine], Penicillins, Sulfa antibiotics, Statins, Erythromycin, Latex, Levofloxacin, and Tetracyclines & related  Review of Systems   Review of Systems  Constitutional: Negative for fever.  Respiratory: Positive for cough and shortness of breath.   Cardiovascular: Positive for chest pain. Negative for leg swelling.  Gastrointestinal: Positive for constipation and diarrhea. Negative for vomiting.  All other systems reviewed and are negative.   Physical Exam Updated Vital Signs BP (!) 128/53 (BP Location: Right Arm)   Pulse (!) 51   Temp 98.5 F (36.9 C) (Oral)   Resp 20   Ht 5\' 2"  (1.575 m)   Wt 99.9 kg   SpO2 96%   BMI 40.28 kg/m   Physical Exam Vitals and nursing note reviewed.  Constitutional:      General: She is not in acute distress.    Appearance: She is well-developed. She is obese. She is not ill-appearing or diaphoretic.  HENT:     Head: Normocephalic and atraumatic.     Right Ear: External ear normal.     Left Ear: External ear normal.     Nose: Nose normal.  Eyes:     General:        Right eye: No discharge.        Left eye: No discharge.  Cardiovascular:     Rate and Rhythm: Normal rate and regular rhythm.     Heart sounds: Murmur heard.   Pulmonary:     Effort: Pulmonary effort is normal.     Breath sounds: Normal breath sounds. No wheezing or rhonchi.  Abdominal:     Palpations: Abdomen is soft.     Tenderness: There is no abdominal tenderness.   Musculoskeletal:     Right lower leg: No edema.     Left lower leg: No edema.  Skin:    General: Skin is warm and dry.  Neurological:     Mental Status: She is alert.  Psychiatric:        Mood and Affect: Mood is not anxious.     ED Results / Procedures / Treatments   Labs (all labs ordered are listed, but only abnormal results are displayed) Labs Reviewed  COMPREHENSIVE METABOLIC PANEL - Abnormal; Notable for the following components:      Result Value   Chloride 112 (*)    CO2 18 (*)    Glucose, Bld 100 (*)    BUN 37 (*)    Creatinine, Ser 1.49 (*)    AST 44 (*)    GFR calc non Af Amer 34 (*)    GFR calc Af Amer 40 (*)    All other components within normal limits  BRAIN NATRIURETIC PEPTIDE - Abnormal; Notable for the following components:   B Natriuretic Peptide 883.2 (*)    All other components within normal limits  CBC WITH DIFFERENTIAL/PLATELET - Abnormal; Notable for the following components:   WBC 21.1 (*)    Neutro Abs 16.9 (*)    Monocytes Absolute 1.9 (*)    Abs Immature Granulocytes 0.15 (*)    All other components within normal limits  TROPONIN I (HIGH SENSITIVITY) - Abnormal; Notable for the following components:   Troponin I (High Sensitivity) 43 (*)  All other components within normal limits  TROPONIN I (HIGH SENSITIVITY) - Abnormal; Notable for the following components:   Troponin I (High Sensitivity) 58 (*)    All other components within normal limits  TROPONIN I (HIGH SENSITIVITY) - Abnormal; Notable for the following components:   Troponin I (High Sensitivity) 57 (*)    All other components within normal limits  SARS CORONAVIRUS 2 BY RT PCR (HOSPITAL ORDER, PERFORMED IN [facility])  T4, FREE  TSH  BASIC METABOLIC PANEL  BRAIN NATRIURETIC PEPTIDE  CBC  HEPARIN LEVEL (UNFRACTIONATED)    EKG EKG Interpretation  Date/Time:  Sunday March 14 2020 11:37:48 EDT Ventricular Rate:  52 PR Interval:    QRS Duration: 139 QT  Interval:  577 QTC Calculation: 537 R Axis:   72 Text Interpretation: Sinus rhythm Mobitz II 2-degree AV block Right bundle branch block Confirmed by Pricilla Loveless [id number]) on 03/14/2020 11:47:16 AM   Radiology DG Chest 2 View  Result Date: 03/14/2020 CLINICAL DATA:  Shortness of breath, chest pain EXAM: CHEST - 2 VIEW COMPARISON:  05/23/2018 FINDINGS: Status post median sternotomy. Multiple benign calcified nodules are present bilaterally. No acute appearing airspace opacity. The visualized skeletal structures are unremarkable. IMPRESSION: 1. No acute abnormality of the lungs. 2. Multiple benign calcified nodules bilaterally. Electronically Signed   By: Lauralyn Primes M.D.   On: 03/14/2020 11:41   CT Angio Chest PE W and/or Wo Contrast  Result Date: 03/14/2020 CLINICAL DATA:  Shortness of breath and chest pain worsening over the last 2 months. Abdominal distention. EXAM: CT ANGIOGRAPHY CHEST WITH CONTRAST TECHNIQUE: Multidetector CT imaging of the chest was performed using the standard protocol during bolus administration of intravenous contrast. Multiplanar CT image reconstructions and MIPs were obtained to evaluate the vascular anatomy. CONTRAST:  81mL OMNIPAQUE IOHEXOL 350 MG/ML SOLN COMPARISON:  Chest x-ray on 03/14/2020 FINDINGS: Cardiovascular: Heart is enlarged. There is focal atherosclerotic calcification of the coronary arteries. Median sternotomy and CABG. Pulmonary arteries are well opacified and there is no acute pulmonary embolus. Mediastinum/Nodes: The visualized portion of the thyroid gland has a normal appearance. Small hiatal hernia. Otherwise the esophagus is normal. There are multiple calcified mediastinal and hilar lymph nodes. No suspicious lymphadenopathy. Lungs/Pleura: There are small bilateral pleural effusions. Numerous calcified granulomata are identified throughout the lungs. No suspicious noncalcified mass is identified. There is minimal atelectasis at the lung bases, RIGHT  greater than LEFT. Upper Abdomen: Status post cholecystectomy. Calcified granulomata identified within the liver and spleen. Musculoskeletal: Median sternotomy. Remote wedge compression fracture of T5. Review of the MIP images confirms the above findings. IMPRESSION: 1. Technically adequate exam showing no acute pulmonary embolus. 2. Cardiomegaly, coronary artery disease, and prior CABG. 3. Small bilateral pleural effusions and minimal atelectasis at the lung bases, RIGHT greater than LEFT. 4. Evidence of prior granulomatous disease. 5. Small hiatal hernia. 6. Remote wedge compression fracture of T5. 7. Aortic Atherosclerosis (ICD10-I70.0). Electronically Signed   By: Norva Pavlov M.D.   On: 03/14/2020 14:10    Procedures Procedures (including critical care time)  Medications Ordered in ED Medications  aspirin EC tablet 81 mg (has no administration in time range)  furosemide (LASIX) tablet 20 mg (20 mg Oral Not Given 03/14/20 1805)  levothyroxine (SYNTHROID) tablet 12.5 mcg (has no administration in time range)  multivitamin with minerals tablet 1 tablet (1 tablet Oral Not Given 03/14/20 1803)  cholecalciferol (VITAMIN D) tablet 2,000 Units (2,000 Units Oral Not Given 03/14/20 1803)  acetaminophen (TYLENOL)  tablet 650 mg (has no administration in time range)    Or  acetaminophen (TYLENOL) suppository 650 mg (has no administration in time range)  albuterol (PROVENTIL) (2.5 MG/3ML) 0.083% nebulizer solution 2.5 mg (has no administration in time range)  ondansetron (ZOFRAN) tablet 4 mg (has no administration in time range)    Or  ondansetron (ZOFRAN) injection 4 mg (has no administration in time range)  pantoprazole (PROTONIX) EC tablet 40 mg (has no administration in time range)  heparin ADULT infusion 100 units/mL (25000 units/23850mL sodium chloride 0.45%) (900 Units/hr Intravenous New Bag/Given 03/14/20 1851)  iohexol (OMNIPAQUE) 350 MG/ML injection 50 mL (50 mLs Intravenous Contrast Given 03/14/20  1347)  furosemide (LASIX) injection 40 mg (40 mg Intravenous Given 03/14/20 1436)  heparin bolus via infusion 3,000 Units (3,000 Units Intravenous Bolus from Bag 03/14/20 1851)    ED Course  I have reviewed the triage vital signs and the nursing notes.  Pertinent labs & imaging results that were available during my care of the patient were reviewed by me and considered in my medical decision making (see chart for details).    MDM Rules/Calculators/A&P                          I think patient is primarily having a CHF exacerbation.  She is somewhat tachypneic but is not in distress.  She is not hypoxic.  Given this, CTA was obtained which shows no obvious PE but some edema/effusions.  Patient was discharged from ED a couple days ago with steroid taper, which probably explains her elevated WBC.  Her ECG does show nonconducted P waves, consider Mobitz type II.  Discussed with Dr. Antoine PocheHochrein and cardiology will consult.  Dr. Benjamine MolaVann to admit. Final Clinical Impression(s) / ED Diagnoses Final diagnoses:  Acute on chronic congestive heart failure, unspecified heart failure type [facility](HCC)    Rx / DC Orders ED Discharge Orders    None       Pricilla LovelessGoldston, Eudell Julian, MD 03/14/20 2050

## 2020-03-14 NOTE — Progress Notes (Signed)
ANTICOAGULATION CONSULT NOTE - Initial Consult  Pharmacy Consult for heparin Indication: Unstable Angina  Allergies  Allergen Reactions  . Ceclor [Cefaclor] Other (See Comments)    Lost vision in eye  . Hctz [Hydrochlorothiazide] Other (See Comments)    Renal insufficiency  . Lipitor [Atorvastatin Calcium] Other (See Comments)    Increased A1C  . Norvasc [Amlodipine] Other (See Comments)    Makes patient stiff  . Penicillins Hives, Shortness Of Breath, Itching and Rash    Has patient had a PCN reaction causing immediate rash, facial/tongue/throat swelling, SOB or lightheadedness with hypotension: Yes Has patient had a PCN reaction causing severe rash involving mucus membranes or skin necrosis: Unk Has patient had a PCN reaction that required hospitalization: No Has patient had a PCN reaction occurring within the last 10 years: No If all of the above answers are "NO", then may proceed with Cephalosporin use.   . Sulfa Antibiotics Other (See Comments)    CAUSES SHOCK  . Statins Other (See Comments)    MYALGIAS AND WEAKNESS  . Erythromycin Rash  . Latex Rash  . Levofloxacin Rash  . Tetracyclines & Related Rash    Patient Measurements: Height: 5\' 2"  (157.5 cm) Weight: 99.9 kg (220 lb 3.2 oz) IBW/kg (Calculated) : 50.1 Heparin Dosing Weight: 74 kg  Vital Signs: Temp: 98.1 F (36.7 C) (07/04 1245) Temp Source: Oral (07/04 1245) BP: 135/58 (07/04 1705) Pulse Rate: 51 (07/04 1705)  Labs: Recent Labs    03/14/20 1123 03/14/20 1314  HGB 12.6  --   HCT 40.5  --   PLT 292  --   CREATININE 1.49*  --   TROPONINIHS 43* 58*    Estimated Creatinine Clearance: 37.2 mL/min (A) (by C-G formula based on SCr of 1.49 mg/dL (H)).   Medical History: Past Medical History:  Diagnosis Date  . Asthma   . Chronic diastolic CHF (congestive heart failure) (HCC)    a. 02/2011 Echo: EF 55-60%, Gr2 DD, Mild MR  . Coronary artery disease    a. s/p CABG x 1 2010:  LIMA->LAD.;  b. amdx  for CP => LHC 07/04/12: LAD 70-80%, mid RCA 30%, LIMA-LAD patent with competitive flow limiting distal LAD filling, EF 70% with hyperdynamic LV function. Medical therapy continued.  . Dyslipidemia   . GERD (gastroesophageal reflux disease)   . Hyperlipidemia   . Hypertension   . Hyperthyroidism   . Mitral regurgitation    a. mild by echo 02/2011.    Assessment: 74 yo W with unstable angina in the setting of arrhythmia starting on heparin. No AC PTA. H/H, plt stable, overall low bleed risk.    Goal of Therapy:  Heparin level 0.3-0.7 units/ml Monitor platelets by anticoagulation protocol: Yes   Plan:  Heparin 3000 units x1 bolus then 900 units/hr F/u 6hr HL Monitor daily HL, CBC/plt Monitor for signs/symptoms of bleeding   65, PharmD, BCPS, BCCP Clinical Pharmacist  Please check AMION for all [facility] Pharmacy phone numbers After 10:00 PM, call Main Pharmacy [phone]

## 2020-03-14 NOTE — H&P (Signed)
History and Physical    [name]:[id number] DOB: 06-30-1946 DOA: 03/14/2020  I have briefly reviewed the patient's prior medical records in [facility]  PCP: Mila Palmer, MD  Patient coming from: home  Chief Complaint: increasing dyspnea  HPI: Samantha Short is a 74 y.o. female with medical history significant of diastolic congestive heart failure, chronic kidney disease hyper thyroidism now hypothyroid.  She comes in via EMS reporting worsening chest pain and shortness of breath over the last 2 months.  Patient was found to be 88% on room air by EMS that she was placed on O2.  Was seen at [facility] a few days ago and diagnosed with an asthma exacerbation and started on a prednisone taper.  (Patient has not had asthma trouble since she was in her 30s).  Patient states she used to be active swimming prior to Covid.  She states she has gained weight due to decreased activity but also states that at she has not been able to mow her yard or walk to her mailbox recently due to shortness of breath  In the ER she had chest x-ray that was unremarkable, CTA that ruled out PE and showed small bilateral pleural effusions and a small hiatal hernia.  CT also showed evidence of prior granulomatous disease. Labs showed an elevated WBC count most likely due to steroids-stable chronic kidney disease.  Cardiology consult requested by ER physician   Review of Systems: As per HPI otherwise 10 point review of systems negative.   Past Medical History:  Diagnosis Date  . Asthma   . Chronic diastolic CHF (congestive heart failure) (HCC)    a. 02/2011 Echo: EF 55-60%, Gr2 DD, Mild MR  . Chronic kidney disease   . Coronary artery disease    a. s/p CABG x 1 2010:  LIMA->LAD.;  b. amdx for CP => LHC 07/04/12: LAD 70-80%, mid RCA 30%, LIMA-LAD patent with competitive flow limiting distal LAD filling, EF 70% with hyperdynamic LV function. Medical therapy continued.  . Dyslipidemia   . GERD  (gastroesophageal reflux disease)   . Hyperlipidemia   . Hypertension   . Hyperthyroidism   . Mitral regurgitation    a. mild by echo 02/2011.    Past Surgical History:  Procedure Laterality Date  . ABDOMINAL HYSTERECTOMY  1980  . CARDIAC CATHETERIZATION  07/27/09 & 07/28/09  . CESAREAN SECTION    . CESAREAN SECTION    . CORONARY ARTERY BYPASS GRAFT  08/03/2009   x1 using left internal mammary artery to distal left anterior  descending coronary artery.   Marland Kitchen GALLBLADDER SURGERY  2001  . LEFT HEART CATHETERIZATION WITH CORONARY/GRAFT ANGIOGRAM N/A 07/05/2012   Procedure: LEFT HEART CATHETERIZATION WITH Isabel Caprice;  Surgeon: Wendall Stade, MD;  Location: [facility] CATH LAB;  Service: Cardiovascular;  Laterality: N/A;  . SHOULDER SURGERY  1998 / 2001   from accident  . VESICOVAGINAL FISTULA CLOSURE W/ TAH  1998     reports that she quit smoking about 9 years ago. Her smoking use included cigarettes. She quit after 7.00 years of use. She has never used smokeless tobacco. She reports that she does not drink alcohol and does not use drugs.  Allergies  Allergen Reactions  . Ceclor [Cefaclor] Other (See Comments)    Lost vision in eye  . Hctz [Hydrochlorothiazide] Other (See Comments)    Renal insufficiency  . Lipitor [Atorvastatin Calcium] Other (See Comments)    Increased A1C  . Norvasc [Amlodipine] Other (See  Comments)    Makes patient stiff  . Penicillins Hives, Shortness Of Breath, Itching and Rash    Has patient had a PCN reaction causing immediate rash, facial/tongue/throat swelling, SOB or lightheadedness with hypotension: Yes Has patient had a PCN reaction causing severe rash involving mucus membranes or skin necrosis: Unk Has patient had a PCN reaction that required hospitalization: No Has patient had a PCN reaction occurring within the last 10 years: No If all of the above answers are "NO", then may proceed with Cephalosporin use.   . Sulfa Antibiotics Other (See  Comments)    CAUSES SHOCK  . Statins Other (See Comments)    MYALGIAS AND WEAKNESS  . Erythromycin Rash  . Latex Rash  . Levofloxacin Rash  . Tetracyclines & Related Rash    Family History  Problem Relation Age of Onset  . Cancer Father   . Heart attack Father   . High blood pressure Father   . High Cholesterol Father   . Heart disease Father   . Alcoholism Father   . Obesity Father   . Heart attack Mother   . Heart disease Mother        had pacemaker  . High blood pressure Mother   . High Cholesterol Mother   . Obesity Mother   . Cancer Brother   . Heart disease Brother   . Heart disease Sister   . Cancer Sister   . Angina Sister   . Coronary artery disease Son   . Hypertension Sister   . Hypertension Brother   . Thyroid disease Neg Hx     Prior to Admission medications   Medication Sig Start Date End Date Taking? Authorizing Provider  acetaminophen (TYLENOL) 325 MG tablet Take 325 mg by mouth every 6 (six) hours as needed for headache (pain).    Yes [provider]  albuterol (VENTOLIN HFA) 108 (90 Base) MCG/ACT inhaler Inhale 2 puffs into the lungs every 4 (four) hours as needed for wheezing or shortness of breath (asthma).  03/12/20  Yes [provider]  aspirin EC 81 MG tablet Take 81 mg by mouth at bedtime. Swallow whole.   Yes [provider]  Cholecalciferol (VITAMIN D) 50 MCG (2000 UT) CAPS Take 2,000 Units by mouth daily.    Yes [provider]  furosemide (LASIX) 20 MG tablet Take 20 mg by mouth daily.    Yes [provider]  furosemide (LASIX) 40 MG tablet Take 40 mg by mouth daily as needed for fluid or edema (leg swelling).    Yes [provider]  Investigational - Study Medication Take 1 tablet by mouth daily. Unnamed study/Medication Name/alternative to statins: Bempedoic Acid 180 mg vs placebo Managed by Dr. Jens Som. Start date March 2019/ original term 3 years - extended indefinitely   Yes [provider]  levothyroxine (SYNTHROID) 25 MCG tablet Take 1 tablet (25 mcg total) by mouth daily before breakfast. Patient taking differently: Take 12.5 mcg by mouth daily before breakfast.  01/26/20  Yes Romero Belling, MD  lisinopril (ZESTRIL) 40 MG tablet Take 1 tablet (40 mg total) by mouth daily. Patient taking differently: Take 40 mg by mouth at bedtime.  01/08/20  Yes Kathleene Hazel, MD  Multiple Vitamin (MULTIVITAMIN WITH MINERALS) TABS tablet Take 1 tablet by mouth daily.   Yes [provider]  Multiple Vitamins-Minerals (PRESERVISION AREDS 2) CAPS Take 1 capsule by mouth 2 (two) times daily.   Yes [provider]  omeprazole (PRILOSEC  OTC) 20 MG tablet Take 20 mg by mouth daily.   Yes [provider]  OVER THE COUNTER MEDICATION Take 1 tablet by mouth daily as needed (allergies). OTC allergy medication   Yes [provider]  predniSONE (DELTASONE) 10 MG tablet Take 10-40 mg by mouth See admin instructions. Tapered course started 03/12/2020: take 4 tablets (40 mg) by mouth daily for 2 days, then take 3 tablets (30 mg) daily for 2 days, then take 2 tablets (20 mg) daily for 2 days, then take 1 tablet (10 mg) daily for 2 days 03/12/20  Yes [provider]    Physical Exam: Vitals:   03/14/20 1113 03/14/20 1245 03/14/20 1300 03/14/20 1315  BP:  (!) 124/51 (!) 132/48 (!) 125/48  Pulse:  (!) 52 (!) 52 (!) 51  Resp:  20 (!) 24 (!) 27  Temp:  98.1 F (36.7 C)    TempSrc:  Oral    SpO2:  96% 99% 98%  Weight: 97.5 kg     Height: 5\' 2"  (1.575 m)         Constitutional: Appears winded while speaking Eyes: PERRL, lids and conjunctivae normal ENMT: Mucous membranes are moist. Posterior pharynx clear of any exudate or lesions.Normal dentition.  Neck: normal, supple, no masses, no thyromegaly Respiratory: No wheezing auscultated but crackles at bases Cardiovascular: Regular rate and rhythm, + murmur.  Nonpitting lower extremity edema. 2+  pedal pulses.  Abdomen: no tenderness, obese Musculoskeletal: no clubbing / cyanosis. Normal muscle tone.  Skin: no rashes, lesions, ulcers. No induration Neurologic: Moves all 4 extremities Psychiatric: Anxious  Labs on Admission: I have personally reviewed following labs and imaging studies  CBC: Recent Labs  Lab 03/14/20 1123  WBC 21.1*  NEUTROABS 16.9*  HGB 12.6  HCT 40.5  MCV 96.2  PLT 292   Basic Metabolic Panel: Recent Labs  Lab 03/14/20 1123  NA 143  K 4.4  CL 112*  CO2 18*  GLUCOSE 100*  BUN 37*  CREATININE 1.49*  CALCIUM 9.3   GFR: Estimated Creatinine Clearance: 36.7 mL/min (A) (by C-G formula based on SCr of 1.49 mg/dL (H)). Liver Function Tests: Recent Labs  Lab 03/14/20 1123  AST 44*  ALT 35  ALKPHOS 66  BILITOT 1.0  PROT 6.8  ALBUMIN 3.8   No results for input(s): LIPASE, AMYLASE in the last 168 hours. No results for input(s): AMMONIA in the last 168 hours. Coagulation Profile: No results for input(s): INR, PROTIME in the last 168 hours. Cardiac Enzymes: No results for input(s): CKTOTAL, CKMB, CKMBINDEX, TROPONINI in the last 168 hours. BNP (last 3 results) No results for input(s): PROBNP in the last 8760 hours. HbA1C: No results for input(s): HGBA1C in the last 72 hours. CBG: No results for input(s): GLUCAP in the last 168 hours. Lipid Profile: No results for input(s): CHOL, HDL, LDLCALC, TRIG, CHOLHDL, LDLDIRECT in the last 72 hours. Thyroid Function Tests: No results for input(s): TSH, T4TOTAL, FREET4, T3FREE, THYROIDAB in the last 72 hours. Anemia Panel: No results for input(s): VITAMINB12, FOLATE, FERRITIN, TIBC, IRON, RETICCTPCT in the last 72 hours. Urine analysis:    Component Value Date/Time   COLORURINE YELLOW 05/23/2018 2226   APPEARANCEUR HAZY (A) 05/23/2018 2226   LABSPEC 1.014 05/23/2018 2226   PHURINE 5.0 05/23/2018 2226   GLUCOSEU NEGATIVE 05/23/2018 2226   HGBUR NEGATIVE 05/23/2018 2226   BILIRUBINUR NEGATIVE  05/23/2018 2226   KETONESUR NEGATIVE 05/23/2018 2226   PROTEINUR NEGATIVE 05/23/2018 2226   UROBILINOGEN 0.2 01/13/2015  1545   NITRITE NEGATIVE 05/23/2018 2226   LEUKOCYTESUR LARGE (A) 05/23/2018 2226     Radiological Exams on Admission: DG Chest 2 View  Result Date: 03/14/2020 CLINICAL DATA:  Shortness of breath, chest pain EXAM: CHEST - 2 VIEW COMPARISON:  05/23/2018 FINDINGS: Status post median sternotomy. Multiple benign calcified nodules are present bilaterally. No acute appearing airspace opacity. The visualized skeletal structures are unremarkable. IMPRESSION: 1. No acute abnormality of the lungs. 2. Multiple benign calcified nodules bilaterally. Electronically Signed   By: Lauralyn PrimesAlex  Bibbey M.D.   On: 03/14/2020 11:41   CT Angio Chest PE W and/or Wo Contrast  Result Date: 03/14/2020 CLINICAL DATA:  Shortness of breath and chest pain worsening over the last 2 months. Abdominal distention. EXAM: CT ANGIOGRAPHY CHEST WITH CONTRAST TECHNIQUE: Multidetector CT imaging of the chest was performed using the standard protocol during bolus administration of intravenous contrast. Multiplanar CT image reconstructions and MIPs were obtained to evaluate the vascular anatomy. CONTRAST:  50mL OMNIPAQUE IOHEXOL 350 MG/ML SOLN COMPARISON:  Chest x-ray on 03/14/2020 FINDINGS: Cardiovascular: Heart is enlarged. There is focal atherosclerotic calcification of the coronary arteries. Median sternotomy and CABG. Pulmonary arteries are well opacified and there is no acute pulmonary embolus. Mediastinum/Nodes: The visualized portion of the thyroid gland has a normal appearance. Small hiatal hernia. Otherwise the esophagus is normal. There are multiple calcified mediastinal and hilar lymph nodes. No suspicious lymphadenopathy. Lungs/Pleura: There are small bilateral pleural effusions. Numerous calcified granulomata are identified throughout the lungs. No suspicious noncalcified mass is identified. There is minimal atelectasis  at the lung bases, RIGHT greater than LEFT. Upper Abdomen: Status post cholecystectomy. Calcified granulomata identified within the liver and spleen. Musculoskeletal: Median sternotomy. Remote wedge compression fracture of T5. Review of the MIP images confirms the above findings. IMPRESSION: 1. Technically adequate exam showing no acute pulmonary embolus. 2. Cardiomegaly, coronary artery disease, and prior CABG. 3. Small bilateral pleural effusions and minimal atelectasis at the lung bases, RIGHT greater than LEFT. 4. Evidence of prior granulomatous disease. 5. Small hiatal hernia. 6. Remote wedge compression fracture of T5. 7. Aortic Atherosclerosis (ICD10-I70.0). Electronically Signed   By: Norva PavlovElizabeth  Brown M.D.   On: 03/14/2020 14:10    EKG: Independently reviewed.  Appears to be sinus rhythm with PVCs on telemetry but EKG suggestive of second degree AV block and right bundle branch block  Assessment/Plan Active Problems:   Hypertension   Acid reflux   Chronic diastolic CHF (congestive heart failure) (HCC)   Hypothyroidism   Acute respiratory failure with hypoxia (HCC)    Acute respiratory failure -Questionable CHF exacerbation with an elevated BNP but x-ray unremarkable versus anxiety versus asthma/COPD exacerbation (only smoked as a teenager) -Trend cardiac enzymes -Repeat BNP in the a.m.  Worsening dyspnea and intolerance of activity -Cardiology consultation appreciated -EKG unchanged from prior Defer to cardiology  Post ablative hypothyroidism -Recheck TSH and free T4 -Continue Synthroid for now   Chronic kidney disease -Stage IIIa -Follows with Dr. Glenna FellowsKruska as an outpatient  Acid reflux -Has a hiatal hernia -Takes a PPI daily   DVT prophylaxis: Heparin Code Status: Full Family Communication: None at bedside Disposition Plan: Pending improvement of symptoms Consults called:  cardiology   Admission status: obs   At the point of initial evaluation, it is my clinical  opinion that admission for OBSERVATION is reasonable and necessary because the patient's presenting complaints in the context of their chronic conditions represent sufficient risk of deterioration or significant morbidity to constitute reasonable grounds for  close observation in the hospital setting, but that the patient may be medically stable for discharge from the hospital within 24 to 48 hours.   Joseph Art [facility]   How to contact the [facility] Attending or Consulting provider 7A - 7P or covering provider during after hours 7P -7A, for this patient?  1. Check the care team in [facility] and look for a) attending/consulting [facility] provider listed and b) the [facility] team listed 2. Log into [email or web address] and use [place]'s universal password to access. If you do not have the password, please contact the hospital operator. 3. Locate the [facility] provider you are looking for under [facility] and page to a number that you can be directly reached. 4. If you still have difficulty reaching the provider, please page the [facility] (Director on Call) for the Hospitalists listed on amion for assistance.  03/14/2020, 4:02 PM

## 2020-03-14 NOTE — Consult Note (Signed)
Cardiology Consultation:   Patient ID: Samantha Short; [id number]; [birth year]   Admit date: 03/14/2020 Date of Consult: 03/14/2020  Primary Care Provider: Mila Palmer, MD Primary Cardiologist: Samantha Carrow, MD    Patient Profile:   Samantha Short is a 74 y.o. female with a hx of CAD who is being seen today for the evaluation of an abnormal EKG  at the request of Samantha Short.  History of Present Illness:   Samantha Short has a history of CAD as below. She presents with chest pain.  She was hypoxemic on RA on presentation via EMS.  She was seen at [facility] recently with asthma exacerbation.  She was treated with steroids.  In the ED she had a negative CT for PE.     We are asked to see her because of an EKG suggesting 2:1 block.  (See below.)    Trop is mildly elevated but flat.    The patient says feeling well since she has had thyroid problems recently.  She reports that earlier this year she was able to swim and be very active without bringing on any symptoms.  However, since around April she has been getting increasing dyspnea to the point where she is short of breath walking a short distance on level ground.  She is having some vague chest discomfort in her upper epigastric under her left side.  However, she does not report substernal chest pressure, neck or arm discomfort.  She has had about a 10 pound weight gain over the last several months with some very mild lower extremity swelling.  Some of this might be related to decreased activity with Covid.  She has not noticed her heart going slow.  She has not had any syncope until the other day when she went to the emergency room.  She went because of shortness of breath as mentioned above and while she was walking in she almost had syncope.  She was seen last by Samantha Short in April.  Recent echo demonstrated NL EF.    Past Medical History:  Diagnosis Date  . Asthma   . Chronic diastolic CHF (congestive heart failure) (HCC)     a. 02/2011 Echo: EF 55-60%, Gr2 DD, Mild MR  . Chronic kidney disease   . Coronary artery disease    a. s/p CABG x 1 2010:  LIMA->LAD.;  b. amdx for CP => LHC 07/04/12: LAD 70-80%, mid RCA 30%, LIMA-LAD patent with competitive flow limiting distal LAD filling, EF 70% with hyperdynamic LV function. Medical therapy continued.  . Dyslipidemia   . GERD (gastroesophageal reflux disease)   . Hyperlipidemia   . Hypertension   . Hyperthyroidism   . Mitral regurgitation    a. mild by echo 02/2011.    Past Surgical History:  Procedure Laterality Date  . ABDOMINAL HYSTERECTOMY  1980  . CARDIAC CATHETERIZATION  07/27/09 & 07/28/09  . CESAREAN SECTION    . CESAREAN SECTION    . CORONARY ARTERY BYPASS GRAFT  08/03/2009   x1 using left internal mammary artery to distal left anterior  descending coronary artery.   Marland Kitchen GALLBLADDER SURGERY  2001  . LEFT HEART CATHETERIZATION WITH CORONARY/GRAFT ANGIOGRAM N/A 07/05/2012   Procedure: LEFT HEART CATHETERIZATION WITH Isabel Caprice;  Surgeon: Wendall Stade, MD;  Location: [facility] CATH LAB;  Service: Cardiovascular;  Laterality: N/A;  . SHOULDER SURGERY  1998 / 2001   from accident  . VESICOVAGINAL FISTULA CLOSURE W/ TAH  1998  Home Medications:  Prior to Admission medications   Medication Sig Start Date End Date Taking? Authorizing Provider  acetaminophen (TYLENOL) 325 MG tablet Take 325 mg by mouth every 6 (six) hours as needed for headache (pain).    Yes [provider]  albuterol (VENTOLIN HFA) 108 (90 Base) MCG/ACT inhaler Inhale 2 puffs into the lungs every 4 (four) hours as needed for wheezing or shortness of breath (asthma).  03/12/20  Yes [provider]  aspirin EC 81 MG tablet Take 81 mg by mouth at bedtime. Swallow whole.   Yes [provider]  Cholecalciferol (VITAMIN D) 50 MCG (2000 UT) CAPS Take 2,000 Units by mouth daily.    Yes [provider]  furosemide (LASIX) 20 MG tablet Take 20 mg by mouth  daily.    Yes [provider]  furosemide (LASIX) 40 MG tablet Take 40 mg by mouth daily as needed for fluid or edema (leg swelling).    Yes [provider]  Investigational - Study Medication Take 1 tablet by mouth daily. Unnamed study/Medication Name/alternative to statins: Bempedoic Acid 180 mg vs placebo Managed by Dr. Jens Som. Start date March 2019/ original term 3 years - extended indefinitely   Yes [provider]  levothyroxine (SYNTHROID) 25 MCG tablet Take 1 tablet (25 mcg total) by mouth daily before breakfast. Patient taking differently: Take 12.5 mcg by mouth daily before breakfast.  01/26/20  Yes Romero Belling, MD  lisinopril (ZESTRIL) 40 MG tablet Take 1 tablet (40 mg total) by mouth daily. Patient taking differently: Take 40 mg by mouth at bedtime.  01/08/20  Yes Kathleene Hazel, MD  Multiple Vitamin (MULTIVITAMIN WITH MINERALS) TABS tablet Take 1 tablet by mouth daily.   Yes [provider]  Multiple Vitamins-Minerals (PRESERVISION AREDS 2) CAPS Take 1 capsule by mouth 2 (two) times daily.   Yes [provider]  omeprazole (PRILOSEC OTC) 20 MG tablet Take 20 mg by mouth daily.   Yes [provider]  OVER THE COUNTER MEDICATION Take 1 tablet by mouth daily as needed (allergies). OTC allergy medication   Yes [provider]  predniSONE (DELTASONE) 10 MG tablet Take 10-40 mg by mouth See admin instructions. Tapered course started 03/12/2020: take 4 tablets (40 mg) by mouth daily for 2 days, then take 3 tablets (30 mg) daily for 2 days, then take 2 tablets (20 mg) daily for 2 days, then take 1 tablet (10 mg) daily for 2 days 03/12/20  Yes [provider]    Inpatient Medications: Scheduled Meds:  Continuous Infusions:  PRN Meds:   Allergies:    Allergies  Allergen Reactions  . Ceclor [Cefaclor] Other (See Comments)    Lost vision in eye  . Hctz [Hydrochlorothiazide] Other (See Comments)    Renal  insufficiency  . Lipitor [Atorvastatin Calcium] Other (See Comments)    Increased A1C  . Norvasc [Amlodipine] Other (See Comments)    Makes patient stiff  . Penicillins Hives, Shortness Of Breath, Itching and Rash    Has patient had a PCN reaction causing immediate rash, facial/tongue/throat swelling, SOB or lightheadedness with hypotension: Yes Has patient had a PCN reaction causing severe rash involving mucus membranes or skin necrosis: Unk Has patient had a PCN reaction that required hospitalization: No Has patient had a PCN reaction occurring within the last 10 years: No If all of the above answers are "NO", then may proceed with Cephalosporin use.   . Sulfa Antibiotics Other (See Comments)  CAUSES SHOCK  . Statins Other (See Comments)    MYALGIAS AND WEAKNESS  . Erythromycin Rash  . Latex Rash  . Levofloxacin Rash  . Tetracyclines & Related Rash    Social History:   Social History   Socioeconomic History  . Marital status: Widowed    Spouse name: Not on file  . Number of children: 3  . Years of education: Not on file  . Highest education level: Not on file  Occupational History  . Occupation: Retired- Engineer, site  Tobacco Use  . Smoking status: Former Smoker    Years: 7.00    Types: Cigarettes    Quit date: 07/12/2010    Years since quitting: 9.6  . Smokeless tobacco: Never Used  Vaping Use  . Vaping Use: Never used  Substance and Sexual Activity  . Alcohol use: No  . Drug use: No  . Sexual activity: Not on file  Other Topics Concern  . Not on file  Social History Narrative  . Not on file   Social Determinants of Health   Financial Resource Strain:   . Difficulty of Paying Living Expenses:   Food Insecurity:   . Worried About Programme researcher, broadcasting/film/video in the Last Year:   . Barista in the Last Year:   Transportation Needs:   . Freight forwarder (Medical):   Marland Kitchen Lack of Transportation (Non-Medical):   Physical Activity:   . Days of Exercise  per Week:   . Minutes of Exercise per Session:   Stress:   . Feeling of Stress :   Social Connections:   . Frequency of Communication with Friends and Family:   . Frequency of Social Gatherings with Friends and Family:   . Attends Religious Services:   . Active Member of Clubs or Organizations:   . Attends Banker Meetings:   Marland Kitchen Marital Status:   Intimate Partner Violence:   . Fear of Current or Ex-Partner:   . Emotionally Abused:   Marland Kitchen Physically Abused:   . Sexually Abused:     Family History:    Family History  Problem Relation Age of Onset  . Cancer Father   . Heart attack Father   . High blood pressure Father   . High Cholesterol Father   . Heart disease Father   . Alcoholism Father   . Obesity Father   . Heart attack Mother   . Heart disease Mother        had pacemaker  . High blood pressure Mother   . High Cholesterol Mother   . Obesity Mother   . Cancer Brother   . Heart disease Brother   . Heart disease Sister   . Cancer Sister   . Angina Sister   . Coronary artery disease Son   . Hypertension Sister   . Hypertension Brother   . Thyroid disease Neg Hx      ROS:  Please see the history of present illness.  ROS  All other ROS reviewed and negative.     Physical Exam/Data:   Vitals:   03/14/20 1113 03/14/20 1245 03/14/20 1300 03/14/20 1315  BP:  (!) 124/51 (!) 132/48 (!) 125/48  Pulse:  (!) 52 (!) 52 (!) 51  Resp:  20 (!) 24 (!) 27  Temp:  98.1 F (36.7 C)    TempSrc:  Oral    SpO2:  96% 99% 98%  Weight: 97.5 kg     Height:  (1.575 m)  No intake or output data in the 24 hours ending 03/14/20 1631 Filed Weights   03/14/20 1113  Weight: 97.5 kg   Body mass index is 39.32 kg/m.  GENERAL:  Well appearing HEENT:   Pupils equal round and reactive, fundi not visualized, oral mucosa unremarkable NECK:  No  jugular venous distention, waveform within normal limits, carotid upstroke brisk and symmetric, no bruits, no  thyromegaly LYMPHATICS:  No cervical, inguinal adenopathy LUNGS:   Clear to auscultation bilaterally BACK:  No CVA tenderness CHEST:   Unremarkable HEART:  PMI not displaced or sustained,S1 and S2 within normal limits, no S3, no S4, no clicks, no rubs, 2 out of 6 apical systolic murmur early peaking, no diastolic murmurs ABD:  Flat, positive bowel sounds normal in frequency in pitch, no bruits, no rebound, no guarding, no midline pulsatile mass, no hepatomegaly, no splenomegaly EXT:  2 plus pulses throughout,   trace lower extremity edema, no cyanosis no clubbing SKIN:  No rashes no nodules NEURO:   Cranial nerves II through XII grossly intact, motor grossly intact throughout PSYCH:    Cognitively intact, oriented to person place and time   EKG:  The EKG was personally reviewed and demonstrates: Sinus rhythm, rate 100, ventricular rate 50, right bundle branch block, 2-1 heart block  Telemetry:  Telemetry was personally reviewed and demonstrates:  NA  Relevant CV Studies:  1. Left ventricular ejection fraction, by estimation, is 60 to 65%. The left ventricle has normal function. The left ventricle has no regional wall motion abnormalities. There is mild left ventricular hypertrophy. Left ventricular diastolic parameters are indeterminate. The average left ventricular global longitudinal strain is -22.7 %. The global longitudinal strain is normal. 2. Right ventricular systolic function is low normal. The right ventricular size is normal. There is normal pulmonary artery systolic pressure. 3. The mitral valve is grossly normal. Trivial mitral valve regurgitation. 4. The aortic valve is tricuspid. Aortic valve regurgitation is not visualized. Mild to moderate aortic valve stenosis. Aortic valve area, by VTI measures 1.44 cm. Aortic valve mean gradient measures 12.0 mmHg. Aortic valve Vmax measures 2.33 m/s. DI - 0.45 5. The inferior vena cava is normal in size with greater than 50%  respiratory variability, suggesting right atrial pressure of 3 mmHg.   Laboratory Data:  Chemistry Recent Labs  Lab 03/14/20 1123  NA 143  K 4.4  CL 112*  CO2 18*  GLUCOSE 100*  BUN 37*  CREATININE 1.49*  CALCIUM 9.3  GFRNONAA 34*  GFRAA 40*  ANIONGAP 13    Recent Labs  Lab 03/14/20 1123  PROT 6.8  ALBUMIN 3.8  AST 44*  ALT 35  ALKPHOS 66  BILITOT 1.0   Hematology Recent Labs  Lab 03/14/20 1123  WBC 21.1*  RBC 4.21  HGB 12.6  HCT 40.5  MCV 96.2  MCH 29.9  MCHC 31.1  RDW 13.2  PLT 292   Cardiac EnzymesNo results for input(s): TROPONINI in the last 168 hours. No results for input(s): TROPIPOC in the last 168 hours.  BNP Recent Labs  Lab 03/14/20 1123  BNP 883.2*    DDimer No results for input(s): DDIMER in the last 168 hours.  Radiology/Studies:  DG Chest 2 View  Result Date: 03/14/2020 CLINICAL DATA:  Shortness of breath, chest pain EXAM: CHEST - 2 VIEW COMPARISON:  05/23/2018 FINDINGS: Status post median sternotomy. Multiple benign calcified nodules are present bilaterally. No acute appearing airspace opacity. The visualized skeletal structures are unremarkable. IMPRESSION: 1. No acute abnormality  of the lungs. 2. Multiple benign calcified nodules bilaterally. Electronically Signed   By: Lauralyn Primes M.D.   On: 03/14/2020 11:41   CT Angio Chest PE W and/or Wo Contrast  Result Date: 03/14/2020 CLINICAL DATA:  Shortness of breath and chest pain worsening over the last 2 months. Abdominal distention. EXAM: CT ANGIOGRAPHY CHEST WITH CONTRAST TECHNIQUE: Multidetector CT imaging of the chest was performed using the standard protocol during bolus administration of intravenous contrast. Multiplanar CT image reconstructions and MIPs were obtained to evaluate the vascular anatomy. CONTRAST:  11mL OMNIPAQUE IOHEXOL 350 MG/ML SOLN COMPARISON:  Chest x-ray on 03/14/2020 FINDINGS: Cardiovascular: Heart is enlarged. There is focal atherosclerotic calcification of the  coronary arteries. Median sternotomy and CABG. Pulmonary arteries are well opacified and there is no acute pulmonary embolus. Mediastinum/Nodes: The visualized portion of the thyroid gland has a normal appearance. Small hiatal hernia. Otherwise the esophagus is normal. There are multiple calcified mediastinal and hilar lymph nodes. No suspicious lymphadenopathy. Lungs/Pleura: There are small bilateral pleural effusions. Numerous calcified granulomata are identified throughout the lungs. No suspicious noncalcified mass is identified. There is minimal atelectasis at the lung bases, RIGHT greater than LEFT. Upper Abdomen: Status post cholecystectomy. Calcified granulomata identified within the liver and spleen. Musculoskeletal: Median sternotomy. Remote wedge compression fracture of T5. Review of the MIP images confirms the above findings. IMPRESSION: 1. Technically adequate exam showing no acute pulmonary embolus. 2. Cardiomegaly, coronary artery disease, and prior CABG. 3. Small bilateral pleural effusions and minimal atelectasis at the lung bases, RIGHT greater than LEFT. 4. Evidence of prior granulomatous disease. 5. Small hiatal hernia. 6. Remote wedge compression fracture of T5. 7. Aortic Atherosclerosis (ICD10-I70.0). Electronically Signed   By: Norva Pavlov M.D.   On: 03/14/2020 14:10    Assessment and Plan:   ARRHYTHMIA:  It does appear to be 2:1 block that was not evident on EKG previously.  Baseline RBBB unchanged .   She is not on any AV nodal blocking agents.  I think she might be having symptomatic bradycardia arrhythmias.  We should discuss this with the EP in the morning.  CAD: Medical management.  Note, beta blocker was stopped because of thyroid issues and perceived symptoms by the patient.  Trop are elevated in a non specific pattern.  I think given her acute decompensation which is not entirely explained by any pulmonary process, with the mildly elevated BNP she needs to be evaluated for  possible ischemic etiology and I would suggest measuring right heart pressures.  Therefore, I would suggest continuing the cycle enzymes and cardiac cath right and left on Monday.  I will start heparin.  We will continue her aspirin.  I think it is reasonable to continue her lisinopril unless her creatinine jumps.  DYSLIPIDEMIA:  Patient did not tolerate statin or Zetia and does not want to consider PCSK9 inhibitor.    ACUTE RESPIRATORY FAILURE:  Being treated for asthma/COPD.  Might be a component of diastolic dysfunction.  However, no evident volume overload on CXR and BNP only mildly elevated.  I agree with the dose of IV Lasix that was given in the ED and then probably resume PO in the morning.   CHRONIC DIASTOLIC HF:  See above  MILD MR:  Trivial on recent echo.   CKD IIIA:  Creat is mildly elevated compared to earlier in the year.  Follow.  She might need to have a creatinine held in the morning.  MILD AS:   Mild to moderate as  above.  Follow clinically.     For questions or updates, please contact [facility] Please consult [email or web address] for contact info under Cardiology/STEMI.   Signed, Rollene RotundaJames Missael Ferrari, MD  03/14/2020 4:31 PM

## 2020-03-14 NOTE — ED Notes (Signed)
Patient transported to X-ray 

## 2020-03-14 NOTE — ED Notes (Signed)
Patient transported to CT 

## 2020-03-15 DIAGNOSIS — N179 Acute kidney failure, unspecified: Secondary | ICD-10-CM | POA: Diagnosis not present

## 2020-03-15 DIAGNOSIS — I509 Heart failure, unspecified: Secondary | ICD-10-CM

## 2020-03-15 DIAGNOSIS — Z880 Allergy status to penicillin: Secondary | ICD-10-CM | POA: Diagnosis not present

## 2020-03-15 DIAGNOSIS — I340 Nonrheumatic mitral (valve) insufficiency: Secondary | ICD-10-CM | POA: Diagnosis not present

## 2020-03-15 DIAGNOSIS — I442 Atrioventricular block, complete: Secondary | ICD-10-CM | POA: Diagnosis not present

## 2020-03-15 DIAGNOSIS — N1831 Chronic kidney disease, stage 3a: Secondary | ICD-10-CM | POA: Diagnosis not present

## 2020-03-15 DIAGNOSIS — Z888 Allergy status to other drugs, medicaments and biological substances status: Secondary | ICD-10-CM | POA: Diagnosis not present

## 2020-03-15 DIAGNOSIS — Z951 Presence of aortocoronary bypass graft: Secondary | ICD-10-CM | POA: Diagnosis not present

## 2020-03-15 DIAGNOSIS — E785 Hyperlipidemia, unspecified: Secondary | ICD-10-CM | POA: Diagnosis present

## 2020-03-15 DIAGNOSIS — R911 Solitary pulmonary nodule: Secondary | ICD-10-CM | POA: Diagnosis not present

## 2020-03-15 DIAGNOSIS — I5032 Chronic diastolic (congestive) heart failure: Secondary | ICD-10-CM | POA: Diagnosis not present

## 2020-03-15 DIAGNOSIS — T82847A Pain from cardiac prosthetic devices, implants and grafts, initial encounter: Secondary | ICD-10-CM | POA: Diagnosis not present

## 2020-03-15 DIAGNOSIS — T380X5A Adverse effect of glucocorticoids and synthetic analogues, initial encounter: Secondary | ICD-10-CM | POA: Diagnosis present

## 2020-03-15 DIAGNOSIS — Z91040 Latex allergy status: Secondary | ICD-10-CM | POA: Diagnosis not present

## 2020-03-15 DIAGNOSIS — I441 Atrioventricular block, second degree: Secondary | ICD-10-CM

## 2020-03-15 DIAGNOSIS — I5033 Acute on chronic diastolic (congestive) heart failure: Secondary | ICD-10-CM | POA: Diagnosis not present

## 2020-03-15 DIAGNOSIS — Z881 Allergy status to other antibiotic agents status: Secondary | ICD-10-CM | POA: Diagnosis not present

## 2020-03-15 DIAGNOSIS — J9601 Acute respiratory failure with hypoxia: Secondary | ICD-10-CM | POA: Diagnosis not present

## 2020-03-15 DIAGNOSIS — I10 Essential (primary) hypertension: Secondary | ICD-10-CM | POA: Diagnosis not present

## 2020-03-15 DIAGNOSIS — N1832 Chronic kidney disease, stage 3b: Secondary | ICD-10-CM | POA: Diagnosis not present

## 2020-03-15 DIAGNOSIS — J9811 Atelectasis: Secondary | ICD-10-CM | POA: Diagnosis not present

## 2020-03-15 DIAGNOSIS — D71 Functional disorders of polymorphonuclear neutrophils: Secondary | ICD-10-CM | POA: Diagnosis present

## 2020-03-15 DIAGNOSIS — K219 Gastro-esophageal reflux disease without esophagitis: Secondary | ICD-10-CM | POA: Diagnosis present

## 2020-03-15 DIAGNOSIS — N183 Chronic kidney disease, stage 3 unspecified: Secondary | ICD-10-CM | POA: Diagnosis not present

## 2020-03-15 DIAGNOSIS — K449 Diaphragmatic hernia without obstruction or gangrene: Secondary | ICD-10-CM | POA: Diagnosis present

## 2020-03-15 DIAGNOSIS — E890 Postprocedural hypothyroidism: Secondary | ICD-10-CM | POA: Diagnosis present

## 2020-03-15 DIAGNOSIS — Z20822 Contact with and (suspected) exposure to covid-19: Secondary | ICD-10-CM | POA: Diagnosis not present

## 2020-03-15 DIAGNOSIS — E859 Amyloidosis, unspecified: Secondary | ICD-10-CM | POA: Diagnosis not present

## 2020-03-15 DIAGNOSIS — I25110 Atherosclerotic heart disease of native coronary artery with unstable angina pectoris: Secondary | ICD-10-CM | POA: Diagnosis not present

## 2020-03-15 DIAGNOSIS — Z6841 Body Mass Index (BMI) 40.0 and over, adult: Secondary | ICD-10-CM | POA: Diagnosis not present

## 2020-03-15 DIAGNOSIS — I130 Hypertensive heart and chronic kidney disease with heart failure and stage 1 through stage 4 chronic kidney disease, or unspecified chronic kidney disease: Secondary | ICD-10-CM | POA: Diagnosis not present

## 2020-03-15 DIAGNOSIS — I350 Nonrheumatic aortic (valve) stenosis: Secondary | ICD-10-CM | POA: Diagnosis not present

## 2020-03-15 DIAGNOSIS — N170 Acute kidney failure with tubular necrosis: Secondary | ICD-10-CM | POA: Diagnosis not present

## 2020-03-15 DIAGNOSIS — J449 Chronic obstructive pulmonary disease, unspecified: Secondary | ICD-10-CM | POA: Diagnosis present

## 2020-03-15 DIAGNOSIS — I200 Unstable angina: Secondary | ICD-10-CM | POA: Diagnosis not present

## 2020-03-15 DIAGNOSIS — Z882 Allergy status to sulfonamides status: Secondary | ICD-10-CM | POA: Diagnosis not present

## 2020-03-15 LAB — CBC
HCT: 35.9 % — ABNORMAL LOW (ref 36.0–46.0)
Hemoglobin: 11.5 g/dL — ABNORMAL LOW (ref 12.0–15.0)
MCH: 30.2 pg (ref 26.0–34.0)
MCHC: 32 g/dL (ref 30.0–36.0)
MCV: 94.2 fL (ref 80.0–100.0)
Platelets: 248 10*3/uL (ref 150–400)
RBC: 3.81 MIL/uL — ABNORMAL LOW (ref 3.87–5.11)
RDW: 13.2 % (ref 11.5–15.5)
WBC: 13.5 10*3/uL — ABNORMAL HIGH (ref 4.0–10.5)
nRBC: 0 % (ref 0.0–0.2)

## 2020-03-15 LAB — HEPARIN LEVEL (UNFRACTIONATED)
Heparin Unfractionated: 0.35 IU/mL (ref 0.30–0.70)
Heparin Unfractionated: 0.51 IU/mL (ref 0.30–0.70)

## 2020-03-15 LAB — BASIC METABOLIC PANEL
Anion gap: 9 (ref 5–15)
BUN: 32 mg/dL — ABNORMAL HIGH (ref 8–23)
CO2: 21 mmol/L — ABNORMAL LOW (ref 22–32)
Calcium: 8.7 mg/dL — ABNORMAL LOW (ref 8.9–10.3)
Chloride: 109 mmol/L (ref 98–111)
Creatinine, Ser: 1.63 mg/dL — ABNORMAL HIGH (ref 0.44–1.00)
GFR calc Af Amer: 36 mL/min — ABNORMAL LOW (ref >60)
GFR calc non Af Amer: 31 mL/min — ABNORMAL LOW (ref >60)
Glucose, Bld: 91 mg/dL (ref 70–99)
Potassium: 4 mmol/L (ref 3.5–5.1)
Sodium: 139 mmol/L (ref 135–145)

## 2020-03-15 LAB — BRAIN NATRIURETIC PEPTIDE: B Natriuretic Peptide: 563.7 pg/mL — ABNORMAL HIGH (ref 0.0–100.0)

## 2020-03-15 MED ORDER — SODIUM CHLORIDE 0.9% FLUSH
3.0000 mL | Freq: Two times a day (BID) | INTRAVENOUS | Status: DC
  Administered 2020-03-15 – 2020-03-16 (×2): 3 mL via INTRAVENOUS

## 2020-03-15 NOTE — Plan of Care (Signed)
°  Problem: Education: °Goal: Ability to demonstrate management of disease process will improve °Outcome: Progressing °Goal: Ability to verbalize understanding of medication therapies will improve °Outcome: Progressing °Goal: Individualized Educational Video(s) °Outcome: Progressing °  °

## 2020-03-15 NOTE — Progress Notes (Signed)
PROGRESS NOTE    Samantha Short  XMI:[id number] DOB: [birth year] DOA: 03/14/2020 PCP: Mila Palmer, MD    Brief Narrative:  74 y.o. female with medical history significant of diastolic congestive heart failure, chronic kidney disease hyper thyroidism now hypothyroid.  She comes in via EMS reporting worsening chest pain and shortness of breath over the last 2 months.  Patient was found to be 88% on room air by EMS that she was placed on O2.  Was seen at [facility] a few days ago and diagnosed with an asthma exacerbation and started on a prednisone taper.  (Patient has not had asthma trouble since she was in her 30s).  Patient states she used to be active swimming prior to Covid.  She states she has gained weight due to decreased activity but also states that at she has not been able to mow her yard or walk to her mailbox recently due to shortness of breath  In the ER she had chest x-ray that was unremarkable, CTA that ruled out PE and showed small bilateral pleural effusions and a small hiatal hernia.  CT also showed evidence of prior granulomatous disease. Labs showed an elevated WBC count most likely due to steroids-stable chronic kidney disease.  Cardiology consult requested by ER physician  Assessment & Plan:   Active Problems:   Hypertension   Acid reflux   Chronic diastolic CHF (congestive heart failure) (HCC)   Hypothyroidism   Acute respiratory failure with hypoxia (HCC)  Acute respiratory failure with CAD -Possible CHF exacerbation with an elevated BNP but x-ray unremarkable versus anxiety versus asthma/COPD exacerbation (only smoked as a teenager) -serial trop stable around mid 50's -Repeat BNP of 563 -Cardiology now following. Concern of high degree heart block. EP following, recommendation for heart cath and if neg, then possible cMRI  Worsening dyspnea and intolerance of activity -Cardiology consulted per above -cont as per Cardiology recs above  Post ablative  hypothyroidism -TSH 5.79 and free T4 1.12 -Continue Synthroid for now   Chronic kidney disease -Stage IIIa -Follows with Dr. Glenna Fellows as an outpatient -Cr 1.63 today -Will repeat bmet in AM  Acid reflux -Has a hiatal hernia -Takes a PPI daily  DVT prophylaxis: Heparin gtt Code Status: Full Family Communication: Pt in room, family not at bedside  Status is: Inpatient  Remains inpatient appropriate because:Ongoing diagnostic testing needed not appropriate for outpatient work up, IV treatments appropriate due to intensity of illness or inability to take PO and Inpatient level of care appropriate due to severity of illness   Dispo: The patient is from: Home              Anticipated d/c is to: Home              Anticipated d/c date is: 2 days              Patient currently is not medically stable to d/c.       Consultants:   Cardiology  EP  Procedures:     Antimicrobials: Anti-infectives (From admission, onward)   None       Subjective: Without complaints or chest pain this AM  Objective: Vitals:   03/14/20 1935 03/15/20 0029 03/15/20 0348 03/15/20 1152  BP: (!) 128/53 (!) 106/43 130/66 136/61  Pulse: (!) 51 (!) 48 (!) 46 (!) 47  Resp: 20 20 20 16   Temp: 98.5 F (36.9 C) 97.9 F (36.6 C) 98.1 F (36.7 C) 97.8 F (36.6 C)  TempSrc: Oral Oral  Oral   SpO2: 96% 96% 95% 100%  Weight:   98.6 kg   Height:        Intake/Output Summary (Last 24 hours) at 03/15/2020 1626 Last data filed at 03/15/2020 1300 Gross per 24 hour  Intake 1072.08 ml  Output 2200 ml  Net -1127.92 ml   Filed Weights   03/14/20 1113 03/14/20 1705 03/15/20 0348  Weight: 97.5 kg 99.9 kg 98.6 kg    Examination:  General exam: Appears calm and comfortable  Respiratory system: Clear to auscultation. Respiratory effort normal. Cardiovascular system: S1 & S2 heard, Regular Gastrointestinal system: Abdomen is nondistended, soft and nontender. No organomegaly or masses felt. Normal  bowel sounds heard. Central nervous system: Alert and oriented. No focal neurological deficits. Extremities: Symmetric 5 x 5 power. Skin: No rashes, lesions  Psychiatry: Judgement and insight appear normal. Mood & affect appropriate.   Data Reviewed: I have personally reviewed following labs and imaging studies  CBC: Recent Labs  Lab 03/14/20 1123 03/15/20 0154  WBC 21.1* 13.5*  NEUTROABS 16.9*  --   HGB 12.6 11.5*  HCT 40.5 35.9*  MCV 96.2 94.2  PLT 292 248   Basic Metabolic Panel: Recent Labs  Lab 03/14/20 1123 03/15/20 0154  NA 143 139  K 4.4 4.0  CL 112* 109  CO2 18* 21*  GLUCOSE 100* 91  BUN 37* 32*  CREATININE 1.49* 1.63*  CALCIUM 9.3 8.7*   GFR: Estimated Creatinine Clearance: 33.7 mL/min (A) (by C-G formula based on SCr of 1.63 mg/dL (H)). Liver Function Tests: Recent Labs  Lab 03/14/20 1123  AST 44*  ALT 35  ALKPHOS 66  BILITOT 1.0  PROT 6.8  ALBUMIN 3.8   No results for input(s): LIPASE, AMYLASE in the last 168 hours. No results for input(s): AMMONIA in the last 168 hours. Coagulation Profile: No results for input(s): INR, PROTIME in the last 168 hours. Cardiac Enzymes: No results for input(s): CKTOTAL, CKMB, CKMBINDEX, TROPONINI in the last 168 hours. BNP (last 3 results) No results for input(s): PROBNP in the last 8760 hours. HbA1C: No results for input(s): HGBA1C in the last 72 hours. CBG: No results for input(s): GLUCAP in the last 168 hours. Lipid Profile: No results for input(s): CHOL, HDL, LDLCALC, TRIG, CHOLHDL, LDLDIRECT in the last 72 hours. Thyroid Function Tests: Recent Labs    03/14/20 1946  TSH 5.790*  FREET4 1.12   Anemia Panel: No results for input(s): VITAMINB12, FOLATE, FERRITIN, TIBC, IRON, RETICCTPCT in the last 72 hours. Sepsis Labs: No results for input(s): PROCALCITON, LATICACIDVEN in the last 168 hours.  Recent Results (from the past 240 hour(s))  SARS Coronavirus 2 by RT PCR (hospital order, performed in [facility] hospital lab) Nasopharyngeal Nasopharyngeal Swab     Status: None   Collection Time: 03/14/20 11:36 AM   Specimen: Nasopharyngeal Swab  Result Value Ref Range Status   SARS Coronavirus 2 NEGATIVE NEGATIVE Final    Comment: (NOTE) SARS-CoV-2 target nucleic acids are NOT DETECTED.  The SARS-CoV-2 RNA is generally detectable in upper and lower respiratory specimens during the acute phase of infection. The lowest concentration of SARS-CoV-2 viral copies this assay can detect is 250 copies / mL. A negative result does not preclude SARS-CoV-2 infection and should not be used as the sole basis for treatment or other patient management decisions.  A negative result may occur with improper specimen collection / handling, submission of specimen other than nasopharyngeal swab, presence of viral mutation(s) within the areas targeted by  this assay, and inadequate number of viral copies (<250 copies / mL). A negative result must be combined with clinical observations, patient history, and epidemiological information.  Fact Sheet for Patients:   BoilerBrush.com.cy  Fact Sheet for Healthcare Providers: https://pope.com/  This test is not yet approved or  cleared by the [place] FDA and has been authorized for detection and/or diagnosis of SARS-CoV-2 by FDA under an Emergency Use Authorization (EUA).  This EUA will remain in effect (meaning this test can be used) for the duration of the COVID-19 declaration under Section 564(b)(1) of the Act, 21 U.S.C. section 360bbb-3(b)(1), unless the authorization is terminated or revoked sooner.  Performed at [facility], [address], [place], [place] [address]      Radiology Studies: DG Chest 2 View  Result Date: 03/14/2020 CLINICAL DATA:  Shortness of breath, chest pain EXAM: CHEST - 2 VIEW COMPARISON:  05/23/2018 FINDINGS: Status post median sternotomy. Multiple benign calcified  nodules are present bilaterally. No acute appearing airspace opacity. The visualized skeletal structures are unremarkable. IMPRESSION: 1. No acute abnormality of the lungs. 2. Multiple benign calcified nodules bilaterally. Electronically Signed   By: Lauralyn Primes M.D.   On: 03/14/2020 11:41   CT Angio Chest PE W and/or Wo Contrast  Result Date: 03/14/2020 CLINICAL DATA:  Shortness of breath and chest pain worsening over the last 2 months. Abdominal distention. EXAM: CT ANGIOGRAPHY CHEST WITH CONTRAST TECHNIQUE: Multidetector CT imaging of the chest was performed using the standard protocol during bolus administration of intravenous contrast. Multiplanar CT image reconstructions and MIPs were obtained to evaluate the vascular anatomy. CONTRAST:  44mL OMNIPAQUE IOHEXOL 350 MG/ML SOLN COMPARISON:  Chest x-ray on 03/14/2020 FINDINGS: Cardiovascular: Heart is enlarged. There is focal atherosclerotic calcification of the coronary arteries. Median sternotomy and CABG. Pulmonary arteries are well opacified and there is no acute pulmonary embolus. Mediastinum/Nodes: The visualized portion of the thyroid gland has a normal appearance. Small hiatal hernia. Otherwise the esophagus is normal. There are multiple calcified mediastinal and hilar lymph nodes. No suspicious lymphadenopathy. Lungs/Pleura: There are small bilateral pleural effusions. Numerous calcified granulomata are identified throughout the lungs. No suspicious noncalcified mass is identified. There is minimal atelectasis at the lung bases, RIGHT greater than LEFT. Upper Abdomen: Status post cholecystectomy. Calcified granulomata identified within the liver and spleen. Musculoskeletal: Median sternotomy. Remote wedge compression fracture of T5. Review of the MIP images confirms the above findings. IMPRESSION: 1. Technically adequate exam showing no acute pulmonary embolus. 2. Cardiomegaly, coronary artery disease, and prior CABG. 3. Small bilateral pleural  effusions and minimal atelectasis at the lung bases, RIGHT greater than LEFT. 4. Evidence of prior granulomatous disease. 5. Small hiatal hernia. 6. Remote wedge compression fracture of T5. 7. Aortic Atherosclerosis (ICD10-I70.0). Electronically Signed   By: Norva Pavlov M.D.   On: 03/14/2020 14:10    Scheduled Meds: . aspirin EC  81 mg Oral QHS  . cholecalciferol  2,000 Units Oral Daily  . furosemide  20 mg Oral Daily  . levothyroxine  12.5 mcg Oral Q0600  . multivitamin with minerals  1 tablet Oral Daily  . pantoprazole  40 mg Oral Daily  . sodium chloride flush  3 mL Intravenous Q12H   Continuous Infusions: . heparin 900 Units/hr (03/15/20 3614)     LOS: 0 days   Rickey Barbara, MD [facility] Pager On Amion  If 7PM-7AM, please contact night-coverage 03/15/2020, 4:26 PM

## 2020-03-15 NOTE — Progress Notes (Signed)
Talked with Dr. Graciela Husbands and unless pt is unstable plan for eval later today and earliest PPM would be tomorrow.  If she were to decompensate then temp pacer would be appropriate.  Currently pt without chest pain or SOB.  HR lowest 45.

## 2020-03-15 NOTE — Progress Notes (Signed)
ANTICOAGULATION CONSULT NOTE   Pharmacy Consult for heparin Indication: Unstable Angina  Allergies  Allergen Reactions  . Ceclor [Cefaclor] Other (See Comments)    Lost vision in eye  . Hctz [Hydrochlorothiazide] Other (See Comments)    Renal insufficiency  . Lipitor [Atorvastatin Calcium] Other (See Comments)    Increased A1C  . Norvasc [Amlodipine] Other (See Comments)    Makes patient stiff  . Penicillins Hives, Shortness Of Breath, Itching and Rash    Has patient had a PCN reaction causing immediate rash, facial/tongue/throat swelling, SOB or lightheadedness with hypotension: Yes Has patient had a PCN reaction causing severe rash involving mucus membranes or skin necrosis: Unk Has patient had a PCN reaction that required hospitalization: No Has patient had a PCN reaction occurring within the last 10 years: No If all of the above answers are "NO", then may proceed with Cephalosporin use.   . Sulfa Antibiotics Other (See Comments)    CAUSES SHOCK  . Statins Other (See Comments)    MYALGIAS AND WEAKNESS  . Erythromycin Rash  . Latex Rash  . Levofloxacin Rash  . Tetracyclines & Related Rash    Patient Measurements: Height: 5\' 2"  (157.5 cm) Weight: 98.6 kg (217 lb 4.8 oz) (scale b) IBW/kg (Calculated) : 50.1 Heparin Dosing Weight: 74 kg  Vital Signs: Temp: 98.1 F (36.7 C) (07/05 0348) Temp Source: Oral (07/05 0348) BP: 130/66 (07/05 0348) Pulse Rate: 46 (07/05 0348)  Labs: Recent Labs    03/14/20 1123 03/14/20 1314 03/14/20 1946 03/15/20 0154  HGB 12.6  --   --  11.5*  HCT 40.5  --   --  35.9*  PLT 292  --   --  248  HEPARINUNFRC  --   --   --  0.35  CREATININE 1.49*  --   --  1.63*  TROPONINIHS 43* 58* 57*  --     Estimated Creatinine Clearance: 33.7 mL/min (A) (by C-G formula based on SCr of 1.63 mg/dL (H)).   Medical History: Past Medical History:  Diagnosis Date  . Asthma   . Chronic diastolic CHF (congestive heart failure) (HCC)    a. 02/2011  Echo: EF 55-60%, Gr2 DD, Mild MR  . Coronary artery disease    a. s/p CABG x 1 2010:  LIMA->LAD.;  b. amdx for CP => LHC 07/04/12: LAD 70-80%, mid RCA 30%, LIMA-LAD patent with competitive flow limiting distal LAD filling, EF 70% with hyperdynamic LV function. Medical therapy continued.  . Dyslipidemia   . GERD (gastroesophageal reflux disease)   . Hyperlipidemia   . Hypertension   . Hyperthyroidism   . Mitral regurgitation    a. mild by echo 02/2011.    Assessment: 74 yo W with unstable angina in the setting of arrhythmia starting on heparin. No AC PTA. H/H, plt stable, overall low bleed risk.   Heparin continues to be at goal on confirmatory lab. Hgb down slightly to 11.5, plt wnl. No bleeding noted.   Goal of Therapy:  Heparin level 0.3-0.7 units/ml Monitor platelets by anticoagulation protocol: Yes   Plan:  Continue heparin at 900 units/hr Daily heparin level and cbc Possible PPM in am  65 PharmD., BCPS Clinical Pharmacist 03/15/2020 9:10 AM  Please check AMION for all [facility] phone numbers After 10:00 PM, call Main Pharmacy [phone]

## 2020-03-15 NOTE — Plan of Care (Signed)
?  Problem: Education: ?Goal: Ability to demonstrate management of disease process will improve ?Outcome: Progressing ?  ?Problem: Education: ?Goal: Ability to verbalize understanding of medication therapies will improve ?Outcome: Progressing ?  ?Problem: Activity: ?Goal: Capacity to carry out activities will improve ?Outcome: Progressing ?  ?Problem: Education: ?Goal: Knowledge of General Education information will improve ?Description: Including pain rating scale, medication(s)/side effects and non-pharmacologic comfort measures ?Outcome: Progressing ?  ?

## 2020-03-15 NOTE — Progress Notes (Signed)
Progress Note  Patient Name: Samantha Short Date of Encounter: 03/15/2020  [facility] Cardiologist: Verne Carrow, MD   Subjective   No chest pain no shortness of breath, feels better.   Inpatient Medications    Scheduled Meds: . aspirin EC  81 mg Oral QHS  . cholecalciferol  2,000 Units Oral Daily  . furosemide  20 mg Oral Daily  . levothyroxine  12.5 mcg Oral Q0600  . multivitamin with minerals  1 tablet Oral Daily  . pantoprazole  40 mg Oral Daily   Continuous Infusions: . heparin 900 Units/hr (03/15/20 6010)   PRN Meds: acetaminophen **OR** acetaminophen, albuterol, ondansetron **OR** ondansetron (ZOFRAN) IV   Vital Signs    Vitals:   03/14/20 1705 03/14/20 1935 03/15/20 0029 03/15/20 0348  BP: (!) 135/58 (!) 128/53 (!) 106/43 130/66  Pulse: (!) 51 (!) 51 (!) 48 (!) 46  Resp: 20 20 20 20   Temp:  98.5 F (36.9 C) 97.9 F (36.6 C) 98.1 F (36.7 C)  TempSrc:  Oral Oral Oral  SpO2: 98% 96% 96% 95%  Weight: 99.9 kg   98.6 kg  Height: 5\' 2"  (1.575 m)       Intake/Output Summary (Last 24 hours) at 03/15/2020 1135 Last data filed at 03/15/2020 05/16/2020 Gross per 24 hour  Intake 592.08 ml  Output 2200 ml  Net -1607.92 ml   Last 3 Weights 03/15/2020 03/14/2020 03/14/2020  Weight (lbs) 217 lb 4.8 oz 220 lb 3.2 oz 215 lb  Weight (kg) 98.567 kg 99.882 kg 97.523 kg      Telemetry    2-1 AV block heart rate 45 bpm, brief transient third-degree AV block- Personally Reviewed  ECG    2-1 AV block- Personally Reviewed  Physical Exam   GEN: No acute distress.   Neck: No JVD Cardiac: Bradycardic regular, 2/6 SM, no rubs, or gallops.  Respiratory: Clear to auscultation bilaterally. GI: Soft, nontender, non-distended  MS: No edema; No deformity. Neuro:  Nonfocal  Psych: Normal affect   Labs    High Sensitivity Troponin:   Recent Labs  Lab 03/14/20 1123 03/14/20 1314 03/14/20 1946  TROPONINIHS 43* 58* 57*      Chemistry Recent Labs  Lab  03/14/20 1123 03/15/20 0154  NA 143 139  K 4.4 4.0  CL 112* 109  CO2 18* 21*  GLUCOSE 100* 91  BUN 37* 32*  CREATININE 1.49* 1.63*  CALCIUM 9.3 8.7*  PROT 6.8  --   ALBUMIN 3.8  --   AST 44*  --   ALT 35  --   ALKPHOS 66  --   BILITOT 1.0  --   GFRNONAA 34* 31*  GFRAA 40* 36*  ANIONGAP 13 9     Hematology Recent Labs  Lab 03/14/20 1123 03/15/20 0154  WBC 21.1* 13.5*  RBC 4.21 3.81*  HGB 12.6 11.5*  HCT 40.5 35.9*  MCV 96.2 94.2  MCH 29.9 30.2  MCHC 31.1 32.0  RDW 13.2 13.2  PLT 292 248    BNP Recent Labs  Lab 03/14/20 1123 03/15/20 0154  BNP 883.2* 563.7*     DDimer No results for input(s): DDIMER in the last 168 hours.   Radiology    DG Chest 2 View  Result Date: 03/14/2020 CLINICAL DATA:  Shortness of breath, chest pain EXAM: CHEST - 2 VIEW COMPARISON:  05/23/2018 FINDINGS: Status post median sternotomy. Multiple benign calcified nodules are present bilaterally. No acute appearing airspace opacity. The visualized skeletal structures are unremarkable. IMPRESSION:  1. No acute abnormality of the lungs. 2. Multiple benign calcified nodules bilaterally. Electronically Signed   By: Lauralyn Primes M.D.   On: 03/14/2020 11:41   CT Angio Chest PE W and/or Wo Contrast  Result Date: 03/14/2020 CLINICAL DATA:  Shortness of breath and chest pain worsening over the last 2 months. Abdominal distention. EXAM: CT ANGIOGRAPHY CHEST WITH CONTRAST TECHNIQUE: Multidetector CT imaging of the chest was performed using the standard protocol during bolus administration of intravenous contrast. Multiplanar CT image reconstructions and MIPs were obtained to evaluate the vascular anatomy. CONTRAST:  80mL OMNIPAQUE IOHEXOL 350 MG/ML SOLN COMPARISON:  Chest x-ray on 03/14/2020 FINDINGS: Cardiovascular: Heart is enlarged. There is focal atherosclerotic calcification of the coronary arteries. Median sternotomy and CABG. Pulmonary arteries are well opacified and there is no acute pulmonary  embolus. Mediastinum/Nodes: The visualized portion of the thyroid gland has a normal appearance. Small hiatal hernia. Otherwise the esophagus is normal. There are multiple calcified mediastinal and hilar lymph nodes. No suspicious lymphadenopathy. Lungs/Pleura: There are small bilateral pleural effusions. Numerous calcified granulomata are identified throughout the lungs. No suspicious noncalcified mass is identified. There is minimal atelectasis at the lung bases, RIGHT greater than LEFT. Upper Abdomen: Status post cholecystectomy. Calcified granulomata identified within the liver and spleen. Musculoskeletal: Median sternotomy. Remote wedge compression fracture of T5. Review of the MIP images confirms the above findings. IMPRESSION: 1. Technically adequate exam showing no acute pulmonary embolus. 2. Cardiomegaly, coronary artery disease, and prior CABG. 3. Small bilateral pleural effusions and minimal atelectasis at the lung bases, RIGHT greater than LEFT. 4. Evidence of prior granulomatous disease. 5. Small hiatal hernia. 6. Remote wedge compression fracture of T5. 7. Aortic Atherosclerosis (ICD10-I70.0). Electronically Signed   By: Norva Pavlov M.D.   On: 03/14/2020 14:10    Cardiac Studies   Echo EF normal with mild aortic stenosis.  01/21/2020.  Patient Profile     74 y.o. female here with high degree AV block, mild to moderate aortic stenosis  Assessment & Plan    High degree AV block/2-1 AV block/transient third-degree AV block -EP has been consulted.  Nada Boozer, NP has discussed with Dr. Graciela Husbands.  Plan is for pacemaker tomorrow given overall stability.  No pauses on telemetry, heart rate remains consistent 45.  Obviously if any deterioration occurs, temporary pacemaker wire will be necessary. -Increase symptoms of shortness of breath when walking on level ground.  CAD/CABG -Previously beta-blocker was stopped because of perceived symptoms by patient.  Troponins were mildly elevated  nonspecific.  Dr. Antoine Poche suggested cardiac catheterization right and left on Monday.  Heparin IV was started.  COPD/asthma/diastolic dysfunction -BNP mildly elevated previously.  Will hold p.o. Lasix.  Acute kidney injury -Creatinine in February 1.28, 1.49 on admission, 1.63 currently.  Possibly secondary to decreased cardiac output as well as recent contrast for negative CT PE.  -Back on her p.o. Lasix 20 mg low-dose--will hold prior to cath and start gentle hydration at 8pm.  Did receive 1 dose of IV Lasix previously.  Lets continue to hold her lisinopril.  Chronic kidney disease stage IIIa -Continue to monitor.  Lets continue to hold her lisinopril.  Mild aortic stenosis -Has been recently described as mild to possibly moderate.  Following clinically.        For questions or updates, please contact [facility] Please consult [email or web address] for contact info under        Signed, Donato Schultz, MD  03/15/2020, 11:35 AM

## 2020-03-15 NOTE — Progress Notes (Signed)
Spoke with on call cardiologists about patients heart rhythm. Agree to keep NPO in case of possible pacemaker procedure tomorrow.

## 2020-03-15 NOTE — Consult Note (Addendum)
ELECTROPHYSIOLOGY CONSULT NOTE  Patient ID: Samantha Short, MRN: [id number], DOB/AGE: 1945/11/18 74 y.o. Admit date: 03/14/2020 Date of Consult: 03/15/2020  Primary Physician: Mila PalmerWolters, Sharon, MD Primary Cardiologist: CMAc Amori R Zoila ShutterKestner is a 74 y.o. female who is being seen today for the evaluation of heart block at the request of Dr Anne FuSkains.   Chief Complaint: Shortness of breath/dyspnea on exertion   HPI Diara R Zoila ShutterKestner is a 74 y.o. female seen for high-grade second-degree heart block.  Admitted with progressive dyspnea 2-3 m now at less than 20 feet.  No associated chest discomfort.  Some degree of peripheral edema.  No abdominal distention.  No nocturnal dyspnea or orthopnea.  No palpitations.   She relates it to the treatment of her hyperthyroidism; she is now euthyroid  Interestingly, her heart rate when seen by Dr. Everardo AllEllison in the middle of May (halfway through this progression) was recorded as 101.    Hx of CAD with prior CABG x 1  2010;    DATE TEST EF   10/13 LHC  LIMA-LAD patent  10/17 Echo   60-65 % AS mild Mean Grad 10  5/21 Echo   60-65 % AS mild Mean Grad 12        Date Cr K Hgb  9/19 1.57 4.7 12.4  2/21 1.28 4.5    7/21 1.63 4.0 11.5       Past Medical History:  Diagnosis Date  . Asthma   . Chronic diastolic CHF (congestive heart failure) (HCC)    a. 02/2011 Echo: EF 55-60%, Gr2 DD, Mild MR  . Coronary artery disease    a. s/p CABG x 1 2010:  LIMA->LAD.;  b. amdx for CP => LHC 07/04/12: LAD 70-80%, mid RCA 30%, LIMA-LAD patent with competitive flow limiting distal LAD filling, EF 70% with hyperdynamic LV function. Medical therapy continued.  . Dyslipidemia   . GERD (gastroesophageal reflux disease)   . Hyperlipidemia   . Hypertension   . Hyperthyroidism   . Mitral regurgitation    a. mild by echo 02/2011.      Surgical History:  Past Surgical History:  Procedure Laterality Date  . ABDOMINAL HYSTERECTOMY  1980  . CARDIAC CATHETERIZATION   07/27/09 & 07/28/09  . CESAREAN SECTION    . CORONARY ARTERY BYPASS GRAFT  08/03/2009   x1 using left internal mammary artery to distal left anterior  descending coronary artery.   Marland Kitchen. GALLBLADDER SURGERY  2001  . LEFT HEART CATHETERIZATION WITH CORONARY/GRAFT ANGIOGRAM N/A 07/05/2012   Procedure: LEFT HEART CATHETERIZATION WITH Isabel CapriceORONARY/GRAFT ANGIOGRAM;  Surgeon: Wendall StadePeter C Nishan, MD;  [facility];  Service: Cardiovascular;  Laterality: N/A;  . SHOULDER SURGERY  1998 / 2001   from accident  . VESICOVAGINAL FISTULA CLOSURE W/ TAH  1998     Home Meds: Prior to Admission medications   Medication Sig Start Date End Date Taking? Authorizing Provider  acetaminophen (TYLENOL) 325 MG tablet Take 325 mg by mouth every 6 (six) hours as needed for headache (pain).    Yes [provider]  albuterol (VENTOLIN HFA) 108 (90 Base) MCG/ACT inhaler Inhale 2 puffs into the lungs every 4 (four) hours as needed for wheezing or shortness of breath (asthma).  03/12/20  Yes [provider]  aspirin EC 81 MG tablet Take 81 mg by mouth at bedtime. Swallow whole.   Yes [provider]  Cholecalciferol (VITAMIN D) 50 MCG (2000 UT) CAPS Take 2,000 Units by mouth daily.  Yes [provider]  furosemide (LASIX) 20 MG tablet Take 20 mg by mouth daily.    Yes [provider]  furosemide (LASIX) 40 MG tablet Take 40 mg by mouth daily as needed for fluid or edema (leg swelling).    Yes [provider]  Investigational - Study Medication Take 1 tablet by mouth daily. Unnamed study/Medication Name/alternative to statins: Bempedoic Acid 180 mg vs placebo Managed by Dr. Jens Som. Start date March 2019/ original term 3 years - extended indefinitely   Yes [provider]  levothyroxine (SYNTHROID) 25 MCG tablet Take 1 tablet (25 mcg total) by mouth daily before breakfast. Patient taking differently: Take 12.5 mcg by mouth daily before breakfast.  01/26/20  Yes  Romero Belling, MD  lisinopril (ZESTRIL) 40 MG tablet Take 1 tablet (40 mg total) by mouth daily. Patient taking differently: Take 40 mg by mouth at bedtime.  01/08/20  Yes Kathleene Hazel, MD  Multiple Vitamin (MULTIVITAMIN WITH MINERALS) TABS tablet Take 1 tablet by mouth daily.   Yes [provider]  Multiple Vitamins-Minerals (PRESERVISION AREDS 2) CAPS Take 1 capsule by mouth 2 (two) times daily.   Yes [provider]  omeprazole (PRILOSEC OTC) 20 MG tablet Take 20 mg by mouth daily.   Yes [provider]  OVER THE COUNTER MEDICATION Take 1 tablet by mouth daily as needed (allergies). OTC allergy medication   Yes [provider]  predniSONE (DELTASONE) 10 MG tablet Take 10-40 mg by mouth See admin instructions. Tapered course started 03/12/2020: take 4 tablets (40 mg) by mouth daily for 2 days, then take 3 tablets (30 mg) daily for 2 days, then take 2 tablets (20 mg) daily for 2 days, then take 1 tablet (10 mg) daily for 2 days 03/12/20  Yes [provider]    Inpatient Medications:  . aspirin EC  81 mg Oral QHS  . cholecalciferol  2,000 Units Oral Daily  . furosemide  20 mg Oral Daily  . levothyroxine  12.5 mcg Oral Q0600  . multivitamin with minerals  1 tablet Oral Daily  . pantoprazole  40 mg Oral Daily      Allergies:  Allergies  Allergen Reactions  . Ceclor [Cefaclor] Other (See Comments)    Lost vision in eye  . Hctz [Hydrochlorothiazide] Other (See Comments)    Renal insufficiency  . Lipitor [Atorvastatin Calcium] Other (See Comments)    Increased A1C  . Norvasc [Amlodipine] Other (See Comments)    Makes patient stiff  . Penicillins Hives, Shortness Of Breath, Itching and Rash    Has patient had a PCN reaction causing immediate rash, facial/tongue/throat swelling, SOB or lightheadedness with hypotension: Yes Has patient had a PCN reaction causing severe rash involving mucus membranes or skin necrosis: Unk Has patient had a  PCN reaction that required hospitalization: No Has patient had a PCN reaction occurring within the last 10 years: No If all of the above answers are "NO", then may proceed with Cephalosporin use.   . Sulfa Antibiotics Other (See Comments)    CAUSES SHOCK  . Statins Other (See Comments)    MYALGIAS AND WEAKNESS  . Erythromycin Rash  . Latex Rash  . Levofloxacin Rash  . Tetracyclines & Related Rash    Social History   Socioeconomic History  . Marital status: Widowed    Spouse name: Not on file  . Number of children: 3  . Years of education: Not on file  . Highest education level: Not on  file  Occupational History  . Occupation: Retired- Engineer, site  Tobacco Use  . Smoking status: Former Smoker    Years: 7.00    Types: Cigarettes    Quit date: 07/12/2010    Years since quitting: 9.6  . Smokeless tobacco: Never Used  Vaping Use  . Vaping Use: Never used  Substance and Sexual Activity  . Alcohol use: No  . Drug use: No  . Sexual activity: Not on file  Other Topics Concern  . Not on file  Social History Narrative  . Not on file   Social Determinants of Health   Financial Resource Strain:   . Difficulty of Paying Living Expenses:   Food Insecurity:   . Worried About Programme researcher, broadcasting/film/video in the Last Year:   . Barista in the Last Year:   Transportation Needs:   . Freight forwarder (Medical):   Marland Kitchen Lack of Transportation (Non-Medical):   Physical Activity:   . Days of Exercise per Week:   . Minutes of Exercise per Session:   Stress:   . Feeling of Stress :   Social Connections:   . Frequency of Communication with Friends and Family:   . Frequency of Social Gatherings with Friends and Family:   . Attends Religious Services:   . Active Member of Clubs or Organizations:   . Attends Banker Meetings:   Marland Kitchen Marital Status:   Intimate Partner Violence:   . Fear of Current or Ex-Partner:   . Emotionally Abused:   Marland Kitchen Physically Abused:   .  Sexually Abused:      Family History  Problem Relation Age of Onset  . Cancer Father   . Heart attack Father   . High blood pressure Father   . High Cholesterol Father   . Heart disease Father   . Alcoholism Father   . Obesity Father   . Heart attack Mother   . Heart disease Mother        had pacemaker  . High blood pressure Mother   . High Cholesterol Mother   . Obesity Mother   . Cancer Brother   . Heart disease Brother   . Heart disease Sister   . Cancer Sister   . Angina Sister   . Coronary artery disease Son   . Hypertension Sister   . Hypertension Brother   . Thyroid disease Neg Hx      ROS:  Please see the history of present illness.     All other systems reviewed and negative.    Physical Exam: Blood pressure 130/66, pulse (!) 46, temperature 98.1 F (36.7 C), temperature source Oral, resp. rate 20, height 5\' 2"  (1.575 m), weight 98.6 kg, SpO2 95 %. General: Well developed, well nourished female in no acute distress. Head: Normocephalic, atraumatic, sclera non-icteric, no xanthomas, nares are without discharge. EENT: normal Lymph Nodes:  none Back: without scoliosis/kyphosis, no CVA tendersness Neck: Negative for carotid bruits. JVD flat Lungs: Clear bilaterally to auscultation without wheezes, rales, or rhonchi. Breathing is unlabored. Heart: slow and irregular RR with S1 S2. No murmur , rubs, or gallops appreciated. Abdomen: Soft, non-tender, non-distended with normoactive bowel sounds. No hepatomegaly. No rebound/guarding. No obvious abdominal masses. Msk:  Strength and tone appear normal for age. Extremities: No clubbing or cyanosis. No edema.  Distal pedal pulses are 2+ and equal bilaterally. Skin: Warm and Dry Neuro: Alert and oriented X 3. CN III-XII intact Grossly normal sensory and motor function .  Psych:  Responds to questions appropriately with a normal affect.      Labs: Cardiac Enzymes No results for input(s): CKTOTAL, CKMB, TROPONINI in the  last 72 hours. CBC Lab Results  Component Value Date   WBC 13.5 (H) 03/15/2020   HGB 11.5 (L) 03/15/2020   HCT 35.9 (L) 03/15/2020   MCV 94.2 03/15/2020   PLT 248 03/15/2020   PROTIME: No results for input(s): LABPROT, INR in the last 72 hours. Chemistry  Recent Labs  Lab 03/14/20 1123 03/14/20 1123 03/15/20 0154  NA 143   < > 139  K 4.4   < > 4.0  CL 112*   < > 109  CO2 18*   < > 21*  BUN 37*   < > 32*  CREATININE 1.49*   < > 1.63*  CALCIUM 9.3   < > 8.7*  PROT 6.8  --   --   BILITOT 1.0  --   --   ALKPHOS 66  --   --   ALT 35  --   --   AST 44*  --   --   GLUCOSE 100*   < > 91   < > = values in this interval not displayed.   Lipids Lab Results  Component Value Date   CHOL 141 05/06/2018   HDL 50 05/06/2018   LDLCALC 77 05/06/2018   TRIG 69 05/06/2018   BNP Pro B Natriuretic peptide (BNP)  Date/Time Value Ref Range Status  07/04/2012 02:16 PM 1,172.0 (H) 0 - 125 pg/mL Final   Thyroid Function Tests: Recent Labs    03/14/20 1946  TSH 5.790*      Miscellaneous Lab Results  Component Value Date   DDIMER 0.61 (H) 07/04/2012    Radiology/Studies:  DG Chest 2 View  Result Date: 03/14/2020 CLINICAL DATA:  Shortness of breath, chest pain EXAM: CHEST - 2 VIEW COMPARISON:  05/23/2018 FINDINGS: Status post median sternotomy. Multiple benign calcified nodules are present bilaterally. No acute appearing airspace opacity. The visualized skeletal structures are unremarkable. IMPRESSION: 1. No acute abnormality of the lungs. 2. Multiple benign calcified nodules bilaterally. Electronically Signed   By: Lauralyn Primes M.D.   On: 03/14/2020 11:41   CT Angio Chest PE W and/or Wo Contrast  Result Date: 03/14/2020 CLINICAL DATA:  Shortness of breath and chest pain worsening over the last 2 months. Abdominal distention. EXAM: CT ANGIOGRAPHY CHEST WITH CONTRAST TECHNIQUE: Multidetector CT imaging of the chest was performed using the standard protocol during bolus  administration of intravenous contrast. Multiplanar CT image reconstructions and MIPs were obtained to evaluate the vascular anatomy. CONTRAST:  10mL OMNIPAQUE IOHEXOL 350 MG/ML SOLN COMPARISON:  Chest x-ray on 03/14/2020 FINDINGS: Cardiovascular: Heart is enlarged. There is focal atherosclerotic calcification of the coronary arteries. Median sternotomy and CABG. Pulmonary arteries are well opacified and there is no acute pulmonary embolus. Mediastinum/Nodes: The visualized portion of the thyroid gland has a normal appearance. Small hiatal hernia. Otherwise the esophagus is normal. There are multiple calcified mediastinal and hilar lymph nodes. No suspicious lymphadenopathy. Lungs/Pleura: There are small bilateral pleural effusions. Numerous calcified granulomata are identified throughout the lungs. No suspicious noncalcified mass is identified. There is minimal atelectasis at the lung bases, RIGHT greater than LEFT. Upper Abdomen: Status post cholecystectomy. Calcified granulomata identified within the liver and spleen. Musculoskeletal: Median sternotomy. Remote wedge compression fracture of T5. Review of the MIP images confirms the above findings. IMPRESSION: 1. Technically adequate exam showing no acute pulmonary embolus. 2.  Cardiomegaly, coronary artery disease, and prior CABG. 3. Small bilateral pleural effusions and minimal atelectasis at the lung bases, RIGHT greater than LEFT. 4. Evidence of prior granulomatous disease. 5. Small hiatal hernia. 6. Remote wedge compression fracture of T5. 7. Aortic Atherosclerosis (ICD10-I70.0). Electronically Signed   By: Norva Pavlov M.D.   On: 03/14/2020 14:10    EKG # 1  7/21 sinus with 2:1 heart block and RBBB  ECG #2 7/21 sinus with heart block, approx 2:1 but isorhythmic junctional escape  ECG 4/21 Sinus w RBBB  Tele suggests now LBBB escape   ECG 7/05 1234 sinus with high-grade heart block.  Conducted beats are with a more typical right bundle branch block  pattern; escape beats at a different right bundle branch block pattern QRS duration of the escape beats is relatively narrow suggesting a junctional origin  Assessment and Plan:   Heart block-second-degree  Dyspnea on exertion-progressive  Coronary artery disease-single-vessel CABG  Obesity  Aortic stenosis-mild  Renal insufficiency Estimated Creatinine Clearance: 33.7 mL/min (A) (by C-G formula based on SCr of 1.63 mg/dL (H)). Grade 3   The patient has dyspnea on exertion and is found to have second-degree heart block on admission.  The presumption has been that they are related.  However, this hypothesis is challenged by her presentation to Dr. Clifton James in April for which her symptoms have begun with an ECG demonstrated sinus rhythm and a visit in May with Dr. Everardo All where vital signs noted a heart rate of over the 100.  This suggests that either the heart block is intermittent or that the heart block is not solely the problem.  Her heart block is second-degree.  There is intermittent conduction and a competing junctional escape.  The difference in morphology of the nonconducted beats suggests some higher degree block and simply Mobitz 1; still, the QRSs are relatively narrow suggesting a junctional escape.  In this regard, the fact that she has known coronary disease raises the possibility that some of this is an anginal equivalent and that her conduction system disease relates to AV nodal ischemia.  Hence, would recommend catheterization prior to pursuing pacemaker implantation.  I reviewed this with her and will plan to review with Dr[facility] in the morning.  In this regard it is noteworthy that she had no angina prior to being found to have severe high-grade surgical LAD disease.  Amyloid can also be associated with heart block and particularly in patients with aortic stenosis.   cMRI would be very useful if catheterization is negative.  Pacing thereafter would be appropriate              Sherryl Manges

## 2020-03-15 NOTE — Progress Notes (Signed)
ANTICOAGULATION CONSULT NOTE   Pharmacy Consult for Heparin Indication: Unstable Angina  Allergies  Allergen Reactions  . Ceclor [Cefaclor] Other (See Comments)    Lost vision in eye  . Hctz [Hydrochlorothiazide] Other (See Comments)    Renal insufficiency  . Lipitor [Atorvastatin Calcium] Other (See Comments)    Increased A1C  . Norvasc [Amlodipine] Other (See Comments)    Makes patient stiff  . Penicillins Hives, Shortness Of Breath, Itching and Rash    Has patient had a PCN reaction causing immediate rash, facial/tongue/throat swelling, SOB or lightheadedness with hypotension: Yes Has patient had a PCN reaction causing severe rash involving mucus membranes or skin necrosis: Unk Has patient had a PCN reaction that required hospitalization: No Has patient had a PCN reaction occurring within the last 10 years: No If all of the above answers are "NO", then may proceed with Cephalosporin use.   . Sulfa Antibiotics Other (See Comments)    CAUSES SHOCK  . Statins Other (See Comments)    MYALGIAS AND WEAKNESS  . Erythromycin Rash  . Latex Rash  . Levofloxacin Rash  . Tetracyclines & Related Rash    Patient Measurements: Height: 5\' 2"  (157.5 cm) Weight: 99.9 kg (220 lb 3.2 oz) IBW/kg (Calculated) : 50.1 Heparin Dosing Weight: 74 kg  Vital Signs: Temp: 97.9 F (36.6 C) (07/05 0029) Temp Source: Oral (07/05 0029) BP: 106/43 (07/05 0029) Pulse Rate: 48 (07/05 0029)  Labs: Recent Labs    03/14/20 1123 03/14/20 1314 03/14/20 1946 03/15/20 0154  HGB 12.6  --   --  11.5*  HCT 40.5  --   --  35.9*  PLT 292  --   --  248  HEPARINUNFRC  --   --   --  0.35  CREATININE 1.49*  --   --  1.63*  TROPONINIHS 43* 58* 57*  --     Estimated Creatinine Clearance: 34 mL/min (A) (by C-G formula based on SCr of 1.63 mg/dL (H)).   Medical History: Past Medical History:  Diagnosis Date  . Asthma   . Chronic diastolic CHF (congestive heart failure) (HCC)    a. 02/2011 Echo: EF  55-60%, Gr2 DD, Mild MR  . Coronary artery disease    a. s/p CABG x 1 2010:  LIMA->LAD.;  b. amdx for CP => LHC 07/04/12: LAD 70-80%, mid RCA 30%, LIMA-LAD patent with competitive flow limiting distal LAD filling, EF 70% with hyperdynamic LV function. Medical therapy continued.  . Dyslipidemia   . GERD (gastroesophageal reflux disease)   . Hyperlipidemia   . Hypertension   . Hyperthyroidism   . Mitral regurgitation    a. mild by echo 02/2011.    Assessment: 74 yo W with unstable angina in the setting of arrhythmia starting on heparin. No AC PTA. H/H, plt stable, overall low bleed risk.   7/5 AM update:  Initial heparin level therapeutic    Goal of Therapy:  Heparin level 0.3-0.7 units/ml Monitor platelets by anticoagulation protocol: Yes   Plan:  Cont heparin at 900 units/hr>>>1000 heparin level Monitor daily HL, CBC/plt Monitor for signs/symptoms of bleeding   9/5, PharmD, BCPS Clinical Pharmacist Phone: [phone]

## 2020-03-16 ENCOUNTER — Encounter (HOSPITAL_COMMUNITY)
Admission: EM | Disposition: A | Discharge status: Home / Self Care | Payer: Self-pay | Source: Home / Self Care | Attending: Internal Medicine

## 2020-03-16 DIAGNOSIS — I350 Nonrheumatic aortic (valve) stenosis: Secondary | ICD-10-CM

## 2020-03-16 DIAGNOSIS — I5032 Chronic diastolic (congestive) heart failure: Secondary | ICD-10-CM

## 2020-03-16 DIAGNOSIS — I442 Atrioventricular block, complete: Principal | ICD-10-CM

## 2020-03-16 DIAGNOSIS — N170 Acute kidney failure with tubular necrosis: Secondary | ICD-10-CM

## 2020-03-16 DIAGNOSIS — I10 Essential (primary) hypertension: Secondary | ICD-10-CM

## 2020-03-16 DIAGNOSIS — N183 Chronic kidney disease, stage 3 unspecified: Secondary | ICD-10-CM

## 2020-03-16 LAB — HEPARIN LEVEL (UNFRACTIONATED): Heparin Unfractionated: 0.42 IU/mL (ref 0.30–0.70)

## 2020-03-16 LAB — CBC
HCT: 37 % (ref 36.0–46.0)
Hemoglobin: 12.1 g/dL (ref 12.0–15.0)
MCH: 31 pg (ref 26.0–34.0)
MCHC: 32.7 g/dL (ref 30.0–36.0)
MCV: 94.9 fL (ref 80.0–100.0)
Platelets: 250 10*3/uL (ref 150–400)
RBC: 3.9 MIL/uL (ref 3.87–5.11)
RDW: 13.1 % (ref 11.5–15.5)
WBC: 11.6 10*3/uL — ABNORMAL HIGH (ref 4.0–10.5)
nRBC: 0 % (ref 0.0–0.2)

## 2020-03-16 LAB — BASIC METABOLIC PANEL
Anion gap: 10 (ref 5–15)
BUN: 38 mg/dL — ABNORMAL HIGH (ref 8–23)
CO2: 23 mmol/L (ref 22–32)
Calcium: 9 mg/dL (ref 8.9–10.3)
Chloride: 107 mmol/L (ref 98–111)
Creatinine, Ser: 1.84 mg/dL — ABNORMAL HIGH (ref 0.44–1.00)
GFR calc Af Amer: 31 mL/min — ABNORMAL LOW (ref >60)
GFR calc non Af Amer: 27 mL/min — ABNORMAL LOW (ref >60)
Glucose, Bld: 104 mg/dL — ABNORMAL HIGH (ref 70–99)
Potassium: 4 mmol/L (ref 3.5–5.1)
Sodium: 140 mmol/L (ref 135–145)

## 2020-03-16 LAB — LIPID PANEL
Cholesterol: 148 mg/dL (ref 0–200)
HDL: 39 mg/dL — ABNORMAL LOW (ref >40)
LDL Cholesterol: 85 mg/dL (ref 0–99)
Total CHOL/HDL Ratio: 3.8 RATIO
Triglycerides: 121 mg/dL (ref <150)
VLDL: 24 mg/dL (ref 0–40)

## 2020-03-16 LAB — PROTIME-INR
INR: 1.3 — ABNORMAL HIGH (ref 0.8–1.2)
Prothrombin Time: 15.2 seconds (ref 11.4–15.2)

## 2020-03-16 SURGERY — PACEMAKER IMPLANT
Anesthesia: LOCAL

## 2020-03-16 MED ORDER — ASPIRIN 81 MG PO CHEW: 81.0000 mg | CHEWABLE_TABLET | ORAL | Status: DC

## 2020-03-16 MED ORDER — SODIUM CHLORIDE 0.9 % IV SOLN: 250.0000 mL | INTRAVENOUS | Status: DC | PRN

## 2020-03-16 MED ORDER — ASPIRIN 81 MG PO CHEW: 81.0000 mg | CHEWABLE_TABLET | ORAL | Status: AC

## 2020-03-16 MED ORDER — SODIUM CHLORIDE 0.9% FLUSH: 3.0000 mL | INTRAVENOUS | Status: DC | PRN

## 2020-03-16 MED ORDER — SODIUM CHLORIDE 0.9 % WEIGHT BASED INFUSION
1.0000 mL/kg/h | INTRAVENOUS | Status: DC
  Administered 2020-03-16: 1 mL/kg/h via INTRAVENOUS

## 2020-03-16 MED ORDER — SODIUM CHLORIDE 0.9% FLUSH
3.0000 mL | Freq: Two times a day (BID) | INTRAVENOUS | Status: DC
  Administered 2020-03-16: 3 mL via INTRAVENOUS

## 2020-03-16 MED ORDER — ASPIRIN 81 MG PO CHEW
81.0000 mg | CHEWABLE_TABLET | ORAL | Status: AC
  Administered 2020-03-17: 81 mg via ORAL
  Filled 2020-03-16: qty 1

## 2020-03-16 NOTE — Progress Notes (Signed)
Progress Note  Patient Name: Samantha Short Date of Encounter: 03/16/2020  [facility] Cardiologist: Verne Carrow, MD   Subjective   She generally feels better. Still has some SOB. No chest pain. States this has been going on for 2 months and worse in the past 2 weeks.   Inpatient Medications    Scheduled Meds:  aspirin  81 mg Oral Pre-Cath   aspirin EC  81 mg Oral QHS   cholecalciferol  2,000 Units Oral Daily   levothyroxine  12.5 mcg Oral Q0600   multivitamin with minerals  1 tablet Oral Daily   pantoprazole  40 mg Oral Daily   sodium chloride flush  3 mL Intravenous Q12H   Continuous Infusions:  sodium chloride     sodium chloride 1 mL/kg/hr (03/16/20 0508)   heparin 900 Units/hr (03/15/20 1934)   PRN Meds: sodium chloride, acetaminophen **OR** acetaminophen, albuterol, ondansetron **OR** ondansetron (ZOFRAN) IV, sodium chloride flush   Vital Signs    Vitals:   03/15/20 0348 03/15/20 1152 03/15/20 1932 03/16/20 0343  BP: 130/66 136/61 (!) 128/47 (!) 134/56  Pulse: (!) 46 (!) 47 (!) 46 (!) 49  Resp: 20 16 18 18   Temp: 98.1 F (36.7 C) 97.8 F (36.6 C) 97.7 F (36.5 C) (!) 97.5 F (36.4 C)  TempSrc: Oral  Oral Oral  SpO2: 95% 100% 100% 100%  Weight: 98.6 kg   98.7 kg  Height:        Intake/Output Summary (Last 24 hours) at 03/16/2020 0905 Last data filed at 03/16/2020 0600 Gross per 24 hour  Intake 651.74 ml  Output 202 ml  Net 449.74 ml   Last 3 Weights 03/16/2020 03/15/2020 03/14/2020  Weight (lbs) 217 lb 8 oz 217 lb 4.8 oz 220 lb 3.2 oz  Weight (kg) 98.657 kg 98.567 kg 99.882 kg      Telemetry    2-1 AV block heart rate 49 bpm, brief transient third-degree AV block- Personally Reviewed  ECG    2-1 AV block- Personally Reviewed  Physical Exam   GEN: No acute distress.   Neck: No JVD Cardiac: Bradycardic regular, 2/6 SM, no rubs, or gallops.  Respiratory: Clear to auscultation bilaterally. GI: Soft, nontender, non-distended   MS: No edema; No deformity. Neuro:  Nonfocal  Psych: Normal affect   Labs    High Sensitivity Troponin:   Recent Labs  Lab 03/14/20 1123 03/14/20 1314 03/14/20 1946  TROPONINIHS 43* 58* 57*      Chemistry Recent Labs  Lab 03/14/20 1123 03/15/20 0154 03/16/20 0433  NA 143 139 140  K 4.4 4.0 4.0  CL 112* 109 107  CO2 18* 21* 23  GLUCOSE 100* 91 104*  BUN 37* 32* 38*  CREATININE 1.49* 1.63* 1.84*  CALCIUM 9.3 8.7* 9.0  PROT 6.8  --   --   ALBUMIN 3.8  --   --   AST 44*  --   --   ALT 35  --   --   ALKPHOS 66  --   --   BILITOT 1.0  --   --   GFRNONAA 34* 31* 27*  GFRAA 40* 36* 31*  ANIONGAP 13 9 10      Hematology Recent Labs  Lab 03/14/20 1123 03/15/20 0154 03/16/20 0433  WBC 21.1* 13.5* 11.6*  RBC 4.21 3.81* 3.90  HGB 12.6 11.5* 12.1  HCT 40.5 35.9* 37.0  MCV 96.2 94.2 94.9  MCH 29.9 30.2 31.0  MCHC 31.1 32.0 32.7  RDW 13.2 13.2  13.1  PLT 292 248 250    BNP Recent Labs  Lab 03/14/20 1123 03/15/20 0154  BNP 883.2* 563.7*     DDimer No results for input(s): DDIMER in the last 168 hours.   Radiology    DG Chest 2 View  Result Date: 03/14/2020 CLINICAL DATA:  Shortness of breath, chest pain EXAM: CHEST - 2 VIEW COMPARISON:  05/23/2018 FINDINGS: Status post median sternotomy. Multiple benign calcified nodules are present bilaterally. No acute appearing airspace opacity. The visualized skeletal structures are unremarkable. IMPRESSION: 1. No acute abnormality of the lungs. 2. Multiple benign calcified nodules bilaterally. Electronically Signed   By: Lauralyn Primes M.D.   On: 03/14/2020 11:41   CT Angio Chest PE W and/or Wo Contrast  Result Date: 03/14/2020 CLINICAL DATA:  Shortness of breath and chest pain worsening over the last 2 months. Abdominal distention. EXAM: CT ANGIOGRAPHY CHEST WITH CONTRAST TECHNIQUE: Multidetector CT imaging of the chest was performed using the standard protocol during bolus administration of intravenous contrast.  Multiplanar CT image reconstructions and MIPs were obtained to evaluate the vascular anatomy. CONTRAST:  74mL OMNIPAQUE IOHEXOL 350 MG/ML SOLN COMPARISON:  Chest x-ray on 03/14/2020 FINDINGS: Cardiovascular: Heart is enlarged. There is focal atherosclerotic calcification of the coronary arteries. Median sternotomy and CABG. Pulmonary arteries are well opacified and there is no acute pulmonary embolus. Mediastinum/Nodes: The visualized portion of the thyroid gland has a normal appearance. Small hiatal hernia. Otherwise the esophagus is normal. There are multiple calcified mediastinal and hilar lymph nodes. No suspicious lymphadenopathy. Lungs/Pleura: There are small bilateral pleural effusions. Numerous calcified granulomata are identified throughout the lungs. No suspicious noncalcified mass is identified. There is minimal atelectasis at the lung bases, RIGHT greater than LEFT. Upper Abdomen: Status post cholecystectomy. Calcified granulomata identified within the liver and spleen. Musculoskeletal: Median sternotomy. Remote wedge compression fracture of T5. Review of the MIP images confirms the above findings. IMPRESSION: 1. Technically adequate exam showing no acute pulmonary embolus. 2. Cardiomegaly, coronary artery disease, and prior CABG. 3. Small bilateral pleural effusions and minimal atelectasis at the lung bases, RIGHT greater than LEFT. 4. Evidence of prior granulomatous disease. 5. Small hiatal hernia. 6. Remote wedge compression fracture of T5. 7. Aortic Atherosclerosis (ICD10-I70.0). Electronically Signed   By: Norva Pavlov M.D.   On: 03/14/2020 14:10    Cardiac Studies   Echo EF normal with mild aortic stenosis.  01/21/2020.  Patient Profile     74 y.o. female here with high degree AV block, mild to moderate aortic stenosis  Assessment & Plan    1. High degree AV block/2-1 AV block/transient third-degree AV block -EP has been consulted. Appreciate Dr Odessa Fleming input. Plan [facility] with graft  prior to considering for pacemaker. Also with LVH on Echo may want to consider cardiac MRI. Unfortunately both require IV contrast and with rising creatinine will need to hold off until creatinine stable.  -Increase symptoms of shortness of breath when walking on level ground.  2. CAD/CABG x 1 with LIMA to the LAD -Previously beta-blocker was stopped because of perceived symptoms by patient.  Troponins were mildly elevated nonspecific.  Recommend  cardiac catheterization right and left on Wednesday pending renal function.  Heparin IV was started. Hydrate.   3. COPD/asthma/diastolic dysfunction -BNP mildly elevated previously.  Will hold p.o. Lasix. - patient received shot of steroids and Medrol dose pack prior to admission which explains elevated WBC- now improved from 21K to 11K.   4. Acute kidney injury on top of  CKD stage 3.  -Creatinine in February 1.28, 1.49 on admission, then 1.63 and today 1.84 currently.  Possibly secondary to decreased cardiac output, diuretics, as well as recent contrast for negative CT PE.  -will hold lasix and  Lisinopril. - repeat BMET in am - hydrate. If renal function improved can at least proceed with cath tomorrow.  5. Chronic kidney disease stage IIIa -Continue to monitor.  Lets continue to hold her lisinopril.  6. Mild aortic stenosis -Has been recently described as mild to possibly moderate.  Following clinically.        For questions or updates, please contact [facility] Please consult [email or web address] for contact info under        Signed, Johnika Escareno [place], MD  03/16/2020, 9:05 AM

## 2020-03-16 NOTE — Progress Notes (Addendum)
Electrophysiology Rounding Note  Patient Name: Samantha Short Date of Encounter: 03/16/2020  Primary Cardiologist: Verne Carrow, MD Electrophysiologist: New  Subjective   The patient is doing well today. She was able to get up and go to the restroom and walk around the room, and take a quick shower without any lightheadedness or dizziness.   Cath deferred with elevated renal function.   Inpatient Medications    Scheduled Meds: . aspirin  81 mg Oral Pre-Cath  . aspirin EC  81 mg Oral QHS  . cholecalciferol  2,000 Units Oral Daily  . levothyroxine  12.5 mcg Oral Q0600  . multivitamin with minerals  1 tablet Oral Daily  . pantoprazole  40 mg Oral Daily  . sodium chloride flush  3 mL Intravenous Q12H  . sodium chloride flush  3 mL Intravenous Q12H   Continuous Infusions: . sodium chloride    . heparin 900 Units/hr (03/15/20 1934)   PRN Meds: sodium chloride, acetaminophen **OR** acetaminophen, albuterol, ondansetron **OR** ondansetron (ZOFRAN) IV, sodium chloride flush   Vital Signs    Vitals:   03/15/20 1152 03/15/20 1932 03/16/20 0343 03/16/20 1052  BP: 136/61 (!) 128/47 (!) 134/56 128/61  Pulse: (!) 47 (!) 46 (!) 49 (!) 45  Resp: 16 18 18    Temp: 97.8 F (36.6 C) 97.7 F (36.5 C) (!) 97.5 F (36.4 C) 98.2 F (36.8 C)  TempSrc:  Oral Oral Oral  SpO2: 100% 100% 100% 98%  Weight:   98.7 kg   Height:        Intake/Output Summary (Last 24 hours) at 03/16/2020 1236 Last data filed at 03/16/2020 0600 Gross per 24 hour  Intake 651.74 ml  Output 202 ml  Net 449.74 ml   Filed Weights   03/14/20 1705 03/15/20 0348 03/16/20 0343  Weight: 99.9 kg 98.6 kg 98.7 kg    Physical Exam    GEN- The patient is well appearing, alert and oriented x 3 today.   Head- normocephalic, atraumatic Eyes-  Sclera clear, conjunctiva pink Ears- hearing intact Oropharynx- clear Neck- supple Lungs- Clear to ausculation bilaterally, normal work of breathing Heart- Regular rate  and rhythm, no murmurs, rubs or gallops GI- soft, NT, ND, + BS Extremities- no clubbing, cyanosis, or edema Skin- no rash or lesion Psych- euthymic mood, full affect Neuro- strength and sensation are intact  Labs    CBC Recent Labs    03/14/20 1123 03/14/20 1123 03/15/20 0154 03/16/20 0433  WBC 21.1*   < > 13.5* 11.6*  NEUTROABS 16.9*  --   --   --   HGB 12.6   < > 11.5* 12.1  HCT 40.5   < > 35.9* 37.0  MCV 96.2   < > 94.2 94.9  PLT 292   < > 248 250   < > = values in this interval not displayed.   Basic Metabolic Panel Recent Labs    05/17/20 0154 03/16/20 0433  NA 139 140  K 4.0 4.0  CL 109 107  CO2 21* 23  GLUCOSE 91 104*  BUN 32* 38*  CREATININE 1.63* 1.84*  CALCIUM 8.7* 9.0   Liver Function Tests Recent Labs    03/14/20 1123  AST 44*  ALT 35  ALKPHOS 66  BILITOT 1.0  PROT 6.8  ALBUMIN 3.8   No results for input(s): LIPASE, AMYLASE in the last 72 hours. Cardiac Enzymes No results for input(s): CKTOTAL, CKMB, CKMBINDEX, TROPONINI in the last 72 hours.   Telemetry  Remains bradycardic in 40s, with second degree heart block and intermittent conduction with competing junctional escape. (personally reviewed)  Radiology    CT Angio Chest PE W and/or Wo Contrast  Result Date: 03/14/2020 CLINICAL DATA:  Shortness of breath and chest pain worsening over the last 2 months. Abdominal distention. EXAM: CT ANGIOGRAPHY CHEST WITH CONTRAST TECHNIQUE: Multidetector CT imaging of the chest was performed using the standard protocol during bolus administration of intravenous contrast. Multiplanar CT image reconstructions and MIPs were obtained to evaluate the vascular anatomy. CONTRAST:  30mL OMNIPAQUE IOHEXOL 350 MG/ML SOLN COMPARISON:  Chest x-ray on 03/14/2020 FINDINGS: Cardiovascular: Heart is enlarged. There is focal atherosclerotic calcification of the coronary arteries. Median sternotomy and CABG. Pulmonary arteries are well opacified and there is no acute  pulmonary embolus. Mediastinum/Nodes: The visualized portion of the thyroid gland has a normal appearance. Small hiatal hernia. Otherwise the esophagus is normal. There are multiple calcified mediastinal and hilar lymph nodes. No suspicious lymphadenopathy. Lungs/Pleura: There are small bilateral pleural effusions. Numerous calcified granulomata are identified throughout the lungs. No suspicious noncalcified mass is identified. There is minimal atelectasis at the lung bases, RIGHT greater than LEFT. Upper Abdomen: Status post cholecystectomy. Calcified granulomata identified within the liver and spleen. Musculoskeletal: Median sternotomy. Remote wedge compression fracture of T5. Review of the MIP images confirms the above findings. IMPRESSION: 1. Technically adequate exam showing no acute pulmonary embolus. 2. Cardiomegaly, coronary artery disease, and prior CABG. 3. Small bilateral pleural effusions and minimal atelectasis at the lung bases, RIGHT greater than LEFT. 4. Evidence of prior granulomatous disease. 5. Small hiatal hernia. 6. Remote wedge compression fracture of T5. 7. Aortic Atherosclerosis (ICD10-I70.0). Electronically Signed   By: Norva Pavlov M.D.   On: 03/14/2020 14:10    Patient Profile     Samantha Short is a 74 y.o. female with a past medical history significant for CAD s/p CABG, COPD, Chronic diastolic CHF, and CKD III.  she was admitted for progressive dyspnea. EP asked to see due to high-grade second-degree HB.   Assessment & Plan    1. High grade AV block with 2:1 AV block and transient CHB Hemodynamically stable by vitals, but creatinine trending up. ? In setting of IV lasix earlier this mission and contrast from CTA.  PPM pending cath, which has been deferred to tomorrow, 7/7 pending labwork.   2. CAD s/p CABG Off BB with above.  Plan repeat cath tomorrow for ? Anginal component with her DOE. Had planned for today but deferred with AKI  3. AKI on CKD III 1.28 -> 1.49  -> 1.63 -> 1.84 Gentle hydration and holding po lasix today.  ? In setting of low cardiac output with A-V dyssynchrony vs s/p IV lasix and CTA. Follow.  EP will continue to follow. Plan tentative PPM tomorrow afternoon pending cath results. Would plan cMRI once healed from PPM implant with her current renal function trend.  For questions or updates, please contact [facility] Please consult [email or web address] for contact info under Cardiology/STEMI.  Signed, Graciella Freer, PA-C  03/16/2020, 12:36 PM    I have seen, examined the patient, and reviewed the above assessment and plan.  Changes to above are made where necessary.  On exam, comfortable, bradycardic.  She is planned for cath tomorrow if creatinine is stable.  I worry that her acute renal failure may be secondary to her AV block/ poor perfusion and may not improve.  She is not currently a candidate for cardiac MRI with  renal failure.  I would anticipate PPM tomorrow even if she is unable to have her cath.  Dr Graciela Husbands to revisit in the morning.  Very complicated patient.  A high level of decision making was required for this encounter.  Co Sign: Hillis Range, MD 03/16/2020 4:29 PM

## 2020-03-16 NOTE — Progress Notes (Signed)
PROGRESS NOTE    Samantha Short  LNL:[id number] DOB: 31-Jul-1946 DOA: 03/14/2020 PCP: Mila Palmer, MD    Brief Narrative:  74 y.o. female with medical history significant of diastolic congestive heart failure, chronic kidney disease hyper thyroidism now hypothyroid.  She comes in via EMS reporting worsening chest pain and shortness of breath over the last 2 months.  Patient was found to be 88% on room air by EMS that she was placed on O2.  Was seen at [facility] a few days ago and diagnosed with an asthma exacerbation and started on a prednisone taper.  (Patient has not had asthma trouble since she was in her 30s).  Patient states she used to be active swimming prior to Covid.  She states she has gained weight due to decreased activity but also states that at she has not been able to mow her yard or walk to her mailbox recently due to shortness of breath  In the ER she had chest x-ray that was unremarkable, CTA that ruled out PE and showed small bilateral pleural effusions and a small hiatal hernia.  CT also showed evidence of prior granulomatous disease. Labs showed an elevated WBC count most likely due to steroids-stable chronic kidney disease.  Cardiology consult requested by ER physician  Assessment & Plan:   Active Problems:   Hypertension   Acid reflux   Chronic diastolic CHF (congestive heart failure) (HCC)   Hypothyroidism   Acute respiratory failure with hypoxia (HCC)  Acute respiratory failure with CAD -Possible CHF exacerbation with an elevated BNP but x-ray unremarkable versus anxiety versus asthma/COPD exacerbation (only smoked as a teenager) -serial trop stable around mid 50's -Repeat BNP of 563 -Cardiology now following. Concern of high degree heart block. EP following, recommendation for heart cath and if neg, then possible cMRI -Cr today higher at 1.84. Heart cath held as a result, plan for gentle hydration per below  Worsening dyspnea and intolerance of  activity -Cardiology consulted per above -cont as per Cardiology recs above  Post ablative hypothyroidism -TSH 5.79 and free T4 1.12 -Continue Synthroid for now   Acute on Stage IIIa CKD -Follows with Dr. Glenna Fellows as an outpatient -Cr slowly trending up after receiving contrast CT was well as IV lasix at time of admit while on ACEI at home -Agree with holding ACEI for now and gentle hydration -Discussed with Cardiology, recommend continued gentle hydration today, repeat bmet in AM -If renal function improves, possible heart cath 7/7 -Will repeat bmet in AM  Acid reflux -Has a hiatal hernia -Takes a PPI daily -Seems stable  DVT prophylaxis: Heparin gtt Code Status: Full Family Communication: Pt in room, family not at bedside  Status is: Inpatient  Remains inpatient appropriate because:Ongoing diagnostic testing needed not appropriate for outpatient work up, IV treatments appropriate due to intensity of illness or inability to take PO and Inpatient level of care appropriate due to severity of illness   Dispo: The patient is from: Home              Anticipated d/c is to: Home              Anticipated d/c date is: 2 days              Patient currently is not medically stable to d/c.  Consultants:   Cardiology  EP  Procedures:     Antimicrobials: Anti-infectives (From admission, onward)   None      Subjective: Denies chest pains or sob  this AM  Objective: Vitals:   03/15/20 1932 03/16/20 0343 03/16/20 1052 03/16/20 1240  BP: (!) 128/47 (!) 134/56 128/61 (!) 134/47  Pulse: (!) 46 (!) 49 (!) 45 (!) 45  Resp: 18 18  16   Temp: 97.7 F (36.5 C) (!) 97.5 F (36.4 C) 98.2 F (36.8 C) (!) 97.3 F (36.3 C)  TempSrc: Oral Oral Oral Oral  SpO2: 100% 100% 98% 100%  Weight:  98.7 kg    Height:        Intake/Output Summary (Last 24 hours) at 03/16/2020 1459 Last data filed at 03/16/2020 1300 Gross per 24 hour  Intake 651.74 ml  Output 202 ml  Net 449.74 ml    Filed Weights   03/14/20 1705 03/15/20 0348 03/16/20 0343  Weight: 99.9 kg 98.6 kg 98.7 kg    Examination: General exam: Awake, laying in bed, in nad Respiratory system: Normal respiratory effort, no wheezing Cardiovascular system: regular rate, s1, s2 Gastrointestinal system: Soft, nondistended, positive BS Central nervous system: CN2-12 grossly intact, strength intact Extremities: Perfused, no clubbing Skin: Normal skin turgor, no notable skin lesions seen Psychiatry: Mood normal // no visual hallucinations   Data Reviewed: I have personally reviewed following labs and imaging studies  CBC: Recent Labs  Lab 03/14/20 1123 03/15/20 0154 03/16/20 0433  WBC 21.1* 13.5* 11.6*  NEUTROABS 16.9*  --   --   HGB 12.6 11.5* 12.1  HCT 40.5 35.9* 37.0  MCV 96.2 94.2 94.9  PLT 292 248 250   Basic Metabolic Panel: Recent Labs  Lab 03/14/20 1123 03/15/20 0154 03/16/20 0433  NA 143 139 140  K 4.4 4.0 4.0  CL 112* 109 107  CO2 18* 21* 23  GLUCOSE 100* 91 104*  BUN 37* 32* 38*  CREATININE 1.49* 1.63* 1.84*  CALCIUM 9.3 8.7* 9.0   GFR: Estimated Creatinine Clearance: 29.9 mL/min (A) (by C-G formula based on SCr of 1.84 mg/dL (H)). Liver Function Tests: Recent Labs  Lab 03/14/20 1123  AST 44*  ALT 35  ALKPHOS 66  BILITOT 1.0  PROT 6.8  ALBUMIN 3.8   No results for input(s): LIPASE, AMYLASE in the last 168 hours. No results for input(s): AMMONIA in the last 168 hours. Coagulation Profile: Recent Labs  Lab 03/16/20 0433  INR 1.3*   Cardiac Enzymes: No results for input(s): CKTOTAL, CKMB, CKMBINDEX, TROPONINI in the last 168 hours. BNP (last 3 results) No results for input(s): PROBNP in the last 8760 hours. HbA1C: No results for input(s): HGBA1C in the last 72 hours. CBG: No results for input(s): GLUCAP in the last 168 hours. Lipid Profile: Recent Labs    03/16/20 0433  CHOL 148  HDL 39*  LDLCALC 85  TRIG 05/17/20  CHOLHDL 3.8   Thyroid Function  Tests: Recent Labs    03/14/20 1946  TSH 5.790*  FREET4 1.12   Anemia Panel: No results for input(s): VITAMINB12, FOLATE, FERRITIN, TIBC, IRON, RETICCTPCT in the last 72 hours. Sepsis Labs: No results for input(s): PROCALCITON, LATICACIDVEN in the last 168 hours.  Recent Results (from the past 240 hour(s))  SARS Coronavirus 2 by RT PCR (hospital order, performed in [facility] hospital lab) Nasopharyngeal Nasopharyngeal Swab     Status: None   Collection Time: 03/14/20 11:36 AM   Specimen: Nasopharyngeal Swab  Result Value Ref Range Status   SARS Coronavirus 2 NEGATIVE NEGATIVE Final    Comment: (NOTE) SARS-CoV-2 target nucleic acids are NOT DETECTED.  The SARS-CoV-2 RNA is generally detectable in upper and  lower respiratory specimens during the acute phase of infection. The lowest concentration of SARS-CoV-2 viral copies this assay can detect is 250 copies / mL. A negative result does not preclude SARS-CoV-2 infection and should not be used as the sole basis for treatment or other patient management decisions.  A negative result may occur with improper specimen collection / handling, submission of specimen other than nasopharyngeal swab, presence of viral mutation(s) within the areas targeted by this assay, and inadequate number of viral copies (<250 copies / mL). A negative result must be combined with clinical observations, patient history, and epidemiological information.  Fact Sheet for Patients:   BoilerBrush.com.cy  Fact Sheet for Healthcare Providers: https://pope.com/  This test is not yet approved or  cleared by the [place] FDA and has been authorized for detection and/or diagnosis of SARS-CoV-2 by FDA under an Emergency Use Authorization (EUA).  This EUA will remain in effect (meaning this test can be used) for the duration of the COVID-19 declaration under Section 564(b)(1) of the Act, 21 U.S.C. section  360bbb-3(b)(1), unless the authorization is terminated or revoked sooner.  Performed at [facility], [address], [place], [place] [address]      Radiology Studies: No results found.  Scheduled Meds: . aspirin  81 mg Oral Pre-Cath  . aspirin EC  81 mg Oral QHS  . cholecalciferol  2,000 Units Oral Daily  . levothyroxine  12.5 mcg Oral Q0600  . multivitamin with minerals  1 tablet Oral Daily  . pantoprazole  40 mg Oral Daily  . sodium chloride flush  3 mL Intravenous Q12H  . sodium chloride flush  3 mL Intravenous Q12H   Continuous Infusions: . sodium chloride    . heparin 900 Units/hr (03/15/20 1934)     LOS: 1 day   Rickey Barbara, MD [facility] Pager On Amion  If 7PM-7AM, please contact night-coverage 03/16/2020, 2:59 PM

## 2020-03-17 ENCOUNTER — Inpatient Hospital Stay (HOSPITAL_COMMUNITY): Payer: Medicare HMO

## 2020-03-17 ENCOUNTER — Encounter (HOSPITAL_COMMUNITY)
Admission: EM | Disposition: A | Discharge status: Home / Self Care | Payer: Self-pay | Source: Home / Self Care | Attending: Internal Medicine

## 2020-03-17 DIAGNOSIS — I359 Nonrheumatic aortic valve disorder, unspecified: Secondary | ICD-10-CM

## 2020-03-17 DIAGNOSIS — I509 Heart failure, unspecified: Secondary | ICD-10-CM

## 2020-03-17 DIAGNOSIS — I441 Atrioventricular block, second degree: Secondary | ICD-10-CM | POA: Diagnosis not present

## 2020-03-17 DIAGNOSIS — I200 Unstable angina: Secondary | ICD-10-CM

## 2020-03-17 DIAGNOSIS — I5033 Acute on chronic diastolic (congestive) heart failure: Secondary | ICD-10-CM

## 2020-03-17 HISTORY — PX: RIGHT/LEFT HEART CATH AND CORONARY/GRAFT ANGIOGRAPHY: CATH118267

## 2020-03-17 HISTORY — PX: PACEMAKER IMPLANT: EP1218

## 2020-03-17 LAB — POCT I-STAT EG7
Acid-base deficit: 8 mmol/L — ABNORMAL HIGH (ref 0.0–2.0)
Bicarbonate: 18.3 mmol/L — ABNORMAL LOW (ref 20.0–28.0)
Calcium, Ion: 1.17 mmol/L (ref 1.15–1.40)
HCT: 32 % — ABNORMAL LOW (ref 36.0–46.0)
Hemoglobin: 10.9 g/dL — ABNORMAL LOW (ref 12.0–15.0)
O2 Saturation: 72 %
Potassium: 4 mmol/L (ref 3.5–5.1)
Sodium: 137 mmol/L (ref 135–145)
TCO2: 20 mmol/L — ABNORMAL LOW (ref 22–32)
pCO2, Ven: 38.9 mmHg — ABNORMAL LOW (ref 44.0–60.0)
pH, Ven: 7.281 (ref 7.250–7.430)
pO2, Ven: 43 mmHg (ref 32.0–45.0)

## 2020-03-17 LAB — POCT I-STAT 7, (LYTES, BLD GAS, ICA,H+H)
Acid-base deficit: 4 mmol/L — ABNORMAL HIGH (ref 0.0–2.0)
Bicarbonate: 20.2 mmol/L (ref 20.0–28.0)
Calcium, Ion: 1.24 mmol/L (ref 1.15–1.40)
HCT: 33 % — ABNORMAL LOW (ref 36.0–46.0)
Hemoglobin: 11.2 g/dL — ABNORMAL LOW (ref 12.0–15.0)
O2 Saturation: 100 %
Potassium: 4.3 mmol/L (ref 3.5–5.1)
Sodium: 145 mmol/L (ref 135–145)
TCO2: 21 mmol/L — ABNORMAL LOW (ref 22–32)
pCO2 arterial: 34.8 mmHg (ref 32.0–48.0)
pH, Arterial: 7.372 (ref 7.350–7.450)
pO2, Arterial: 223 mmHg — ABNORMAL HIGH (ref 83.0–108.0)

## 2020-03-17 LAB — BASIC METABOLIC PANEL
Anion gap: 11 (ref 5–15)
BUN: 35 mg/dL — ABNORMAL HIGH (ref 8–23)
CO2: 20 mmol/L — ABNORMAL LOW (ref 22–32)
Calcium: 8.9 mg/dL (ref 8.9–10.3)
Chloride: 109 mmol/L (ref 98–111)
Creatinine, Ser: 1.6 mg/dL — ABNORMAL HIGH (ref 0.44–1.00)
GFR calc Af Amer: 37 mL/min — ABNORMAL LOW (ref >60)
GFR calc non Af Amer: 32 mL/min — ABNORMAL LOW (ref >60)
Glucose, Bld: 106 mg/dL — ABNORMAL HIGH (ref 70–99)
Potassium: 4.4 mmol/L (ref 3.5–5.1)
Sodium: 140 mmol/L (ref 135–145)

## 2020-03-17 LAB — CBC
HCT: 35.6 % — ABNORMAL LOW (ref 36.0–46.0)
Hemoglobin: 11.3 g/dL — ABNORMAL LOW (ref 12.0–15.0)
MCH: 30.1 pg (ref 26.0–34.0)
MCHC: 31.7 g/dL (ref 30.0–36.0)
MCV: 94.9 fL (ref 80.0–100.0)
Platelets: 251 10*3/uL (ref 150–400)
RBC: 3.75 MIL/uL — ABNORMAL LOW (ref 3.87–5.11)
RDW: 13 % (ref 11.5–15.5)
WBC: 8.9 10*3/uL (ref 4.0–10.5)
nRBC: 0 % (ref 0.0–0.2)

## 2020-03-17 LAB — SURGICAL PCR SCREEN
MRSA, PCR: NEGATIVE
Staphylococcus aureus: NEGATIVE

## 2020-03-17 LAB — HEPARIN LEVEL (UNFRACTIONATED): Heparin Unfractionated: 0.43 IU/mL (ref 0.30–0.70)

## 2020-03-17 SURGERY — PACEMAKER IMPLANT

## 2020-03-17 SURGERY — RIGHT/LEFT HEART CATH AND CORONARY/GRAFT ANGIOGRAPHY
Anesthesia: LOCAL

## 2020-03-17 MED ORDER — FENTANYL CITRATE (PF) 100 MCG/2ML IJ SOLN
INTRAMUSCULAR | Status: DC | PRN
  Administered 2020-03-17: 25 ug via INTRAVENOUS
  Administered 2020-03-17: 50 ug via INTRAVENOUS
  Administered 2020-03-17: 25 ug via INTRAVENOUS

## 2020-03-17 MED ORDER — MIDAZOLAM HCL 5 MG/5ML IJ SOLN
INTRAMUSCULAR | Status: DC | PRN
  Administered 2020-03-17: 1 mg via INTRAVENOUS
  Administered 2020-03-17: 3 mg via INTRAVENOUS
  Administered 2020-03-17 (×2): 1 mg via INTRAVENOUS

## 2020-03-17 MED ORDER — SODIUM CHLORIDE 0.9 % IV SOLN
80.0000 mg | INTRAVENOUS | Status: AC
  Administered 2020-03-17: 80 mg

## 2020-03-17 MED ORDER — HEPARIN (PORCINE) IN NACL 1000-0.9 UT/500ML-% IV SOLN
INTRAVENOUS | Status: AC
  Filled 2020-03-17: qty 500

## 2020-03-17 MED ORDER — IOHEXOL 350 MG/ML SOLN
INTRAVENOUS | Status: DC | PRN
  Administered 2020-03-17: 15 mL via INTRAVENOUS

## 2020-03-17 MED ORDER — LIDOCAINE HCL (PF) 1 % IJ SOLN
INTRAMUSCULAR | Status: DC | PRN
  Administered 2020-03-17: 50 mL
  Administered 2020-03-17: 10 mL

## 2020-03-17 MED ORDER — SODIUM CHLORIDE 0.9% FLUSH: 3.0000 mL | Freq: Two times a day (BID) | INTRAVENOUS | Status: DC

## 2020-03-17 MED ORDER — LIDOCAINE HCL (PF) 1 % IJ SOLN
INTRAMUSCULAR | Status: AC
  Filled 2020-03-17: qty 60

## 2020-03-17 MED ORDER — FENTANYL CITRATE (PF) 100 MCG/2ML IJ SOLN
INTRAMUSCULAR | Status: DC | PRN
  Administered 2020-03-17: 25 ug via INTRAVENOUS

## 2020-03-17 MED ORDER — VANCOMYCIN HCL 1500 MG/300ML IV SOLN
1500.0000 mg | INTRAVENOUS | Status: AC
  Administered 2020-03-17: 1500 mg via INTRAVENOUS
  Filled 2020-03-17 (×2): qty 300

## 2020-03-17 MED ORDER — HEPARIN (PORCINE) IN NACL 1000-0.9 UT/500ML-% IV SOLN
INTRAVENOUS | Status: DC | PRN
  Administered 2020-03-17 (×2): 500 mL

## 2020-03-17 MED ORDER — GADOBUTROL 1 MMOL/ML IV SOLN
10.0000 mL | Freq: Once | INTRAVENOUS | Status: AC | PRN
  Administered 2020-03-17: 10 mL via INTRAVENOUS

## 2020-03-17 MED ORDER — VERAPAMIL HCL 2.5 MG/ML IV SOLN
INTRAVENOUS | Status: AC
  Filled 2020-03-17: qty 2

## 2020-03-17 MED ORDER — FENTANYL CITRATE (PF) 100 MCG/2ML IJ SOLN
INTRAMUSCULAR | Status: AC
  Filled 2020-03-17: qty 2

## 2020-03-17 MED ORDER — MIDAZOLAM HCL 2 MG/2ML IJ SOLN
INTRAMUSCULAR | Status: AC
  Filled 2020-03-17: qty 2

## 2020-03-17 MED ORDER — SODIUM CHLORIDE 0.9 % IV SOLN: INTRAVENOUS | Status: DC

## 2020-03-17 MED ORDER — SODIUM CHLORIDE 0.9 % IV SOLN
INTRAVENOUS | Status: AC
  Filled 2020-03-17: qty 2

## 2020-03-17 MED ORDER — SODIUM CHLORIDE 0.9 % IV SOLN: 250.0000 mL | INTRAVENOUS | Status: DC | PRN

## 2020-03-17 MED ORDER — SODIUM CHLORIDE 0.9 % IV SOLN: INTRAVENOUS | Status: DC | PRN

## 2020-03-17 MED ORDER — SODIUM CHLORIDE 0.9 % IV SOLN: INTRAVENOUS | Status: AC

## 2020-03-17 MED ORDER — MIDAZOLAM HCL 5 MG/5ML IJ SOLN
INTRAMUSCULAR | Status: AC
  Filled 2020-03-17: qty 5

## 2020-03-17 MED ORDER — MELATONIN 5 MG PO TABS
10.0000 mg | ORAL_TABLET | Freq: Every evening | ORAL | Status: DC | PRN
  Administered 2020-03-17: 10 mg via ORAL
  Filled 2020-03-17: qty 2

## 2020-03-17 MED ORDER — HEPARIN (PORCINE) IN NACL 1000-0.9 UT/500ML-% IV SOLN
INTRAVENOUS | Status: DC | PRN
  Administered 2020-03-17: 500 mL

## 2020-03-17 MED ORDER — ACETAMINOPHEN 325 MG PO TABS: 325.0000 mg | ORAL_TABLET | ORAL | Status: DC | PRN

## 2020-03-17 MED ORDER — ONDANSETRON HCL 4 MG/2ML IJ SOLN
4.0000 mg | Freq: Four times a day (QID) | INTRAMUSCULAR | Status: DC | PRN
  Filled 2020-03-17: qty 2

## 2020-03-17 MED ORDER — LIDOCAINE HCL (PF) 1 % IJ SOLN
INTRAMUSCULAR | Status: AC
  Filled 2020-03-17: qty 30

## 2020-03-17 MED ORDER — IOHEXOL 350 MG/ML SOLN
INTRAVENOUS | Status: DC | PRN
  Administered 2020-03-17: 48 mL

## 2020-03-17 MED ORDER — CHLORHEXIDINE GLUCONATE 4 % EX LIQD
60.0000 mL | Freq: Once | CUTANEOUS | Status: AC
  Administered 2020-03-17: 4 via TOPICAL
  Filled 2020-03-17: qty 60

## 2020-03-17 MED ORDER — MIDAZOLAM HCL 2 MG/2ML IJ SOLN
INTRAMUSCULAR | Status: DC | PRN
  Administered 2020-03-17: 1 mg via INTRAVENOUS

## 2020-03-17 MED ORDER — PANTOPRAZOLE SODIUM 40 MG PO TBEC
40.0000 mg | DELAYED_RELEASE_TABLET | Freq: Two times a day (BID) | ORAL | Status: DC
  Administered 2020-03-17 – 2020-03-18 (×2): 40 mg via ORAL
  Filled 2020-03-17 (×2): qty 1

## 2020-03-17 MED ORDER — VANCOMYCIN HCL IN DEXTROSE 1-5 GM/200ML-% IV SOLN
1000.0000 mg | Freq: Two times a day (BID) | INTRAVENOUS | Status: AC
  Administered 2020-03-18: 1000 mg via INTRAVENOUS
  Filled 2020-03-17: qty 200

## 2020-03-17 MED ORDER — LIDOCAINE HCL (PF) 1 % IJ SOLN
INTRAMUSCULAR | Status: DC | PRN
  Administered 2020-03-17: 15 mL via INTRADERMAL

## 2020-03-17 MED ORDER — SODIUM CHLORIDE 0.9% FLUSH: 3.0000 mL | INTRAVENOUS | Status: DC | PRN

## 2020-03-17 SURGICAL SUPPLY — 12 items
CABLE SURGICAL S-101-97-12 (CABLE) ×4 IMPLANT
DERMABOND ADVANCED (GAUZE/BANDAGES/DRESSINGS) ×2
DERMABOND ADVANCED .7 DNX12 (GAUZE/BANDAGES/DRESSINGS) ×2 IMPLANT
HEMOSTAT SURGICEL 2X4 FIBR (HEMOSTASIS) ×2 IMPLANT
IPG PACE AZUR XT DR MRI W1DR01 (Pacemaker) ×1 IMPLANT
KIT MICROPUNCTURE NIT STIFF (SHEATH) ×2 IMPLANT
LEAD CAPSURE NOVUS 5076-52CM (Lead) ×2 IMPLANT
LEAD CAPSURE NOVUS 5076-58CM (Lead) ×2 IMPLANT
PACE AZURE XT DR MRI W1DR01 (Pacemaker) ×2 IMPLANT
PAD PRO RADIOLUCENT 2001M-C (PAD) ×2 IMPLANT
SHEATH 7FR PRELUDE SNAP 13 (SHEATH) ×4 IMPLANT
TRAY PACEMAKER INSERTION (PACKS) ×4 IMPLANT

## 2020-03-17 SURGICAL SUPPLY — 15 items
CATH INFINITI 5 FR IM (CATHETERS) ×1 IMPLANT
CATH INFINITI 5FR MULTPACK ANG (CATHETERS) ×1 IMPLANT
CATH SWAN GANZ 7F STRAIGHT (CATHETERS) ×1 IMPLANT
DEVICE CLOSURE MYNXGRIP 5F (Vascular Products) ×1 IMPLANT
DEVICE CLOSURE MYNXGRIP 6/7F (Vascular Products) ×1 IMPLANT
KIT HEART LEFT (KITS) ×2 IMPLANT
KIT MICROPUNCTURE NIT STIFF (SHEATH) ×1 IMPLANT
PACK CARDIAC CATHETERIZATION (CUSTOM PROCEDURE TRAY) ×2 IMPLANT
SHEATH PINNACLE 5F 10CM (SHEATH) ×1 IMPLANT
SHEATH PINNACLE 7F 10CM (SHEATH) ×1 IMPLANT
SHEATH PROBE COVER 6X72 (BAG) ×1 IMPLANT
TRANSDUCER W/STOPCOCK (MISCELLANEOUS) ×2 IMPLANT
TUBING CIL FLEX 10 FLL-RA (TUBING) ×2 IMPLANT
WIRE EMERALD 3MM-J .035X150CM (WIRE) ×1 IMPLANT
WIRE EMERALD 3MM-J .035X260CM (WIRE) ×1 IMPLANT

## 2020-03-17 NOTE — Interval H&P Note (Signed)
History and Physical Interval Note:  03/17/2020 1:38 PM  Samantha Short  has presented today for surgery, with the diagnosis of unstable angina.  The various methods of treatment have been discussed with the patient and family. After consideration of risks, benefits and other options for treatment, the patient has consented to  Procedure(s): LEFT HEART CATH AND CORS/GRAFTS ANGIOGRAPHY (N/A) as a surgical intervention.  The patient's history has been reviewed, patient examined, no change in status, stable for surgery.  I have reviewed the patient's chart and labs.  Questions were answered to the patient's satisfaction.     Lorine Bears

## 2020-03-17 NOTE — Progress Notes (Signed)
ANTICOAGULATION CONSULT NOTE   Pharmacy Consult for heparin Indication: Unstable Angina  Allergies  Allergen Reactions  . Ceclor [Cefaclor] Other (See Comments)    Lost vision in eye  . Hctz [Hydrochlorothiazide] Other (See Comments)    Renal insufficiency  . Lipitor [Atorvastatin Calcium] Other (See Comments)    Increased A1C  . Norvasc [Amlodipine] Other (See Comments)    Makes patient stiff  . Penicillins Hives, Shortness Of Breath, Itching and Rash    Has patient had a PCN reaction causing immediate rash, facial/tongue/throat swelling, SOB or lightheadedness with hypotension: Yes Has patient had a PCN reaction causing severe rash involving mucus membranes or skin necrosis: Unk Has patient had a PCN reaction that required hospitalization: No Has patient had a PCN reaction occurring within the last 10 years: No If all of the above answers are "NO", then may proceed with Cephalosporin use.   . Sulfa Antibiotics Other (See Comments)    CAUSES SHOCK  . Statins Other (See Comments)    MYALGIAS AND WEAKNESS  . Erythromycin Rash  . Latex Rash  . Levofloxacin Rash  . Tetracyclines & Related Rash    Patient Measurements: Height: 5\' 2"  (157.5 cm) Weight: 100.2 kg (220 lb 12.8 oz) (scale b) IBW/kg (Calculated) : 50.1 Heparin Dosing Weight: 74 kg  Vital Signs: Temp: 98 F (36.7 C) (07/07 1214) Temp Source: Oral (07/07 1214) BP: 146/63 (07/07 1428) Pulse Rate: 0 (07/07 1428)  Labs: Recent Labs    03/14/20 1946 03/15/20 0154 03/15/20 0154 03/15/20 1019 03/16/20 0433 03/17/20 0714  HGB  --  11.5*   < >  --  12.1 11.3*  HCT  --  35.9*  --   --  37.0 35.6*  PLT  --  248  --   --  250 251  LABPROT  --   --   --   --  15.2  --   INR  --   --   --   --  1.3*  --   HEPARINUNFRC  --  0.35   < > 0.51 0.42 0.43  CREATININE  --  1.63*  --   --  1.84* 1.60*  TROPONINIHS 57*  --   --   --   --   --    < > = values in this interval not displayed.    Estimated Creatinine  Clearance: 34.7 mL/min (A) (by C-G formula based on SCr of 1.6 mg/dL (H)).   Medical History: Past Medical History:  Diagnosis Date  . Asthma   . Chronic diastolic CHF (congestive heart failure) (HCC)    a. 02/2011 Echo: EF 55-60%, Gr2 DD, Mild MR  . Coronary artery disease    a. s/p CABG x 1 2010:  LIMA->LAD.;  b. amdx for CP => LHC 07/04/12: LAD 70-80%, mid RCA 30%, LIMA-LAD patent with competitive flow limiting distal LAD filling, EF 70% with hyperdynamic LV function. Medical therapy continued.  . Dyslipidemia   . GERD (gastroesophageal reflux disease)   . Hyperlipidemia   . Hypertension   . Hyperthyroidism   . Mitral regurgitation    a. mild by echo 02/2011.    Assessment: 74 yo W with unstable angina in the setting of arrhythmia starting on heparin. No AC PTA. H/H, plt stable, overall low bleed risk.   Heparin continues to be at goal on confirmatory lab. No bleeding noted. CBC stable.   Goal of Therapy:  Heparin level 0.3-0.7 units/ml Monitor platelets by anticoagulation protocol: Yes  Plan:  Heparin currently off for cath Follow up plan.

## 2020-03-17 NOTE — Progress Notes (Signed)
Progress Note  Patient Name: Samantha Short Date of Encounter: 03/17/2020  [facility] Cardiologist: Verne Carrow, MD   Subjective   She generally feels better. No chest pain.   Inpatient Medications    Scheduled Meds: . aspirin EC  81 mg Oral QHS  . cholecalciferol  2,000 Units Oral Daily  . levothyroxine  12.5 mcg Oral Q0600  . multivitamin with minerals  1 tablet Oral Daily  . pantoprazole  40 mg Oral Daily  . sodium chloride flush  3 mL Intravenous Q12H  . sodium chloride flush  3 mL Intravenous Q12H   Continuous Infusions: . sodium chloride    . sodium chloride    . sodium chloride 1 mL/kg/hr (03/16/20 2026)   PRN Meds: sodium chloride, sodium chloride, acetaminophen **OR** acetaminophen, albuterol, ondansetron **OR** ondansetron (ZOFRAN) IV, sodium chloride flush, sodium chloride flush   Vital Signs    Vitals:   03/16/20 1052 03/16/20 1240 03/16/20 1930 03/17/20 0459  BP: 128/61 (!) 134/47 (!) 151/60 (!) 142/51  Pulse: (!) 45 (!) 45 (!) 48 (!) 44  Resp:  16 18 20   Temp: 98.2 F (36.8 C) (!) 97.3 F (36.3 C) 97.7 F (36.5 C) 97.6 F (36.4 C)  TempSrc: Oral Oral Oral Oral  SpO2: 98% 100% 100% 100%  Weight:    100.2 kg  Height:        Intake/Output Summary (Last 24 hours) at 03/17/2020 0838 Last data filed at 03/17/2020 0656 Gross per 24 hour  Intake 1799.68 ml  Output 300 ml  Net 1499.68 ml   Last 3 Weights 03/17/2020 03/16/2020 03/15/2020  Weight (lbs) 220 lb 12.8 oz 217 lb 8 oz 217 lb 4.8 oz  Weight (kg) 100.154 kg 98.657 kg 98.567 kg      Telemetry    2-1 AV block heart rate 49 bpm, brief transient third-degree AV block- Personally Reviewed  ECG    2-1 AV block- Personally Reviewed  Physical Exam   GEN: No acute distress.   Neck: No JVD Cardiac: Bradycardic regular, 2/6 SM, no rubs, or gallops.  Respiratory: Clear to auscultation bilaterally. GI: Soft, nontender, non-distended  MS: No edema; No deformity. Neuro:  Nonfocal  Psych:  Normal affect   Labs    High Sensitivity Troponin:   Recent Labs  Lab 03/14/20 1123 03/14/20 1314 03/14/20 1946  TROPONINIHS 43* 58* 57*      Chemistry Recent Labs  Lab 03/14/20 1123 03/14/20 1123 03/15/20 0154 03/16/20 0433 03/17/20 0714  NA 143   < > 139 140 140  K 4.4   < > 4.0 4.0 4.4  CL 112*   < > 109 107 109  CO2 18*   < > 21* 23 20*  GLUCOSE 100*   < > 91 104* 106*  BUN 37*   < > 32* 38* 35*  CREATININE 1.49*   < > 1.63* 1.84* 1.60*  CALCIUM 9.3   < > 8.7* 9.0 8.9  PROT 6.8  --   --   --   --   ALBUMIN 3.8  --   --   --   --   AST 44*  --   --   --   --   ALT 35  --   --   --   --   ALKPHOS 66  --   --   --   --   BILITOT 1.0  --   --   --   --   GFRNONAA 34*   < >  31* 27* 32*  GFRAA 40*   < > 36* 31* 37*  ANIONGAP 13   < > 9 10 11   < > = values in this interval not displayed.     Hematology Recent Labs  Lab 03/15/20 0154 03/16/20 0433 03/17/20 0714  WBC 13.5* 11.6* 8.9  RBC 3.81* 3.90 3.75*  HGB 11.5* 12.1 11.3*  HCT 35.9* 37.0 35.6*  MCV 94.2 94.9 94.9  MCH 30.2 31.0 30.1  MCHC 32.0 32.7 31.7  RDW 13.2 13.1 13.0  PLT 248 250 251    BNP Recent Labs  Lab 03/14/20 1123 03/15/20 0154  BNP 883.2* 563.7*     DDimer No results for input(s): DDIMER in the last 168 hours.   Radiology    No results found.  Cardiac Studies   Echo EF normal with mild aortic stenosis.  01/21/2020.  Patient Profile     74 y.o. female here with high degree AV block, mild to moderate aortic stenosis  Assessment & Plan    1. High degree AV block/2-1 AV block/transient third-degree AV block -EP has been consulted. Appreciate Dr Klein's input. Plan [facility] with graft today prior to considering for pacemaker. Also with LVH on Echo may want to consider cardiac MRI but per Dr Allred's note this could be done later post PM.  Otherwise could do PYP scan if the concern is of amyloid. Would avoid additional contrast studies at this time.  2. CAD/CABG x 1 with LIMA to  the LAD -Previously beta-blocker was stopped because of perceived symptoms by patient.  Troponins were mildly elevated nonspecific.  Recommend  cardiac catheterization right and left today. Renal function improved with creatinine down to 1.6.  Heparin IV was started. Hydrate.   3. COPD/asthma/diastolic dysfunction -BNP mildly elevated previously.  Will hold p.o. Lasix. - patient received shot of steroids and Medrol dose pack prior to admission which explains elevated WBC- now improved from 21K to 11K.   4. Acute kidney injury on top of CKD stage 3.  -Creatinine in February 1.28, 1.49 on admission, then 1.63 and today 1.84 yesterday. Improved to 1.6 today.  Possibly secondary to decreased cardiac output, diuretics, as well as recent contrast for negative CT PE.  -will hold lasix and  Lisinopril.  5. Chronic kidney disease stage IIIa -Continue to monitor.  Lets continue to hold her lisinopril.  6. Mild aortic stenosis -Has been recently described as mild to possibly moderate.  Following clinically.        For questions or updates, please contact [facility] Please consult [email or web address] for contact info under        Signed, Philomene Haff, MD  03/17/2020, 8:38 AM    

## 2020-03-17 NOTE — H&P (View-Only) (Signed)
Progress Note  Patient Name: Samantha Short Date of Encounter: 03/17/2020  [facility] Cardiologist: Verne Carrow, MD   Subjective   She generally feels better. No chest pain.   Inpatient Medications    Scheduled Meds: . aspirin EC  81 mg Oral QHS  . cholecalciferol  2,000 Units Oral Daily  . levothyroxine  12.5 mcg Oral Q0600  . multivitamin with minerals  1 tablet Oral Daily  . pantoprazole  40 mg Oral Daily  . sodium chloride flush  3 mL Intravenous Q12H  . sodium chloride flush  3 mL Intravenous Q12H   Continuous Infusions: . sodium chloride    . sodium chloride    . sodium chloride 1 mL/kg/hr (03/16/20 2026)   PRN Meds: sodium chloride, sodium chloride, acetaminophen **OR** acetaminophen, albuterol, ondansetron **OR** ondansetron (ZOFRAN) IV, sodium chloride flush, sodium chloride flush   Vital Signs    Vitals:   03/16/20 1052 03/16/20 1240 03/16/20 1930 03/17/20 0459  BP: 128/61 (!) 134/47 (!) 151/60 (!) 142/51  Pulse: (!) 45 (!) 45 (!) 48 (!) 44  Resp:  16 18 20   Temp: 98.2 F (36.8 C) (!) 97.3 F (36.3 C) 97.7 F (36.5 C) 97.6 F (36.4 C)  TempSrc: Oral Oral Oral Oral  SpO2: 98% 100% 100% 100%  Weight:    100.2 kg  Height:        Intake/Output Summary (Last 24 hours) at 03/17/2020 0838 Last data filed at 03/17/2020 0656 Gross per 24 hour  Intake 1799.68 ml  Output 300 ml  Net 1499.68 ml   Last 3 Weights 03/17/2020 03/16/2020 03/15/2020  Weight (lbs) 220 lb 12.8 oz 217 lb 8 oz 217 lb 4.8 oz  Weight (kg) 100.154 kg 98.657 kg 98.567 kg      Telemetry    2-1 AV block heart rate 49 bpm, brief transient third-degree AV block- Personally Reviewed  ECG    2-1 AV block- Personally Reviewed  Physical Exam   GEN: No acute distress.   Neck: No JVD Cardiac: Bradycardic regular, 2/6 SM, no rubs, or gallops.  Respiratory: Clear to auscultation bilaterally. GI: Soft, nontender, non-distended  MS: No edema; No deformity. Neuro:  Nonfocal  Psych:  Normal affect   Labs    High Sensitivity Troponin:   Recent Labs  Lab 03/14/20 1123 03/14/20 1314 03/14/20 1946  TROPONINIHS 43* 58* 57*      Chemistry Recent Labs  Lab 03/14/20 1123 03/14/20 1123 03/15/20 0154 03/16/20 0433 03/17/20 0714  NA 143   < > 139 140 140  K 4.4   < > 4.0 4.0 4.4  CL 112*   < > 109 107 109  CO2 18*   < > 21* 23 20*  GLUCOSE 100*   < > 91 104* 106*  BUN 37*   < > 32* 38* 35*  CREATININE 1.49*   < > 1.63* 1.84* 1.60*  CALCIUM 9.3   < > 8.7* 9.0 8.9  PROT 6.8  --   --   --   --   ALBUMIN 3.8  --   --   --   --   AST 44*  --   --   --   --   ALT 35  --   --   --   --   ALKPHOS 66  --   --   --   --   BILITOT 1.0  --   --   --   --   GFRNONAA 34*   < >  31* 27* 32*  GFRAA 40*   < > 36* 31* 37*  ANIONGAP 13   < > 9 10 11    < > = values in this interval not displayed.     Hematology Recent Labs  Lab 03/15/20 0154 03/16/20 0433 03/17/20 0714  WBC 13.5* 11.6* 8.9  RBC 3.81* 3.90 3.75*  HGB 11.5* 12.1 11.3*  HCT 35.9* 37.0 35.6*  MCV 94.2 94.9 94.9  MCH 30.2 31.0 30.1  MCHC 32.0 32.7 31.7  RDW 13.2 13.1 13.0  PLT 248 250 251    BNP Recent Labs  Lab 03/14/20 1123 03/15/20 0154  BNP 883.2* 563.7*     DDimer No results for input(s): DDIMER in the last 168 hours.   Radiology    No results found.  Cardiac Studies   Echo EF normal with mild aortic stenosis.  01/21/2020.  Patient Profile     74 y.o. female here with high degree AV block, mild to moderate aortic stenosis  Assessment & Plan    1. High degree AV block/2-1 AV block/transient third-degree AV block -EP has been consulted. Appreciate Dr 74 input. Plan [facility] with graft today prior to considering for pacemaker. Also with LVH on Echo may want to consider cardiac MRI but per Dr [facility] note this could be done later post PM.  Otherwise could do PYP scan if the concern is of amyloid. Would avoid additional contrast studies at this time.  2. CAD/CABG x 1 with LIMA to  the LAD -Previously beta-blocker was stopped because of perceived symptoms by patient.  Troponins were mildly elevated nonspecific.  Recommend  cardiac catheterization right and left today. Renal function improved with creatinine down to 1.6.  Heparin IV was started. Hydrate.   3. COPD/asthma/diastolic dysfunction -BNP mildly elevated previously.  Will hold p.o. Lasix. - patient received shot of steroids and Medrol dose pack prior to admission which explains elevated WBC- now improved from 21K to 11K.   4. Acute kidney injury on top of CKD stage 3.  -Creatinine in February 1.28, 1.49 on admission, then 1.63 and today 1.84 yesterday. Improved to 1.6 today.  Possibly secondary to decreased cardiac output, diuretics, as well as recent contrast for negative CT PE.  -will hold lasix and  Lisinopril.  5. Chronic kidney disease stage IIIa -Continue to monitor.  Lets continue to hold her lisinopril.  6. Mild aortic stenosis -Has been recently described as mild to possibly moderate.  Following clinically.        For questions or updates, please contact [facility] Please consult [email or web address] for contact info under        Signed, Devi Hopman 01-09-1974, MD  03/17/2020, 8:38 AM

## 2020-03-17 NOTE — Progress Notes (Addendum)
PROGRESS NOTE    Samantha Short  DGU:[id number] DOB: 10-03-1945 DOA: 03/14/2020 PCP: Mila Palmer, MD    Brief Narrative:  74 y.o. female with medical history significant of diastolic congestive heart failure, chronic kidney disease hyper thyroidism now hypothyroid.  She comes in via EMS reporting worsening chest pain and shortness of breath over the last 2 months.  Patient was found to be 88% on room air by EMS that she was placed on O2.  Was seen at [facility] a few days ago and diagnosed with an asthma exacerbation and started on a prednisone taper.  (Patient has not had asthma trouble since she was in her 30s).  Patient states she used to be active swimming prior to Covid.  She states she has gained weight due to decreased activity but also states that at she has not been able to mow her yard or walk to her mailbox recently due to shortness of breath  In the ER she had chest x-ray that was unremarkable, CTA that ruled out PE and showed small bilateral pleural effusions and a small hiatal hernia.  CT also showed evidence of prior granulomatous disease. Labs showed an elevated WBC count most likely due to steroids-stable chronic kidney disease.  Cardiology consult requested by ER physician  Assessment & Plan:   Active Problems:   Hypertension   Acid reflux   Chronic diastolic CHF (congestive heart failure) (HCC)   Hypothyroidism   Acute respiratory failure with hypoxia (HCC)   Acute on chronic congestive heart failure (HCC)   AVD (aortic valve disease)  Acute respiratory failure with CAD -Possibly related to high degree heart block -serial trop stable around mid 50's -Repeat BNP of 563 -Cardiology now following. Concern of high degree heart block.  -EP following  -patient to undergo cardiac cath today and if no ischemic cause of heart block then plan for PPM placement  Worsening dyspnea and intolerance of activity -Cardiology consulted per above -cont as per Cardiology recs  above -Clinically improving  Post ablative hypothyroidism -TSH 5.79 and free T4 1.12 -Continue Synthroid for now   Acute on Stage IIIa CKD -Follows with Dr. Glenna Fellows as an outpatient -Cr slowly trending up after initially receiving contrast CT was well as IV lasix at time of admit while on ACEI at home -Agree with holding ACEI for now and gentle hydration -Renal function appears to have improved with hydration -continue to follow after administration of contrast for cardiac cath  Acid reflux -Has a hiatal hernia -continue PPI BID -Seems stable  Hypothyroidism -continue on synthroid -TSH 5.7  DVT prophylaxis: Heparin gtt Code Status: Full Family Communication: Pt in room, family not at bedside  Status is: Inpatient  Remains inpatient appropriate because:Ongoing diagnostic testing needed not appropriate for outpatient work up, IV treatments appropriate due to intensity of illness or inability to take PO and Inpatient level of care appropriate due to severity of illness   Dispo: The patient is from: Home              Anticipated d/c is to: Home              Anticipated d/c date is: 2 days              Patient currently is not medically stable to d/c.  Consultants:   Cardiology  EP  Procedures:   7/7 PPM placement  Antimicrobials: Anti-infectives (From admission, onward)   Start     Dose/Rate Route Frequency Ordered Stop  03/18/20 0300  vancomycin (VANCOCIN) IVPB 1000 mg/200 mL premix     Discontinue     1,000 mg 200 mL/hr over 60 Minutes Intravenous Every 12 hours 03/17/20 1823 03/18/20 1459   03/17/20 1742  gentamicin (GARAMYCIN) 80 mg in sodium chloride 0.9 % 500 mL irrigation  Status:  Discontinued          As needed 03/17/20 1742 03/17/20 1808   03/17/20 1200  gentamicin (GARAMYCIN) 80 mg in sodium chloride 0.9 % 500 mL irrigation        80 mg Irrigation On call 03/17/20 0959 03/17/20 1553   03/17/20 1045  vancomycin (VANCOREADY) IVPB 1500 mg/300 mL         1,500 mg 150 mL/hr over 120 Minutes Intravenous On call 03/17/20 0959 03/17/20 1739      Subjective: Patient had PPM placement today. Developed shortness of breath during procedure which has since resolved.  Objective: Vitals:   03/17/20 1428 03/17/20 1448 03/17/20 1819 03/17/20 1943  BP: (!) 146/63  (!) 148/73 (!) 155/73  Pulse: (!) 0  87 86  Resp: (!) 7  14 19   Temp:   97.8 F (36.6 C) 98.1 F (36.7 C)  TempSrc:   Oral Oral  SpO2: 100% 100% 100% 100%  Weight:      Height:        Intake/Output Summary (Last 24 hours) at 03/17/2020 1951 Last data filed at 03/17/2020 1941 Gross per 24 hour  Intake 1319.68 ml  Output 700 ml  Net 619.68 ml   Filed Weights   03/15/20 0348 03/16/20 0343 03/17/20 0459  Weight: 98.6 kg 98.7 kg 100.2 kg    Examination: General exam: Alert, awake, oriented x 3 Respiratory system: Clear to auscultation. Respiratory effort normal. Cardiovascular system:RRR. No murmurs, rubs, gallops. Gastrointestinal system: Abdomen is nondistended, soft and nontender. No organomegaly or masses felt. Normal bowel sounds heard. Central nervous system: Alert and oriented. No focal neurological deficits. Extremities: left arm in sling Skin: No rashes, lesions or ulcers Psychiatry: Judgement and insight appear normal. Mood & affect appropriate.   Data Reviewed: I have personally reviewed following labs and imaging studies  CBC: Recent Labs  Lab 03/14/20 1123 03/14/20 1123 03/15/20 0154 03/16/20 0433 03/17/20 0714 03/17/20 1401 03/17/20 1402  WBC 21.1*  --  13.5* 11.6* 8.9  --   --   NEUTROABS 16.9*  --   --   --   --   --   --   HGB 12.6   < > 11.5* 12.1 11.3* 11.2* 10.9*  HCT 40.5   < > 35.9* 37.0 35.6* 33.0* 32.0*  MCV 96.2  --  94.2 94.9 94.9  --   --   PLT 292  --  248 250 251  --   --    < > = values in this interval not displayed.   Basic Metabolic Panel: Recent Labs  Lab 03/14/20 1123 03/14/20 1123 03/15/20 0154 03/16/20 0433  03/17/20 0714 03/17/20 1401 03/17/20 1402  NA 143   < > 139 140 140 145 137  K 4.4   < > 4.0 4.0 4.4 4.3 4.0  CL 112*  --  109 107 109  --   --   CO2 18*  --  21* 23 20*  --   --   GLUCOSE 100*  --  91 104* 106*  --   --   BUN 37*  --  32* 38* 35*  --   --   CREATININE 1.49*  --  1.63* 1.84* 1.60*  --   --   CALCIUM 9.3  --  8.7* 9.0 8.9  --   --    < > = values in this interval not displayed.   GFR: Estimated Creatinine Clearance: 34.7 mL/min (A) (by C-G formula based on SCr of 1.6 mg/dL (H)). Liver Function Tests: Recent Labs  Lab 03/14/20 1123  AST 44*  ALT 35  ALKPHOS 66  BILITOT 1.0  PROT 6.8  ALBUMIN 3.8   No results for input(s): LIPASE, AMYLASE in the last 168 hours. No results for input(s): AMMONIA in the last 168 hours. Coagulation Profile: Recent Labs  Lab 03/16/20 0433  INR 1.3*   Cardiac Enzymes: No results for input(s): CKTOTAL, CKMB, CKMBINDEX, TROPONINI in the last 168 hours. BNP (last 3 results) No results for input(s): PROBNP in the last 8760 hours. HbA1C: No results for input(s): HGBA1C in the last 72 hours. CBG: No results for input(s): GLUCAP in the last 168 hours. Lipid Profile: Recent Labs    03/16/20 0433  CHOL 148  HDL 39*  LDLCALC 85  TRIG 875  CHOLHDL 3.8   Thyroid Function Tests: No results for input(s): TSH, T4TOTAL, FREET4, T3FREE, THYROIDAB in the last 72 hours. Anemia Panel: No results for input(s): VITAMINB12, FOLATE, FERRITIN, TIBC, IRON, RETICCTPCT in the last 72 hours. Sepsis Labs: No results for input(s): PROCALCITON, LATICACIDVEN in the last 168 hours.  Recent Results (from the past 240 hour(s))  SARS Coronavirus 2 by RT PCR (hospital order, performed in [facility] hospital lab) Nasopharyngeal Nasopharyngeal Swab     Status: None   Collection Time: 03/14/20 11:36 AM   Specimen: Nasopharyngeal Swab  Result Value Ref Range Status   SARS Coronavirus 2 NEGATIVE NEGATIVE Final    Comment: (NOTE) SARS-CoV-2 target  nucleic acids are NOT DETECTED.  The SARS-CoV-2 RNA is generally detectable in upper and lower respiratory specimens during the acute phase of infection. The lowest concentration of SARS-CoV-2 viral copies this assay can detect is 250 copies / mL. A negative result does not preclude SARS-CoV-2 infection and should not be used as the sole basis for treatment or other patient management decisions.  A negative result may occur with improper specimen collection / handling, submission of specimen other than nasopharyngeal swab, presence of viral mutation(s) within the areas targeted by this assay, and inadequate number of viral copies (<250 copies / mL). A negative result must be combined with clinical observations, patient history, and epidemiological information.  Fact Sheet for Patients:   BoilerBrush.com.cy  Fact Sheet for Healthcare Providers: https://pope.com/  This test is not yet approved or  cleared by the [place] FDA and has been authorized for detection and/or diagnosis of SARS-CoV-2 by FDA under an Emergency Use Authorization (EUA).  This EUA will remain in effect (meaning this test can be used) for the duration of the COVID-19 declaration under Section 564(b)(1) of the Act, 21 U.S.C. section 360bbb-3(b)(1), unless the authorization is terminated or revoked sooner.  Performed at [facility], [address] [place], [place] [address]   Surgical PCR screen     Status: None   Collection Time: 03/17/20 12:00 PM   Specimen: Nasal Mucosa; Nasal Swab  Result Value Ref Range Status   MRSA, PCR NEGATIVE NEGATIVE Final   Staphylococcus aureus NEGATIVE NEGATIVE Final    Comment: (NOTE) The Xpert SA Assay (FDA approved for NASAL specimens in patients 85 years of age and older), is one component of a comprehensive surveillance program.  It is not intended to diagnose infection nor to guide or monitor treatment. Performed  at [facility], [address], [place], [place] [address]      Radiology Studies: CARDIAC CATHETERIZATION  Result Date: 03/17/2020  LIMA and is small.  The graft exhibits no disease.  There is competitive flow.  Prox LAD to Mid LAD lesion is 20% stenosed.  1.  Mild nonobstructive coronary artery disease.  LIMA to LAD is patent.  However, the native LAD has normal antegrade flow and thus there is competitive flow for the LIMA flow. 2.  Left ventricular angiography was not performed due to chronic kidney disease. 3.  Right heart catheterization showed moderately elevated filling pressures, moderate pulmonary hypertension and normal cardiac output.  Prominent V waves on pulmonary capillary wedge pressure (39 mmHg) suggestive of significant mitral regurgitation. 4.  Minimal aortic valve gradient with peak to peak systolic gradient less than 10 mmHg. Recommendations: No ischemic etiology for high-grade AV block.  Proceed with pacemaker placement as planned. Recommend evaluation of mitral valve with an echocardiogram. Consider gentle diuresis given elevated filling pressures.    Scheduled Meds: . aspirin EC  81 mg Oral QHS  . cholecalciferol  2,000 Units Oral Daily  . levothyroxine  12.5 mcg Oral Q0600  . multivitamin with minerals  1 tablet Oral Daily  . pantoprazole  40 mg Oral BID  . sodium chloride flush  3 mL Intravenous Q12H  . sodium chloride flush  3 mL Intravenous Q12H  . sodium chloride flush  3 mL Intravenous Q12H   Continuous Infusions: . sodium chloride 50 mL/hr at 03/17/20 0938  . sodium chloride    . sodium chloride    . [START ON 03/18/2020] vancomycin       LOS: 2 days   Erick Blinks, MD [facility] Pager On Amion  If 7PM-7AM, please contact night-coverage 03/17/2020, 7:51 PM

## 2020-03-17 NOTE — Progress Notes (Signed)
Pt began complaining of midsternal chest pain   Around the same time noted increasing ectopy and oversensing of [place] Echo>> small effusion   BP 160-170 No rub O2 Sat 100%  Presumed microperforation of prob the RV lead.  Will plan lead repositioning

## 2020-03-17 NOTE — Progress Notes (Addendum)
Electrophysiology Rounding Note  Patient Name: Samantha Short Date of Encounter: 03/17/2020  Primary Cardiologist: Verne Carrow, MD Electrophysiologist: New  Subjective   The patient is doing well this am with no new complaints. Denies lightheadedness or dizziness in room.    Inpatient Medications    Scheduled Meds: . aspirin EC  81 mg Oral QHS  . cholecalciferol  2,000 Units Oral Daily  . levothyroxine  12.5 mcg Oral Q0600  . multivitamin with minerals  1 tablet Oral Daily  . pantoprazole  40 mg Oral Daily  . sodium chloride flush  3 mL Intravenous Q12H  . sodium chloride flush  3 mL Intravenous Q12H   Continuous Infusions: . sodium chloride    . sodium chloride    . sodium chloride 50 mL/hr at 03/17/20 0938  . sodium chloride 1 mL/kg/hr (03/16/20 2026)   PRN Meds: sodium chloride, sodium chloride, acetaminophen **OR** acetaminophen, albuterol, ondansetron **OR** ondansetron (ZOFRAN) IV, sodium chloride flush, sodium chloride flush   Vital Signs    Vitals:   03/16/20 1052 03/16/20 1240 03/16/20 1930 03/17/20 0459  BP: 128/61 (!) 134/47 (!) 151/60 (!) 142/51  Pulse: (!) 45 (!) 45 (!) 48 (!) 44  Resp:  16 18 20   Temp: 98.2 F (36.8 C) (!) 97.3 F (36.3 C) 97.7 F (36.5 C) 97.6 F (36.4 C)  TempSrc: Oral Oral Oral Oral  SpO2: 98% 100% 100% 100%  Weight:    100.2 kg  Height:        Intake/Output Summary (Last 24 hours) at 03/17/2020 0946 Last data filed at 03/17/2020 0656 Gross per 24 hour  Intake 1799.68 ml  Output 300 ml  Net 1499.68 ml   Filed Weights   03/15/20 0348 03/16/20 0343 03/17/20 0459  Weight: 98.6 kg 98.7 kg 100.2 kg    Physical Exam    GEN- The patient is well appearing, alert and oriented x 3 today.   Head- normocephalic, atraumatic Eyes-  Sclera clear, conjunctiva pink Ears- hearing intact Oropharynx- clear Neck- supple Lungs- Clear to ausculation bilaterally, normal work of breathing Heart- Slow rate and regular rhythm, no  murmurs, rubs or gallops GI- soft, NT, ND, + BS Extremities- no clubbing, cyanosis, or edema Skin- no rash or lesion Psych- euthymic mood, full affect Neuro- strength and sensation are intact  Labs    CBC Recent Labs    03/14/20 1123 03/15/20 0154 03/16/20 0433 03/17/20 0714  WBC 21.1*   < > 11.6* 8.9  NEUTROABS 16.9*  --   --   --   HGB 12.6   < > 12.1 11.3*  HCT 40.5   < > 37.0 35.6*  MCV 96.2   < > 94.9 94.9  PLT 292   < > 250 251   < > = values in this interval not displayed.   Basic Metabolic Panel Recent Labs    05/18/20 0433 03/17/20 0714  NA 140 140  K 4.0 4.4  CL 107 109  CO2 23 20*  GLUCOSE 104* 106*  BUN 38* 35*  CREATININE 1.84* 1.60*  CALCIUM 9.0 8.9   Liver Function Tests Recent Labs    03/14/20 1123  AST 44*  ALT 35  ALKPHOS 66  BILITOT 1.0  PROT 6.8  ALBUMIN 3.8   No results for input(s): LIPASE, AMYLASE in the last 72 hours. Cardiac Enzymes No results for input(s): CKTOTAL, CKMB, CKMBINDEX, TROPONINI in the last 72 hours.   Telemetry    Predominantly 2:1 AVB in the 40s (  personally reviewed)  Radiology    No results found.  Patient Profile     Samantha Short is a 74 y.o. female with a past medical history significant for CAD s/p CABG, COPD, Chronic diastolic CHF, and CKD III.  she was admitted for progressive dyspnea. EP asked to see due to high-grade second-degree HB.   Assessment & Plan    1. High grade AV block with 2:1 AV block and transient CHB Hemodynamically stable. Creatinine improving with gentle hydration.  Plan for PPM this afternoon pending cMRI and cath.   2. CAD s/p CABG Off BB with above. Can resume once paced.  Discussed with Dr. Kirke Corin. OK to proceed today with Cr trending back down.  Discussed cMRI with Dr. Graciela Husbands, Dr. Kirke Corin, and Dr. Shirlee Latch. New protocols with no concern re: renal function. Discussed with MRI and will be able to proceed with cMRI prior to cath. Discussed with patient who is in agreement.    3. AKI on CKD III 1.28 -> 1.49 -> 1.63 -> 1.84 -> 1.60 Continue gentle hydration -> increased hydration with NPO status and potential PPM today + cath contrast.   All above discussed with Dr. Graciela Husbands.    For questions or updates, please contact [facility] Please consult [email or web address] for contact info under Cardiology/STEMI.  Signed, Graciella Freer, PA-C  03/17/2020, 9:46 AM   As above  Looking for unifying/underlying cause Will plan pacing following cMRI and if cath is neg  The benefits and risks were reviewed including but not limited to death,  perforation, infection, lead dislodgement and device malfunction.  The patient understands agrees and is willing to proceed.

## 2020-03-18 ENCOUNTER — Inpatient Hospital Stay (HOSPITAL_COMMUNITY): Payer: Medicare HMO

## 2020-03-18 ENCOUNTER — Encounter (HOSPITAL_COMMUNITY): Payer: Self-pay | Admitting: Internal Medicine

## 2020-03-18 DIAGNOSIS — I441 Atrioventricular block, second degree: Secondary | ICD-10-CM

## 2020-03-18 DIAGNOSIS — I350 Nonrheumatic aortic (valve) stenosis: Secondary | ICD-10-CM

## 2020-03-18 DIAGNOSIS — N1832 Chronic kidney disease, stage 3b: Secondary | ICD-10-CM

## 2020-03-18 DIAGNOSIS — I340 Nonrheumatic mitral (valve) insufficiency: Secondary | ICD-10-CM

## 2020-03-18 LAB — BASIC METABOLIC PANEL
Anion gap: 9 (ref 5–15)
BUN: 22 mg/dL (ref 8–23)
CO2: 20 mmol/L — ABNORMAL LOW (ref 22–32)
Calcium: 9.2 mg/dL (ref 8.9–10.3)
Chloride: 113 mmol/L — ABNORMAL HIGH (ref 98–111)
Creatinine, Ser: 1.33 mg/dL — ABNORMAL HIGH (ref 0.44–1.00)
GFR calc Af Amer: 46 mL/min — ABNORMAL LOW (ref >60)
GFR calc non Af Amer: 40 mL/min — ABNORMAL LOW (ref >60)
Glucose, Bld: 96 mg/dL (ref 70–99)
Potassium: 4.2 mmol/L (ref 3.5–5.1)
Sodium: 142 mmol/L (ref 135–145)

## 2020-03-18 LAB — ECHOCARDIOGRAM COMPLETE
Height: 62 in
Weight: 3566.4 oz

## 2020-03-18 LAB — CBC
HCT: 36.9 % (ref 36.0–46.0)
Hemoglobin: 11.5 g/dL — ABNORMAL LOW (ref 12.0–15.0)
MCH: 30.8 pg (ref 26.0–34.0)
MCHC: 31.2 g/dL (ref 30.0–36.0)
MCV: 98.9 fL (ref 80.0–100.0)
Platelets: 227 10*3/uL (ref 150–400)
RBC: 3.73 MIL/uL — ABNORMAL LOW (ref 3.87–5.11)
RDW: 13.2 % (ref 11.5–15.5)
WBC: 11.1 10*3/uL — ABNORMAL HIGH (ref 4.0–10.5)
nRBC: 0 % (ref 0.0–0.2)

## 2020-03-18 MED ORDER — LISINOPRIL 10 MG PO TABS: 10.0000 mg | ORAL_TABLET | Freq: Every day | ORAL | 0 refills | Status: DC

## 2020-03-18 MED ORDER — FUROSEMIDE 20 MG PO TABS
20.0000 mg | ORAL_TABLET | Freq: Every day | ORAL | Status: DC
  Administered 2020-03-18: 20 mg via ORAL
  Filled 2020-03-18: qty 1

## 2020-03-18 MED ORDER — LISINOPRIL 10 MG PO TABS
10.0000 mg | ORAL_TABLET | Freq: Every day | ORAL | Status: DC
  Administered 2020-03-18: 10 mg via ORAL
  Filled 2020-03-18: qty 1

## 2020-03-18 NOTE — Progress Notes (Addendum)
Electrophysiology Rounding Note  Patient Name: Samantha Short Date of Encounter: 03/18/2020  Primary Cardiologist: Verne Carrow, MD Electrophysiologist: New to Dr. Graciela Husbands   Subjective   The patient is doing well today.  At this time, the patient denies chest pain, shortness of breath, or any new concerns.  Inpatient Medications    Scheduled Meds:  aspirin EC  81 mg Oral QHS   cholecalciferol  2,000 Units Oral Daily   furosemide  20 mg Oral Daily   levothyroxine  12.5 mcg Oral Q0600   lisinopril  10 mg Oral Daily   multivitamin with minerals  1 tablet Oral Daily   pantoprazole  40 mg Oral BID   sodium chloride flush  3 mL Intravenous Q12H   sodium chloride flush  3 mL Intravenous Q12H   sodium chloride flush  3 mL Intravenous Q12H   Continuous Infusions:  sodium chloride 50 mL/hr at 03/17/20 0938   sodium chloride     PRN Meds: sodium chloride, acetaminophen **OR** acetaminophen, acetaminophen, albuterol, melatonin, ondansetron **OR** ondansetron (ZOFRAN) IV, ondansetron (ZOFRAN) IV, sodium chloride flush   Vital Signs    Vitals:   03/17/20 1819 03/17/20 1943 03/18/20 0322 03/18/20 0323  BP: (!) 148/73 (!) 155/73 (!) 154/85   Pulse: 87 86 81   Resp: 14 19 18    Temp: 97.8 F (36.6 C) 98.1 F (36.7 C) 97.9 F (36.6 C)   TempSrc: Oral Oral Oral   SpO2: 100% 100% 100%   Weight:    101.1 kg  Height:        Intake/Output Summary (Last 24 hours) at 03/18/2020 0943 Last data filed at 03/18/2020 0848 Gross per 24 hour  Intake 1970.33 ml  Output 1350 ml  Net 620.33 ml   Filed Weights   03/16/20 0343 03/17/20 0459 03/18/20 0323  Weight: 98.7 kg 100.2 kg 101.1 kg    Physical Exam    GEN- The patient is well appearing, alert and oriented x 3 today.   Head- normocephalic, atraumatic Eyes-  Sclera clear, conjunctiva pink Ears- hearing intact Oropharynx- clear Neck- supple Lungs- Clear to ausculation bilaterally, normal work of breathing Heart- Regular  rate and rhythm, no murmurs, rubs or gallops. PPM site stable GI- soft, NT, ND, + BS Extremities- no clubbing, cyanosis, or edema Skin- no rash or lesion Psych- euthymic mood, full affect Neuro- strength and sensation are intact  Labs    CBC Recent Labs    03/17/20 0714 03/17/20 1401 03/17/20 1402 03/18/20 0510  WBC 8.9  --   --  11.1*  HGB 11.3*   < > 10.9* 11.5*  HCT 35.6*   < > 32.0* 36.9  MCV 94.9  --   --  98.9  PLT 251  --   --  227   < > = values in this interval not displayed.   Basic Metabolic Panel Recent Labs    05/19/20 0714 03/17/20 1401 03/17/20 1402 03/18/20 0510  NA 140   < > 137 142  K 4.4   < > 4.0 4.2  CL 109  --   --  113*  CO2 20*  --   --  20*  GLUCOSE 106*  --   --  96  BUN 35*  --   --  22  CREATININE 1.60*  --   --  1.33*  CALCIUM 8.9  --   --  9.2   < > = values in this interval not displayed.   Liver Function Tests  No results for input(s): AST, ALT, ALKPHOS, BILITOT, PROT, ALBUMIN in the last 72 hours. No results for input(s): LIPASE, AMYLASE in the last 72 hours. Cardiac Enzymes No results for input(s): CKTOTAL, CKMB, CKMBINDEX, TROPONINI in the last 72 hours.   Telemetry    VP 80-90s (personally reviewed)  Radiology    DG Chest 2 View  Result Date: 03/18/2020 CLINICAL DATA:  Cardiac device in situ. EXAM: CHEST - 2 VIEW COMPARISON:  CT 03/14/2020.  Chest x-ray 03/14/2020. FINDINGS: Cardiac pacer noted with lead tip over the right atrium and right ventricle. Prior median sternotomy. Heart size normal. Low lung volumes with bibasilar atelectasis. Calcified pulmonary nodules again noted consistent prior granulomas disease. IMPRESSION: 1. Cardiac pacer with lead tips in right atrium right ventricle. Prior median sternotomy. Heart size normal. No pneumothorax. 2. Low lung volumes with bibasilar atelectasis. Calcified pulmonary nodules again noted consistent with prior granulomas disease. Electronically Signed   By: Maisie Fus  Register   On:  03/18/2020 07:46   CARDIAC CATHETERIZATION  Result Date: 03/17/2020  LIMA and is small.  The graft exhibits no disease.  There is competitive flow.  Prox LAD to Mid LAD lesion is 20% stenosed.  1.  Mild nonobstructive coronary artery disease.  LIMA to LAD is patent.  However, the native LAD has normal antegrade flow and thus there is competitive flow for the LIMA flow. 2.  Left ventricular angiography was not performed due to chronic kidney disease. 3.  Right heart catheterization showed moderately elevated filling pressures, moderate pulmonary hypertension and normal cardiac output.  Prominent V waves on pulmonary capillary wedge pressure (39 mmHg) suggestive of significant mitral regurgitation. 4.  Minimal aortic valve gradient with peak to peak systolic gradient less than 10 mmHg. Recommendations: No ischemic etiology for high-grade AV block.  Proceed with pacemaker placement as planned. Recommend evaluation of mitral valve with an echocardiogram. Consider gentle diuresis given elevated filling pressures.   MR CARDIAC MORPHOLOGY W WO CONTRAST  Result Date: 03/17/2020 CLINICAL DATA:  Evaluate for amyloidosis EXAM: CARDIAC MRI TECHNIQUE: The patient was scanned on a 1.5 Tesla Siemens magnet. A dedicated cardiac coil was used. Functional imaging was done using Fiesta sequences. 2,3, and 4 chamber views were done to assess for RWMA's. Modified Simpson's rule using a short axis stack was used to calculate an ejection fraction on a dedicated work Research officer, trade [employer]. The patient received 10 cc of Gadavist. After 10 minutes inversion recovery sequences were used to assess for infiltration and scar tissue. CONTRAST:  10 cc  of Gadavist FINDINGS: Left ventricle: -Mild asymmetric hypertrophy measuring up to 20mm in basal anteroseptum (10mm in posterior wall) -Normal size -Hyperdynamic systolic function -Normal ECV (28%) -RV insertion site LGE LV EF:  79% (Normal 56-78%) Absolute volumes: LV EDV:  (Normal 52-141 mL) LV ESV: 62mL (Normal 13-51 mL) LV SV: (Normal 33-97 mL) CO: 5.4L/min (Normal 2.7-6.0 L/min) Indexed volumes: LV EDV: 37mL/sq-m (Normal 41-81 mL/sq-m) LV ESV: 13mL/sq-m (Normal 12-21 mL/sq-m) LV SV: 5mL/sq-m (Normal 26-56 mL/sq-m) CI: 2.6L/min/sq-m (Normal 1.8-3.8 L/min/sq-m) Right ventricle: Normal size and hyperdynamic systolic function RV EF: 78% (Normal 47-80%) Absolute volumes: RV EDV: (Normal 58-154 mL) RV ESV: 49mL (Normal 12-68 mL) RV SV: (Normal 35-98 mL) CO: 5.3L/min (Normal 2.7-6 L/min) Indexed volumes: RV EDV: 60mL/sq-m (Normal 48-87 mL/sq-m) RV ESV: 69mL/sq-m (Normal 11-28 mL/sq-m) RV SV: 27mL/sq-m (Normal 27-57 mL/sq-m) CI: 2.5L/min/sq-m (Normal 1.8-3.8 L/min/sq-m) Left atrium: Moderate enlargement Right atrium: Normal size Mitral valve: Trivial regurgitation Aortic valve:  Tricuspid. No regurgitation. Aortic stenosis is present but gradient not measured Tricuspid valve: No regurgitation Pulmonic valve: No regurgitation Aorta: Normal proximal ascending aorta Pericardium: Normal IMPRESSION: 1.  No evidence of cardiac amyloidosis 2. Normal LV size, mild hypertrophy, and hyperdynamic systolic function (EF 79%) 3.  Normal RV size and hyperdynamic systolic function (EF 78%) 4. RV insertion site late gadolinium enhancement, which is a nonspecific finding often seen in setting of elevated pulmonary pressures Electronically Signed   By: Epifanio Lesches MD   On: 03/17/2020 20:30    Patient Profile     AVANGELINA FLIGHT is a 73 y.o. female with a past medical history significant for CAD s/p CABG, COPD, Chronic diastolic CHF, and CKD III.  she was admitted for progressive dyspnea. EP asked to see due to high-grade second-degree HB.   Assessment & Plan    1. High grade AV block with 2:1 AV block and transient CHB S/p Medtronic dual chamber pacer 7/7; complicated by micro perforation requiring lead revision Stable today with appropriate pacing overnight.  Plan  repeat echo today, and discharge if stable.  cMRI 03/17/2020 showed no evidence of cardiac amyloidosis but with RV insertion site late gadolinium enhancement, non specific finding.  CXR today without pneumothorax.   2. CAD s/p CABG Could consider resuming BB as tolerating. Per general cards.  Denies ischemic symptoms.   3. AKI on CKD III 1.28 -> 1.49 -> 1.63 -> 1.84 -> 1.60 -> 1.33   Will place usual wound check and follow up in chart. OK for home pending repeat echo   For questions or updates, please contact [facility] Please consult [email or web address] for contact info under Cardiology/STEMI.  Signed, Graciella Freer, PA-C  03/18/2020, 9:43 AM   Micro perforation  requriing revision  No pain this am  Device function normal and CXR with stable lead position

## 2020-03-18 NOTE — Progress Notes (Signed)
  Echocardiogram 2D Echocardiogram has been performed.  Leta Jungling M 03/18/2020, 10:49 AM

## 2020-03-18 NOTE — Discharge Instructions (Signed)
After Your Pacemaker   . You have a Medtronic Pacemaker  ACTIVITY . Do not lift your arm above shoulder height for 1 week after your procedure. After 7 days, you may progress as below.     Thursday March 25, 2020  Friday March 26, 2020 Saturday March 27, 2020 Sunday March 28, 2020   . Do not lift, push, pull, or carry anything over 10 pounds with the affected arm until 6 weeks (Thursday April 29, 2020 ) after your procedure.   . Do NOT DRIVE until you have been seen for your wound check, or as long as instructed by your healthcare provider.   . Ask your healthcare provider when you can go back to work   INCISION/Dressing . If you are on a blood thinner such as Coumadin, Xarelto, Eliquis, Plavix, or Pradaxa please confirm with your provider when this should be resumed.   . Monitor your Pacemaker site for redness, swelling, and drainage. Call the device clinic at [phone] if you experience these symptoms or fever/chills.  . If your incision is closed with Dermabond/Surgical glue. You may shower 1 day after your pacemaker implant and wash around the site with soap and water.    Marland Kitchen Avoid lotions, ointments, or perfumes over your incision until it is well-healed.  . You may use a hot tub or a pool AFTER your wound check appointment if the incision is completely closed.  Marland Kitchen PAcemaker Alerts:  Some alerts are vibratory and others beep. These are NOT emergencies. Please call our office to let us know. If this occurs at night or on weekends, it can wait until the next business day. Send a remote transmission.  . If your device is capable of reading fluid status (for heart failure), you will be offered monthly monitoring to review this with you.   DEVICE MANAGEMENT . Remote monitoring is used to monitor your pacemaker from home. This monitoring is scheduled every 91 days by our office. It allows [place] to keep an eye on the functioning of your device to ensure it is working properly. You will  routinely see your Electrophysiologist annually (more often if necessary).   . You should receive your ID card for your new device in 4-8 weeks. Keep this card with you at all times once received. Consider wearing a medical alert bracelet or necklace.  . Your Pacemaker may be MRI compatible. This will be discussed at your next office visit/wound check.  You should avoid contact with strong electric or magnetic fields.    Do not use amateur (ham) radio equipment or electric (arc) welding torches. MP3 player headphones with magnets should not be used. Some devices are safe to use if held at least 12 inches (30 cm) from your Pacemaker. These include power tools, lawn mowers, and speakers. If you are unsure if something is safe to use, ask your health care provider.   When using your cell phone, hold it to the ear that is on the opposite side from the Pacemaker. Do not leave your cell phone in a pocket over the Pacemaker.   You may safely use electric blankets, heating pads, computers, and microwave ovens.  Call the office right away if:  You have chest pain.  You feel more short of breath than you have felt before.  You feel more light-headed than you have felt before.  Your incision starts to open up.  This information is not intended to replace advice given to you by your health care  provider. Make sure you discuss any questions you have with your health care provider.

## 2020-03-18 NOTE — Progress Notes (Addendum)
Progress Note  Patient Name: Samantha Short Date of Encounter: 03/18/2020  [facility] Cardiologist: Verne Carrow, MD   Subjective   She feels much better today. No dyspnea.   Inpatient Medications    Scheduled Meds: . aspirin EC  81 mg Oral QHS  . cholecalciferol  2,000 Units Oral Daily  . levothyroxine  12.5 mcg Oral Q0600  . multivitamin with minerals  1 tablet Oral Daily  . pantoprazole  40 mg Oral BID  . sodium chloride flush  3 mL Intravenous Q12H  . sodium chloride flush  3 mL Intravenous Q12H  . sodium chloride flush  3 mL Intravenous Q12H   Continuous Infusions: . sodium chloride 50 mL/hr at 03/17/20 0938  . sodium chloride     PRN Meds: sodium chloride, acetaminophen **OR** acetaminophen, acetaminophen, albuterol, melatonin, ondansetron **OR** ondansetron (ZOFRAN) IV, ondansetron (ZOFRAN) IV, sodium chloride flush   Vital Signs    Vitals:   03/17/20 1819 03/17/20 1943 03/18/20 0322 03/18/20 0323  BP: (!) 148/73 (!) 155/73 (!) 154/85   Pulse: 87 86 81   Resp: 14 19 18    Temp: 97.8 F (36.6 C) 98.1 F (36.7 C) 97.9 F (36.6 C)   TempSrc: Oral Oral Oral   SpO2: 100% 100% 100%   Weight:    101.1 kg  Height:        Intake/Output Summary (Last 24 hours) at 03/18/2020 0916 Last data filed at 03/18/2020 0848 Gross per 24 hour  Intake 1970.33 ml  Output 1350 ml  Net 620.33 ml   Last 3 Weights 03/18/2020 03/17/2020 03/16/2020  Weight (lbs) 222 lb 14.4 oz 220 lb 12.8 oz 217 lb 8 oz  Weight (kg) 101.107 kg 100.154 kg 98.657 kg      Telemetry    2-1 AV block heart rate 49 bpm, brief transient third-degree AV block- Personally Reviewed  ECG    2-1 AV block- Personally Reviewed  Physical Exam   GEN: No acute distress.   Neck: No JVD Cardiac: Bradycardic regular, 2/6 SM, no rubs, or gallops.  Respiratory: Clear to auscultation bilaterally. GI: Soft, nontender, non-distended  MS: No edema; No deformity. Neuro:  Nonfocal  Psych: Normal affect    Labs    High Sensitivity Troponin:   Recent Labs  Lab 03/14/20 1123 03/14/20 1314 03/14/20 1946  TROPONINIHS 43* 58* 57*      Chemistry Recent Labs  Lab 03/14/20 1123 03/15/20 0154 03/16/20 0433 03/16/20 0433 03/17/20 0714 03/17/20 0714 03/17/20 1401 03/17/20 1402 03/18/20 0510  NA 143   < > 140   < > 140   < > 145 137 142  K 4.4   < > 4.0   < > 4.4   < > 4.3 4.0 4.2  CL 112*   < > 107  --  109  --   --   --  113*  CO2 18*   < > 23  --  20*  --   --   --  20*  GLUCOSE 100*   < > 104*  --  106*  --   --   --  96  BUN 37*   < > 38*  --  35*  --   --   --  22  CREATININE 1.49*   < > 1.84*  --  1.60*  --   --   --  1.33*  CALCIUM 9.3   < > 9.0  --  8.9  --   --   --  9.2  PROT 6.8  --   --   --   --   --   --   --   --   ALBUMIN 3.8  --   --   --   --   --   --   --   --   AST 44*  --   --   --   --   --   --   --   --   ALT 35  --   --   --   --   --   --   --   --   ALKPHOS 66  --   --   --   --   --   --   --   --   BILITOT 1.0  --   --   --   --   --   --   --   --   GFRNONAA 34*   < > 27*  --  32*  --   --   --  40*  GFRAA 40*   < > 31*  --  37*  --   --   --  46*  ANIONGAP 13   < > 10  --  11  --   --   --  9   < > = values in this interval not displayed.     Hematology Recent Labs  Lab 03/16/20 0433 03/16/20 0433 03/17/20 0714 03/17/20 0714 03/17/20 1401 03/17/20 1402 03/18/20 0510  WBC 11.6*  --  8.9  --   --   --  11.1*  RBC 3.90  --  3.75*  --   --   --  3.73*  HGB 12.1   < > 11.3*   < > 11.2* 10.9* 11.5*  HCT 37.0   < > 35.6*   < > 33.0* 32.0* 36.9  MCV 94.9  --  94.9  --   --   --  98.9  MCH 31.0  --  30.1  --   --   --  30.8  MCHC 32.7  --  31.7  --   --   --  31.2  RDW 13.1  --  13.0  --   --   --  13.2  PLT 250  --  251  --   --   --  227   < > = values in this interval not displayed.    BNP Recent Labs  Lab 03/14/20 1123 03/15/20 0154  BNP 883.2* 563.7*     DDimer No results for input(s): DDIMER in the last 168 hours.    Radiology    DG Chest 2 View  Result Date: 03/18/2020 CLINICAL DATA:  Cardiac device in situ. EXAM: CHEST - 2 VIEW COMPARISON:  CT 03/14/2020.  Chest x-ray 03/14/2020. FINDINGS: Cardiac pacer noted with lead tip over the right atrium and right ventricle. Prior median sternotomy. Heart size normal. Low lung volumes with bibasilar atelectasis. Calcified pulmonary nodules again noted consistent prior granulomas disease. IMPRESSION: 1. Cardiac pacer with lead tips in right atrium right ventricle. Prior median sternotomy. Heart size normal. No pneumothorax. 2. Low lung volumes with bibasilar atelectasis. Calcified pulmonary nodules again noted consistent with prior granulomas disease. Electronically Signed   By: Maisie Fus  Register   On: 03/18/2020 07:46   CARDIAC CATHETERIZATION  Result Date: 03/17/2020  LIMA and is small.  The graft exhibits no disease.  There is competitive flow.  Prox  LAD to Mid LAD lesion is 20% stenosed.  1.  Mild nonobstructive coronary artery disease.  LIMA to LAD is patent.  However, the native LAD has normal antegrade flow and thus there is competitive flow for the LIMA flow. 2.  Left ventricular angiography was not performed due to chronic kidney disease. 3.  Right heart catheterization showed moderately elevated filling pressures, moderate pulmonary hypertension and normal cardiac output.  Prominent V waves on pulmonary capillary wedge pressure (39 mmHg) suggestive of significant mitral regurgitation. 4.  Minimal aortic valve gradient with peak to peak systolic gradient less than 10 mmHg. Recommendations: No ischemic etiology for high-grade AV block.  Proceed with pacemaker placement as planned. Recommend evaluation of mitral valve with an echocardiogram. Consider gentle diuresis given elevated filling pressures.   MR CARDIAC MORPHOLOGY W WO CONTRAST  Result Date: 03/17/2020 CLINICAL DATA:  Evaluate for amyloidosis EXAM: CARDIAC MRI TECHNIQUE: The patient was scanned on a 1.5  Tesla Siemens magnet. A dedicated cardiac coil was used. Functional imaging was done using Fiesta sequences. 2,3, and 4 chamber views were done to assess for RWMA's. Modified Simpson's rule using a short axis stack was used to calculate an ejection fraction on a dedicated work Research officer, trade unionstation using Circle software. The patient received 10 cc of Gadavist. After 10 minutes inversion recovery sequences were used to assess for infiltration and scar tissue. CONTRAST:  10 cc  of Gadavist FINDINGS: Left ventricle: -Mild asymmetric hypertrophy measuring up to 10mm in basal anteroseptum (6mm in posterior wall) -Normal size -Hyperdynamic systolic function -Normal ECV (28%) -RV insertion site LGE LV EF:  79% (Normal 56-78%) Absolute volumes: LV EDV: 133mL (Normal 52-141 mL) LV ESV: 28mL (Normal 13-51 mL) LV SV: 106mL (Normal 33-97 mL) CO: 5.4L/min (Normal 2.7-6.0 L/min) Indexed volumes: LV EDV: 5964mL/sq-m (Normal 41-81 mL/sq-m) LV ESV: 3513mL/sq-m (Normal 12-21 mL/sq-m) LV SV: 3950mL/sq-m (Normal 26-56 mL/sq-m) CI: 2.6L/min/sq-m (Normal 1.8-3.8 L/min/sq-m) Right ventricle: Normal size and hyperdynamic systolic function RV EF: 78% (Normal 47-80%) Absolute volumes: RV EDV: 133mL (Normal 58-154 mL) RV ESV: 30mL (Normal 12-68 mL) RV SV: 103mL (Normal 35-98 mL) CO: 5.3L/min (Normal 2.7-6 L/min) Indexed volumes: RV EDV: 8363mL/sq-m (Normal 48-87 mL/sq-m) RV ESV: 5214mL/sq-m (Normal 11-28 mL/sq-m) RV SV: 7049mL/sq-m (Normal 27-57 mL/sq-m) CI: 2.5L/min/sq-m (Normal 1.8-3.8 L/min/sq-m) Left atrium: Moderate enlargement Right atrium: Normal size Mitral valve: Trivial regurgitation Aortic valve: Tricuspid. No regurgitation. Aortic stenosis is present but gradient not measured Tricuspid valve: No regurgitation Pulmonic valve: No regurgitation Aorta: Normal proximal ascending aorta Pericardium: Normal IMPRESSION: 1.  No evidence of cardiac amyloidosis 2. Normal LV size, mild hypertrophy, and hyperdynamic systolic function (EF 79%) 3.  Normal RV size and  hyperdynamic systolic function (EF 78%) 4. RV insertion site late gadolinium enhancement, which is a nonspecific finding often seen in setting of elevated pulmonary pressures Electronically Signed   By: Epifanio Lescheshristopher  Schumann MD   On: 03/17/2020 20:30    Cardiac Studies   Echo EF normal with mild aortic stenosis.  01/21/2020. Trivial MR  RIGHT/LEFT HEART CATH AND CORONARY/GRAFT ANGIOGRAPHY  Conclusion    LIMA and is small.  The graft exhibits no disease.  There is competitive flow.  Prox LAD to Mid LAD lesion is 20% stenosed.   1.  Mild nonobstructive coronary artery disease.  LIMA to LAD is patent.  However, the native LAD has normal antegrade flow and thus there is competitive flow for the LIMA flow. 2.  Left ventricular angiography was not performed due to chronic kidney disease. 3.  Right heart catheterization showed moderately elevated filling pressures, moderate pulmonary hypertension and normal cardiac output.  Prominent V waves on pulmonary capillary wedge pressure (39 mmHg) suggestive of significant mitral regurgitation. 4.  Minimal aortic valve gradient with peak to peak systolic gradient less than 10 mmHg.  Recommendations: No ischemic etiology for high-grade AV block.  Proceed with pacemaker placement as planned. Recommend evaluation of mitral valve with an echocardiogram. Consider gentle diuresis given elevated filling pressures.   Patient Profile     74 y.o. female here with high degree AV block, mild to moderate aortic stenosis  Assessment & Plan    1. High degree AV block/2-1 AV block/transient third-degree AV block -EP has been consulted. S/p Pacemaker implant yesterday. Had to have lead repositioned due to microperforation. A sensed and V paced on monitor. No rub. Repeat Echo pending today. Marked improvement in symptoms.  Also with LVH on Echo may want to consider cardiac MRI or PYP scan as outpatient given concern of amyloid. Would avoid additional contrast  studies at this time.  2. CAD/CABG x 1 with LIMA to the LAD -Previously beta-blocker was stopped because of perceived symptoms by patient.  Troponins were mildly elevated nonspecific. Cardiac cath with no significant obstructive CAD  3. COPD/asthma/diastolic dysfunction -BNP mildly elevated previously.  Given findings of elevated LV filling pressures I would resume lasix at 20 mg daily. With renal function improving and BP increasing would resume lisinopril at lower dose 10 mg   4. Acute kidney injury on top of CKD stage 3.  -Creatinine in February 1.28, 1.49 on admission, then 1.63 and today 1.84 yesterday. Back to baseline now at 1.33  5. Mild aortic stenosis -Has been recently described as mild to possibly moderate.  Following clinically.  Will await EP recommendations. Possible DC later today if Echo stable.         For questions or updates, please contact [facility] Please consult [email or web address] for contact info under        Signed, Chancey Cullinane [place], MD  03/18/2020, 9:16 AM

## 2020-03-18 NOTE — Discharge Summary (Signed)
Physician Discharge Summary  Samantha Short R [place] ZOX:[id number] DOB: 30-May-1946 DOA: 03/14/2020  PCP: Mila PalmerWolters, Sharon, MD  Admit date: 03/14/2020 Discharge date: 03/18/2020  Admitted From: home  Disposition:  home   Recommendations for Outpatient Follow-up:  1. F/u on chest pain  Home Health:  noe  Discharge Condition:  stable   CODE STATUS:  Full code   Diet recommendation:  Heart healthy Consultations:  Cardiology/ EP  Procedures/Studies:   Cardiac cath  Pacemaker placement        Discharge Diagnoses:  Principal Problem:   Mobitz type 2 second degree heart block Active Problems:   Hypertension   Acid reflux   Chronic diastolic CHF (congestive heart failure) (HCC)   Hypothyroidism   AVD (aortic valve disease)     Brief Summary: 74 y.o.femalewith medical history significant ofdiastolic congestive heart failure, chronic kidney disease hyper thyroidism now hypothyroid. She comes in via EMS reporting worsening chest pain and shortness of breath over the last 2 months. Patient was found to be 88% on room air by EMS that she was placed on O2. Was seen at [facility] a few days ago and diagnosed with an asthma exacerbation and started on a prednisone taper. (Patient has not had asthma trouble since she was in her 30s).Patient states she used to be active swimming prior to Covid. She states she has gained weight due to decreased activity but also states that at she has not been able to mow her yard or walk to her mailbox recently due to shortness of breath  In the ER she had chest x-ray that was unremarkable, CTA that ruled out PE and showed small bilateral pleural effusions and a small hiatal hernia. CT also showed evidence of prior granulomatous disease. Labs showed an elevated WBC count most likely due to steroids-stable chronic kidney disease. Cardiology consult requested by ER physician  Hospital Course:  Acute respiratory failure - CAD s/p CABG -Possibly related to  2nd degree heart block  -serial trop stable around mid 50's - Cardiology and EP consulted- she underwent a cardiac cath on 7/7 which was unremarkable  LIMA and is small.  The graft exhibits no disease.  There is competitive flow.   Prox LAD to Mid LAD lesion is 20% stenosed.  This was followed by a pacemaker which unfortunately causes a microperf requiring lead revision (also done yesterday) - today she states she feels "much better". - per cardiology, she is stable for d/c today  Post ablative hypothyroidism -TSH 5.79 and free T4 1.12 -Continue Synthroid for now  Acute on Stage IIIa CKD -Follows with Dr. Glenna FellowsKruska as an outpatient -Cr slowly trending up after initially receiving contrast CT was well as IV lasix at time of admit while on ACEI at home -Agree with holding ACEI for now and gentle hydration- Cr has improved from 1.60 to 1.33 -Renal function appears to have improved with hydration   Acid reflux -Has a hiatal hernia -continue PPI BID -Seems stable  Hypothyroidism -continue on synthroid -TSH 5.7   Discharge Exam: Vitals:   03/17/20 1943 03/18/20 0322  BP: (!) 155/73 (!) 154/85  Pulse: 86 81  Resp: 19 18  Temp: 98.1 F (36.7 C) 97.9 F (36.6 C)  SpO2: 100% 100%   Vitals:   03/17/20 1819 03/17/20 1943 03/18/20 0322 03/18/20 0323  BP: (!) 148/73 (!) 155/73 (!) 154/85   Pulse: 87 86 81   Resp: 14 19 18    Temp: 97.8 F (36.6 C) 98.1 F (36.7 C) 97.9 F (  36.6 C)   TempSrc: Oral Oral Oral   SpO2: 100% 100% 100%   Weight:    101.1 kg  Height:        General: Pt is alert, awake, not in acute distress Cardiovascular: RRR, S1/S2 +, no rubs, no gallops Respiratory: CTA bilaterally, no wheezing, no rhonchi Abdominal: Soft, NT, ND, bowel sounds + Extremities: no edema, no cyanosis   Discharge Instructions  Discharge Instructions    Diet - low sodium heart healthy   Complete by: As directed    Increase activity slowly   Complete by: As  directed      Allergies as of 03/18/2020      Reactions   Ceclor [cefaclor] Other (See Comments)   Lost vision in eye   Hctz [hydrochlorothiazide] Other (See Comments)   Renal insufficiency   Lipitor [atorvastatin Calcium] Other (See Comments)   Increased A1C   Norvasc [amlodipine] Other (See Comments)   Makes patient stiff   Penicillins Hives, Shortness Of Breath, Itching, Rash   Has patient had a PCN reaction causing immediate rash, facial/tongue/throat swelling, SOB or lightheadedness with hypotension: Yes Has patient had a PCN reaction causing severe rash involving mucus membranes or skin necrosis: Unk Has patient had a PCN reaction that required hospitalization: No Has patient had a PCN reaction occurring within the last 10 years: No If all of the above answers are "NO", then may proceed with Cephalosporin use.   Sulfa Antibiotics Other (See Comments)   CAUSES SHOCK   Statins Other (See Comments)   MYALGIAS AND WEAKNESS   Erythromycin Rash   Latex Rash   Levofloxacin Rash   Tetracyclines & Related Rash      Medication List    STOP taking these medications   predniSONE 10 MG tablet Commonly known as: DELTASONE     TAKE these medications   acetaminophen 325 MG tablet Commonly known as: TYLENOL Take 325 mg by mouth every 6 (six) hours as needed for headache (pain).   albuterol 108 (90 Base) MCG/ACT inhaler Commonly known as: VENTOLIN HFA Inhale 2 puffs into the lungs every 4 (four) hours as needed for wheezing or shortness of breath (asthma).   aspirin EC 81 MG tablet Take 81 mg by mouth at bedtime. Swallow whole.   furosemide 20 MG tablet Commonly known as: LASIX Take 20 mg by mouth daily. What changed: Another medication with the same name was removed. Continue taking this medication, and follow the directions you see here.   Investigational - Study Medication Take 1 tablet by mouth daily. Unnamed study/Medication Name/alternative to statins: Bempedoic Acid  180 mg vs placebo Managed by Dr. Jens Som. Start date March 2019/ original term 3 years - extended indefinitely   levothyroxine 25 MCG tablet Commonly known as: SYNTHROID Take 1 tablet (25 mcg total) by mouth daily before breakfast. What changed: how much to take   lisinopril 10 MG tablet Commonly known as: ZESTRIL Take 1 tablet (10 mg total) by mouth daily. Start taking on: March 19, 2020 What changed:   medication strength  how much to take   multivitamin with minerals Tabs tablet Take 1 tablet by mouth daily.   omeprazole 20 MG tablet Commonly known as: PRILOSEC OTC Take 20 mg by mouth daily.   OVER THE COUNTER MEDICATION Take 1 tablet by mouth daily as needed (allergies). OTC allergy medication   PreserVision AREDS 2 Caps Take 1 capsule by mouth 2 (two) times daily.   Vitamin D 50 MCG (2000 UT) Caps  Take 2,000 Units by mouth daily.       Follow-up Information    Duke Salvia, MD Follow up on 06/22/2020.   Specialty: Cardiology Why: at 330 pm for 3 month pacemaker check Contact information: [address] [place] [address] [phone]        [facility] Follow up on 04/01/2020.   Why: at 1100 am for post pacemaker wound check Contact information: [address] [place] [address] [phone]             Allergies  Allergen Reactions   Ceclor [Cefaclor] Other (See Comments)    Lost vision in eye   Hctz [Hydrochlorothiazide] Other (See Comments)    Renal insufficiency   Lipitor [Atorvastatin Calcium] Other (See Comments)    Increased A1C   Norvasc [Amlodipine] Other (See Comments)    Makes patient stiff   Penicillins Hives, Shortness Of Breath, Itching and Rash    Has patient had a PCN reaction causing immediate rash, facial/tongue/throat swelling, SOB or lightheadedness with hypotension: Yes Has patient had a PCN reaction causing severe rash  involving mucus membranes or skin necrosis: Unk Has patient had a PCN reaction that required hospitalization: No Has patient had a PCN reaction occurring within the last 10 years: No If all of the above answers are "NO", then may proceed with Cephalosporin use.    Sulfa Antibiotics Other (See Comments)    CAUSES SHOCK   Statins Other (See Comments)    MYALGIAS AND WEAKNESS   Erythromycin Rash   Latex Rash   Levofloxacin Rash   Tetracyclines & Related Rash      DG Chest 2 View  Result Date: 03/18/2020 CLINICAL DATA:  Cardiac device in situ. EXAM: CHEST - 2 VIEW COMPARISON:  CT 03/14/2020.  Chest x-ray 03/14/2020. FINDINGS: Cardiac pacer noted with lead tip over the right atrium and right ventricle. Prior median sternotomy. Heart size normal. Low lung volumes with bibasilar atelectasis. Calcified pulmonary nodules again noted consistent prior granulomas disease. IMPRESSION: 1. Cardiac pacer with lead tips in right atrium right ventricle. Prior median sternotomy. Heart size normal. No pneumothorax. 2. Low lung volumes with bibasilar atelectasis. Calcified pulmonary nodules again noted consistent with prior granulomas disease. Electronically Signed   By: Maisie Fus  Register   On: 03/18/2020 07:46   DG Chest 2 View  Result Date: 03/14/2020 CLINICAL DATA:  Shortness of breath, chest pain EXAM: CHEST - 2 VIEW COMPARISON:  05/23/2018 FINDINGS: Status post median sternotomy. Multiple benign calcified nodules are present bilaterally. No acute appearing airspace opacity. The visualized skeletal structures are unremarkable. IMPRESSION: 1. No acute abnormality of the lungs. 2. Multiple benign calcified nodules bilaterally. Electronically Signed   By: Lauralyn Primes M.D.   On: 03/14/2020 11:41   CT Angio Chest PE W and/or Wo Contrast  Result Date: 03/14/2020 CLINICAL DATA:  Shortness of breath and chest pain worsening over the last 2 months. Abdominal distention. EXAM: CT ANGIOGRAPHY CHEST WITH CONTRAST  TECHNIQUE: Multidetector CT imaging of the chest was performed using the standard protocol during bolus administration of intravenous contrast. Multiplanar CT image reconstructions and MIPs were obtained to evaluate the vascular anatomy. CONTRAST:  58mL OMNIPAQUE IOHEXOL 350 MG/ML SOLN COMPARISON:  Chest x-ray on 03/14/2020 FINDINGS: Cardiovascular: Heart is enlarged. There is focal atherosclerotic calcification of the coronary arteries. Median sternotomy and CABG. Pulmonary arteries are well opacified and there is no acute pulmonary embolus. Mediastinum/Nodes: The visualized portion of the  thyroid gland has a normal appearance. Small hiatal hernia. Otherwise the esophagus is normal. There are multiple calcified mediastinal and hilar lymph nodes. No suspicious lymphadenopathy. Lungs/Pleura: There are small bilateral pleural effusions. Numerous calcified granulomata are identified throughout the lungs. No suspicious noncalcified mass is identified. There is minimal atelectasis at the lung bases, RIGHT greater than LEFT. Upper Abdomen: Status post cholecystectomy. Calcified granulomata identified within the liver and spleen. Musculoskeletal: Median sternotomy. Remote wedge compression fracture of T5. Review of the MIP images confirms the above findings. IMPRESSION: 1. Technically adequate exam showing no acute pulmonary embolus. 2. Cardiomegaly, coronary artery disease, and prior CABG. 3. Small bilateral pleural effusions and minimal atelectasis at the lung bases, RIGHT greater than LEFT. 4. Evidence of prior granulomatous disease. 5. Small hiatal hernia. 6. Remote wedge compression fracture of T5. 7. Aortic Atherosclerosis (ICD10-I70.0). Electronically Signed   By: Norva Pavlov M.D.   On: 03/14/2020 14:10   CARDIAC CATHETERIZATION  Result Date: 03/17/2020  LIMA and is small.  The graft exhibits no disease.  There is competitive flow.  Prox LAD to Mid LAD lesion is 20% stenosed.  1.  Mild nonobstructive  coronary artery disease.  LIMA to LAD is patent.  However, the native LAD has normal antegrade flow and thus there is competitive flow for the LIMA flow. 2.  Left ventricular angiography was not performed due to chronic kidney disease. 3.  Right heart catheterization showed moderately elevated filling pressures, moderate pulmonary hypertension and normal cardiac output.  Prominent V waves on pulmonary capillary wedge pressure (39 mmHg) suggestive of significant mitral regurgitation. 4.  Minimal aortic valve gradient with peak to peak systolic gradient less than 10 mmHg. Recommendations: No ischemic etiology for high-grade AV block.  Proceed with pacemaker placement as planned. Recommend evaluation of mitral valve with an echocardiogram. Consider gentle diuresis given elevated filling pressures.   MR CARDIAC MORPHOLOGY W WO CONTRAST  Result Date: 03/17/2020 CLINICAL DATA:  Evaluate for amyloidosis EXAM: CARDIAC MRI TECHNIQUE: The patient was scanned on a 1.5 Tesla Siemens magnet. A dedicated cardiac coil was used. Functional imaging was done using Fiesta sequences. 2,3, and 4 chamber views were done to assess for RWMA's. Modified Simpson's rule using a short axis stack was used to calculate an ejection fraction on a dedicated work [employer]. The patient received 10 cc of Gadavist. After 10 minutes inversion recovery sequences were used to assess for infiltration and scar tissue. CONTRAST:  10 cc  of Gadavist FINDINGS: Left ventricle: -Mild asymmetric hypertrophy measuring up to 10mm in basal anteroseptum (6mm in posterior wall) -Normal size -Hyperdynamic systolic function -Normal ECV (28%) -RV insertion site LGE LV EF:  79% (Normal 56-78%) Absolute volumes: LV EDV: (Normal 52-141 mL) LV ESV: 28mL (Normal 13-51 mL) LV SV: (Normal 33-97 mL) CO: 5.4L/min (Normal 2.7-6.0 L/min) Indexed volumes: LV EDV: 79mL/sq-m (Normal 41-81 mL/sq-m) LV ESV: 57mL/sq-m (Normal 12-21 mL/sq-m) LV SV:  36mL/sq-m (Normal 26-56 mL/sq-m) CI: 2.6L/min/sq-m (Normal 1.8-3.8 L/min/sq-m) Right ventricle: Normal size and hyperdynamic systolic function RV EF: 78% (Normal 47-80%) Absolute volumes: RV EDV: (Normal 58-154 mL) RV ESV: 30mL (Normal 12-68 mL) RV SV: (Normal 35-98 mL) CO: 5.3L/min (Normal 2.7-6 L/min) Indexed volumes: RV EDV: 61mL/sq-m (Normal 48-87 mL/sq-m) RV ESV: 37mL/sq-m (Normal 11-28 mL/sq-m) RV SV: 97mL/sq-m (Normal 27-57 mL/sq-m) CI: 2.5L/min/sq-m (Normal 1.8-3.8 L/min/sq-m) Left atrium: Moderate enlargement Right atrium: Normal size Mitral valve: Trivial regurgitation Aortic valve: Tricuspid. No regurgitation. Aortic stenosis is present but gradient not  measured Tricuspid valve: No regurgitation Pulmonic valve: No regurgitation Aorta: Normal proximal ascending aorta Pericardium: Normal IMPRESSION: 1.  No evidence of cardiac amyloidosis 2. Normal LV size, mild hypertrophy, and hyperdynamic systolic function (EF 79%) 3.  Normal RV size and hyperdynamic systolic function (EF 78%) 4. RV insertion site late gadolinium enhancement, which is a nonspecific finding often seen in setting of elevated pulmonary pressures Electronically Signed   By: Epifanio Lesches MD   On: 03/17/2020 20:30     The results of significant diagnostics from this hospitalization (including imaging, microbiology, ancillary and laboratory) are listed below for reference.     Microbiology: Recent Results (from the past 240 hour(s))  SARS Coronavirus 2 by RT PCR (hospital order, performed in [facility] hospital lab) Nasopharyngeal Nasopharyngeal Swab     Status: None   Collection Time: 03/14/20 11:36 AM   Specimen: Nasopharyngeal Swab  Result Value Ref Range Status   SARS Coronavirus 2 NEGATIVE NEGATIVE Final    Comment: (NOTE) SARS-CoV-2 target nucleic acids are NOT DETECTED.  The SARS-CoV-2 RNA is generally detectable in upper and lower respiratory specimens during the acute phase of infection. The  lowest concentration of SARS-CoV-2 viral copies this assay can detect is 250 copies / mL. A negative result does not preclude SARS-CoV-2 infection and should not be used as the sole basis for treatment or other patient management decisions.  A negative result may occur with improper specimen collection / handling, submission of specimen other than nasopharyngeal swab, presence of viral mutation(s) within the areas targeted by this assay, and inadequate number of viral copies (<250 copies / mL). A negative result must be combined with clinical observations, patient history, and epidemiological information.  Fact Sheet for Patients:   BoilerBrush.com.cy  Fact Sheet for Healthcare Providers: https://pope.com/  This test is not yet approved or  cleared by the [place] FDA and has been authorized for detection and/or diagnosis of SARS-CoV-2 by FDA under an Emergency Use Authorization (EUA).  This EUA will remain in effect (meaning this test can be used) for the duration of the COVID-19 declaration under Section 564(b)(1) of the Act, 21 U.S.C. section 360bbb-3(b)(1), unless the authorization is terminated or revoked sooner.  Performed at [facility], [address] [place], [place] [address]   Surgical PCR screen     Status: None   Collection Time: 03/17/20 12:00 PM   Specimen: Nasal Mucosa; Nasal Swab  Result Value Ref Range Status   MRSA, PCR NEGATIVE NEGATIVE Final   Staphylococcus aureus NEGATIVE NEGATIVE Final    Comment: (NOTE) The Xpert SA Assay (FDA approved for NASAL specimens in patients 58 years of age and older), is one component of a comprehensive surveillance program. It is not intended to diagnose infection nor to guide or monitor treatment. Performed at [facility], [address], [place], [place] [address]      Labs: BNP (last 3 results) Recent Labs    03/14/20 1123 03/15/20 0154  BNP  883.2* 563.7*   Basic Metabolic Panel: Recent Labs  Lab 03/14/20 1123 03/14/20 1123 03/15/20 0154 03/15/20 0154 03/16/20 0433 03/17/20 0714 03/17/20 1401 03/17/20 1402 03/18/20 0510  NA 143   < > 139   < > 140 140 145 137 142  K 4.4   < > 4.0   < > 4.0 4.4 4.3 4.0 4.2  CL 112*  --  109  --  107 109  --   --  113*  CO2 18*  --  21*  --  23 20*  --   --  20*  GLUCOSE 100*  --  91  --  104* 106*  --   --  96  BUN 37*  --  32*  --  38* 35*  --   --  22  CREATININE 1.49*  --  1.63*  --  1.84* 1.60*  --   --  1.33*  CALCIUM 9.3  --  8.7*  --  9.0 8.9  --   --  9.2   < > = values in this interval not displayed.   Liver Function Tests: Recent Labs  Lab 03/14/20 1123  AST 44*  ALT 35  ALKPHOS 66  BILITOT 1.0  PROT 6.8  ALBUMIN 3.8   No results for input(s): LIPASE, AMYLASE in the last 168 hours. No results for input(s): AMMONIA in the last 168 hours. CBC: Recent Labs  Lab 03/14/20 1123 03/14/20 1123 03/15/20 0154 03/15/20 0154 03/16/20 0433 03/17/20 0714 03/17/20 1401 03/17/20 1402 03/18/20 0510  WBC 21.1*  --  13.5*  --  11.6* 8.9  --   --  11.1*  NEUTROABS 16.9*  --   --   --   --   --   --   --   --   HGB 12.6   < > 11.5*   < > 12.1 11.3* 11.2* 10.9* 11.5*  HCT 40.5   < > 35.9*   < > 37.0 35.6* 33.0* 32.0* 36.9  MCV 96.2  --  94.2  --  94.9 94.9  --   --  98.9  PLT 292  --  248  --  250 251  --   --  227   < > = values in this interval not displayed.   Cardiac Enzymes: No results for input(s): CKTOTAL, CKMB, CKMBINDEX, TROPONINI in the last 168 hours. BNP: Invalid input(s): POCBNP CBG: No results for input(s): GLUCAP in the last 168 hours. D-Dimer No results for input(s): DDIMER in the last 72 hours. Hgb A1c No results for input(s): HGBA1C in the last 72 hours. Lipid Profile Recent Labs    03/16/20 0433  CHOL 148  HDL 39*  LDLCALC 85  TRIG 540  CHOLHDL 3.8   Thyroid function studies No results for input(s): TSH, T4TOTAL, T3FREE, THYROIDAB in the  last 72 hours.  Invalid input(s): FREET3 Anemia work up No results for input(s): VITAMINB12, FOLATE, FERRITIN, TIBC, IRON, RETICCTPCT in the last 72 hours. Urinalysis    Component Value Date/Time   COLORURINE YELLOW 05/23/2018 2226   APPEARANCEUR HAZY (A) 05/23/2018 2226   LABSPEC 1.014 05/23/2018 2226   PHURINE 5.0 05/23/2018 2226   GLUCOSEU NEGATIVE 05/23/2018 2226   HGBUR NEGATIVE 05/23/2018 2226   BILIRUBINUR NEGATIVE 05/23/2018 2226   KETONESUR NEGATIVE 05/23/2018 2226   PROTEINUR NEGATIVE 05/23/2018 2226   UROBILINOGEN 0.2 01/13/2015 1545   NITRITE NEGATIVE 05/23/2018 2226   LEUKOCYTESUR LARGE (A) 05/23/2018 2226   Sepsis Labs Invalid input(s): PROCALCITONIN,  WBC,  LACTICIDVEN Microbiology Recent Results (from the past 240 hour(s))  SARS Coronavirus 2 by RT PCR (hospital order, performed in [facility] hospital lab) Nasopharyngeal Nasopharyngeal Swab     Status: None   Collection Time: 03/14/20 11:36 AM   Specimen: Nasopharyngeal Swab  Result Value Ref Range Status   SARS Coronavirus 2 NEGATIVE NEGATIVE Final    Comment: (NOTE) SARS-CoV-2 target nucleic acids are NOT DETECTED.  The SARS-CoV-2 RNA is generally detectable in upper and lower respiratory specimens during  the acute phase of infection. The lowest concentration of SARS-CoV-2 viral copies this assay can detect is 250 copies / mL. A negative result does not preclude SARS-CoV-2 infection and should not be used as the sole basis for treatment or other patient management decisions.  A negative result may occur with improper specimen collection / handling, submission of specimen other than nasopharyngeal swab, presence of viral mutation(s) within the areas targeted by this assay, and inadequate number of viral copies (<250 copies / mL). A negative result must be combined with clinical observations, patient history, and epidemiological information.  Fact Sheet for Patients:    BoilerBrush.com.cy  Fact Sheet for Healthcare Providers: https://pope.com/  This test is not yet approved or  cleared by the [place] FDA and has been authorized for detection and/or diagnosis of SARS-CoV-2 by FDA under an Emergency Use Authorization (EUA).  This EUA will remain in effect (meaning this test can be used) for the duration of the COVID-19 declaration under Section 564(b)(1) of the Act, 21 U.S.C. section 360bbb-3(b)(1), unless the authorization is terminated or revoked sooner.  Performed at [facility], [address], [place], [place] [address]   Surgical PCR screen     Status: None   Collection Time: 03/17/20 12:00 PM   Specimen: Nasal Mucosa; Nasal Swab  Result Value Ref Range Status   MRSA, PCR NEGATIVE NEGATIVE Final   Staphylococcus aureus NEGATIVE NEGATIVE Final    Comment: (NOTE) The Xpert SA Assay (FDA approved for NASAL specimens in patients 66 years of age and older), is one component of a comprehensive surveillance program. It is not intended to diagnose infection nor to guide or monitor treatment. Performed at [facility], [address] [place], [place] [address]      Time coordinating discharge in minutes: 65  SIGNED:   Calvert Cantor, MD  [facility] 03/18/2020, 2:23 PM

## 2020-03-18 NOTE — Care Management Important Message (Signed)
Important Message  Patient Details  Name: Samantha Short MRN: [id number] Date of Birth: 13-Dec-1945   Medicare Important Message Given:  Yes     Renie Ora 03/18/2020, 9:33 AM

## 2020-03-25 DIAGNOSIS — M791 Myalgia, unspecified site: Secondary | ICD-10-CM | POA: Insufficient documentation

## 2020-04-01 ENCOUNTER — Ambulatory Visit (INDEPENDENT_AMBULATORY_CARE_PROVIDER_SITE_OTHER): Payer: Medicare HMO | Admitting: Emergency Medicine

## 2020-04-01 ENCOUNTER — Other Ambulatory Visit: Payer: Self-pay

## 2020-04-01 DIAGNOSIS — I441 Atrioventricular block, second degree: Secondary | ICD-10-CM

## 2020-04-01 LAB — CUP PACEART INCLINIC DEVICE CHECK
Battery Remaining Longevity: 140 mo
Battery Voltage: 3.22 V
Brady Statistic AP VP Percent: 0.08 %
Brady Statistic AP VS Percent: 0 %
Brady Statistic AS VP Percent: 99.25 %
Brady Statistic AS VS Percent: 0.67 %
Brady Statistic RA Percent Paced: 0.09 %
Brady Statistic RV Percent Paced: 99.33 %
Date Time Interrogation Session: 20210722113714
Implantable Lead Implant Date: 20210707
Implantable Lead Implant Date: 20210707
Implantable Lead Location: 753859
Implantable Lead Location: 753860
Implantable Lead Model: 5076
Implantable Lead Model: 5076
Implantable Lead Serial Number: 8264443
Implantable Pulse Generator Implant Date: 20210707
Lead Channel Impedance Value: 361 Ohm
Lead Channel Impedance Value: 494 Ohm
Lead Channel Impedance Value: 570 Ohm
Lead Channel Impedance Value: 570 Ohm
Lead Channel Pacing Threshold Amplitude: 0.625 V
Lead Channel Pacing Threshold Amplitude: 0.875 V
Lead Channel Pacing Threshold Pulse Width: 0.4 ms
Lead Channel Pacing Threshold Pulse Width: 0.4 ms
Lead Channel Sensing Intrinsic Amplitude: 10.875 mV
Lead Channel Sensing Intrinsic Amplitude: 4.75 mV
Lead Channel Setting Pacing Amplitude: 3.5 V
Lead Channel Setting Pacing Amplitude: 3.5 V
Lead Channel Setting Pacing Pulse Width: 0.4 ms
Lead Channel Setting Sensing Sensitivity: 2.8 mV

## 2020-04-01 NOTE — Progress Notes (Signed)
Wound check appointment. Steri-strips removed. Wound without redness or edema. Incision edges approximated, wound well healed. Normal device function. Thresholds, sensing, and impedances consistent with implant measurements. Device programmed at 3.5V/auto capture programmed on for extra safety margin until 3 month visit. Presenting rhythm AS/VP 100, extended AV delays PAV/SAV 220 ms. Presenting after AS/VS 94. Histogram distribution appropriate for patient and level of activity. AT/AF burden < 0.1%. No high ventricular rates noted. Patient educated about wound care, arm mobility, lifting restrictions. ROV in 3 months with Dr. Graciela Husbands. Next home remote 06/17/20.  Patient reports of fatigue and feeling like she needs to take in deep breaths. Patient denies chest pain or shortness of breath. PR interval measured 200 ms.  Consulted with Leta Jungling, [employer], recommended to extend PAV/SAV delays to 220 ms. Presenting rhythm now AS/VS 94. Patient reported of feeling improvement. Consulted with Dr. Graciela Husbands and agrees with plan.  Patient appears very anxious and sometimes tearful. Reassured to patient that her device would be monitored and she can always call us with any questions or concerns. Patient appreciative and agrees to call if she needs anything.

## 2020-04-05 ENCOUNTER — Other Ambulatory Visit: Payer: Self-pay

## 2020-04-05 MED ORDER — LISINOPRIL 10 MG PO TABS: 10.0000 mg | ORAL_TABLET | Freq: Every day | ORAL | 8 refills | Status: DC

## 2020-04-05 NOTE — Telephone Encounter (Signed)
Pt's medication was sent to pt's pharmacy as requested. Confirmation received.  °

## 2020-04-07 ENCOUNTER — Encounter: Payer: Medicare HMO | Admitting: *Deleted

## 2020-04-07 ENCOUNTER — Other Ambulatory Visit: Payer: Self-pay

## 2020-04-07 VITALS — BP 143/72 | HR 87 | Wt 216.4 lb

## 2020-04-07 DIAGNOSIS — Z006 Encounter for examination for normal comparison and control in clinical research program: Secondary | ICD-10-CM

## 2020-04-07 NOTE — Research (Signed)
Subject was re-consented to US Version 8.1 14Jan2021 Local 06Apr2021  ° °Subject Name: Samantha Short ° °Subject met inclusion and exclusion criteria.  The informed consent form, study requirements and expectations were reviewed with the subject and questions and concerns were addressed prior to the signing of the consent form.  The subject verbalized understanding of the trial requirements.  The subject agreed to participate in the CLEAR trial and signed the informed consent at 0910 on 04/07/20  The informed consent was obtained prior to performance of any protocol-specific procedures for the subject.  A copy of the signed informed consent was given to the subject and a copy was placed in the subject's medical record.  ° °[name], [name] ° ° ° ° °Subject to clinic for visit T12-M30 in CLEAR research study. All AE's/SAE's updated and reported to sponsor. All medication changes updated in EDC. New IP dispensed and next appointment scheduled.  °

## 2020-04-12 MED ORDER — FUROSEMIDE 20 MG PO TABS: 20.0000 mg | ORAL_TABLET | Freq: Every day | ORAL | 6 refills | Status: DC

## 2020-04-12 NOTE — Telephone Encounter (Signed)
Pt is requesting a refill on Furosemide. Would Dr. Clifton James like to refill this medication? Please address

## 2020-04-15 DIAGNOSIS — E559 Vitamin D deficiency, unspecified: Secondary | ICD-10-CM | POA: Diagnosis not present

## 2020-04-15 DIAGNOSIS — E0590 Thyrotoxicosis, unspecified without thyrotoxic crisis or storm: Secondary | ICD-10-CM | POA: Diagnosis not present

## 2020-04-15 DIAGNOSIS — R5383 Other fatigue: Secondary | ICD-10-CM | POA: Diagnosis not present

## 2020-04-15 DIAGNOSIS — I10 Essential (primary) hypertension: Secondary | ICD-10-CM | POA: Diagnosis not present

## 2020-05-18 ENCOUNTER — Encounter: Payer: Self-pay | Admitting: Endocrinology

## 2020-05-20 DIAGNOSIS — N2589 Other disorders resulting from impaired renal tubular function: Secondary | ICD-10-CM | POA: Diagnosis not present

## 2020-05-20 DIAGNOSIS — K219 Gastro-esophageal reflux disease without esophagitis: Secondary | ICD-10-CM | POA: Diagnosis not present

## 2020-05-20 DIAGNOSIS — N2581 Secondary hyperparathyroidism of renal origin: Secondary | ICD-10-CM | POA: Diagnosis not present

## 2020-05-20 DIAGNOSIS — I129 Hypertensive chronic kidney disease with stage 1 through stage 4 chronic kidney disease, or unspecified chronic kidney disease: Secondary | ICD-10-CM | POA: Diagnosis not present

## 2020-05-20 DIAGNOSIS — N183 Chronic kidney disease, stage 3 unspecified: Secondary | ICD-10-CM | POA: Diagnosis not present

## 2020-05-20 DIAGNOSIS — N189 Chronic kidney disease, unspecified: Secondary | ICD-10-CM | POA: Diagnosis not present

## 2020-06-17 ENCOUNTER — Ambulatory Visit (INDEPENDENT_AMBULATORY_CARE_PROVIDER_SITE_OTHER): Payer: Medicare HMO

## 2020-06-17 DIAGNOSIS — I441 Atrioventricular block, second degree: Secondary | ICD-10-CM | POA: Diagnosis not present

## 2020-06-17 LAB — CUP PACEART REMOTE DEVICE CHECK
Battery Remaining Longevity: 129 mo
Battery Voltage: 3.19 V
Brady Statistic AP VP Percent: 0.75 %
Brady Statistic AP VS Percent: 0.28 %
Brady Statistic AS VP Percent: 61.4 %
Brady Statistic AS VS Percent: 37.57 %
Brady Statistic RA Percent Paced: 1.03 %
Brady Statistic RV Percent Paced: 62.14 %
Date Time Interrogation Session: 20211006235206
Implantable Lead Implant Date: 20210707
Implantable Lead Implant Date: 20210707
Implantable Lead Location: 753859
Implantable Lead Location: 753860
Implantable Lead Model: 5076
Implantable Lead Model: 5076
Implantable Lead Serial Number: 8264443
Implantable Pulse Generator Implant Date: 20210707
Lead Channel Impedance Value: 342 Ohm
Lead Channel Impedance Value: 437 Ohm
Lead Channel Impedance Value: 475 Ohm
Lead Channel Impedance Value: 513 Ohm
Lead Channel Pacing Threshold Amplitude: 0.625 V
Lead Channel Pacing Threshold Amplitude: 0.75 V
Lead Channel Pacing Threshold Pulse Width: 0.4 ms
Lead Channel Pacing Threshold Pulse Width: 0.4 ms
Lead Channel Sensing Intrinsic Amplitude: 4.875 mV
Lead Channel Sensing Intrinsic Amplitude: 4.875 mV
Lead Channel Sensing Intrinsic Amplitude: 9.5 mV
Lead Channel Sensing Intrinsic Amplitude: 9.5 mV
Lead Channel Setting Pacing Amplitude: 3.25 V
Lead Channel Setting Pacing Amplitude: 3.25 V
Lead Channel Setting Pacing Pulse Width: 0.4 ms
Lead Channel Setting Sensing Sensitivity: 2.8 mV

## 2020-06-21 DIAGNOSIS — Z950 Presence of cardiac pacemaker: Secondary | ICD-10-CM | POA: Insufficient documentation

## 2020-06-21 NOTE — Progress Notes (Signed)
Remote pacemaker transmission.   

## 2020-06-22 ENCOUNTER — Other Ambulatory Visit: Payer: Self-pay

## 2020-06-22 ENCOUNTER — Ambulatory Visit: Payer: Medicare HMO | Admitting: Internal Medicine

## 2020-06-22 ENCOUNTER — Encounter: Payer: Self-pay | Admitting: Internal Medicine

## 2020-06-22 VITALS — BP 124/84 | HR 90 | Ht 62.0 in | Wt 214.8 lb

## 2020-06-22 DIAGNOSIS — I441 Atrioventricular block, second degree: Secondary | ICD-10-CM | POA: Diagnosis not present

## 2020-06-22 DIAGNOSIS — Z950 Presence of cardiac pacemaker: Secondary | ICD-10-CM

## 2020-06-22 NOTE — Progress Notes (Signed)
Patient Care Team: Mila Palmer, MD as PCP - General (Family Medicine) Kathleene Hazel, MD as PCP - Cardiology (Cardiology)   HPI  Samantha Short is a 74 y.o. female seen in followup for pacemaker Medtronic implanted 7/21 for symptomatic second-degree heart block and intermittent complete heart block.  Procedure was complicated by microperforation requiring lead revision.  Coronary artery disease with catheterization 2010 demonstrating LAD proximal stenosis 80% for which she underwent LIMA-to the LAD  DATE TEST EF   7/21 Echo and  65% %   7/21 cMRI  79 catheter % No amyloid  7/21 LHC  LIMA-LAD patent but small LADp20%   She continues to complain of shortness of breath.  This states she says back to about 2 months before her pacemaker was implanted.  At that point she had no shortness of breath including exercising and swimming.  Her breathlessness is somewhat improved following pacemaker implantation but is still limited.  Denies orthopnea nocturnal dyspnea or peripheral edema.  Reviewing imaging studies including CT scan demonstrated chronic granulomatous disease.   Records and Results Reviewed   Past Medical History:  Diagnosis Date  . Asthma   . Chronic diastolic CHF (congestive heart failure) (HCC)    a. 02/2011 Echo: EF 55-60%, Gr2 DD, Mild MR  . Coronary artery disease    a. s/p CABG x 1 2010:  LIMA->LAD.;  b. amdx for CP => LHC 07/04/12: LAD 70-80%, mid RCA 30%, LIMA-LAD patent with competitive flow limiting distal LAD filling, EF 70% with hyperdynamic LV function. Medical therapy continued.  . Dyslipidemia   . GERD (gastroesophageal reflux disease)   . Hyperlipidemia   . Hypertension   . Hyperthyroidism   . Mitral regurgitation    a. mild by echo 02/2011.    Past Surgical History:  Procedure Laterality Date  . ABDOMINAL HYSTERECTOMY  1980  . CARDIAC CATHETERIZATION  07/27/09 & 07/28/09  . CESAREAN SECTION    . CORONARY ARTERY BYPASS GRAFT   08/03/2009   x1 using left internal mammary artery to distal left anterior  descending coronary artery.   Marland Kitchen GALLBLADDER SURGERY  2001  . LEFT HEART CATHETERIZATION WITH CORONARY/GRAFT ANGIOGRAM N/A 07/05/2012   Procedure: LEFT HEART CATHETERIZATION WITH Isabel Caprice;  Surgeon: Wendall Stade, MD;  Location: [facility] CATH LAB;  Service: Cardiovascular;  Laterality: N/A;  . PACEMAKER IMPLANT N/A 03/17/2020   Procedure: PACEMAKER IMPLANT;  Surgeon: Duke Salvia, MD;  Location: [facility] INVASIVE CV LAB;  Service: Cardiovascular;  Laterality: N/A;  . RIGHT/LEFT HEART CATH AND CORONARY/GRAFT ANGIOGRAPHY N/A 03/17/2020   Procedure: RIGHT/LEFT HEART CATH AND CORONARY/GRAFT ANGIOGRAPHY;  Surgeon: Iran Ouch, MD;  Location: [facility] INVASIVE CV LAB;  Service: Cardiovascular;  Laterality: N/A;  . SHOULDER SURGERY  1998 / 2001   from accident  . VESICOVAGINAL FISTULA CLOSURE W/ TAH  1998    Current Meds  Medication Sig  . acetaminophen (TYLENOL) 325 MG tablet Take 325 mg by mouth every 6 (six) hours as needed for headache (pain).   Marland Kitchen albuterol (VENTOLIN HFA) 108 (90 Base) MCG/ACT inhaler Inhale 2 puffs into the lungs every 4 (four) hours as needed for wheezing or shortness of breath (asthma).   . AMBULATORY NON FORMULARY MEDICATION Take 180 mg by mouth daily at 12 noon. Medication Name: Clear Research Study drug.Bempodic acid versus placebo  . aspirin EC 81 MG tablet Take 81 mg by mouth at bedtime. Swallow whole.  . Cholecalciferol (VITAMIN D) 50 MCG (2000 UT)  CAPS Take 2,000 Units by mouth daily.   . furosemide (LASIX) 20 MG tablet Take 1 tablet (20 mg total) by mouth daily.  . Investigational - Study Medication Take 1 tablet by mouth daily. Unnamed study/Medication Name/alternative to statins: Bempedoic Acid 180 mg vs placebo Managed by Dr. Jens Som. Start date March 2019/ original term 3 years - extended indefinitely  . levothyroxine (SYNTHROID) 50 MCG tablet Take 50 mcg by mouth every morning.  Marland Kitchen  lisinopril (ZESTRIL) 10 MG tablet Take 1 tablet (10 mg total) by mouth daily.  . Multiple Vitamin (MULTIVITAMIN WITH MINERALS) TABS tablet Take 1 tablet by mouth daily.  . Multiple Vitamins-Minerals (PRESERVISION AREDS 2) CAPS Take 1 capsule by mouth 2 (two) times daily.  Marland Kitchen omeprazole (PRILOSEC OTC) 20 MG tablet Take 20 mg by mouth daily.  Marland Kitchen OVER THE COUNTER MEDICATION Take 1 tablet by mouth daily as needed (allergies). OTC allergy medication  . vitamin B-12 (CYANOCOBALAMIN) 1000 MCG tablet Take 1,000 mcg by mouth daily.    Allergies  Allergen Reactions  . Ceclor [Cefaclor] Other (See Comments)    Lost vision in eye  . Hctz [Hydrochlorothiazide] Other (See Comments)    Renal insufficiency  . Lipitor [Atorvastatin Calcium] Other (See Comments)    Increased A1C  . Norvasc [Amlodipine] Other (See Comments)    Makes patient stiff  . Penicillins Hives, Shortness Of Breath, Itching and Rash    Has patient had a PCN reaction causing immediate rash, facial/tongue/throat swelling, SOB or lightheadedness with hypotension: Yes Has patient had a PCN reaction causing severe rash involving mucus membranes or skin necrosis: Unk Has patient had a PCN reaction that required hospitalization: No Has patient had a PCN reaction occurring within the last 10 years: No If all of the above answers are "NO", then may proceed with Cephalosporin use.   . Sulfa Antibiotics Other (See Comments)    CAUSES SHOCK  . Statins Other (See Comments)    MYALGIAS AND WEAKNESS  . Erythromycin Rash  . Latex Rash  . Levofloxacin Rash  . Tetracyclines & Related Rash      Review of Systems negative except from HPI and PMH  Physical Exam BP 124/84   Pulse 90   Ht 5\' 2"  (1.575 m)   Wt 214 lb 12.8 oz (97.4 kg)   SpO2 95%   BMI 39.29 kg/m  Well developed and well nourished in no acute distress HENT normal E scleral and icterus clear Neck Supple JVP flat; carotids brisk and full Device pocket well healed; without  hematoma or erythema.  There is no tethering  Clear to ausculation  Regular rate and rhythm, no murmurs gallops or rub Soft with active bowel sounds No clubbing cyanosis  Edema Alert and oriented, grossly normal motor and sensory function Skin Warm and Dry  ECG P synchronous pacing at 90 Intervals 30/14/42  CrCl cannot be calculated (Patient's most recent lab result is older than the maximum 21 days allowed.).   Assessment and  Plan Heart block-second-degree  Dyspnea on exertion-progressive  Coronary artery disease-single-vessel CABG  Granulomatous lung disease  Obesity  Aortic stenosis-mild  Renal insufficiency Estimated Creatinine Clearance: 33.7 mL/min (A) (by C-G formula based on SCr of 1.63 mg/dL (H)). Grade 3  Dyspnea is not improved  No volume overload,  If not would refer for cardiopulmonary stress test; known granulomatous lung disease ( by CT scanning)   Heart block is progressive.  There is significant intra-atrial conduction delay so that an AV delay of 220  is associate with a PR interval of 300.  Will reprogram AV delay to improve hemodynamics    Current medicines are reviewed at length with the patient today .  The patient does not  have concerns regarding medicines.

## 2020-06-22 NOTE — Patient Instructions (Signed)
Medication Instructions:  Your physician recommends that you continue on your current medications as directed. Please refer to the Current Medication list given to you today.  *If you need a refill on your cardiac medications before your next appointment, please call your pharmacy*   Lab Work: None ordered.  If you have labs (blood work) drawn today and your tests are completely normal, you will receive your results only by:  MyChart Message (if you have MyChart) OR  A paper copy in the mail If you have any lab test that is abnormal or we need to change your treatment, we will call you to review the results.   Testing/Procedures: None ordered.    Follow-Up: At [facility], you and your health needs are our priority.  As part of our continuing mission to provide you with exceptional heart care, we have created designated Provider Care Teams.  These Care Teams include your primary Cardiologist (physician) and Advanced Practice Providers (APPs -  Physician Assistants and Nurse Practitioners) who all work together to provide you with the care you need, when you need it.  We recommend signing up for the patient portal called "MyChart".  Sign up information is provided on this After Visit Summary.  MyChart is used to connect with patients for Virtual Visits (Telemedicine).  Patients are able to view lab/test results, encounter notes, upcoming appointments, etc.  Non-urgent messages can be sent to your provider as well.   To learn more about what you can do with MyChart, go to [email or web address].    Your next appointment:   9 month(s)  The format for your next appointment:   In Person  Provider:   Sherryl Manges, MD   Other Instructions Please let us know if your shortness of breath doea not improve over the next couple of weeks.

## 2020-06-28 ENCOUNTER — Other Ambulatory Visit: Payer: Self-pay | Admitting: *Deleted

## 2020-06-28 MED ORDER — NITROGLYCERIN 0.4 MG SL SUBL
0.4000 mg | SUBLINGUAL_TABLET | SUBLINGUAL | 3 refills | Status: DC | PRN
Start: 2020-06-28 — End: 2022-03-07

## 2020-06-28 NOTE — Progress Notes (Signed)
ERROR

## 2020-07-30 ENCOUNTER — Telehealth: Payer: Self-pay | Admitting: *Deleted

## 2020-07-30 NOTE — Telephone Encounter (Signed)
Completed phone call for T13, M33 for CLEAR research study. All concomitant medications have been reviewed and updated if applicable. No AE's or SAE's to report to sponsor at this time. Next in-clinic appointment scheduled for Tuesday, January 25th, 2022 @ 0900.

## 2020-08-26 ENCOUNTER — Ambulatory Visit: Payer: Medicare HMO | Admitting: Cardiovascular Disease

## 2020-08-26 DIAGNOSIS — E039 Hypothyroidism, unspecified: Secondary | ICD-10-CM | POA: Diagnosis not present

## 2020-08-26 DIAGNOSIS — R5381 Other malaise: Secondary | ICD-10-CM | POA: Diagnosis not present

## 2020-08-26 DIAGNOSIS — E538 Deficiency of other specified B group vitamins: Secondary | ICD-10-CM | POA: Diagnosis not present

## 2020-08-30 ENCOUNTER — Telehealth: Payer: Self-pay | Admitting: Endocrinology

## 2020-08-30 NOTE — Telephone Encounter (Signed)
We need to get reports from her PCP.  From her last note she was blaming her leg cramps on the levothyroxine but there is no connection between the 2.  Not clear what difficulty she has with levothyroxine.  If she can wait till Dr. Everardo All is back in the office she can make an appointment to discuss

## 2020-08-30 NOTE — Telephone Encounter (Signed)
Please see below and advise.

## 2020-08-30 NOTE — Telephone Encounter (Signed)
Patient called to advise that she had her Thyroid levels checked at her PCP office and that her T3 levels were elevated to a 5 (per patient).  Patient states is unable to take Levothyroxine, her body is hypersensitive to the medication in a negative way.  Patient is requesting a different medication.  She has stopped taking the medication at this time.  [phone]

## 2020-08-31 DIAGNOSIS — H16223 Keratoconjunctivitis sicca, not specified as Sjogren's, bilateral: Secondary | ICD-10-CM | POA: Diagnosis not present

## 2020-08-31 DIAGNOSIS — H353132 Nonexudative age-related macular degeneration, bilateral, intermediate dry stage: Secondary | ICD-10-CM | POA: Diagnosis not present

## 2020-09-07 DIAGNOSIS — Z23 Encounter for immunization: Secondary | ICD-10-CM | POA: Diagnosis not present

## 2020-09-07 DIAGNOSIS — D510 Vitamin B12 deficiency anemia due to intrinsic factor deficiency: Secondary | ICD-10-CM | POA: Diagnosis not present

## 2020-09-07 DIAGNOSIS — E538 Deficiency of other specified B group vitamins: Secondary | ICD-10-CM | POA: Diagnosis not present

## 2020-09-13 ENCOUNTER — Telehealth: Payer: Self-pay | Admitting: Endocrinology

## 2020-09-13 NOTE — Telephone Encounter (Signed)
Please see below.

## 2020-09-13 NOTE — Telephone Encounter (Signed)
-----   Message from Noelle Penner sent at 09/13/2020  9:46 AM EST ----- Regarding: FW: Transfer of Care FYI  ----- Message ----- From: Carlus Pavlov, MD Sent: 09/08/2020  11:10 AM EST To: Noelle Penner Subject: RE: Transfer of Care                           I reviewed the chart but I do not think I could help further.  Please decline for me. Ty! C ----- Message ----- From: Noelle Penner Sent: 09/08/2020  10:53 AM EST To: Romero Belling, MD, Carlus Pavlov, MD, # Subject: Transfer of Care                               Mrs Karapetyan has called and requested that she be switched to Dr Elvera Lennox for her continued care.  Patient advises that this is her request as well as the recommendation of her PCP.  Please review and advise - respond back to ALL please and Thank You!

## 2020-09-13 NOTE — Telephone Encounter (Signed)
Samantha Short has called and has now requested that she be switched to Dr Lonzo Cloud for her continued care.  Please review and advise - respond back to ALL please and Thank You!

## 2020-09-15 NOTE — Telephone Encounter (Signed)
Patient states she has a Orthoptist with [facility] and will no longer be getting treatment at [facility]

## 2020-09-16 ENCOUNTER — Ambulatory Visit (INDEPENDENT_AMBULATORY_CARE_PROVIDER_SITE_OTHER): Payer: Medicare HMO

## 2020-09-16 ENCOUNTER — Telehealth: Payer: Self-pay

## 2020-09-16 DIAGNOSIS — I441 Atrioventricular block, second degree: Secondary | ICD-10-CM | POA: Diagnosis not present

## 2020-09-16 LAB — CUP PACEART REMOTE DEVICE CHECK
Battery Remaining Longevity: 140 mo
Battery Voltage: 3.16 V
Brady Statistic AP VP Percent: 1.09 %
Brady Statistic AP VS Percent: 0.6 %
Brady Statistic AS VP Percent: 76.44 %
Brady Statistic AS VS Percent: 21.86 %
Brady Statistic RA Percent Paced: 1.64 %
Brady Statistic RV Percent Paced: 77.54 %
Date Time Interrogation Session: 20220105224415
Implantable Lead Implant Date: 20210707
Implantable Lead Implant Date: 20210707
Implantable Lead Location: 753859
Implantable Lead Location: 753860
Implantable Lead Model: 5076
Implantable Lead Model: 5076
Implantable Lead Serial Number: 8264443
Implantable Pulse Generator Implant Date: 20210707
Lead Channel Impedance Value: 361 Ohm
Lead Channel Impedance Value: 418 Ohm
Lead Channel Impedance Value: 418 Ohm
Lead Channel Impedance Value: 494 Ohm
Lead Channel Pacing Threshold Amplitude: 0.5 V
Lead Channel Pacing Threshold Amplitude: 0.625 V
Lead Channel Pacing Threshold Pulse Width: 0.4 ms
Lead Channel Pacing Threshold Pulse Width: 0.4 ms
Lead Channel Sensing Intrinsic Amplitude: 3.125 mV
Lead Channel Sensing Intrinsic Amplitude: 3.125 mV
Lead Channel Sensing Intrinsic Amplitude: 8.75 mV
Lead Channel Sensing Intrinsic Amplitude: 8.75 mV
Lead Channel Setting Pacing Amplitude: 1.5 V
Lead Channel Setting Pacing Amplitude: 2.5 V
Lead Channel Setting Pacing Pulse Width: 0.4 ms
Lead Channel Setting Sensing Sensitivity: 2.8 mV

## 2020-09-16 NOTE — Telephone Encounter (Signed)
Spoke with patient about her recent transmission.  There were two atrial arrhythmias detected, the longest was 3 minutes and 10 seconds.There was one NSVT arrhythmia detected that lasted 11 beats on 09/15/2020. Patient was concerned because she saw the notes in her chart. Patient advised she was not having any chest pain or palpitations at this time. Advised patient that the Device Clinic would be monitoring her transmissions and that we would contact her if there were any concerns or episodes that lasted longer than 20 beats. ED precautions reviewed and advised if patient stated having any chest pain, syncope or shortness of breath to seek emergency care. Advised patient that if she had any device concerns or problems to contact the Device Clinic at [phone]. Patient's questions about device interference answered.

## 2020-09-16 NOTE — Telephone Encounter (Signed)
The patient called to get results from the last transmission. She got a Soil scientist message and do not understand what the notes saying. I let her speak with device nurse Shanda Bumps, rn.

## 2020-09-30 NOTE — Progress Notes (Signed)
Remote pacemaker transmission.   

## 2020-10-05 ENCOUNTER — Other Ambulatory Visit: Payer: Self-pay

## 2020-10-05 VITALS — BP 101/53 | HR 86

## 2020-10-05 DIAGNOSIS — Z006 Encounter for examination for normal comparison and control in clinical research program: Secondary | ICD-10-CM

## 2020-10-05 DIAGNOSIS — E538 Deficiency of other specified B group vitamins: Secondary | ICD-10-CM | POA: Diagnosis not present

## 2020-10-05 DIAGNOSIS — D510 Vitamin B12 deficiency anemia due to intrinsic factor deficiency: Secondary | ICD-10-CM | POA: Diagnosis not present

## 2020-10-05 NOTE — Research (Signed)
Subject came in to clinic for CLEAR T14 M36 visit. No SAE to report. New diagnosis of pernicious anemia; will report AE. Concomitant medications reviewed and updated. New IP dispensed. Next visit scheduled for 04/05/2021.

## 2020-10-28 ENCOUNTER — Telehealth: Payer: Self-pay | Admitting: Internal Medicine

## 2020-10-28 NOTE — Telephone Encounter (Signed)
Called patient.  She reports very very SOB - off and on since Monday.  Comes w rest worse w activity.  Able to lay down and sleep in bed.  No energy Dx w pernicious anemia and receives monthly B12 shots Due for next shot Tues 2/22 Gets SOB prior to needing the B12 injection. She feels fine for first few weeks but before the next injection she feels no energy and SOB  Does not weigh regularly. No swelling Denies any recent respiratory issues, no nausea vomiting or diarrhea No chest pain, no heart pounding or racing. No fever  Feels dehydrated - waking up w dry mouth and eyes. Drink about 3-4  16 ounce bottles daily, having constipation issues. Has been having tyroid issues and has an appt with a new endocrinologist.  Takes  levothyroxine.   I transferred her to device clinic to have her send a manual transmission to make sure all is functioning well and no adjustments are needed.

## 2020-10-28 NOTE — Telephone Encounter (Signed)
I helped the patient send a manual transmission with her monitor. Transmission received. I let her know that the nurse is going to review it and give her a call back in 5 minutes.

## 2020-10-28 NOTE — Telephone Encounter (Signed)
New Message:    Pt said she talked To Dr Ali Lowe office today and Coralee North her to call her Dardiologist and get an appt asap   Pt c/o Shortness Of Breath: STAT if SOB developed within the last 24 hours or pt is noticeably SOB on the phone  1. Are you currently SOB (can you hear that pt is SOB on the phone)?  a little  2. How long have you been experiencing SOB? Since Mjonday  3. Are you SOB when sitting or when up moving around?  both  4. Are you currently experiencing any other symptoms? Weakness, and no energy

## 2020-10-28 NOTE — Telephone Encounter (Signed)
Manual transmission received- Normal device function.  Presenting HR AS/VS 110 (ST).  Noted on cardiac compass that pt HR has been increased lately, with a decrease in pacing %.  1 NSVT episode in January, approximately 10 beats.    Spoke with pt, advised that the PM function does not appear to be an issue.  HR is eleated, she will speak with PCP to determine if anemia is causing the issue.

## 2020-10-29 DIAGNOSIS — H3530 Unspecified macular degeneration: Secondary | ICD-10-CM | POA: Diagnosis not present

## 2020-10-29 DIAGNOSIS — H524 Presbyopia: Secondary | ICD-10-CM | POA: Diagnosis not present

## 2020-10-29 DIAGNOSIS — H52203 Unspecified astigmatism, bilateral: Secondary | ICD-10-CM | POA: Diagnosis not present

## 2020-10-29 DIAGNOSIS — H5213 Myopia, bilateral: Secondary | ICD-10-CM | POA: Diagnosis not present

## 2020-10-29 DIAGNOSIS — Z961 Presence of intraocular lens: Secondary | ICD-10-CM | POA: Diagnosis not present

## 2020-10-29 NOTE — Telephone Encounter (Signed)
Samantha Short  thanks

## 2020-11-02 DIAGNOSIS — E538 Deficiency of other specified B group vitamins: Secondary | ICD-10-CM | POA: Diagnosis not present

## 2020-11-12 DIAGNOSIS — E890 Postprocedural hypothyroidism: Secondary | ICD-10-CM | POA: Diagnosis not present

## 2020-11-30 DIAGNOSIS — E538 Deficiency of other specified B group vitamins: Secondary | ICD-10-CM | POA: Diagnosis not present

## 2020-12-16 ENCOUNTER — Ambulatory Visit (INDEPENDENT_AMBULATORY_CARE_PROVIDER_SITE_OTHER): Payer: Medicare HMO

## 2020-12-16 DIAGNOSIS — I441 Atrioventricular block, second degree: Secondary | ICD-10-CM

## 2020-12-16 LAB — CUP PACEART REMOTE DEVICE CHECK
Battery Remaining Longevity: 144 mo
Battery Voltage: 3.13 V
Brady Statistic AP VP Percent: 0.02 %
Brady Statistic AP VS Percent: 0.86 %
Brady Statistic AS VP Percent: 0.97 %
Brady Statistic AS VS Percent: 98.16 %
Brady Statistic RA Percent Paced: 0.85 %
Brady Statistic RV Percent Paced: 0.99 %
Date Time Interrogation Session: 20220406220620
Implantable Lead Implant Date: 20210707
Implantable Lead Implant Date: 20210707
Implantable Lead Location: 753859
Implantable Lead Location: 753860
Implantable Lead Model: 5076
Implantable Lead Model: 5076
Implantable Lead Serial Number: 8264443
Implantable Pulse Generator Implant Date: 20210707
Lead Channel Impedance Value: 323 Ohm
Lead Channel Impedance Value: 418 Ohm
Lead Channel Impedance Value: 456 Ohm
Lead Channel Impedance Value: 551 Ohm
Lead Channel Pacing Threshold Amplitude: 0.625 V
Lead Channel Pacing Threshold Amplitude: 0.75 V
Lead Channel Pacing Threshold Pulse Width: 0.4 ms
Lead Channel Pacing Threshold Pulse Width: 0.4 ms
Lead Channel Sensing Intrinsic Amplitude: 3.375 mV
Lead Channel Sensing Intrinsic Amplitude: 3.375 mV
Lead Channel Sensing Intrinsic Amplitude: 6.625 mV
Lead Channel Sensing Intrinsic Amplitude: 6.625 mV
Lead Channel Setting Pacing Amplitude: 1.75 V
Lead Channel Setting Pacing Amplitude: 2.5 V
Lead Channel Setting Pacing Pulse Width: 0.4 ms
Lead Channel Setting Sensing Sensitivity: 2.8 mV

## 2020-12-20 ENCOUNTER — Other Ambulatory Visit: Payer: Self-pay | Admitting: Cardiovascular Disease

## 2020-12-22 ENCOUNTER — Telehealth: Payer: Self-pay

## 2020-12-22 NOTE — Telephone Encounter (Signed)
The patient wanted to know if she can go through a metal detector? If she going to be gone for 10 days do she needs to take her monitor with her? I told her she can walk through a metal detector but do not let them put the wand over her pacemaker. Make sure she take her Medtronic ID card with her. I told her our rule of thumb is if the patient is going to be gone for more than 7 days, to take the monitor with them. The patient verbalized understanding and thanked me for helping her.

## 2020-12-29 NOTE — Progress Notes (Signed)
Remote pacemaker transmission.   

## 2020-12-31 DIAGNOSIS — E538 Deficiency of other specified B group vitamins: Secondary | ICD-10-CM | POA: Diagnosis not present

## 2021-01-10 ENCOUNTER — Telehealth: Payer: Self-pay | Admitting: *Deleted

## 2021-01-18 ENCOUNTER — Telehealth: Payer: Self-pay | Admitting: Internal Medicine

## 2021-01-18 NOTE — Telephone Encounter (Signed)
Attempted phone call to pt.  Unable to leave voicemail message as mailbox has not been set up.  Will attempt to contact again another time.

## 2021-01-18 NOTE — Telephone Encounter (Signed)
Pt contacted our office via patient schedule requesting an appt with Dr. Graciela Husbands between 05/12-07/12 for SOB. Pt has been scheduled for the first available with Dr. Graciela Husbands on 03/31/21 and is being advised that a nurse will be contacting them to discuss her symptoms and possibly schedule for sooner if felt need be. Please advise.

## 2021-01-19 NOTE — Telephone Encounter (Signed)
Spoke with pt re leg this has been a long time problem Per pt decided to call due to grandson noticing the swelling to left leg  Per pt significant difference from right leg Pt notes is not as swollen upon arising but as day progresses this worsens. Pt states leg is really cold and has to wear socks a lot of the time.Pt currently taking Furosemide 40 mg qod alt with 20 mg qod per kidney Dr  Pt has stage 3 kidney disease Pt also had called yesterday with SOB pt was told by PCP this maybe form recent dx of pernicious anemia Pt currently taking B 12 monthly and is due for next injection 5/22 .Will forward to Dr Clifton James for review and recommendations./cy

## 2021-01-19 NOTE — Telephone Encounter (Signed)
If her weight is stable and the lower extremity edema resolves by morning, nothing to change. Continue Lasix. If there is a noticeable difference in leg swelling with one leg being more swollen than the other and this is new, would need to exclude DVT with LE venous u/s. Thayer Ohm

## 2021-01-19 NOTE — Telephone Encounter (Signed)
Thank you.  I am a little concerned that my left leg is extremely cold to the touch and appears to be almost twice the size of the right leg. Has been an issue since the pacemaker was inserted. Support socks help but would like to know if there is any other solutions.  Socks and shorts look odd

## 2021-01-19 NOTE — Telephone Encounter (Signed)
Does not weight daily.  Has not noticed any differences.  Reports last weight was 204.   In the mornings her leg is normal size, starts swelling after she gets up.  The left leg is cool to touch all the time, this is not new but might be worse. She also has left leg pain which is not new.  She has learned to wear support socks to minimize the pain.  Both calves hurt with walking/stretching if she doesn't wear them.  She agrees this does not something urgent.  She does need a follow up appointment.  She has a B12 shot on 5/22. This helps her SOB.  She has a follow up with EP in July and is due for f/u with CM.  She is aware that there is no availability and that I am placing her on the wait list.

## 2021-01-20 NOTE — Telephone Encounter (Signed)
Completed phone call/medical record review for T15, M39 for the CLEAR research study. Subject's concomitant medications have been reviewed and updated in [facility] if applicable. There are no new AE's or SAE's to report to sponsor at this time. Next appointment scheduled for May 25th, 2022, this will be the subject's End of Study Visit.

## 2021-01-31 DIAGNOSIS — E538 Deficiency of other specified B group vitamins: Secondary | ICD-10-CM | POA: Diagnosis not present

## 2021-02-02 ENCOUNTER — Other Ambulatory Visit: Payer: Self-pay

## 2021-02-02 ENCOUNTER — Encounter: Payer: Medicare HMO | Admitting: *Deleted

## 2021-02-02 VITALS — BP 158/80 | HR 92

## 2021-02-02 DIAGNOSIS — Z006 Encounter for examination for normal comparison and control in clinical research program: Secondary | ICD-10-CM

## 2021-02-02 NOTE — Research (Signed)
Subject came into the research clinic today for their End of Study Visit in the CLEAR research trail. Subject's concomitant medications have been reviewed and updated in [facility] if applicable. There are no new AE's or SAE's to report to sponsor at this time. The subject was 95% compliant with IP since last visit. During this appointment the subject had an EKG that was completed and their physical exam was performed by the Sub-I of the study. The post End of Study phone call will be in approximately 30 days from today.

## 2021-02-09 ENCOUNTER — Encounter (INDEPENDENT_AMBULATORY_CARE_PROVIDER_SITE_OTHER): Payer: Medicare HMO | Admitting: Ophthalmology

## 2021-02-09 ENCOUNTER — Other Ambulatory Visit: Payer: Self-pay

## 2021-02-09 DIAGNOSIS — H353132 Nonexudative age-related macular degeneration, bilateral, intermediate dry stage: Secondary | ICD-10-CM | POA: Diagnosis not present

## 2021-02-09 DIAGNOSIS — I10 Essential (primary) hypertension: Secondary | ICD-10-CM | POA: Diagnosis not present

## 2021-02-09 DIAGNOSIS — H43813 Vitreous degeneration, bilateral: Secondary | ICD-10-CM | POA: Diagnosis not present

## 2021-02-09 DIAGNOSIS — H35033 Hypertensive retinopathy, bilateral: Secondary | ICD-10-CM

## 2021-03-01 DIAGNOSIS — E538 Deficiency of other specified B group vitamins: Secondary | ICD-10-CM | POA: Diagnosis not present

## 2021-03-02 ENCOUNTER — Telehealth: Payer: Self-pay | Admitting: *Deleted

## 2021-03-02 NOTE — Telephone Encounter (Signed)
Just completed the Post End of Study phone call with subject for the CLEAR research study. Subject's concomitant medications were reviewed and updated if applicable. There are no new AE's or SAE's to report to sponsor at this time.

## 2021-03-03 NOTE — Addendum Note (Signed)
Addended by: Loletha Carrow D on: 03/03/2021 09:50 AM   Modules accepted: Orders

## 2021-03-03 NOTE — Addendum Note (Signed)
Addended by: Gustavo Lah C on: 03/03/2021 10:12 AM   Modules accepted: Orders

## 2021-03-03 NOTE — Addendum Note (Signed)
Addended by: Loletha Carrow D on: 03/03/2021 09:55 AM   Modules accepted: Orders

## 2021-03-17 ENCOUNTER — Telehealth: Payer: Self-pay

## 2021-03-17 ENCOUNTER — Telehealth: Payer: Self-pay | Admitting: Cardiovascular Disease

## 2021-03-17 ENCOUNTER — Ambulatory Visit (INDEPENDENT_AMBULATORY_CARE_PROVIDER_SITE_OTHER): Payer: Medicare HMO

## 2021-03-17 DIAGNOSIS — I441 Atrioventricular block, second degree: Secondary | ICD-10-CM | POA: Diagnosis not present

## 2021-03-17 LAB — CUP PACEART REMOTE DEVICE CHECK
Battery Remaining Longevity: 146 mo
Battery Voltage: 3.09 V
Brady Statistic AP VP Percent: 0.03 %
Brady Statistic AP VS Percent: 1.36 %
Brady Statistic AS VP Percent: 2.72 %
Brady Statistic AS VS Percent: 95.89 %
Brady Statistic RA Percent Paced: 1.37 %
Brady Statistic RV Percent Paced: 2.75 %
Date Time Interrogation Session: 20220707074538
Implantable Lead Implant Date: 20210707
Implantable Lead Implant Date: 20210707
Implantable Lead Location: 753859
Implantable Lead Location: 753860
Implantable Lead Model: 5076
Implantable Lead Model: 5076
Implantable Lead Serial Number: 8264443
Implantable Pulse Generator Implant Date: 20210707
Lead Channel Impedance Value: 361 Ohm
Lead Channel Impedance Value: 380 Ohm
Lead Channel Impedance Value: 418 Ohm
Lead Channel Impedance Value: 551 Ohm
Lead Channel Pacing Threshold Amplitude: 0.625 V
Lead Channel Pacing Threshold Amplitude: 0.75 V
Lead Channel Pacing Threshold Pulse Width: 0.4 ms
Lead Channel Pacing Threshold Pulse Width: 0.4 ms
Lead Channel Sensing Intrinsic Amplitude: 3.25 mV
Lead Channel Sensing Intrinsic Amplitude: 3.25 mV
Lead Channel Sensing Intrinsic Amplitude: 6 mV
Lead Channel Sensing Intrinsic Amplitude: 6 mV
Lead Channel Setting Pacing Amplitude: 1.5 V
Lead Channel Setting Pacing Amplitude: 2.5 V
Lead Channel Setting Pacing Pulse Width: 0.4 ms
Lead Channel Setting Sensing Sensitivity: 2.8 mV

## 2021-03-17 NOTE — Telephone Encounter (Signed)
Pt left a message wanting the results of her last remote check.

## 2021-03-17 NOTE — Telephone Encounter (Signed)
      Pt c/o medication issue:  1. Name of Medication: bempedoic acid  2. How are you currently taking this medication (dosage and times per day)?   3. Are you having a reaction (difficulty breathing--STAT)?   4. What is your medication issue?  Pt said she was a part of a clinical trial for statin drug and she was given bempedoic acid. She was told by cone that they will send the result to her heart doctor if she was given the placebo or the real medications, she wanted to know if Dr. Clifton James received those information

## 2021-03-17 NOTE — Telephone Encounter (Signed)
Spoke with pt.  Advised most recent results for PPM shows normal device function.  There were a few episodes of NSVT and SVT that will; be reviewed by Dr. Graciela Husbands.  If any issues we will contact her.

## 2021-03-18 NOTE — Telephone Encounter (Signed)
Attempted to reach patient by phone.  No answer, no VM set up.  Unable to leave a message.

## 2021-03-18 NOTE — Telephone Encounter (Signed)
PharmDs have not gotten any info about her. For other patients I've enrolled, I've received an email from Dr Riley Kill notifying me of their end of study visit but it didn't have info as to which arm the pt was in. Would see if Dr Clifton James received any info. Essentially need updated lipid panel to see if she needs additional medication adjustments.

## 2021-03-22 ENCOUNTER — Emergency Department (HOSPITAL_COMMUNITY)
Admission: EM | Admit: 2021-03-22 | Discharge: 2021-03-23 | Disposition: A | Discharge status: Home / Self Care | Payer: Medicare HMO | Attending: Emergency Medicine | Admitting: Emergency Medicine

## 2021-03-22 ENCOUNTER — Other Ambulatory Visit: Payer: Self-pay

## 2021-03-22 ENCOUNTER — Emergency Department (HOSPITAL_COMMUNITY): Payer: Medicare HMO

## 2021-03-22 ENCOUNTER — Encounter (HOSPITAL_COMMUNITY): Payer: Self-pay | Admitting: Emergency Medicine

## 2021-03-22 DIAGNOSIS — Z20822 Contact with and (suspected) exposure to covid-19: Secondary | ICD-10-CM | POA: Insufficient documentation

## 2021-03-22 DIAGNOSIS — I5032 Chronic diastolic (congestive) heart failure: Secondary | ICD-10-CM | POA: Insufficient documentation

## 2021-03-22 DIAGNOSIS — R7989 Other specified abnormal findings of blood chemistry: Secondary | ICD-10-CM | POA: Diagnosis not present

## 2021-03-22 DIAGNOSIS — R000 Tachycardia, unspecified: Secondary | ICD-10-CM | POA: Insufficient documentation

## 2021-03-22 DIAGNOSIS — E039 Hypothyroidism, unspecified: Secondary | ICD-10-CM | POA: Insufficient documentation

## 2021-03-22 DIAGNOSIS — Z79899 Other long term (current) drug therapy: Secondary | ICD-10-CM | POA: Diagnosis not present

## 2021-03-22 DIAGNOSIS — Z951 Presence of aortocoronary bypass graft: Secondary | ICD-10-CM | POA: Diagnosis not present

## 2021-03-22 DIAGNOSIS — Z7982 Long term (current) use of aspirin: Secondary | ICD-10-CM | POA: Diagnosis not present

## 2021-03-22 DIAGNOSIS — R519 Headache, unspecified: Secondary | ICD-10-CM | POA: Insufficient documentation

## 2021-03-22 DIAGNOSIS — M7989 Other specified soft tissue disorders: Secondary | ICD-10-CM | POA: Diagnosis not present

## 2021-03-22 DIAGNOSIS — R2242 Localized swelling, mass and lump, left lower limb: Secondary | ICD-10-CM | POA: Diagnosis not present

## 2021-03-22 DIAGNOSIS — Z91040 Latex allergy status: Secondary | ICD-10-CM | POA: Insufficient documentation

## 2021-03-22 DIAGNOSIS — J45909 Unspecified asthma, uncomplicated: Secondary | ICD-10-CM | POA: Diagnosis not present

## 2021-03-22 DIAGNOSIS — M2550 Pain in unspecified joint: Secondary | ICD-10-CM | POA: Diagnosis not present

## 2021-03-22 DIAGNOSIS — Z950 Presence of cardiac pacemaker: Secondary | ICD-10-CM | POA: Insufficient documentation

## 2021-03-22 DIAGNOSIS — R0602 Shortness of breath: Secondary | ICD-10-CM | POA: Diagnosis not present

## 2021-03-22 DIAGNOSIS — I2510 Atherosclerotic heart disease of native coronary artery without angina pectoris: Secondary | ICD-10-CM | POA: Diagnosis not present

## 2021-03-22 DIAGNOSIS — Z87891 Personal history of nicotine dependence: Secondary | ICD-10-CM | POA: Diagnosis not present

## 2021-03-22 DIAGNOSIS — R5383 Other fatigue: Secondary | ICD-10-CM | POA: Diagnosis not present

## 2021-03-22 DIAGNOSIS — K760 Fatty (change of) liver, not elsewhere classified: Secondary | ICD-10-CM | POA: Diagnosis not present

## 2021-03-22 DIAGNOSIS — J320 Chronic maxillary sinusitis: Secondary | ICD-10-CM | POA: Diagnosis not present

## 2021-03-22 DIAGNOSIS — D510 Vitamin B12 deficiency anemia due to intrinsic factor deficiency: Secondary | ICD-10-CM | POA: Diagnosis not present

## 2021-03-22 DIAGNOSIS — I110 Hypertensive heart disease with heart failure: Secondary | ICD-10-CM | POA: Diagnosis not present

## 2021-03-22 DIAGNOSIS — R609 Edema, unspecified: Secondary | ICD-10-CM | POA: Diagnosis not present

## 2021-03-22 LAB — CBC WITH DIFFERENTIAL/PLATELET
Abs Immature Granulocytes: 0.03 10*3/uL (ref 0.00–0.07)
Basophils Absolute: 0.1 10*3/uL (ref 0.0–0.1)
Basophils Relative: 1 %
Eosinophils Absolute: 0.1 10*3/uL (ref 0.0–0.5)
Eosinophils Relative: 1 %
HCT: 42 % (ref 36.0–46.0)
Hemoglobin: 13.8 g/dL (ref 12.0–15.0)
Immature Granulocytes: 0 %
Lymphocytes Relative: 21 %
Lymphs Abs: 2.4 10*3/uL (ref 0.7–4.0)
MCH: 30.9 pg (ref 26.0–34.0)
MCHC: 32.9 g/dL (ref 30.0–36.0)
MCV: 94.2 fL (ref 80.0–100.0)
Monocytes Absolute: 0.8 10*3/uL (ref 0.1–1.0)
Monocytes Relative: 7 %
Neutro Abs: 8 10*3/uL — ABNORMAL HIGH (ref 1.7–7.7)
Neutrophils Relative %: 70 %
Platelets: 271 10*3/uL (ref 150–400)
RBC: 4.46 MIL/uL (ref 3.87–5.11)
RDW: 13.3 % (ref 11.5–15.5)
WBC: 11.4 10*3/uL — ABNORMAL HIGH (ref 4.0–10.5)
nRBC: 0 % (ref 0.0–0.2)

## 2021-03-22 LAB — COMPREHENSIVE METABOLIC PANEL
ALT: 41 U/L (ref 0–44)
AST: 36 U/L (ref 15–41)
Albumin: 3.8 g/dL (ref 3.5–5.0)
Alkaline Phosphatase: 83 U/L (ref 38–126)
Anion gap: 8 (ref 5–15)
BUN: 20 mg/dL (ref 8–23)
CO2: 26 mmol/L (ref 22–32)
Calcium: 9.7 mg/dL (ref 8.9–10.3)
Chloride: 105 mmol/L (ref 98–111)
Creatinine, Ser: 1.35 mg/dL — ABNORMAL HIGH (ref 0.44–1.00)
GFR, Estimated: 41 mL/min — ABNORMAL LOW (ref >60)
Glucose, Bld: 122 mg/dL — ABNORMAL HIGH (ref 70–99)
Potassium: 4.2 mmol/L (ref 3.5–5.1)
Sodium: 139 mmol/L (ref 135–145)
Total Bilirubin: 0.8 mg/dL (ref 0.3–1.2)
Total Protein: 7.4 g/dL (ref 6.5–8.1)

## 2021-03-22 LAB — TROPONIN I (HIGH SENSITIVITY)
Troponin I (High Sensitivity): 7 ng/L (ref <18)
Troponin I (High Sensitivity): 6 ng/L (ref <18)

## 2021-03-22 LAB — T4, FREE: Free T4: 1.22 ng/dL — ABNORMAL HIGH (ref 0.61–1.12)

## 2021-03-22 LAB — LACTIC ACID, PLASMA: Lactic Acid, Venous: 1.6 mmol/L (ref 0.5–1.9)

## 2021-03-22 LAB — TSH: TSH: 8.387 u[IU]/mL — ABNORMAL HIGH (ref 0.350–4.500)

## 2021-03-22 LAB — D-DIMER, QUANTITATIVE: D-Dimer, Quant: 0.72 ug/mL-FEU — ABNORMAL HIGH (ref 0.00–0.50)

## 2021-03-22 LAB — BRAIN NATRIURETIC PEPTIDE: B Natriuretic Peptide: 93 pg/mL (ref 0.0–100.0)

## 2021-03-22 MED ORDER — IOHEXOL 350 MG/ML SOLN
100.0000 mL | Freq: Once | INTRAVENOUS | Status: AC | PRN
  Administered 2021-03-22: 100 mL via INTRAVENOUS

## 2021-03-22 NOTE — ED Notes (Signed)
Pt ambulated to bathroom, c/o shortness of breath. SpO2 97% RA.

## 2021-03-22 NOTE — ED Provider Notes (Signed)
[facility] EMERGENCY DEPARTMENT Provider Note   CSN: [id number] Arrival date & time: 03/22/21  1720     History Chief Complaint  Patient presents with   Abnormal Lab    Lyra Alaimo Nestle is a 75 y.o. female.  HPI Patient is a 75 year old female with a past medical history significant for CHF, CAD, hyperlipidemia, HTN, hypothyroidism now on levothyroxine, mitral regurg, Mobitz type II with pacemaker  Patient is presented today with several symptoms.  She seems to be primarily complaining of feeling bad.  Seems to be combination of fatigue, low energy, decreased exercise tolerance, shortness of breath, left leg pain and swelling.  She states that her left leg has been an intermittent issue for the past year or more.  She states that it is more swollen and painful.  She states that she [place] and that it has been more swollen ever since.  She is uncertain of when this was.  She is also complaining of shortness of breath that she states is been ongoing for 4 months.  She states that it is more exertional.  She does not complain of any orthopnea or PND.  She also complains of some chest pain although she states she is does not develop pain and feels that it is related to reflux.  She describes it as more of a pressure discomfort.  She states that she currently does not have any chest pain. She states that seems to be intermittent has been going on for several months.  She is also complaining of headache.  She states that it is achy and circumferential.  No trauma.     Past Medical History:  Diagnosis Date   Asthma    Chronic diastolic CHF (congestive heart failure) (HCC)    a. 02/2011 Echo: EF 55-60%, Gr2 DD, Mild MR   Coronary artery disease    a. s/p CABG x 1 2010:  LIMA->LAD.;  b. amdx for CP => LHC 07/04/12: LAD 70-80%, mid RCA 30%, LIMA-LAD patent with competitive flow limiting distal LAD filling, EF 70% with hyperdynamic LV function.  Medical therapy continued.   Dyslipidemia    GERD (gastroesophageal reflux disease)    Hyperlipidemia    Hypertension    Hyperthyroidism    Mitral regurgitation    a. mild by echo 02/2011.    Patient Active Problem List   Diagnosis Date Noted   Pacemaker - MDT 06/21/2020   Myalgia 03/25/2020   Mobitz type 2 second degree heart block 03/18/2020   AVD (aortic valve disease)    Hypothyroidism 01/23/2020   Weight gain 02/08/2018   Chronic diastolic CHF (congestive heart failure) (HCC) 07/30/2017   Chest pain, mid sternal 07/05/2012   Acid reflux    Coronary artery disease    Hyperlipidemia    Hypertension    Heart murmur     Past Surgical History:  Procedure Laterality Date   ABDOMINAL HYSTERECTOMY  1980   CARDIAC CATHETERIZATION  07/27/09 & 07/28/09   CESAREAN SECTION     CORONARY ARTERY BYPASS GRAFT  08/03/2009   x1 using left internal mammary artery to distal left anterior  descending coronary artery.    GALLBLADDER SURGERY  2001   LEFT HEART CATHETERIZATION WITH CORONARY/GRAFT ANGIOGRAM N/A 07/05/2012   Procedure: LEFT HEART CATHETERIZATION WITH Isabel Caprice;  Surgeon: Wendall Stade, MD;  Location: [facility] CATH LAB;  Service: Cardiovascular;  Laterality: N/A;   PACEMAKER IMPLANT N/A 03/17/2020   Procedure: PACEMAKER  IMPLANT;  Surgeon: Duke Salvia, MD;  Location: [facility] INVASIVE CV LAB;  Service: Cardiovascular;  Laterality: N/A;   RIGHT/LEFT HEART CATH AND CORONARY/GRAFT ANGIOGRAPHY N/A 03/17/2020   Procedure: RIGHT/LEFT HEART CATH AND CORONARY/GRAFT ANGIOGRAPHY;  Surgeon: Iran Ouch, MD;  Location: [facility] INVASIVE CV LAB;  Service: Cardiovascular;  Laterality: N/A;   SHOULDER SURGERY  1998 / 2001   from accident   VESICOVAGINAL FISTULA CLOSURE W/ TAH  1998     OB History     Gravida  3   Para  3   Term      Preterm      AB      Living  3      SAB      IAB      Ectopic      Multiple      Live Births              Family History   Problem Relation Age of Onset   Cancer Father    Heart attack Father    High blood pressure Father    High Cholesterol Father    Heart disease Father    Alcoholism Father    Obesity Father    Heart attack Mother    Heart disease Mother        had pacemaker   High blood pressure Mother    High Cholesterol Mother    Obesity Mother    Cancer Brother    Heart disease Brother    Heart disease Sister    Cancer Sister    Angina Sister    Coronary artery disease Son    Hypertension Sister    Hypertension Brother    Thyroid disease Neg Hx     Social History   Tobacco Use   Smoking status: Former    Years: 7.00    Pack years: 0.00    Types: Cigarettes    Quit date: 07/12/2010    Years since quitting: 10.7   Smokeless tobacco: Never  Vaping Use   Vaping Use: Never used  Substance Use Topics   Alcohol use: No   Drug use: No    Home Medications Prior to Admission medications   Medication Sig Start Date End Date Taking? Authorizing Provider  acetaminophen (TYLENOL) 325 MG tablet Take 650 mg by mouth every 6 (six) hours as needed for headache or mild pain.   Yes [provider]  albuterol (VENTOLIN HFA) 108 (90 Base) MCG/ACT inhaler Inhale 2 puffs into the lungs every 4 (four) hours as needed for wheezing or shortness of breath (asthma).  03/12/20  Yes [provider]  aspirin EC 81 MG tablet Take 81 mg by mouth at bedtime. Swallow whole.   Yes [provider]  Cholecalciferol (VITAMIN D) 50 MCG (2000 UT) CAPS Take 2,000 Units by mouth daily.    Yes [provider]  cyanocobalamin (,VITAMIN B-12,) 1000 MCG/ML injection Inject 1,000 mcg into the muscle every 30 (thirty) days.   Yes [provider]  furosemide (LASIX) 20 MG tablet Take 1 tablet (20 mg total) by mouth daily. Patient taking differently: Take 40 mg by mouth 2 (two) times daily. 04/12/20  Yes Kathleene Hazel, MD  levothyroxine (SYNTHROID) 50 MCG tablet Take 50 mcg by  mouth every morning. 04/19/20  Yes [provider]  lisinopril (ZESTRIL) 10 MG tablet TAKE 1 TABLET BY MOUTH EVERY DAY Patient taking differently: Take 10 mg by mouth daily. 12/22/20  Yes McAlhany,  Nile Dear, MD  loratadine (CLARITIN) 10 MG tablet Take 10 mg by mouth daily.   Yes [provider]  Melatonin 10 MG TABS Take 10 mg by mouth at bedtime as needed (sleep).   Yes [provider]  Multiple Vitamin (MULTIVITAMIN WITH MINERALS) TABS tablet Take 1 tablet by mouth daily.   Yes [provider]  Multiple Vitamins-Minerals (PRESERVISION AREDS 2) CAPS Take 1 capsule by mouth 2 (two) times daily.   Yes [provider]  nitroGLYCERIN (NITROSTAT) 0.4 MG SL tablet Place 1 tablet (0.4 mg total) under the tongue every 5 (five) minutes as needed for chest pain. 06/28/20  Yes Kathleene Hazel, MD    Allergies    Ceclor [cefaclor], Hctz [hydrochlorothiazide], Lipitor [atorvastatin calcium], Norvasc [amlodipine], Penicillins, Sulfa antibiotics, Statins, Milk-related compounds, Erythromycin, Latex, Levofloxacin, and Tetracyclines & related  Review of Systems   Review of Systems  Constitutional:  Positive for fatigue. Negative for chills and fever.  HENT:  Negative for congestion.   Eyes:  Negative for pain.  Respiratory:  Positive for shortness of breath. Negative for cough.   Cardiovascular:  Positive for chest pain (intermittent). Negative for leg swelling.  Gastrointestinal:  Negative for abdominal pain, diarrhea, nausea and vomiting.  Genitourinary:  Negative for dysuria.  Musculoskeletal:  Negative for myalgias.  Skin:  Negative for rash.  Neurological:  Positive for headaches. Negative for dizziness.   Physical Exam Updated Vital Signs BP (!) 166/82   Pulse 93   Temp 98.2 F (36.8 C) (Oral)   Resp 20   Ht 5\' 2"  (1.575 m)   Wt 92.5 kg   SpO2 94%   BMI 37.31 kg/m   Physical Exam Vitals and nursing note reviewed.  Constitutional:       General: She is not in acute distress. HENT:     Head: Normocephalic and atraumatic.     Nose: Nose normal.     Mouth/Throat:     Mouth: Mucous membranes are moist.  Eyes:     General: No scleral icterus. Cardiovascular:     Rate and Rhythm: Regular rhythm. Tachycardia present.     Pulses: Normal pulses.     Heart sounds: Normal heart sounds.     Comments: 106 HR Pulmonary:     Effort: Pulmonary effort is normal. No respiratory distress.     Breath sounds: No wheezing.  Abdominal:     Palpations: Abdomen is soft.     Tenderness: There is no abdominal tenderness. There is no guarding or rebound.  Musculoskeletal:     Cervical back: Normal range of motion.     Right lower leg: No edema.     Left lower leg: No edema.  Skin:    General: Skin is warm and dry.     Capillary Refill: Capillary refill takes less than 2 seconds.  Neurological:     Mental Status: She is alert. Mental status is at baseline.     Comments: Alert and oriented to self, place, time and event.   Speech is fluent, clear without dysarthria or dysphasia.   Strength 5/5 in upper/lower extremities   Sensation intact in upper/lower extremities   Normal gait.  Normal finger-to-nose CN I not tested  CN II grossly intact visual fields bilaterally. Did not visualize posterior eye.  CN III, IV, VI PERRLA and EOMs intact bilaterally  CN V Intact sensation to sharp and light touch to the face  CN VII facial movements symmetric  CN VIII not tested  CN IX, X no uvula deviation, symmetric rise of soft palate  CN XI 5/5 SCM and trapezius strength bilaterally  CN XII Midline tongue protrusion, symmetric L/R movements   Psychiatric:        Mood and Affect: Mood normal.        Behavior: Behavior normal.    ED Results / Procedures / Treatments   Labs (all labs ordered are listed, but only abnormal results are displayed) Labs Reviewed  COMPREHENSIVE METABOLIC PANEL - Abnormal; Notable for the following  components:      Result Value   Glucose, Bld 122 (*)    Creatinine, Ser 1.35 (*)    GFR, Estimated 41 (*)    All other components within normal limits  CBC WITH DIFFERENTIAL/PLATELET - Abnormal; Notable for the following components:   WBC 11.4 (*)    Neutro Abs 8.0 (*)    All other components within normal limits  TSH - Abnormal; Notable for the following components:   TSH 8.387 (*)    All other components within normal limits  T4, FREE - Abnormal; Notable for the following components:   Free T4 1.22 (*)    All other components within normal limits  D-DIMER, QUANTITATIVE - Abnormal; Notable for the following components:   D-Dimer, Quant 0.72 (*)    All other components within normal limits  RESP PANEL BY RT-PCR (FLU A&B, COVID) ARPGX2  LACTIC ACID, PLASMA  BRAIN NATRIURETIC PEPTIDE  TROPONIN I (HIGH SENSITIVITY)  TROPONIN I (HIGH SENSITIVITY)    EKG EKG Interpretation  Date/Time:  Tuesday March 22 2021 18:10:56 EDT Ventricular Rate:  111 PR Interval:  154 QRS Duration: 120 QT Interval:  354 QTC Calculation: 481 R Axis:   47 Text Interpretation: Sinus tachycardia Right bundle branch block Abnormal ECG Confirmed by Marianna Fussykstra, Richard ([id number]) on 03/22/2021 6:28:38 PM  Radiology DG Chest 2 View  Result Date: 03/22/2021 CLINICAL DATA:  Shortness of breath EXAM: CHEST - 2 VIEW COMPARISON:  03/18/2020 FINDINGS: Shallow inspiration with low lung volumes. Bilateral calcified granulomas. No new consolidation or edema. No pleural effusion or pneumothorax. Stable cardiomediastinal contours with normal heart size. Left chest wall dual lead pacemaker. No acute osseous abnormality. Cholecystectomy clips. IMPRESSION: No acute process in the chest. Electronically Signed   By: Guadlupe SpanishPraneil  Patel M.D.   On: 03/22/2021 19:49   CT Head Wo Contrast  Result Date: 03/22/2021 CLINICAL DATA:  New worsening headache EXAM: CT HEAD WITHOUT CONTRAST TECHNIQUE: Contiguous axial images were obtained from the  base of the skull through the vertex without intravenous contrast. COMPARISON:  Jan 13, 2015 FINDINGS: Brain: No significant volume loss for patient age. No evidence of acute large vascular territory infarction, hemorrhage, hydrocephalus, extra-axial collection or mass lesion/mass effect. Vascular: No hyperdense vessel. Atherosclerotic calcifications the internal arteries at skull base. Skull: Normal. Negative for fracture or focal lesion. Sinuses/Orbits: Similar near complete opacification of the left maxillary sinus sclerotic wall thickening. The remainder of the visualized paranasal sinuses and mastoid air cells are predominantly. Prior lens surgery. Other: Cerumen in the right external auditory canal. IMPRESSION: 1. No acute intracranial abnormality. 2. Chronic left maxillary sinusitis. Electronically Signed   By: Maudry MayhewJeffrey  Waltz MD   On: 03/22/2021 23:28   CT Angio Chest PE W/Cm &/Or Wo Cm  Result Date: 03/22/2021 CLINICAL DATA:  75 year old female with positive D-dimer. Concern for pulmonary embolism. EXAM: CT ANGIOGRAPHY CHEST WITH CONTRAST TECHNIQUE: Multidetector CT imaging of the chest was performed using the standard protocol during bolus administration  of intravenous contrast. Multiplanar CT image reconstructions and MIPs were obtained to evaluate the vascular anatomy. CONTRAST:  OMNIPAQUE IOHEXOL 350 MG/ML SOLN COMPARISON:  Chest CT dated 03/14/2020. FINDINGS: Cardiovascular: There is no cardiomegaly or pericardial effusion. There is postsurgical changes of CABG. Pacemaker wires noted. The thoracic aorta is unremarkable. Evaluation of the pulmonary arteries is very limited due to respiratory motion artifact and suboptimal opacification and timing of the contrast. No large or central pulmonary artery embolus identified. Mediastinum/Nodes: Several small scattered calcified hilar and mediastinal granuloma. The esophagus and thyroid gland are grossly unremarkable. No mediastinal fluid collection.  Lungs/Pleura: Small scattered calcified granuloma. The lungs are clear. There is no pleural effusion or pneumothorax. The central airways are patent. Upper Abdomen: Small scattered calcified splenic granuloma. Probable fatty liver. Cholecystectomy. Musculoskeletal: Osteopenia with degenerative changes of the spine. Median sternotomy wires. No acute osseous pathology. Review of the MIP images confirms the above findings. IMPRESSION: No acute intrathoracic pathology. No CT evidence of central pulmonary artery embolus. Electronically Signed   By: Elgie Collard M.D.   On: 03/22/2021 23:27    Procedures Procedures   Medications Ordered in ED Medications  iohexol (OMNIPAQUE) 350 MG/ML injection 100 mL (100 mLs Intravenous Contrast Given 03/22/21 2245)    ED Course  I have reviewed the triage vital signs and the nursing notes.  Pertinent labs & imaging results that were available during my care of the patient were reviewed by me and considered in my medical decision making (see chart for details).  Clinical Course as of 03/23/21 1718  Wed Mar 23, 2021  0024 Your free T4 was 1.22 and 2 her TSH was found to be 8.387  Patient will follow up with PCP for reevaluation of this. [WF]    Clinical Course User Index [WF] Gailen Shelter, [place]   MDM Rules/Calculators/A&P                          Patient is 75 year old female with past medical history detailed in HPI presented today with primarily concern over elevated D-dimer.  She is not having any significant chest pain at this time but is having some shortness of breath.  Discussed D-dimer repeat versus CT PE study we will go ahead and obtain CT PE study.  Given patient's other nebulous symptoms will obtain chest x-ray and CT head.  Physical exam is unremarkable.  Influenza COVID negative.  BNP within normal limits.  D-dimer somewhat elevated this was obtained although CT PE study was ordered already.  Troponin x2 within normal limits, lactic  within normal limits.  TSH and free T4 mildly abnormal she will follow-up with primary care physician for this recheck.  CBC with mild leukocytosis no significant anemia.  CT head unremarkable.  There is no explanation for patient's symptoms today. CT PE study shows no acute intrathoracic pathology.  No significant incidental findings.  I discussed this case with my attending physician who cosigned this note including patient's presenting symptoms, physical exam, and planned diagnostics and interventions. Attending physician stated agreement with plan or made changes to plan which were implemented.   Attending physician assessed patient at bedside.   Patient is ambulatory at time of discharge is tolerating p.o.  Understands need for close follow-up with PCP and return precautions.  Also understands the need to continue take her medications and monitor her symptoms.  Notably patient's tachycardia resolved without intervention.  Wonder if some degree of anxiety may be at  play for patient.  Final Clinical Impression(s) / ED Diagnoses Final diagnoses:  Fatigue, unspecified type  Shortness of breath  Leg swelling    Rx / DC Orders ED Discharge Orders     None        Gailen Shelter, [place] 03/23/21 1722    Rolan Bucco, MD 03/28/21 [phone]

## 2021-03-22 NOTE — ED Provider Notes (Signed)
Emergency Medicine Provider Triage Evaluation Note  Samantha Short , a 75 y.o. female  was evaluated in triage.  Pt complains of patient presents with chief complaint of shortness of breath, states been on for last couple days, states that she follow-up with her primary care doctor today they did some test and they told her that she had a positive D-dimer, states that she has precarious anemia and has not been feeling well for last couple weeks.  Cannot find these results, on patient's chart, patient does not have any records to verify this..  Review of Systems  Positive: Chest pain, shortness of breath Negative: Abdominal pain, nausea vomiting  Physical Exam  BP (!) 143/76 (BP Location: Left Arm)   Pulse (!) 109   Temp 98.2 F (36.8 C) (Oral)   Resp 18   Ht 5\' 2"  (1.575 m)   Wt 92.5 kg   SpO2 100%   BMI 37.31 kg/m  Gen:   Awake, no distress   Resp:  Normal effort  MSK:   Moves extremities without difficulty  Other:    Medical Decision Making  Medically screening exam initiated at 6:26 PM.  Appropriate orders placed.  Samantha Short was informed that the remainder of the evaluation will be completed by another provider, this initial triage assessment does not replace that evaluation, and the importance of remaining in the ED until their evaluation is complete.  Patient presents with shortness of breath chest pain, labwork imaging was ordered, patient need further work-up in the emergency department.   Samantha Shutter, PA-C 03/22/21 1827    05/23/21, DO 03/22/21 1932

## 2021-03-22 NOTE — ED Triage Notes (Signed)
Pt states she went to her PCP today due to feeling terrible, sent due to elevated d-dimer. Pt having chest discomfort but unsure if its indigestion and intermittent SOB. Hx of CABG

## 2021-03-23 LAB — RESP PANEL BY RT-PCR (FLU A&B, COVID) ARPGX2
Influenza A by PCR: NEGATIVE
Influenza B by PCR: NEGATIVE
SARS Coronavirus 2 by RT PCR: NEGATIVE

## 2021-03-23 NOTE — Discharge Instructions (Addendum)
Your CT scan was negative for any pulmonary embolism.  Your head CT scan was without any abnormality as well.  Overall you had a quite reassuring work-up today. Your free T4 was 1.22 and 2 her TSH was found to be 8.387 you should discuss these findings with your primary care provider.  They can trend these values for you to make sure that your thyroid medications are appropriately titrated.  Please continue take your medications as prescribed.  I recommend closely aligned with your primary care provider to continue evaluating her fatigue and ongoing symptoms.

## 2021-03-23 NOTE — ED Notes (Signed)
Pt provided discharge instructions and prescription information. Pt was given the opportunity to ask questions and questions were answered. Discharge signature not obtained in the setting of the COVID-19 pandemic in order to reduce high touch surfaces.  ° °

## 2021-03-28 DIAGNOSIS — R0789 Other chest pain: Secondary | ICD-10-CM | POA: Diagnosis not present

## 2021-03-28 DIAGNOSIS — E538 Deficiency of other specified B group vitamins: Secondary | ICD-10-CM | POA: Diagnosis not present

## 2021-03-28 DIAGNOSIS — J320 Chronic maxillary sinusitis: Secondary | ICD-10-CM | POA: Diagnosis not present

## 2021-03-31 ENCOUNTER — Other Ambulatory Visit: Payer: Self-pay

## 2021-03-31 ENCOUNTER — Encounter: Payer: Self-pay | Admitting: Internal Medicine

## 2021-03-31 ENCOUNTER — Ambulatory Visit (INDEPENDENT_AMBULATORY_CARE_PROVIDER_SITE_OTHER): Payer: Medicare HMO | Admitting: Internal Medicine

## 2021-03-31 VITALS — BP 138/80 | HR 89 | Ht 62.0 in | Wt 205.2 lb

## 2021-03-31 DIAGNOSIS — I5032 Chronic diastolic (congestive) heart failure: Secondary | ICD-10-CM

## 2021-03-31 DIAGNOSIS — I441 Atrioventricular block, second degree: Secondary | ICD-10-CM

## 2021-03-31 DIAGNOSIS — Z950 Presence of cardiac pacemaker: Secondary | ICD-10-CM | POA: Diagnosis not present

## 2021-03-31 MED ORDER — ISOSORBIDE MONONITRATE ER 30 MG PO TB24: 30.0000 mg | ORAL_TABLET | Freq: Every evening | ORAL | 0 refills | Status: DC

## 2021-03-31 NOTE — Patient Instructions (Signed)
Medication Instructions:  Your physician has recommended you make the following change in your medication:   ** Begin Imdur 30mg  - 1 tablet by mouth at bedtime.  *If you need a refill on your cardiac medications before your next appointment, please call your pharmacy*   Lab Work: None ordered.  If you have labs (blood work) drawn today and your tests are completely normal, you will receive your results only by: MyChart Message (if you have MyChart) OR A paper copy in the mail If you have any lab test that is abnormal or we need to change your treatment, we will call you to review the results.   Testing/Procedures: None ordered.    Follow-Up: At [facility], you and your health needs are our priority.  As part of our continuing mission to provide you with exceptional heart care, we have created designated Provider Care Teams.  These Care Teams include your primary Cardiologist (physician) and Advanced Practice Providers (APPs -  Physician Assistants and Nurse Practitioners) who all work together to provide you with the care you need, when you need it.  We recommend signing up for the patient portal called "MyChart".  Sign up information is provided on this After Visit Summary.  MyChart is used to connect with patients for Virtual Visits (Telemedicine).  Patients are able to view lab/test results, encounter notes, upcoming appointments, etc.  Non-urgent messages can be sent to your provider as well.   To learn more about what you can do with MyChart, go to [facility].    Your next appointment:   12 month(s)  The format for your next appointment:   In Person  Provider:   [email or web address], MD

## 2021-03-31 NOTE — Progress Notes (Signed)
Patient Care Team: Mila Palmer, MD as PCP - General (Family Medicine) Kathleene Hazel, MD as PCP - Cardiology (Cardiology)   HPI  Samantha Short is a 75 y.o. female seen in followup for pacemaker [employer] implanted 7/21 for symptomatic second-degree heart block and intermittent complete heart block.  Procedure was complicated by microperforation requiring lead revision.  Coronary artery disease with catheterization 2010 demonstrating LAD proximal stenosis 80% for which she underwent LIMA-to the LAD  DATE TEST EF   7/21 Echo and  65% %   7/21 cMRI  79 catheter % No amyloid  7/21 LHC  LIMA-LAD patent but small LADp20%   Breathlessness is somewhat improved particularly following the dx of Pern Anemia  Some asymmetric edema, intermittent chest pains, notable at night when she is walking to the BR  No palps or syncope   Reviewing imaging studies including CT scan demonstrated chronic granulomatous disease.   Records and Results Reviewed   Past Medical History:  Diagnosis Date   Asthma    Chronic diastolic CHF (congestive heart failure) (HCC)    a. 02/2011 Echo: EF 55-60%, Gr2 DD, Mild MR   Coronary artery disease    a. s/p CABG x 1 2010:  LIMA->LAD.;  b. amdx for CP => LHC 07/04/12: LAD 70-80%, mid RCA 30%, LIMA-LAD patent with competitive flow limiting distal LAD filling, EF 70% with hyperdynamic LV function. Medical therapy continued.   Dyslipidemia    GERD (gastroesophageal reflux disease)    Hyperlipidemia    Hypertension    Hyperthyroidism    Mitral regurgitation    a. mild by echo 02/2011.    Past Surgical History:  Procedure Laterality Date   ABDOMINAL HYSTERECTOMY  1980   CARDIAC CATHETERIZATION  07/27/09 & 07/28/09   CESAREAN SECTION     CORONARY ARTERY BYPASS GRAFT  08/03/2009   x1 using left internal mammary artery to distal left anterior  descending coronary artery.    GALLBLADDER SURGERY  2001   LEFT HEART CATHETERIZATION WITH  CORONARY/GRAFT ANGIOGRAM N/A 07/05/2012   Procedure: LEFT HEART CATHETERIZATION WITH Isabel Caprice;  Surgeon: Wendall Stade, MD;  Location: [facility] CATH LAB;  Service: Cardiovascular;  Laterality: N/A;   PACEMAKER IMPLANT N/A 03/17/2020   Procedure: PACEMAKER IMPLANT;  Surgeon: Duke Salvia, MD;  Location: [facility] INVASIVE CV LAB;  Service: Cardiovascular;  Laterality: N/A;   RIGHT/LEFT HEART CATH AND CORONARY/GRAFT ANGIOGRAPHY N/A 03/17/2020   Procedure: RIGHT/LEFT HEART CATH AND CORONARY/GRAFT ANGIOGRAPHY;  Surgeon: Iran Ouch, MD;  Location: [facility] INVASIVE CV LAB;  Service: Cardiovascular;  Laterality: N/A;   SHOULDER SURGERY  1998 / 2001   from accident   VESICOVAGINAL FISTULA CLOSURE W/ TAH  1998    Current Meds  Medication Sig   acetaminophen (TYLENOL) 325 MG tablet Take 650 mg by mouth every 6 (six) hours as needed for headache or mild pain.   albuterol (VENTOLIN HFA) 108 (90 Base) MCG/ACT inhaler Inhale 2 puffs into the lungs every 4 (four) hours as needed for wheezing or shortness of breath (asthma).    aspirin EC 81 MG tablet Take 81 mg by mouth at bedtime. Swallow whole.   Cholecalciferol (VITAMIN D) 50 MCG (2000 UT) CAPS Take 2,000 Units by mouth daily.    cyanocobalamin (,VITAMIN B-12,) 1000 MCG/ML injection Inject 1,000 mcg into the muscle every 30 (thirty) days.   furosemide (LASIX) 20 MG tablet Take 40 mg by mouth.   levothyroxine (SYNTHROID) 50 MCG tablet Take 50  mcg by mouth every morning.   lisinopril (ZESTRIL) 10 MG tablet TAKE 1 TABLET BY MOUTH EVERY DAY   loratadine (CLARITIN) 10 MG tablet Take 10 mg by mouth daily.   Melatonin 10 MG TABS Take 10 mg by mouth at bedtime as needed (sleep).   Multiple Vitamin (MULTIVITAMIN WITH MINERALS) TABS tablet Take 1 tablet by mouth daily.   Multiple Vitamins-Minerals (PRESERVISION AREDS 2) CAPS Take 1 capsule by mouth 2 (two) times daily.   nitroGLYCERIN (NITROSTAT) 0.4 MG SL tablet Place 1 tablet (0.4 mg total) under the  tongue every 5 (five) minutes as needed for chest pain.    Allergies  Allergen Reactions   Ceclor [Cefaclor] Other (See Comments)    Lost vision in eye   Hctz [Hydrochlorothiazide] Other (See Comments)    Renal insufficiency   Lipitor [Atorvastatin Calcium] Other (See Comments)    Increased A1C   Norvasc [Amlodipine] Other (See Comments)    Makes patient stiff   Penicillins Hives, Shortness Of Breath, Itching and Rash    Has patient had a PCN reaction causing immediate rash, facial/tongue/throat swelling, SOB or lightheadedness with hypotension: Yes Has patient had a PCN reaction causing severe rash involving mucus membranes or skin necrosis: Unk Has patient had a PCN reaction that required hospitalization: No Has patient had a PCN reaction occurring within the last 10 years: No If all of the above answers are "NO", then may proceed with Cephalosporin use.    Sulfa Antibiotics Other (See Comments)    CAUSES SHOCK   Statins Other (See Comments)    MYALGIAS AND WEAKNESS   Milk-Related Compounds Diarrhea and Nausea And Vomiting   Erythromycin Rash   Latex Rash   Levofloxacin Rash   Tetracyclines & Related Rash      Review of Systems negative except from HPI and PMH  Physical Exam  BP 138/80   Pulse 89   Ht 5\' 2"  (1.575 m)   Wt 205 lb 3.2 oz (93.1 kg)   SpO2 99%   BMI 37.53 kg/m   Well developed and well nourished in no acute distress HENT normal Neck supple with JVP-flat Clear Device pocket well healed; without hematoma or erythema.  There is no tethering  Regular rate and rhythm, no gallop 2/6 murmur Abd-soft with active BS No Clubbing cyanosis tr  L>R edema Skin-warm and dry A & Oriented  Grossly normal sensory and motor function  ECG sinus @ 89 17/12/38 RBBB   Estimated Creatinine Clearance: 38.8 mL/min (A) (by C-G formula based on SCr of 1.35 mg/dL (H)).   Assessment and  Plan Heart block-second-degree intermittent    Dyspnea on exertion     Coronary artery disease-single-vessel CABG  Chest pain   Granulomatous lung disease  Obesity   Aortic stenosis-mild   Renal insufficiency Estimated Creatinine Clearance: 33.7 mL/min (A) (by C-G formula based on SCr of 1.63 mg/dL (H)). Grade 3  Dyspnea is improved but still limiting  Having chest pain at night? Angina will undertake 1 week trial ( seeing DR CMac next week) of qhs Imdur 30 to see if it helps   Heart block is variable.  Right now she is conducting with short AV delay, but she paces 30% so there is still significant  intermittent heart block

## 2021-04-08 ENCOUNTER — Encounter: Payer: Self-pay | Admitting: Cardiovascular Disease

## 2021-04-08 ENCOUNTER — Other Ambulatory Visit: Payer: Self-pay

## 2021-04-08 ENCOUNTER — Ambulatory Visit: Payer: Medicare HMO | Admitting: Cardiovascular Disease

## 2021-04-08 VITALS — BP 110/70 | HR 100 | Ht 62.0 in | Wt 204.8 lb

## 2021-04-08 DIAGNOSIS — I5032 Chronic diastolic (congestive) heart failure: Secondary | ICD-10-CM

## 2021-04-08 DIAGNOSIS — E7800 Pure hypercholesterolemia, unspecified: Secondary | ICD-10-CM

## 2021-04-08 DIAGNOSIS — I441 Atrioventricular block, second degree: Secondary | ICD-10-CM

## 2021-04-08 DIAGNOSIS — I350 Nonrheumatic aortic (valve) stenosis: Secondary | ICD-10-CM

## 2021-04-08 DIAGNOSIS — I2510 Atherosclerotic heart disease of native coronary artery without angina pectoris: Secondary | ICD-10-CM

## 2021-04-08 DIAGNOSIS — I340 Nonrheumatic mitral (valve) insufficiency: Secondary | ICD-10-CM

## 2021-04-08 DIAGNOSIS — I10 Essential (primary) hypertension: Secondary | ICD-10-CM | POA: Diagnosis not present

## 2021-04-08 MED ORDER — FUROSEMIDE 40 MG PO TABS: 40.0000 mg | ORAL_TABLET | Freq: Every day | ORAL | 3 refills | Status: DC

## 2021-04-08 MED ORDER — ISOSORBIDE MONONITRATE ER 30 MG PO TB24: 30.0000 mg | ORAL_TABLET | Freq: Every evening | ORAL | 3 refills | Status: DC

## 2021-04-08 MED ORDER — LISINOPRIL 10 MG PO TABS: 10.0000 mg | ORAL_TABLET | Freq: Every day | ORAL | 3 refills | Status: DC

## 2021-04-08 NOTE — Progress Notes (Signed)
Chief Complaint  Patient presents with   Follow-up    CAD    History of Present Illness: 75 yo female with history of CAD s/p 1V CABG in 2010, HTN, HLD, GERD, mitral regurgitation and high grade AV block s/p permanent pacemaker placement here today for cardiac follow up. She was admitted 06/2012 with chest pain and ruled out for MI. Cardiac cath October 2013 with LAD 70-80%, mid RCA 30%, LIMA-LAD patent. She was seen in September 2015 and she c/o dyspnea with ambulation. Lasix was added and at f/u visit here in October 2015 she felt much better. She does not tolerate statins and has not tolerated HCTZ or Norvasc in the past. She stopped Zetia due to cost. She has not wished to consider a PCSK9 inhibitor. Echo May 2020 with LVEF=60-65%. Mild aortic stenosis (mean gradient 16 mmHg). Mild mitral regurgitation. She has been diagnosed with hyperthyroidism and has undergone ablation. Admitted July 2021 with intermittent complete heart block. Cardiac cath with stable CAD, patent LIMA to LAD. Permanent pacemaker placed July 2021. Echo July 2021 with LVEF=60-65%, mild MR, mild AS. NOrmal ABI 2021  She is here today for follow up. The patient denies any dyspnea, palpitations, lower extremity edema, orthopnea, PND, dizziness, near syncope or syncope. She had rare chest pains that are now improved on Imdur.  .     Primary Care Physician: Mila PalmerWolters, Sharon, MD  Past Medical History:  Diagnosis Date   Asthma    Chronic diastolic CHF (congestive heart failure) (HCC)    a. 02/2011 Echo: EF 55-60%, Gr2 DD, Mild MR   Coronary artery disease    a. s/p CABG x 1 2010:  LIMA->LAD.;  b. amdx for CP => LHC 07/04/12: LAD 70-80%, mid RCA 30%, LIMA-LAD patent with competitive flow limiting distal LAD filling, EF 70% with hyperdynamic LV function. Medical therapy continued.   Dyslipidemia    GERD (gastroesophageal reflux disease)    Hyperlipidemia    Hypertension    Hyperthyroidism    Mitral regurgitation    a.  mild by echo 02/2011.    Past Surgical History:  Procedure Laterality Date   ABDOMINAL HYSTERECTOMY  1980   CARDIAC CATHETERIZATION  07/27/09 & 07/28/09   CESAREAN SECTION     CORONARY ARTERY BYPASS GRAFT  08/03/2009   x1 using left internal mammary artery to distal left anterior  descending coronary artery.    GALLBLADDER SURGERY  2001   LEFT HEART CATHETERIZATION WITH CORONARY/GRAFT ANGIOGRAM N/A 07/05/2012   Procedure: LEFT HEART CATHETERIZATION WITH Isabel CapriceORONARY/GRAFT ANGIOGRAM;  Surgeon: Wendall StadePeter C Nishan, MD;  [facility];  Service: Cardiovascular;  Laterality: N/A;   PACEMAKER IMPLANT N/A 03/17/2020   Procedure: PACEMAKER IMPLANT;  Surgeon: Duke SalviaKlein, Steven C, MD;  [facility];  Service: Cardiovascular;  Laterality: N/A;   RIGHT/LEFT HEART CATH AND CORONARY/GRAFT ANGIOGRAPHY N/A 03/17/2020   Procedure: RIGHT/LEFT HEART CATH AND CORONARY/GRAFT ANGIOGRAPHY;  Surgeon: Iran OuchArida, Muhammad A, MD;  Location: [facility] INVASIVE CV LAB;  Service: Cardiovascular;  Laterality: N/A;   SHOULDER SURGERY  1998 / 2001   from accident   VESICOVAGINAL FISTULA CLOSURE W/ TAH  1998    Current Outpatient Medications  Medication Sig Dispense Refill   acetaminophen (TYLENOL) 325 MG tablet Take 650 mg by mouth every 6 (six) hours as needed for headache or mild pain.     albuterol (VENTOLIN HFA) 108 (90 Base) MCG/ACT inhaler Inhale 2 puffs into the lungs every 4 (four) hours as needed for wheezing  or shortness of breath (asthma).      aspirin EC 81 MG tablet Take 81 mg by mouth at bedtime. Swallow whole.     Cholecalciferol (VITAMIN D) 50 MCG (2000 UT) CAPS Take 2,000 Units by mouth daily.      cyanocobalamin (,VITAMIN B-12,) 1000 MCG/ML injection Inject 1,000 mcg into the muscle every 30 (thirty) days.     levothyroxine (SYNTHROID) 50 MCG tablet Take 50 mcg by mouth every morning.     loratadine (CLARITIN) 10 MG tablet Take 10 mg by mouth daily.     Melatonin 10 MG TABS Take 10 mg by mouth at bedtime  as needed (sleep).     Multiple Vitamin (MULTIVITAMIN WITH MINERALS) TABS tablet Take 1 tablet by mouth daily.     Multiple Vitamins-Minerals (PRESERVISION AREDS 2) CAPS Take 1 capsule by mouth 2 (two) times daily.     nitroGLYCERIN (NITROSTAT) 0.4 MG SL tablet Place 1 tablet (0.4 mg total) under the tongue every 5 (five) minutes as needed for chest pain. 25 tablet 3   furosemide (LASIX) 40 MG tablet Take 1 tablet (40 mg total) by mouth daily. 90 tablet 3   isosorbide mononitrate (IMDUR) 30 MG 24 hr tablet Take 1 tablet (30 mg total) by mouth at bedtime. 90 tablet 3   lisinopril (ZESTRIL) 10 MG tablet Take 1 tablet (10 mg total) by mouth daily. 90 tablet 3   No current facility-administered medications for this visit.    Allergies  Allergen Reactions   Ceclor [Cefaclor] Other (See Comments)    Lost vision in eye   Hctz [Hydrochlorothiazide] Other (See Comments)    Renal insufficiency   Lipitor [Atorvastatin Calcium] Other (See Comments)    Increased A1C   Norvasc [Amlodipine] Other (See Comments)    Makes patient stiff   Penicillins Hives, Shortness Of Breath, Itching and Rash    Has patient had a PCN reaction causing immediate rash, facial/tongue/throat swelling, SOB or lightheadedness with hypotension: Yes Has patient had a PCN reaction causing severe rash involving mucus membranes or skin necrosis: Unk Has patient had a PCN reaction that required hospitalization: No Has patient had a PCN reaction occurring within the last 10 years: No If all of the above answers are "NO", then may proceed with Cephalosporin use.    Sulfa Antibiotics Other (See Comments)    CAUSES SHOCK   Statins Other (See Comments)    MYALGIAS AND WEAKNESS   Milk-Related Compounds Diarrhea and Nausea And Vomiting   Erythromycin Rash   Latex Rash   Levofloxacin Rash   Tetracyclines & Related Rash    Social History   Socioeconomic History   Marital status: Widowed    Spouse name: Not on file   Number of  children: 3   Years of education: Not on file   Highest education level: Not on file  Occupational History   Occupation: Retired- Engineer, site  Tobacco Use   Smoking status: Former    Years: 7.00    Types: Cigarettes    Quit date: 07/12/2010    Years since quitting: 10.7   Smokeless tobacco: Never  Vaping Use   Vaping Use: Never used  Substance and Sexual Activity   Alcohol use: No   Drug use: No   Sexual activity: Not on file  Other Topics Concern   Not on file  Social History Narrative   Not on file   Social Determinants of Health   Financial Resource Strain: Not on file  Food Insecurity:  Not on file  Transportation Needs: Not on file  Physical Activity: Not on file  Stress: Not on file  Social Connections: Not on file  Intimate Partner Violence: Not on file    Family History  Problem Relation Age of Onset   Cancer Father    Heart attack Father    High blood pressure Father    High Cholesterol Father    Heart disease Father    Alcoholism Father    Obesity Father    Heart attack Mother    Heart disease Mother        had pacemaker   High blood pressure Mother    High Cholesterol Mother    Obesity Mother    Cancer Brother    Heart disease Brother    Heart disease Sister    Cancer Sister    Angina Sister    Coronary artery disease Son    Hypertension Sister    Hypertension Brother    Thyroid disease Neg Hx     Review of Systems:  As stated in the HPI and otherwise negative.   BP 110/70   Pulse 100   Ht 5\' 2"  (1.575 m)   Wt 204 lb 12.8 oz (92.9 kg)   SpO2 97%   BMI 37.46 kg/m   Physical Examination: General: Well developed, well nourished, NAD  HEENT: OP clear, mucus membranes moist  SKIN: warm, dry. No rashes. Neuro: No focal deficits  Musculoskeletal: Muscle strength 5/5 all ext  Psychiatric: Mood and affect normal  Neck: No JVD, no carotid bruits, no thyromegaly, no lymphadenopathy.  Lungs:Clear bilaterally, no wheezes, rhonci,  crackles Cardiovascular: Regular rate and rhythm. No murmurs, gallops or rubs. Abdomen:Soft. Bowel sounds present. Non-tender.  Extremities: No lower extremity edema. Pulses are 2 + in the bilateral DP/PT.   Echo July 2021:  1. Left ventricular ejection fraction, by estimation, is 60 to 65%. The  left ventricle has normal function. The left ventricle has no regional  wall motion abnormalities. Mild septal-lateral dyssynchrony likely due to  RV pacing. Left ventricular  diastolic parameters are indeterminate.   2. The mitral valve is normal in structure. Mild mitral valve  regurgitation. No evidence of mitral stenosis.   3. The aortic valve is tricuspid. Aortic valve regurgitation is not  visualized. Mild aortic valve stenosis. Aortic valve area, by VTI measures  1.74 cm. Aortic valve mean gradient measures 17.0 mmHg.   4. Right ventricular systolic function is normal. The right ventricular  size is normal. Tricuspid regurgitation signal is inadequate for assessing  PA pressure.   5. The inferior vena cava is normal in size with <50% respiratory  variability, suggesting right atrial pressure of 8 mmHg.   6. Trivial pericardial effusion.    EKG is not ordered today The ekg ordered today demonstrates   Recent Labs: 03/22/2021: ALT 41; B Natriuretic Peptide 93.0; BUN 20; Creatinine, Ser 1.35; Hemoglobin 13.8; Platelets 271; Potassium 4.2; Sodium 139; TSH 8.387   Lipid Panel    Component Value Date/Time   CHOL 148 03/16/2020 0433   CHOL 141 05/06/2018 1203   TRIG 121 03/16/2020 0433   HDL 39 (L) 03/16/2020 0433   HDL 50 05/06/2018 1203   CHOLHDL 3.8 03/16/2020 0433   VLDL 24 03/16/2020 0433   LDLCALC 85 03/16/2020 0433   LDLCALC 77 05/06/2018 1203     Wt Readings from Last 3 Encounters:  04/08/21 204 lb 12.8 oz (92.9 kg)  03/31/21 205 lb 3.2 oz (93.1 kg)  03/22/21 204 lb (92.5 kg)     Other studies Reviewed: Additional studies/ records that were reviewed today include:  . Review of the above records demonstrates:   Assessment and Plan:   1. CAD s/p CABG without angina: She has no chest pain. She has had 1V CABG with LIMA to LAD. Last cath in 2013 with patent LIMA to LAD, minimal disease RCA, no disease Circumflex. She is statin intolerant. Beta blocker stopped due to fatigue. Continue ASA.   2. HTN: BP is well controlled. No changes  3. Hyperlipidemia: She refuses to take statins due to muscle aches. She stopped Zetia due to cost. We discussed PCSK9 inh but she does not wish to consider at this time.  She also discussed this in the lipid clinic again in November 2018. She was in the CLEAR trial  4. Mitral regurgitation: Mild by echo 2021  5. Chronic diastolic CHF: No evidence of volume overload. Continue Lasix.   6. Aortic stenosis: Mild echo by echo in 2021. Repeat echo July 2023.    7. Leg pain: Normal ABI 2021  8. HIgh grade AV block: permanent pacemaker in place and followed by Dr. Graciela Husbands  Current medicines are reviewed at length with the patient today.  The patient does not have concerns regarding medicines.  The following changes have been made:  no change  Labs/ tests ordered today include:   No orders of the defined types were placed in this encounter.  Disposition:   FU with me in 6 months  Signed, Verne Carrow, MD 04/08/2021 11:22 AM    [facility] [address] [place], [place], [place]  [address] Phone: [phone]; Fax: [phone]

## 2021-04-08 NOTE — Progress Notes (Signed)
Remote pacemaker transmission.   

## 2021-04-08 NOTE — Patient Instructions (Addendum)
Medication Instructions:  Your physician recommends that you continue on your current medications as directed. Please refer to the Current Medication list given to you today.  *If you need a refill on your cardiac medications before your next appointment, please call your pharmacy*   Lab Work: None ordered  If you have labs (blood work) drawn today and your tests are completely normal, you will receive your results only by: MyChart Message (if you have MyChart) OR A paper copy in the mail If you have any lab test that is abnormal or we need to change your treatment, we will call you to review the results.   Testing/Procedures: None ordered    Follow-Up: At [facility], you and your health needs are our priority.  As part of our continuing mission to provide you with exceptional heart care, we have created designated Provider Care Teams.  These Care Teams include your primary Cardiologist (physician) and Advanced Practice Providers (APPs -  Physician Assistants and Nurse Practitioners) who all work together to provide you with the care you need, when you need it.  We recommend signing up for the patient portal called "MyChart".  Sign up information is provided on this After Visit Summary.  MyChart is used to connect with patients for Virtual Visits (Telemedicine).  Patients are able to view lab/test results, encounter notes, upcoming appointments, etc.  Non-urgent messages can be sent to your provider as well.   To learn more about what you can do with MyChart, go to [email or web address].    Your next appointment:   12 month(s)  The format for your next appointment:   In Person  Provider:   You may see Christopher McAlhany, MD or one of the following Advanced Practice Providers on your designated Care Team:   Dayna Dunn, PA-C Michele Lenze, PA-C   Other Instructions None   

## 2021-04-13 DIAGNOSIS — J019 Acute sinusitis, unspecified: Secondary | ICD-10-CM | POA: Diagnosis not present

## 2021-04-13 DIAGNOSIS — K219 Gastro-esophageal reflux disease without esophagitis: Secondary | ICD-10-CM | POA: Diagnosis not present

## 2021-04-13 DIAGNOSIS — D510 Vitamin B12 deficiency anemia due to intrinsic factor deficiency: Secondary | ICD-10-CM | POA: Diagnosis not present

## 2021-04-13 DIAGNOSIS — J309 Allergic rhinitis, unspecified: Secondary | ICD-10-CM | POA: Diagnosis not present

## 2021-04-20 NOTE — Telephone Encounter (Signed)
According to patient's allergy list, she does not tolerate statins.  She has completed the research study.   Per last ov note, she stopped Zetia due to cost and had not wished to conisder a PCSK9 inhibitor.   Has seen lipid clinic in the past.

## 2021-05-11 DIAGNOSIS — E538 Deficiency of other specified B group vitamins: Secondary | ICD-10-CM | POA: Diagnosis not present

## 2021-05-13 DIAGNOSIS — E890 Postprocedural hypothyroidism: Secondary | ICD-10-CM | POA: Diagnosis not present

## 2021-05-18 DIAGNOSIS — E890 Postprocedural hypothyroidism: Secondary | ICD-10-CM | POA: Diagnosis not present

## 2021-05-18 DIAGNOSIS — Z8679 Personal history of other diseases of the circulatory system: Secondary | ICD-10-CM | POA: Diagnosis not present

## 2021-05-23 DIAGNOSIS — N183 Chronic kidney disease, stage 3 unspecified: Secondary | ICD-10-CM | POA: Diagnosis not present

## 2021-05-23 DIAGNOSIS — I129 Hypertensive chronic kidney disease with stage 1 through stage 4 chronic kidney disease, or unspecified chronic kidney disease: Secondary | ICD-10-CM | POA: Diagnosis not present

## 2021-05-23 DIAGNOSIS — N2589 Other disorders resulting from impaired renal tubular function: Secondary | ICD-10-CM | POA: Diagnosis not present

## 2021-05-23 DIAGNOSIS — K219 Gastro-esophageal reflux disease without esophagitis: Secondary | ICD-10-CM | POA: Diagnosis not present

## 2021-05-23 DIAGNOSIS — N2581 Secondary hyperparathyroidism of renal origin: Secondary | ICD-10-CM | POA: Diagnosis not present

## 2021-06-09 ENCOUNTER — Ambulatory Visit: Payer: Medicare HMO | Admitting: Physician Assistant

## 2021-06-14 DIAGNOSIS — E7800 Pure hypercholesterolemia, unspecified: Secondary | ICD-10-CM | POA: Diagnosis not present

## 2021-06-14 DIAGNOSIS — E0590 Thyrotoxicosis, unspecified without thyrotoxic crisis or storm: Secondary | ICD-10-CM | POA: Diagnosis not present

## 2021-06-14 DIAGNOSIS — R7303 Prediabetes: Secondary | ICD-10-CM | POA: Diagnosis not present

## 2021-06-14 DIAGNOSIS — Z23 Encounter for immunization: Secondary | ICD-10-CM | POA: Diagnosis not present

## 2021-06-14 DIAGNOSIS — D510 Vitamin B12 deficiency anemia due to intrinsic factor deficiency: Secondary | ICD-10-CM | POA: Diagnosis not present

## 2021-06-16 ENCOUNTER — Ambulatory Visit (INDEPENDENT_AMBULATORY_CARE_PROVIDER_SITE_OTHER): Payer: Medicare HMO

## 2021-06-16 DIAGNOSIS — I441 Atrioventricular block, second degree: Secondary | ICD-10-CM | POA: Diagnosis not present

## 2021-06-16 LAB — CUP PACEART REMOTE DEVICE CHECK
Battery Remaining Longevity: 148 mo
Battery Voltage: 3.06 V
Brady Statistic AP VP Percent: 0.02 %
Brady Statistic AP VS Percent: 1.59 %
Brady Statistic AS VP Percent: 0.08 %
Brady Statistic AS VS Percent: 98.31 %
Brady Statistic RA Percent Paced: 1.6 %
Brady Statistic RV Percent Paced: 0.1 %
Date Time Interrogation Session: 20221006044532
Implantable Lead Implant Date: 20210707
Implantable Lead Implant Date: 20210707
Implantable Lead Location: 753859
Implantable Lead Location: 753860
Implantable Lead Model: 5076
Implantable Lead Model: 5076
Implantable Lead Serial Number: 8264443
Implantable Pulse Generator Implant Date: 20210707
Lead Channel Impedance Value: 361 Ohm
Lead Channel Impedance Value: 380 Ohm
Lead Channel Impedance Value: 437 Ohm
Lead Channel Impedance Value: 475 Ohm
Lead Channel Pacing Threshold Amplitude: 0.625 V
Lead Channel Pacing Threshold Amplitude: 0.75 V
Lead Channel Pacing Threshold Pulse Width: 0.4 ms
Lead Channel Pacing Threshold Pulse Width: 0.4 ms
Lead Channel Sensing Intrinsic Amplitude: 3.5 mV
Lead Channel Sensing Intrinsic Amplitude: 3.5 mV
Lead Channel Sensing Intrinsic Amplitude: 5.625 mV
Lead Channel Sensing Intrinsic Amplitude: 5.625 mV
Lead Channel Setting Pacing Amplitude: 1.5 V
Lead Channel Setting Pacing Amplitude: 2.5 V
Lead Channel Setting Pacing Pulse Width: 0.4 ms
Lead Channel Setting Sensing Sensitivity: 2.8 mV

## 2021-06-24 NOTE — Progress Notes (Signed)
Remote pacemaker transmission.   

## 2021-07-05 DIAGNOSIS — E538 Deficiency of other specified B group vitamins: Secondary | ICD-10-CM | POA: Diagnosis not present

## 2021-07-13 ENCOUNTER — Telehealth: Payer: Self-pay

## 2021-07-13 NOTE — Telephone Encounter (Signed)
Spoke with patient.  She reports having an aching pain in her chest for past week and half.  The pain does not occur when she is sitting, it occurs sometime when she is walking.  She has had some dizziness but not always with the pain.     Manual transmission received.  Normal device function, nothing on report to explain pt symptoms.  In discussion pt reports history of GERD and pernicious anemia with similar symptoms in the past. When she contacted PCP for these previous episodes she was instructed to call us first so she is checking here first.  She will contact PCP for follow-up.

## 2021-07-13 NOTE — Telephone Encounter (Signed)
Pt left a message that she has some discomfort in her chest. When she moves around she has a nagging pain in her chest.  She is also experiencing SOB. She states she is not actively having chest pains right now. When she is sitting she do not have the pain. It only happens when she moves. I asked the patient to send a transmission with her home monitor. Transmission received. I let the patient speak with Amy, rn.

## 2021-08-03 DIAGNOSIS — E538 Deficiency of other specified B group vitamins: Secondary | ICD-10-CM | POA: Diagnosis not present

## 2021-08-29 DIAGNOSIS — E538 Deficiency of other specified B group vitamins: Secondary | ICD-10-CM | POA: Diagnosis not present

## 2021-09-15 ENCOUNTER — Ambulatory Visit (INDEPENDENT_AMBULATORY_CARE_PROVIDER_SITE_OTHER): Payer: Medicare HMO

## 2021-09-15 DIAGNOSIS — I441 Atrioventricular block, second degree: Secondary | ICD-10-CM

## 2021-09-15 LAB — CUP PACEART REMOTE DEVICE CHECK
Battery Remaining Longevity: 144 mo
Battery Voltage: 3.04 V
Brady Statistic AP VP Percent: 1.34 %
Brady Statistic AP VS Percent: 0.99 %
Brady Statistic AS VP Percent: 53.24 %
Brady Statistic AS VS Percent: 44.44 %
Brady Statistic RA Percent Paced: 2.32 %
Brady Statistic RV Percent Paced: 54.58 %
Date Time Interrogation Session: 20230104213647
Implantable Lead Implant Date: 20210707
Implantable Lead Implant Date: 20210707
Implantable Lead Location: 753859
Implantable Lead Location: 753860
Implantable Lead Model: 5076
Implantable Lead Model: 5076
Implantable Lead Serial Number: 8264443
Implantable Pulse Generator Implant Date: 20210707
Lead Channel Impedance Value: 323 Ohm
Lead Channel Impedance Value: 380 Ohm
Lead Channel Impedance Value: 418 Ohm
Lead Channel Impedance Value: 418 Ohm
Lead Channel Pacing Threshold Amplitude: 0.625 V
Lead Channel Pacing Threshold Amplitude: 0.75 V
Lead Channel Pacing Threshold Pulse Width: 0.4 ms
Lead Channel Pacing Threshold Pulse Width: 0.4 ms
Lead Channel Sensing Intrinsic Amplitude: 3.625 mV
Lead Channel Sensing Intrinsic Amplitude: 3.625 mV
Lead Channel Sensing Intrinsic Amplitude: 5.5 mV
Lead Channel Sensing Intrinsic Amplitude: 5.5 mV
Lead Channel Setting Pacing Amplitude: 1.5 V
Lead Channel Setting Pacing Amplitude: 2.5 V
Lead Channel Setting Pacing Pulse Width: 0.4 ms
Lead Channel Setting Sensing Sensitivity: 2.8 mV

## 2021-09-23 DIAGNOSIS — E538 Deficiency of other specified B group vitamins: Secondary | ICD-10-CM | POA: Diagnosis not present

## 2021-09-26 NOTE — Progress Notes (Signed)
Remote pacemaker transmission.   

## 2021-10-28 DIAGNOSIS — E538 Deficiency of other specified B group vitamins: Secondary | ICD-10-CM | POA: Diagnosis not present

## 2021-11-11 DIAGNOSIS — E538 Deficiency of other specified B group vitamins: Secondary | ICD-10-CM | POA: Diagnosis not present

## 2021-11-25 DIAGNOSIS — E538 Deficiency of other specified B group vitamins: Secondary | ICD-10-CM | POA: Diagnosis not present

## 2021-12-02 DIAGNOSIS — Z923 Personal history of irradiation: Secondary | ICD-10-CM | POA: Diagnosis not present

## 2021-12-02 DIAGNOSIS — Z8679 Personal history of other diseases of the circulatory system: Secondary | ICD-10-CM | POA: Diagnosis not present

## 2021-12-02 DIAGNOSIS — Z79890 Hormone replacement therapy (postmenopausal): Secondary | ICD-10-CM | POA: Diagnosis not present

## 2021-12-02 DIAGNOSIS — Z951 Presence of aortocoronary bypass graft: Secondary | ICD-10-CM | POA: Diagnosis not present

## 2021-12-02 DIAGNOSIS — E890 Postprocedural hypothyroidism: Secondary | ICD-10-CM | POA: Diagnosis not present

## 2021-12-09 DIAGNOSIS — E538 Deficiency of other specified B group vitamins: Secondary | ICD-10-CM | POA: Diagnosis not present

## 2021-12-15 ENCOUNTER — Ambulatory Visit (INDEPENDENT_AMBULATORY_CARE_PROVIDER_SITE_OTHER): Payer: Medicare HMO

## 2021-12-15 DIAGNOSIS — I441 Atrioventricular block, second degree: Secondary | ICD-10-CM

## 2021-12-15 LAB — CUP PACEART REMOTE DEVICE CHECK
Battery Remaining Longevity: 144 mo
Battery Voltage: 3.04 V
Brady Statistic AP VP Percent: 0.01 %
Brady Statistic AP VS Percent: 1.8 %
Brady Statistic AS VP Percent: 0.07 %
Brady Statistic AS VS Percent: 98.12 %
Brady Statistic RA Percent Paced: 1.79 %
Brady Statistic RV Percent Paced: 0.08 %
Date Time Interrogation Session: 20230406000328
Implantable Lead Implant Date: 20210707
Implantable Lead Implant Date: 20210707
Implantable Lead Location: 753859
Implantable Lead Location: 753860
Implantable Lead Model: 5076
Implantable Lead Model: 5076
Implantable Lead Serial Number: 8264443
Implantable Pulse Generator Implant Date: 20210707
Lead Channel Impedance Value: 342 Ohm
Lead Channel Impedance Value: 361 Ohm
Lead Channel Impedance Value: 418 Ohm
Lead Channel Impedance Value: 570 Ohm
Lead Channel Pacing Threshold Amplitude: 0.75 V
Lead Channel Pacing Threshold Amplitude: 0.875 V
Lead Channel Pacing Threshold Pulse Width: 0.4 ms
Lead Channel Pacing Threshold Pulse Width: 0.4 ms
Lead Channel Sensing Intrinsic Amplitude: 3.75 mV
Lead Channel Sensing Intrinsic Amplitude: 3.75 mV
Lead Channel Sensing Intrinsic Amplitude: 6.625 mV
Lead Channel Sensing Intrinsic Amplitude: 6.625 mV
Lead Channel Setting Pacing Amplitude: 1.5 V
Lead Channel Setting Pacing Amplitude: 2.5 V
Lead Channel Setting Pacing Pulse Width: 0.4 ms
Lead Channel Setting Sensing Sensitivity: 2.8 mV

## 2021-12-16 ENCOUNTER — Telehealth: Payer: Self-pay

## 2021-12-16 NOTE — Telephone Encounter (Signed)
Spoke with patient informed her that the atrial arhythmia  meant that the top part of her heart sped up for 51min informed her of the date and time patient appreciative of call back.  ?

## 2021-12-16 NOTE — Telephone Encounter (Signed)
The patient called to get the results to her transmission. I told her the nurse will give her a call back at the [phone] number. The patient verbalized understanding. ?

## 2021-12-22 DIAGNOSIS — E538 Deficiency of other specified B group vitamins: Secondary | ICD-10-CM | POA: Diagnosis not present

## 2021-12-30 NOTE — Progress Notes (Signed)
Remote pacemaker transmission.   

## 2022-01-05 DIAGNOSIS — E538 Deficiency of other specified B group vitamins: Secondary | ICD-10-CM | POA: Diagnosis not present

## 2022-01-19 DIAGNOSIS — E538 Deficiency of other specified B group vitamins: Secondary | ICD-10-CM | POA: Diagnosis not present

## 2022-01-23 DIAGNOSIS — I272 Pulmonary hypertension, unspecified: Secondary | ICD-10-CM | POA: Diagnosis not present

## 2022-01-23 DIAGNOSIS — E538 Deficiency of other specified B group vitamins: Secondary | ICD-10-CM | POA: Diagnosis not present

## 2022-01-23 DIAGNOSIS — R7303 Prediabetes: Secondary | ICD-10-CM | POA: Diagnosis not present

## 2022-01-23 DIAGNOSIS — Z1159 Encounter for screening for other viral diseases: Secondary | ICD-10-CM | POA: Diagnosis not present

## 2022-01-23 DIAGNOSIS — E559 Vitamin D deficiency, unspecified: Secondary | ICD-10-CM | POA: Diagnosis not present

## 2022-01-23 DIAGNOSIS — Z79899 Other long term (current) drug therapy: Secondary | ICD-10-CM | POA: Diagnosis not present

## 2022-01-23 DIAGNOSIS — I25118 Atherosclerotic heart disease of native coronary artery with other forms of angina pectoris: Secondary | ICD-10-CM | POA: Diagnosis not present

## 2022-01-23 DIAGNOSIS — I700 Atherosclerosis of aorta: Secondary | ICD-10-CM | POA: Diagnosis not present

## 2022-01-23 DIAGNOSIS — D692 Other nonthrombocytopenic purpura: Secondary | ICD-10-CM | POA: Diagnosis not present

## 2022-01-23 DIAGNOSIS — I10 Essential (primary) hypertension: Secondary | ICD-10-CM | POA: Diagnosis not present

## 2022-01-23 DIAGNOSIS — Z23 Encounter for immunization: Secondary | ICD-10-CM | POA: Diagnosis not present

## 2022-01-23 DIAGNOSIS — Z0000 Encounter for general adult medical examination without abnormal findings: Secondary | ICD-10-CM | POA: Diagnosis not present

## 2022-01-23 DIAGNOSIS — D510 Vitamin B12 deficiency anemia due to intrinsic factor deficiency: Secondary | ICD-10-CM | POA: Diagnosis not present

## 2022-01-23 DIAGNOSIS — M2550 Pain in unspecified joint: Secondary | ICD-10-CM | POA: Diagnosis not present

## 2022-01-30 ENCOUNTER — Other Ambulatory Visit: Payer: Self-pay | Admitting: Family Medicine

## 2022-01-30 DIAGNOSIS — E2839 Other primary ovarian failure: Secondary | ICD-10-CM

## 2022-02-07 DIAGNOSIS — M25551 Pain in right hip: Secondary | ICD-10-CM | POA: Diagnosis not present

## 2022-02-08 ENCOUNTER — Ambulatory Visit
Admission: RE | Admit: 2022-02-08 | Discharge: 2022-02-08 | Disposition: A | Discharge status: Home / Self Care | Payer: Medicare HMO | Source: Ambulatory Visit | Attending: Sports Medicine | Admitting: Sports Medicine

## 2022-02-08 ENCOUNTER — Other Ambulatory Visit: Payer: Self-pay | Admitting: Sports Medicine

## 2022-02-08 DIAGNOSIS — M25551 Pain in right hip: Secondary | ICD-10-CM

## 2022-02-10 ENCOUNTER — Encounter (INDEPENDENT_AMBULATORY_CARE_PROVIDER_SITE_OTHER): Payer: Medicare HMO | Admitting: Ophthalmology

## 2022-02-14 ENCOUNTER — Encounter (INDEPENDENT_AMBULATORY_CARE_PROVIDER_SITE_OTHER): Payer: Medicare HMO | Admitting: Ophthalmology

## 2022-02-14 DIAGNOSIS — I10 Essential (primary) hypertension: Secondary | ICD-10-CM | POA: Diagnosis not present

## 2022-02-14 DIAGNOSIS — H43813 Vitreous degeneration, bilateral: Secondary | ICD-10-CM | POA: Diagnosis not present

## 2022-02-14 DIAGNOSIS — H353132 Nonexudative age-related macular degeneration, bilateral, intermediate dry stage: Secondary | ICD-10-CM | POA: Diagnosis not present

## 2022-02-14 DIAGNOSIS — H35033 Hypertensive retinopathy, bilateral: Secondary | ICD-10-CM

## 2022-02-15 DIAGNOSIS — E890 Postprocedural hypothyroidism: Secondary | ICD-10-CM | POA: Diagnosis not present

## 2022-02-20 DIAGNOSIS — E538 Deficiency of other specified B group vitamins: Secondary | ICD-10-CM | POA: Diagnosis not present

## 2022-02-23 ENCOUNTER — Other Ambulatory Visit: Payer: Self-pay | Admitting: Cardiovascular Disease

## 2022-03-07 ENCOUNTER — Other Ambulatory Visit: Payer: Self-pay | Admitting: Cardiovascular Disease

## 2022-03-07 DIAGNOSIS — M25551 Pain in right hip: Secondary | ICD-10-CM | POA: Diagnosis not present

## 2022-03-07 DIAGNOSIS — R262 Difficulty in walking, not elsewhere classified: Secondary | ICD-10-CM | POA: Diagnosis not present

## 2022-03-07 DIAGNOSIS — M6281 Muscle weakness (generalized): Secondary | ICD-10-CM | POA: Diagnosis not present

## 2022-03-10 DIAGNOSIS — M6281 Muscle weakness (generalized): Secondary | ICD-10-CM | POA: Diagnosis not present

## 2022-03-10 DIAGNOSIS — M25551 Pain in right hip: Secondary | ICD-10-CM | POA: Diagnosis not present

## 2022-03-10 DIAGNOSIS — R262 Difficulty in walking, not elsewhere classified: Secondary | ICD-10-CM | POA: Diagnosis not present

## 2022-03-16 ENCOUNTER — Ambulatory Visit (INDEPENDENT_AMBULATORY_CARE_PROVIDER_SITE_OTHER): Payer: Medicare HMO

## 2022-03-16 DIAGNOSIS — I441 Atrioventricular block, second degree: Secondary | ICD-10-CM

## 2022-03-16 LAB — CUP PACEART REMOTE DEVICE CHECK
Battery Remaining Longevity: 144 mo
Battery Voltage: 3.04 V
Brady Statistic AP VP Percent: 0.02 %
Brady Statistic AP VS Percent: 2.76 %
Brady Statistic AS VP Percent: 0.21 %
Brady Statistic AS VS Percent: 97.01 %
Brady Statistic RA Percent Paced: 2.75 %
Brady Statistic RV Percent Paced: 0.23 %
Date Time Interrogation Session: 20230705235059
Implantable Lead Implant Date: 20210707
Implantable Lead Implant Date: 20210707
Implantable Lead Location: 753859
Implantable Lead Location: 753860
Implantable Lead Model: 5076
Implantable Lead Model: 5076
Implantable Lead Serial Number: 8264443
Implantable Pulse Generator Implant Date: 20210707
Lead Channel Impedance Value: 323 Ohm
Lead Channel Impedance Value: 361 Ohm
Lead Channel Impedance Value: 418 Ohm
Lead Channel Impedance Value: 494 Ohm
Lead Channel Pacing Threshold Amplitude: 0.75 V
Lead Channel Pacing Threshold Amplitude: 0.875 V
Lead Channel Pacing Threshold Pulse Width: 0.4 ms
Lead Channel Pacing Threshold Pulse Width: 0.4 ms
Lead Channel Sensing Intrinsic Amplitude: 3.5 mV
Lead Channel Sensing Intrinsic Amplitude: 3.5 mV
Lead Channel Sensing Intrinsic Amplitude: 5.875 mV
Lead Channel Sensing Intrinsic Amplitude: 5.875 mV
Lead Channel Setting Pacing Amplitude: 1.5 V
Lead Channel Setting Pacing Amplitude: 2.5 V
Lead Channel Setting Pacing Pulse Width: 0.4 ms
Lead Channel Setting Sensing Sensitivity: 2.8 mV

## 2022-03-17 DIAGNOSIS — M6281 Muscle weakness (generalized): Secondary | ICD-10-CM | POA: Diagnosis not present

## 2022-03-17 DIAGNOSIS — M25551 Pain in right hip: Secondary | ICD-10-CM | POA: Diagnosis not present

## 2022-03-17 DIAGNOSIS — R262 Difficulty in walking, not elsewhere classified: Secondary | ICD-10-CM | POA: Diagnosis not present

## 2022-03-20 DIAGNOSIS — M1611 Unilateral primary osteoarthritis, right hip: Secondary | ICD-10-CM | POA: Diagnosis not present

## 2022-03-21 DIAGNOSIS — E538 Deficiency of other specified B group vitamins: Secondary | ICD-10-CM | POA: Diagnosis not present

## 2022-03-22 DIAGNOSIS — M6281 Muscle weakness (generalized): Secondary | ICD-10-CM | POA: Diagnosis not present

## 2022-03-22 DIAGNOSIS — M25551 Pain in right hip: Secondary | ICD-10-CM | POA: Diagnosis not present

## 2022-03-22 DIAGNOSIS — R262 Difficulty in walking, not elsewhere classified: Secondary | ICD-10-CM | POA: Diagnosis not present

## 2022-03-28 DIAGNOSIS — M6281 Muscle weakness (generalized): Secondary | ICD-10-CM | POA: Diagnosis not present

## 2022-03-28 DIAGNOSIS — M25551 Pain in right hip: Secondary | ICD-10-CM | POA: Diagnosis not present

## 2022-03-28 DIAGNOSIS — R262 Difficulty in walking, not elsewhere classified: Secondary | ICD-10-CM | POA: Diagnosis not present

## 2022-04-04 DIAGNOSIS — R262 Difficulty in walking, not elsewhere classified: Secondary | ICD-10-CM | POA: Diagnosis not present

## 2022-04-04 DIAGNOSIS — M25551 Pain in right hip: Secondary | ICD-10-CM | POA: Diagnosis not present

## 2022-04-04 DIAGNOSIS — M6281 Muscle weakness (generalized): Secondary | ICD-10-CM | POA: Diagnosis not present

## 2022-04-04 NOTE — Progress Notes (Signed)
Remote pacemaker transmission.   

## 2022-04-11 DIAGNOSIS — R262 Difficulty in walking, not elsewhere classified: Secondary | ICD-10-CM | POA: Diagnosis not present

## 2022-04-11 DIAGNOSIS — M6281 Muscle weakness (generalized): Secondary | ICD-10-CM | POA: Diagnosis not present

## 2022-04-11 DIAGNOSIS — M25551 Pain in right hip: Secondary | ICD-10-CM | POA: Diagnosis not present

## 2022-04-12 DIAGNOSIS — M25551 Pain in right hip: Secondary | ICD-10-CM | POA: Diagnosis not present

## 2022-04-12 NOTE — Progress Notes (Unsigned)
No chief complaint on file.  History of Present Illness: 76 yo female with history of CAD s/p 1V CABG in 2010, HTN, HLD, GERD, mitral regurgitation and high grade AV block s/p permanent pacemaker placement here today for cardiac follow up. She was admitted 06/2012 with chest pain and ruled out for MI. Cardiac cath October 2013 with LAD 70-80%, mid RCA 30%, LIMA-LAD patent. She was seen in September 2015 and she c/o dyspnea with ambulation. Lasix was added and at f/u visit here in October 2015 she felt much better. She does not tolerate statins and has not tolerated HCTZ or Norvasc in the past. She stopped Zetia due to cost. She has not wished to consider a PCSK9 inhibitor. Echo May 2020 with LVEF=60-65%. Mild aortic stenosis (mean gradient 16 mmHg). Mild mitral regurgitation. She has been diagnosed with hyperthyroidism and has undergone ablation. Admitted July 2021 with intermittent complete heart block. Cardiac cath with stable CAD, patent LIMA to LAD. Permanent pacemaker placed July 2021. Echo July 2021 with LVEF=60-65%, mild MR, mild AS. Normal ABI 2021  She is here today for follow up. The patient denies any chest pain, dyspnea, palpitations, lower extremity edema, orthopnea, PND, dizziness, near syncope or syncope.    Primary Care Physician: Mila Palmer, MD  Past Medical History:  Diagnosis Date   Asthma    Chronic diastolic CHF (congestive heart failure) (HCC)    a. 02/2011 Echo: EF 55-60%, Gr2 DD, Mild MR   Coronary artery disease    a. s/p CABG x 1 2010:  LIMA->LAD.;  b. amdx for CP => LHC 07/04/12: LAD 70-80%, mid RCA 30%, LIMA-LAD patent with competitive flow limiting distal LAD filling, EF 70% with hyperdynamic LV function. Medical therapy continued.   Dyslipidemia    GERD (gastroesophageal reflux disease)    Hyperlipidemia    Hypertension    Hyperthyroidism    Mitral regurgitation    a. mild by echo 02/2011.    Past Surgical History:  Procedure Laterality Date    ABDOMINAL HYSTERECTOMY  1980   CARDIAC CATHETERIZATION  07/27/09 & 07/28/09   CESAREAN SECTION     CORONARY ARTERY BYPASS GRAFT  08/03/2009   x1 using left internal mammary artery to distal left anterior  descending coronary artery.    GALLBLADDER SURGERY  2001   LEFT HEART CATHETERIZATION WITH CORONARY/GRAFT ANGIOGRAM N/A 07/05/2012   Procedure: LEFT HEART CATHETERIZATION WITH Isabel Caprice;  Surgeon: Wendall Stade, MD;  Location: [facility] CATH LAB;  Service: Cardiovascular;  Laterality: N/A;   PACEMAKER IMPLANT N/A 03/17/2020   Procedure: PACEMAKER IMPLANT;  Surgeon: Duke Salvia, MD;  Location: [facility] INVASIVE CV LAB;  Service: Cardiovascular;  Laterality: N/A;   RIGHT/LEFT HEART CATH AND CORONARY/GRAFT ANGIOGRAPHY N/A 03/17/2020   Procedure: RIGHT/LEFT HEART CATH AND CORONARY/GRAFT ANGIOGRAPHY;  Surgeon: Iran Ouch, MD;  Location: [facility] INVASIVE CV LAB;  Service: Cardiovascular;  Laterality: N/A;   SHOULDER SURGERY  1998 / 2001   from accident   VESICOVAGINAL FISTULA CLOSURE W/ TAH  1998    Current Outpatient Medications  Medication Sig Dispense Refill   acetaminophen (TYLENOL) 325 MG tablet Take 650 mg by mouth every 6 (six) hours as needed for headache or mild pain.     albuterol (VENTOLIN HFA) 108 (90 Base) MCG/ACT inhaler Inhale 2 puffs into the lungs every 4 (four) hours as needed for wheezing or shortness of breath (asthma).      aspirin EC 81 MG tablet Take 81 mg by mouth at bedtime.  Swallow whole.     Cholecalciferol (VITAMIN D) 50 MCG (2000 UT) CAPS Take 2,000 Units by mouth daily.      cyanocobalamin (,VITAMIN B-12,) 1000 MCG/ML injection Inject 1,000 mcg into the muscle every 30 (thirty) days.     furosemide (LASIX) 40 MG tablet Take 1 tablet (40 mg total) by mouth daily. 90 tablet 3   isosorbide mononitrate (IMDUR) 30 MG 24 hr tablet Take 1 tablet (30 mg total) by mouth at bedtime. Please keep upcoming appointment for future refills. Thank you. 30 tablet 0    levothyroxine (SYNTHROID) 50 MCG tablet Take 50 mcg by mouth every morning.     lisinopril (ZESTRIL) 10 MG tablet Take 1 tablet (10 mg total) by mouth daily. Please keep upcoming appointment for future refills. Thank you. 30 tablet 0   loratadine (CLARITIN) 10 MG tablet Take 10 mg by mouth daily.     Melatonin 10 MG TABS Take 10 mg by mouth at bedtime as needed (sleep).     Multiple Vitamin (MULTIVITAMIN WITH MINERALS) TABS tablet Take 1 tablet by mouth daily.     Multiple Vitamins-Minerals (PRESERVISION AREDS 2) CAPS Take 1 capsule by mouth 2 (two) times daily.     nitroGLYCERIN (NITROSTAT) 0.4 MG SL tablet PLACE 1 TABLET UNDER THE TONGUE EVERY 5 MINUTES AS NEEDED FOR CHEST PAIN 25 tablet 0   No current facility-administered medications for this visit.    Allergies  Allergen Reactions   Ceclor [Cefaclor] Other (See Comments)    Lost vision in eye   Hctz [Hydrochlorothiazide] Other (See Comments)    Renal insufficiency   Lipitor [Atorvastatin Calcium] Other (See Comments)    Increased A1C   Norvasc [Amlodipine] Other (See Comments)    Makes patient stiff   Penicillins Hives, Shortness Of Breath, Itching and Rash    Has patient had a PCN reaction causing immediate rash, facial/tongue/throat swelling, SOB or lightheadedness with hypotension: Yes Has patient had a PCN reaction causing severe rash involving mucus membranes or skin necrosis: Unk Has patient had a PCN reaction that required hospitalization: No Has patient had a PCN reaction occurring within the last 10 years: No If all of the above answers are "NO", then may proceed with Cephalosporin use.    Sulfa Antibiotics Other (See Comments)    CAUSES SHOCK   Statins Other (See Comments)    MYALGIAS AND WEAKNESS   Milk-Related Compounds Diarrhea and Nausea And Vomiting   Erythromycin Rash   Latex Rash   Levofloxacin Rash   Tetracyclines & Related Rash    Social History   Socioeconomic History   Marital status: Widowed     Spouse name: Not on file   Number of children: 3   Years of education: Not on file   Highest education level: Not on file  Occupational History   Occupation: Retired- Engineer, site  Tobacco Use   Smoking status: Former    Years: 7.00    Types: Cigarettes    Quit date: 07/12/2010    Years since quitting: 11.7   Smokeless tobacco: Never  Vaping Use   Vaping Use: Never used  Substance and Sexual Activity   Alcohol use: No   Drug use: No   Sexual activity: Not on file  Other Topics Concern   Not on file  Social History Narrative   Not on file   Social Determinants of Health   Financial Resource Strain: Not on file  Food Insecurity: Not on file  Transportation Needs: Not on  file  Physical Activity: Not on file  Stress: Not on file  Social Connections: Not on file  Intimate Partner Violence: Not on file    Family History  Problem Relation Age of Onset   Cancer Father    Heart attack Father    High blood pressure Father    High Cholesterol Father    Heart disease Father    Alcoholism Father    Obesity Father    Heart attack Mother    Heart disease Mother        had pacemaker   High blood pressure Mother    High Cholesterol Mother    Obesity Mother    Cancer Brother    Heart disease Brother    Heart disease Sister    Cancer Sister    Angina Sister    Coronary artery disease Son    Hypertension Sister    Hypertension Brother    Thyroid disease Neg Hx     Review of Systems:  As stated in the HPI and otherwise negative.   There were no vitals taken for this visit.  Physical Examination: General: Well developed, well nourished, NAD  HEENT: OP clear, mucus membranes moist  SKIN: warm, dry. No rashes. Neuro: No focal deficits  Musculoskeletal: Muscle strength 5/5 all ext  Psychiatric: Mood and affect normal  Neck: No JVD, no carotid bruits, no thyromegaly, no lymphadenopathy.  Lungs:Clear bilaterally, no wheezes, rhonci, crackles Cardiovascular: Regular  rate and rhythm. No murmurs, gallops or rubs. Abdomen:Soft. Bowel sounds present. Non-tender.  Extremities: No lower extremity edema. Pulses are 2 + in the bilateral DP/PT.  Echo July 2021:  1. Left ventricular ejection fraction, by estimation, is 60 to 65%. The  left ventricle has normal function. The left ventricle has no regional  wall motion abnormalities. Mild septal-lateral dyssynchrony likely due to  RV pacing. Left ventricular  diastolic parameters are indeterminate.   2. The mitral valve is normal in structure. Mild mitral valve  regurgitation. No evidence of mitral stenosis.   3. The aortic valve is tricuspid. Aortic valve regurgitation is not  visualized. Mild aortic valve stenosis. Aortic valve area, by VTI measures  1.74 cm. Aortic valve mean gradient measures 17.0 mmHg.   4. Right ventricular systolic function is normal. The right ventricular  size is normal. Tricuspid regurgitation signal is inadequate for assessing  PA pressure.   5. The inferior vena cava is normal in size with <50% respiratory  variability, suggesting right atrial pressure of 8 mmHg.   6. Trivial pericardial effusion.    EKG is not *** ordered today The ekg ordered today demonstrates   Recent Labs: No results found for requested labs within last 365 days.   Lipid Panel    Component Value Date/Time   CHOL 148 03/16/2020 0433   CHOL 141 05/06/2018 1203   TRIG 121 03/16/2020 0433   HDL 39 (L) 03/16/2020 0433   HDL 50 05/06/2018 1203   CHOLHDL 3.8 03/16/2020 0433   VLDL 24 03/16/2020 0433   LDLCALC 85 03/16/2020 0433   LDLCALC 77 05/06/2018 1203     Wt Readings from Last 3 Encounters:  04/08/21 204 lb 12.8 oz (92.9 kg)  03/31/21 205 lb 3.2 oz (93.1 kg)  03/22/21 204 lb (92.5 kg)     Other studies Reviewed: Additional studies/ records that were reviewed today include: . Review of the above records demonstrates:   Assessment and Plan:   1. CAD s/p CABG without angina: No chest pain.  She has had 1V CABG with LIMA to LAD. Last cath in 2013 with patent LIMA to LAD, minimal disease RCA, no disease Circumflex. She is statin intolerant. Beta blocker stopped due to fatigue. Will continue ASA.   2. HTN: BP is controlled. No changes  3. Hyperlipidemia: She refuses to take statins due to muscle aches. She stopped Zetia due to cost. We discussed PCSK9 inh but she does not wish to consider at this time.  She also discussed this in the lipid clinic again in November 2018. She was in the CLEAR trial  4. Mitral regurgitation: Mild by echo 2021  5. Chronic diastolic CHF: Weight is stable. No volume overload on exam. Will continue Lasix.    6. Aortic stenosis: Mild echo by echo in 2021. *** Repeat echo now.     7. Leg pain: Normal ABI 2021  8. HIgh grade AV block: permanent pacemaker in place and followed by Dr. Graciela Husbands  Current medicines are reviewed at length with the patient today.  The patient does not have concerns regarding medicines.  The following changes have been made:  no change  Labs/ tests ordered today include:   No orders of the defined types were placed in this encounter.   Disposition:   FU with me in 6 months  Signed, Verne Carrow, MD 04/12/2022 2:11 PM    [facility] [address] [place], [place], [place]  [address] Phone: [phone]; Fax: [phone]

## 2022-04-13 ENCOUNTER — Telehealth: Payer: Self-pay

## 2022-04-13 ENCOUNTER — Ambulatory Visit: Payer: Medicare HMO | Admitting: Cardiovascular Disease

## 2022-04-13 ENCOUNTER — Encounter: Payer: Self-pay | Admitting: Cardiovascular Disease

## 2022-04-13 VITALS — BP 138/80 | HR 81 | Ht 62.0 in | Wt 208.6 lb

## 2022-04-13 DIAGNOSIS — I2510 Atherosclerotic heart disease of native coronary artery without angina pectoris: Secondary | ICD-10-CM | POA: Diagnosis not present

## 2022-04-13 DIAGNOSIS — I350 Nonrheumatic aortic (valve) stenosis: Secondary | ICD-10-CM

## 2022-04-13 DIAGNOSIS — Z01810 Encounter for preprocedural cardiovascular examination: Secondary | ICD-10-CM

## 2022-04-13 DIAGNOSIS — I5032 Chronic diastolic (congestive) heart failure: Secondary | ICD-10-CM

## 2022-04-13 DIAGNOSIS — I10 Essential (primary) hypertension: Secondary | ICD-10-CM

## 2022-04-13 DIAGNOSIS — I340 Nonrheumatic mitral (valve) insufficiency: Secondary | ICD-10-CM

## 2022-04-13 DIAGNOSIS — E7800 Pure hypercholesterolemia, unspecified: Secondary | ICD-10-CM

## 2022-04-13 NOTE — Telephone Encounter (Signed)
Patient brought surgical clearance form from [facility] today. Form was completed by Dr. Clifton James clearing patient to have surgery. Form faxed to [facility] @ [phone].

## 2022-04-13 NOTE — Patient Instructions (Signed)
Medication Instructions:  No changes *If you need a refill on your cardiac medications before your next appointment, please call your pharmacy*   Lab Work: none If you have labs (blood work) drawn today and your tests are completely normal, you will receive your results only by: MyChart Message (if you have MyChart) OR A paper copy in the mail If you have any lab test that is abnormal or we need to change your treatment, we will call you to review the results.   Testing/Procedures: Your physician has requested that you have an echocardiogram. Echocardiography is a painless test that uses sound waves to create images of your heart. It provides your doctor with information about the size and shape of your heart and how well your heart's chambers and valves are working. This procedure takes approximately one hour. There are no restrictions for this procedure.   Follow-Up: At [facility], you and your health needs are our priority.  As part of our continuing mission to provide you with exceptional heart care, we have created designated Provider Care Teams.  These Care Teams include your primary Cardiologist (physician) and Advanced Practice Providers (APPs -  Physician Assistants and Nurse Practitioners) who all work together to provide you with the care you need, when you need it.   Your next appointment:   12 month(s)  The format for your next appointment:   In Person  Provider:   Christopher McAlhany, MD     Important Information About Sugar       

## 2022-04-17 ENCOUNTER — Ambulatory Visit (HOSPITAL_COMMUNITY): Payer: Medicare HMO | Attending: Cardiovascular Disease

## 2022-04-17 DIAGNOSIS — I2510 Atherosclerotic heart disease of native coronary artery without angina pectoris: Secondary | ICD-10-CM | POA: Diagnosis not present

## 2022-04-17 DIAGNOSIS — I350 Nonrheumatic aortic (valve) stenosis: Secondary | ICD-10-CM

## 2022-04-17 LAB — ECHOCARDIOGRAM COMPLETE
AR max vel: 1.13 cm2
AV Area VTI: 1.15 cm2
AV Area mean vel: 1.07 cm2
AV Mean grad: 23 mmHg
AV Peak grad: 39.9 mmHg
Ao pk vel: 3.16 m/s
Area-P 1/2: 3.39 cm2
S' Lateral: 2.3 cm

## 2022-04-19 ENCOUNTER — Telehealth: Payer: Self-pay | Admitting: Cardiovascular Disease

## 2022-04-19 DIAGNOSIS — E538 Deficiency of other specified B group vitamins: Secondary | ICD-10-CM | POA: Diagnosis not present

## 2022-04-19 DIAGNOSIS — D510 Vitamin B12 deficiency anemia due to intrinsic factor deficiency: Secondary | ICD-10-CM | POA: Diagnosis not present

## 2022-04-19 NOTE — Telephone Encounter (Signed)
Pt would like a callback regarding ECHO results. Pease advise

## 2022-04-19 NOTE — Telephone Encounter (Signed)
Pt called regarding Echocardiogram test results.  Pt called back and told Dr. Clifton James has not had a chance to review the Echocardiogram, and provide any new orders based on the test results.  Pt told that we will follow up with her as soon as we receive feedback from the providers.    Pt understood, and had no other concerns at this time.

## 2022-04-22 ENCOUNTER — Other Ambulatory Visit: Payer: Self-pay | Admitting: Cardiovascular Disease

## 2022-05-02 DIAGNOSIS — I10 Essential (primary) hypertension: Secondary | ICD-10-CM | POA: Diagnosis not present

## 2022-05-02 DIAGNOSIS — J45909 Unspecified asthma, uncomplicated: Secondary | ICD-10-CM | POA: Diagnosis not present

## 2022-05-02 DIAGNOSIS — E039 Hypothyroidism, unspecified: Secondary | ICD-10-CM | POA: Diagnosis not present

## 2022-05-02 DIAGNOSIS — M1990 Unspecified osteoarthritis, unspecified site: Secondary | ICD-10-CM | POA: Diagnosis not present

## 2022-05-02 DIAGNOSIS — I2510 Atherosclerotic heart disease of native coronary artery without angina pectoris: Secondary | ICD-10-CM | POA: Diagnosis not present

## 2022-05-02 DIAGNOSIS — E7800 Pure hypercholesterolemia, unspecified: Secondary | ICD-10-CM | POA: Diagnosis not present

## 2022-05-02 DIAGNOSIS — N183 Chronic kidney disease, stage 3 unspecified: Secondary | ICD-10-CM | POA: Diagnosis not present

## 2022-05-05 DIAGNOSIS — E890 Postprocedural hypothyroidism: Secondary | ICD-10-CM | POA: Diagnosis not present

## 2022-05-08 DIAGNOSIS — H353132 Nonexudative age-related macular degeneration, bilateral, intermediate dry stage: Secondary | ICD-10-CM | POA: Diagnosis not present

## 2022-05-08 DIAGNOSIS — Z961 Presence of intraocular lens: Secondary | ICD-10-CM | POA: Diagnosis not present

## 2022-05-22 DIAGNOSIS — D510 Vitamin B12 deficiency anemia due to intrinsic factor deficiency: Secondary | ICD-10-CM | POA: Diagnosis not present

## 2022-05-24 ENCOUNTER — Telehealth: Payer: Self-pay | Admitting: *Deleted

## 2022-05-24 NOTE — Telephone Encounter (Signed)
   Pre-operative Risk Assessment    Patient Name: Samantha Short  DOB: 08-12-1946 MRN: [id number]      Request for Surgical Clearance    Procedure:   RIGHT TOTAL HIP ARTHROPLASTY  Date of Surgery:  Clearance 08/30/22                                 Surgeon:  DR. Ollen Gross Surgeon's Group or Practice Name:  [facility] Phone number:  [phone] Fax number:  [phone]   Type of Clearance Requested:   - Pharmacy:  Hold Aspirin NOT INDICATED   Type of Anesthesia:   CHOICE   Additional requests/questions:    [name]   05/24/2022, 12:55 PM

## 2022-05-26 DIAGNOSIS — M1611 Unilateral primary osteoarthritis, right hip: Secondary | ICD-10-CM | POA: Diagnosis not present

## 2022-05-31 NOTE — Telephone Encounter (Signed)
Chart reviewed as part of pre-operative protocol coverage.  Patient has been medically cleared for surgery, just need input on ASA.  She recently seen by Dr. Angelena Form who addressed clearance in his note. He felt patient could be cleared if AS was not severe. ordered a f/u echocardiogram. 2D echo 04/17/22 showed EF 65-70%, G1DD, moderate AS. Per Dr. Angelena Form, She can proceed with her planned surgical procedure. Patient had prior CABG but not PCI, scenario not specifically outlined in our antiplatelet algorithm. Dr. Angelena Form - OK to hold ASA for hip surgery? Hx CABG 2010, last cath 2021 was stable. Please reply to P CV DIV PREOP.

## 2022-06-01 DIAGNOSIS — R221 Localized swelling, mass and lump, neck: Secondary | ICD-10-CM | POA: Diagnosis not present

## 2022-06-01 DIAGNOSIS — Z20822 Contact with and (suspected) exposure to covid-19: Secondary | ICD-10-CM | POA: Diagnosis not present

## 2022-06-01 DIAGNOSIS — J029 Acute pharyngitis, unspecified: Secondary | ICD-10-CM | POA: Diagnosis not present

## 2022-06-01 NOTE — Telephone Encounter (Signed)
   Patient Name: Samantha Short  DOB: December 23, 1945 MRN: [id number]  Primary Cardiologist: Lauree Chandler, MD  Chart reviewed as part of pre-operative protocol coverage. Per Dr. Angelena Form, patient may proceed with surgery as requested. We typically recommend to continue ASA if safe to do so perioperatively, but Dr. Angelena Form does feel that this can be held if needed for surgery (typical duration of holding is 7 days). Will route this bundled recommendation to requesting provider via Epic fax function. Please call with questions.  Charlie Pitter, PA-C 06/01/2022, 11:07 AM

## 2022-06-02 DIAGNOSIS — R221 Localized swelling, mass and lump, neck: Secondary | ICD-10-CM | POA: Diagnosis not present

## 2022-06-15 ENCOUNTER — Ambulatory Visit (INDEPENDENT_AMBULATORY_CARE_PROVIDER_SITE_OTHER): Payer: Medicare HMO

## 2022-06-15 ENCOUNTER — Telehealth: Payer: Self-pay

## 2022-06-15 DIAGNOSIS — I441 Atrioventricular block, second degree: Secondary | ICD-10-CM

## 2022-06-15 LAB — CUP PACEART REMOTE DEVICE CHECK
Battery Remaining Longevity: 142 mo
Battery Voltage: 3.03 V
Brady Statistic AP VP Percent: 0.03 %
Brady Statistic AP VS Percent: 7.18 %
Brady Statistic AS VP Percent: 0.11 %
Brady Statistic AS VS Percent: 92.68 %
Brady Statistic RA Percent Paced: 7.14 %
Brady Statistic RV Percent Paced: 0.17 %
Date Time Interrogation Session: 20231004204112
Implantable Lead Implant Date: 20210707
Implantable Lead Implant Date: 20210707
Implantable Lead Location: 753859
Implantable Lead Location: 753860
Implantable Lead Model: 5076
Implantable Lead Model: 5076
Implantable Lead Serial Number: 8264443
Implantable Pulse Generator Implant Date: 20210707
Lead Channel Impedance Value: 323 Ohm
Lead Channel Impedance Value: 380 Ohm
Lead Channel Impedance Value: 418 Ohm
Lead Channel Impedance Value: 589 Ohm
Lead Channel Pacing Threshold Amplitude: 0.875 V
Lead Channel Pacing Threshold Amplitude: 1 V
Lead Channel Pacing Threshold Pulse Width: 0.4 ms
Lead Channel Pacing Threshold Pulse Width: 0.4 ms
Lead Channel Sensing Intrinsic Amplitude: 3.75 mV
Lead Channel Sensing Intrinsic Amplitude: 3.75 mV
Lead Channel Sensing Intrinsic Amplitude: 5.875 mV
Lead Channel Sensing Intrinsic Amplitude: 5.875 mV
Lead Channel Setting Pacing Amplitude: 2 V
Lead Channel Setting Pacing Amplitude: 2.5 V
Lead Channel Setting Pacing Pulse Width: 0.4 ms
Lead Channel Setting Sensing Sensitivity: 2.8 mV

## 2022-06-15 NOTE — Telephone Encounter (Signed)
Appears to be newly found atrial fibrillation.  18+ hours.  Please advise.  Scheduled remote reviewed. Normal device function.   There were 3 short NSVT arrhythmias detected.  There was 1 fast A&V event detected.  There were 3 atrial arrhythmias detected, the atrial fib burden is 0.8% of the time.  The longest was 18 hours and 21 minutes, ? Berkey, sent to triage.

## 2022-06-20 ENCOUNTER — Encounter: Payer: Medicare HMO | Admitting: Internal Medicine

## 2022-06-22 DIAGNOSIS — E538 Deficiency of other specified B group vitamins: Secondary | ICD-10-CM | POA: Diagnosis not present

## 2022-06-22 NOTE — Progress Notes (Signed)
Remote pacemaker transmission.   

## 2022-06-26 DIAGNOSIS — I129 Hypertensive chronic kidney disease with stage 1 through stage 4 chronic kidney disease, or unspecified chronic kidney disease: Secondary | ICD-10-CM | POA: Diagnosis not present

## 2022-06-26 DIAGNOSIS — N2581 Secondary hyperparathyroidism of renal origin: Secondary | ICD-10-CM | POA: Diagnosis not present

## 2022-06-26 DIAGNOSIS — Z23 Encounter for immunization: Secondary | ICD-10-CM | POA: Diagnosis not present

## 2022-06-26 DIAGNOSIS — K219 Gastro-esophageal reflux disease without esophagitis: Secondary | ICD-10-CM | POA: Diagnosis not present

## 2022-06-26 DIAGNOSIS — N2589 Other disorders resulting from impaired renal tubular function: Secondary | ICD-10-CM | POA: Diagnosis not present

## 2022-06-26 DIAGNOSIS — N189 Chronic kidney disease, unspecified: Secondary | ICD-10-CM | POA: Diagnosis not present

## 2022-06-26 DIAGNOSIS — N183 Chronic kidney disease, stage 3 unspecified: Secondary | ICD-10-CM | POA: Diagnosis not present

## 2022-06-26 NOTE — Telephone Encounter (Signed)
Spoke with patient and that transmission did come through today  as I walked her through it . I let patient know a nurse will give her a call back what to do going forward

## 2022-06-26 NOTE — Telephone Encounter (Signed)
Manual transmission received and presenting rhythm shows normal SR. Continue to monitor as AF duration is <24 hours.

## 2022-06-27 NOTE — Telephone Encounter (Signed)
Pt has some questions about her conversation with the nurse yesterday. She would like for a call back. Her number is [phone].

## 2022-06-27 NOTE — Telephone Encounter (Signed)
Returned call to Pt to answer questions regarding device report.  She indicates understanding about report, but states she has had some chest discomfort.   This has been ongoing for a week or so.  Dr. Angelena Form is primary cardiologist.  Advised would have his nurse follow up with her.

## 2022-06-27 NOTE — Telephone Encounter (Signed)
About 2 weeks ago while swimming and "it really tugged on me"  her daughter said she looked pale.  This relieved completely after about 5 min and she was able to go back swimming.  Since then is noticing tightness. Notices when she first lays down and it goes away after few minutes.  Not SOB.  She has no swelling.  Breathing is difficult when moving around and she is struggling walking because of her hip pain.  She also has constant indigestion.  Has pernicious anemia and has to be careful with any PPIs so does not take them.  She reports being under a lot of stress and thinks the pain is all in her head.   Has brother and sister w stage IV cancer and this is stressing her.   Hip surgery scheduled for December.  She is going to PCP next week for her physical.    Had cath in 2021 and echo this past August.  I wanted to schedule her tomorrow but she didn't feel she needed to come in.  Will see PCP and go from there but I asked her to call if any new symptoms or worsening of current symptoms.   She is aware I will forward to Dr. Angelena Form and if he has further recommendations we will call her back.

## 2022-07-02 ENCOUNTER — Other Ambulatory Visit: Payer: Self-pay | Admitting: Cardiovascular Disease

## 2022-07-12 ENCOUNTER — Encounter: Payer: Self-pay | Admitting: Internal Medicine

## 2022-07-12 ENCOUNTER — Ambulatory Visit: Payer: Medicare HMO | Attending: Internal Medicine | Admitting: Internal Medicine

## 2022-07-12 VITALS — BP 114/76 | HR 77 | Ht 62.0 in | Wt 204.4 lb

## 2022-07-12 DIAGNOSIS — I480 Paroxysmal atrial fibrillation: Secondary | ICD-10-CM

## 2022-07-12 DIAGNOSIS — Z950 Presence of cardiac pacemaker: Secondary | ICD-10-CM

## 2022-07-12 DIAGNOSIS — I441 Atrioventricular block, second degree: Secondary | ICD-10-CM | POA: Diagnosis not present

## 2022-07-12 LAB — CUP PACEART INCLINIC DEVICE CHECK
Battery Remaining Longevity: 143 mo
Battery Voltage: 3.03 V
Brady Statistic AP VP Percent: 0.2 %
Brady Statistic AP VS Percent: 3.51 %
Brady Statistic AS VP Percent: 7.41 %
Brady Statistic AS VS Percent: 88.88 %
Brady Statistic RA Percent Paced: 3.69 %
Brady Statistic RV Percent Paced: 7.6 %
Date Time Interrogation Session: 20231101185213
Implantable Lead Connection Status: 753985
Implantable Lead Connection Status: 753985
Implantable Lead Implant Date: 20210707
Implantable Lead Implant Date: 20210707
Implantable Lead Location: 753859
Implantable Lead Location: 753860
Implantable Lead Model: 5076
Implantable Lead Model: 5076
Implantable Lead Serial Number: 8264443
Implantable Pulse Generator Implant Date: 20210707
Lead Channel Impedance Value: 380 Ohm
Lead Channel Impedance Value: 399 Ohm
Lead Channel Impedance Value: 456 Ohm
Lead Channel Impedance Value: 646 Ohm
Lead Channel Pacing Threshold Amplitude: 0.75 V
Lead Channel Pacing Threshold Amplitude: 0.875 V
Lead Channel Pacing Threshold Amplitude: 0.875 V
Lead Channel Pacing Threshold Amplitude: 1 V
Lead Channel Pacing Threshold Pulse Width: 0.4 ms
Lead Channel Pacing Threshold Pulse Width: 0.4 ms
Lead Channel Pacing Threshold Pulse Width: 0.4 ms
Lead Channel Pacing Threshold Pulse Width: 0.4 ms
Lead Channel Sensing Intrinsic Amplitude: 3 mV
Lead Channel Sensing Intrinsic Amplitude: 5 mV
Lead Channel Sensing Intrinsic Amplitude: 5.375 mV
Lead Channel Sensing Intrinsic Amplitude: 6.25 mV
Lead Channel Setting Pacing Amplitude: 1.75 V
Lead Channel Setting Pacing Amplitude: 2.5 V
Lead Channel Setting Pacing Pulse Width: 0.4 ms
Lead Channel Setting Sensing Sensitivity: 2.8 mV
Zone Setting Status: 755011
Zone Setting Status: 755011

## 2022-07-12 MED ORDER — APIXABAN 5 MG PO TABS: 5.0000 mg | ORAL_TABLET | Freq: Two times a day (BID) | ORAL | 3 refills | Status: DC

## 2022-07-12 MED ORDER — DILTIAZEM HCL ER COATED BEADS 120 MG PO CP24: 120.0000 mg | ORAL_CAPSULE | Freq: Every day | ORAL | 3 refills | Status: DC

## 2022-07-12 NOTE — Progress Notes (Unsigned)
Patient Care Team: Jonathon Jordan, MD as PCP - General (Family Medicine) Burnell Blanks, MD as PCP - Cardiology (Cardiology)   HPI  Samantha Short is a 76 y.o. female seen in followup for pacemaker Medtronic implanted 7/21 for symptomatic second-degree heart block and intermittent complete heart block.  Procedure was complicated by microperforation requiring lead revision.  Coronary artery disease with catheterization 2010 demonstrating LAD proximal stenosis 80% for which she underwent LIMA-to the LAD  Anticipates R Hip replacement  The patient denies chest pain, nocturnal dyspnea, orthopnea or peripheral edema.  There have been no palpitations, lightheadedness or syncope.  Complains of dyspnea on exertion which has been problematic    also fatigue which has been contextually aggravated in the context of caring for her 2 siblings with cancer.  She notes also that her grandchild lives with her, he has autism.    DATE TEST EF   7/21 Echo and  65% %   7/21 cMRI  79 catheter % No amyloid  7/21 LHC  LIMA-LAD patent but small LADp20% Ao Stenosis mean Grad   8/23 Echo 65-70% AoStenosis  Mean Grad 23     Date Cr K Hgb TSH  7/22 1.35 4.2 13.8 8.4          Thromboembolic risk factors ( age  -2, HTN-1, Vasc disease -1, Gender-1) for a CHADSVASc Score of >=5    Records and Results Reviewed   Past Medical History:  Diagnosis Date   Asthma    Chronic diastolic CHF (congestive heart failure) ([place])    a. 02/2011 Echo: EF 55-60%, Gr2 DD, Mild MR   Coronary artery disease    a. s/p CABG x 1 2010:  LIMA->LAD.;  b. amdx for CP => LHC 07/04/12: LAD 70-80%, mid RCA 30%, LIMA-LAD patent with competitive flow limiting distal LAD filling, EF 70% with hyperdynamic LV function. Medical therapy continued.   Dyslipidemia    GERD (gastroesophageal reflux disease)    Hyperlipidemia    Hypertension    Hyperthyroidism    Mitral regurgitation    a. mild by echo 02/2011.    Past  Surgical History:  Procedure Laterality Date   ABDOMINAL HYSTERECTOMY  1980   CARDIAC CATHETERIZATION  07/27/09 & 07/28/09   CESAREAN SECTION     CORONARY ARTERY BYPASS GRAFT  08/03/2009   x1 using left internal mammary artery to distal left anterior  descending coronary artery.    GALLBLADDER SURGERY  2001   LEFT HEART CATHETERIZATION WITH CORONARY/GRAFT ANGIOGRAM N/A 07/05/2012   Procedure: LEFT HEART CATHETERIZATION WITH Beatrix Fetters;  Surgeon: Josue Hector, MD;  Location: [facility] CATH LAB;  Service: Cardiovascular;  Laterality: N/A;   PACEMAKER IMPLANT N/A 03/17/2020   Procedure: PACEMAKER IMPLANT;  Surgeon: Deboraha Sprang, MD;  Location: [facility] CV LAB;  Service: Cardiovascular;  Laterality: N/A;   RIGHT/LEFT HEART CATH AND CORONARY/GRAFT ANGIOGRAPHY N/A 03/17/2020   Procedure: RIGHT/LEFT HEART CATH AND CORONARY/GRAFT ANGIOGRAPHY;  Surgeon: Wellington Hampshire, MD;  Location: [facility] LAB;  Service: Cardiovascular;  Laterality: N/A;   SHOULDER SURGERY  1998 / 2001   from accident   [place]    No outpatient medications have been marked as taking for the 07/12/22 encounter (Appointment) with Deboraha Sprang, MD.    Allergies  Allergen Reactions   Ceclor [Cefaclor] Other (See Comments)    Lost vision in eye   Hctz [Hydrochlorothiazide] Other (See Comments)  Renal insufficiency   Lipitor [Atorvastatin Calcium] Other (See Comments)    Increased A1C   Norvasc [Amlodipine] Other (See Comments)    Makes patient stiff   Penicillins Hives, Shortness Of Breath, Itching and Rash    Has patient had a PCN reaction causing immediate rash, facial/tongue/throat swelling, SOB or lightheadedness with hypotension: Yes Has patient had a PCN reaction causing severe rash involving mucus membranes or skin necrosis: Unk Has patient had a PCN reaction that required hospitalization: No Has patient had a PCN reaction occurring within the last 10 years:  No If all of the above answers are "NO", then may proceed with Cephalosporin use.    Sulfa Antibiotics Other (See Comments)    CAUSES SHOCK   Statins Other (See Comments)    MYALGIAS AND WEAKNESS   Milk-Related Compounds Diarrhea and Nausea And Vomiting   Erythromycin Rash   Latex Rash   Levofloxacin Rash   Tetracyclines & Related Rash      Review of Systems negative except from HPI and PMH  Physical Exam  BP 114/76   Pulse 77   Ht 5\' 2"  (1.575 m)   Wt 204 lb 6.4 oz (92.7 kg)   SpO2 97%   BMI 37.39 kg/m  Well developed and well nourished in no acute distress HENT normal Neck supple with JVP-flat Clear Device pocket well healed; without hematoma or erythema.  There is no tethering  Regular rate and rhythm, no  gallop 2/6 murmur Abd-soft with active BS No Clubbing cyanosis  edema Skin-warm and dry A & Oriented  Grossly normal sensory and motor function  ECG sinus @ 77 17/12/40 RBBB  Device function is normal. Programming changes   See Paceart for details     CrCl cannot be calculated (Patient's most recent lab result is older than the maximum 21 days allowed.).   Assessment and  Plan Heart block-second-degree intermittent    Dyspnea on exertion    Coronary artery disease-single-vessel CABG  Granulomatous lung disease  PVCs  Obesity   Aortic stenosis-mild   Renal insufficiency Estimated Creatinine Clearance: 33.7 mL/min (A) (by C-G formula based on SCr of 1.63 mg/dL (H)).Grade 3  Atrial fibrillation paroxysmal 18.4 hrs duration  Psychosocial stress   Fatigue in context of her psychosocial stress is progressively problematic patient and his situation it is noteworthy that she has been caring for siblings with cancer and has in her home her grandson with autism.  Moreover, she has longstanding sleep disordered breathing and likely has sleep apnea contributing.  She acknowledges depression in the context of her stresses even though she is very "tough "   have recommended a sleep study  Functional capacity is limited but is able to walk in the supermarket albeit with a cart.  No chest pain.  She saw Dr. Elder Love about 3 months ago's assessment that things were going pretty well.  Symptomatic PVCs.  While they are listed on her device as 6/h, during our visit they are much more frequent than that.  We will discontinue her lisinopril and begin her on diltiazem.  Her device also has identified atrial fibrillation of 18+ hours duration.  The Assert 2 data set suggest that the risks of thromboembolism are significantly higher at 24 hours and 18 hours is quite close.  Moreover, data sets which have looked at CHADS-VASc score have suggested that values over 4 or 5 almost make the duration of the atrial fibrillation irrelevant.  With her CHADS-VASc or of greater than or equal  to 5 we have reviewed risks and benefits, we will discontinue her aspirin we will begin her on Eliquis.  I think her procedural risk for her hip replacement however is acceptable not withstanding these adjustments.

## 2022-07-12 NOTE — Patient Instructions (Signed)
Medication Instructions:  Your physician has recommended you make the following change in your medication:   ** Stop Aspirin  ** Stop Lisinopril  ** Start Diltiazem 120mg  - 1 tablet by mouth daily  ** Start Eliquis 5mg  - 1 tablet by mouth twice daily  *If you need a refill on your cardiac medications before your next appointment, please call your pharmacy*   Lab Work: None ordered.  If you have labs (blood work) drawn today and your tests are completely normal, you will receive your results only by: [place] (if you have MyChart) OR A paper copy in the mail If you have any lab test that is abnormal or we need to change your treatment, we will call you to review the results.   Testing/Procedures: Your physician has recommended that you have a sleep study. This test records several body functions during sleep, including: brain activity, eye movement, oxygen and carbon dioxide blood levels, heart rate and rhythm, breathing rate and rhythm, the flow of air through your mouth and nose, snoring, body muscle movements, and chest and belly movement.    Follow-Up: At [facility], you and your health needs are our priority.  As part of our continuing mission to provide you with exceptional heart care, we have created designated Provider Care Teams.  These Care Teams include your primary Cardiologist (physician) and Advanced Practice Providers (APPs -  Physician Assistants and Nurse Practitioners) who all work together to provide you with the care you need, when you need it.  We recommend signing up for the patient portal called "MyChart".  Sign up information is provided on this After Visit Summary.  MyChart is used to connect with patients for Virtual Visits (Telemedicine).  Patients are able to view lab/test results, encounter notes, upcoming appointments, etc.  Non-urgent messages can be sent to your provider as well.   To learn more about what you can do with MyChart, go to  [email or web address].    Your next appointment:   6 months with Dr Olin Pia PA  Important Information About Sugar

## 2022-07-13 DIAGNOSIS — J45909 Unspecified asthma, uncomplicated: Secondary | ICD-10-CM | POA: Diagnosis not present

## 2022-07-13 DIAGNOSIS — M1990 Unspecified osteoarthritis, unspecified site: Secondary | ICD-10-CM | POA: Diagnosis not present

## 2022-07-13 DIAGNOSIS — I25118 Atherosclerotic heart disease of native coronary artery with other forms of angina pectoris: Secondary | ICD-10-CM | POA: Diagnosis not present

## 2022-07-13 DIAGNOSIS — E039 Hypothyroidism, unspecified: Secondary | ICD-10-CM | POA: Diagnosis not present

## 2022-07-13 DIAGNOSIS — I10 Essential (primary) hypertension: Secondary | ICD-10-CM | POA: Diagnosis not present

## 2022-07-13 DIAGNOSIS — E7800 Pure hypercholesterolemia, unspecified: Secondary | ICD-10-CM | POA: Diagnosis not present

## 2022-07-14 DIAGNOSIS — Z23 Encounter for immunization: Secondary | ICD-10-CM | POA: Diagnosis not present

## 2022-07-14 DIAGNOSIS — G479 Sleep disorder, unspecified: Secondary | ICD-10-CM | POA: Diagnosis not present

## 2022-07-14 DIAGNOSIS — I5032 Chronic diastolic (congestive) heart failure: Secondary | ICD-10-CM | POA: Diagnosis not present

## 2022-07-14 DIAGNOSIS — Z950 Presence of cardiac pacemaker: Secondary | ICD-10-CM | POA: Diagnosis not present

## 2022-07-14 DIAGNOSIS — Z01818 Encounter for other preprocedural examination: Secondary | ICD-10-CM | POA: Diagnosis not present

## 2022-07-14 DIAGNOSIS — D510 Vitamin B12 deficiency anemia due to intrinsic factor deficiency: Secondary | ICD-10-CM | POA: Diagnosis not present

## 2022-07-14 DIAGNOSIS — R69 Illness, unspecified: Secondary | ICD-10-CM | POA: Diagnosis not present

## 2022-07-14 DIAGNOSIS — I441 Atrioventricular block, second degree: Secondary | ICD-10-CM | POA: Diagnosis not present

## 2022-07-14 DIAGNOSIS — I130 Hypertensive heart and chronic kidney disease with heart failure and stage 1 through stage 4 chronic kidney disease, or unspecified chronic kidney disease: Secondary | ICD-10-CM | POA: Diagnosis not present

## 2022-07-26 DIAGNOSIS — E538 Deficiency of other specified B group vitamins: Secondary | ICD-10-CM | POA: Diagnosis not present

## 2022-08-14 ENCOUNTER — Encounter: Payer: Self-pay | Admitting: Internal Medicine

## 2022-08-14 NOTE — Progress Notes (Signed)
PERIOPERATIVE PRESCRIPTION FOR IMPLANTED CARDIAC DEVICE PROGRAMMING  Patient Information: Name:  Samantha Short  DOB:  [birth year]  MRN:  [id number]    Planned Procedure:  Right total knee Arthroplasty Anterior approach  Surgeon: Ollen Gross  Date of Procedure: 08-30-22  Cautery will be used.  Position during surgery: N/A   Please send documentation back to:  [facility] (Fax # [phone])   Device Information:  Clinic EP Physician:  Sherryl Manges, MD   Device Type:  Pacemaker Manufacturer and Phone #:  Medtronic: [id number] Pacemaker Dependent?:  No. Date of Last Device Check:  07/12/2022 Normal Device Function?:  Yes.    Electrophysiologist's Recommendations:  Have magnet available. Provide continuous ECG monitoring when magnet is used or reprogramming is to be performed.  Procedure should not interfere with device function.  No device programming or magnet placement needed.  Per Device Clinic Standing Orders, Lenor Coffin, RN  12:04 PM 08/14/2022

## 2022-08-14 NOTE — Patient Instructions (Addendum)
SURGICAL WAITING ROOM VISITATION Patients having surgery or a procedure may have no more than 2 support people in the waiting area - these visitors may rotate.   Children under the age of 32 must have an adult with them who is not the patient. If the patient needs to stay at the hospital during part of their recovery, the visitor guidelines for inpatient rooms apply. Pre-op nurse will coordinate an appropriate time for 1 support person to accompany patient in pre-op.  This support person may not rotate.    Please refer to the [facility] website for the visitor guidelines for Inpatients (after your surgery is over and you are in a regular room).       Your procedure is scheduled on: 08-30-22   Report to [facility] Main Entrance    Report to admitting at      0920  AM   Call this number if you have problems the morning of surgery [phone]   Do not eat food :After Midnight.   After Midnight you may have the following liquids until __0850____ AM DAY OF SURGERY   then nothing by mouth  Water Non-Citrus Juices (without pulp, NO RED) Carbonated Beverages Black Coffee (NO MILK/CREAM OR CREAMERS, sugar ok)  Clear Tea (NO MILK/CREAM OR CREAMERS, sugar ok) regular and decaf                             Plain Jell-O (NO RED)                                           Fruit ices (not with fruit pulp, NO RED)                                     Popsicles (NO RED)                                                               Sports drinks like Gatorade (NO RED)                   The day of surgery:  Drink ONE (1) Pre-Surgery Clear Ensure 0840 AM the morning of surgery. Drink in one sitting. Do not sip.  This drink was given to you during your hospital  pre-op appointment visit. Nothing else to drink after completing the  Pre-Surgery Clear Ensure or G2.          If you have questions, please contact your surgeon's office.   FOLLOW  ANY ADDITIONAL PRE OP INSTRUCTIONS YOU  RECEIVED FROM YOUR SURGEON'S OFFICE!!!     Oral Hygiene is also important to reduce your risk of infection.                                    Remember - BRUSH YOUR TEETH THE MORNING OF SURGERY WITH YOUR REGULAR TOOTHPASTE  DENTURES WILL BE REMOVED PRIOR TO SURGERY PLEASE DO NOT APPLY "Poly grip" OR ADHESIVES!!!  Do NOT smoke after Midnight  These are anesthesia recommendations for holding your anticoagulants.  Please contact your prescribing physician to confirm IF it is safe to hold your anticoagulants for this length of time.   Eliquis Apixaban   72 hours   Xarelto Rivaroxaban   72 hours  Plavix Clopidogrel   120 hours  Pletal Cilostazol   120 hours    Take these medicines the morning of surgery with A SIP OF WATER: loratadine, levothyroxine, diltiazem  DO NOT TAKE ANY ORAL DIABETIC MEDICATIONS DAY OF YOUR SURGERY  Bring CPAP mask and tubing day of surgery.                              You may not have any metal on your body including hair pins, jewelry, and body piercing             Do not wear make-up, lotions, powders, perfumes/cologne, or deodorant  Do not wear nail polish including gel and S&S, artificial/acrylic nails, or any other type of covering on natural nails including finger and toenails. If you have artificial nails, gel coating, etc. that needs to be removed by a nail salon please have this removed prior to surgery or surgery may need to be canceled/ delayed if the surgeon/ anesthesia feels like they are unable to be safely monitored.   Do not shave  48 hours prior to surgery.                 Do not bring valuables to the hospital. [facility] IS NOT             RESPONSIBLE   FOR VALUABLES.   Contacts, glasses, dentures or bridgework may not be worn into surgery.   Bring small overnight bag day of surgery.   DO NOT BRING YOUR HOME MEDICATIONS TO THE HOSPITAL. PHARMACY WILL DISPENSE MEDICATIONS LISTED ON YOUR MEDICATION LIST TO YOU DURING YOUR ADMISSION IN  THE HOSPITAL!    Patients discharged on the day of surgery will not be allowed to drive home.  Someone NEEDS to stay with you for the first 24 hours after anesthesia.   Special Instructions: Bring a copy of your healthcare power of attorney and living will documents the day of surgery if you haven't scanned them before.              Please read over the following fact sheets you were given: IF YOU HAVE QUESTIONS ABOUT YOUR PRE-OP INSTRUCTIONS PLEASE CALL [phone]   If you received a COVID test during your pre-op visit  it is requested that you wear a mask when out in public, stay away from anyone that may not be feeling well and notify your surgeon if you develop symptoms. If you test positive for Covid or have been in contact with anyone that has tested positive in the last 10 days please notify you surgeon.    [facility] - Preparing for Surgery Before surgery, you can play an important role.  Because skin is not sterile, your skin needs to be as free of germs as possible.  You can reduce the number of germs on your skin by washing with CHG (chlorahexidine gluconate) soap before surgery.  CHG is an antiseptic cleaner which kills germs and bonds with the skin to continue killing germs even after washing. Please DO NOT use if you have an allergy to CHG or antibacterial soaps.  If your skin  becomes reddened/irritated stop using the CHG and inform your nurse when you arrive at Short Stay. Do not shave (including legs and underarms) for at least 48 hours prior to the first CHG shower.  You may shave your face/neck. Please follow these instructions carefully:  1.  Shower with CHG Soap the night before surgery and the  morning of Surgery.  2.  If you choose to wash your hair, wash your hair first as usual with your  normal  shampoo.  3.  After you shampoo, rinse your hair and body thoroughly to remove the  shampoo.                           4.  Use CHG as you would any other liquid soap.  You can  apply chg directly  to the skin and wash                       Gently with a scrungie or clean washcloth.  5.  Apply the CHG Soap to your body ONLY FROM THE NECK DOWN.   Do not use on face/ open                           Wound or open sores. Avoid contact with eyes, ears mouth and genitals (private parts).                       Wash face,  Genitals (private parts) with your normal soap.             6.  Wash thoroughly, paying special attention to the area where your surgery  will be performed.  7.  Thoroughly rinse your body with warm water from the neck down.  8.  DO NOT shower/wash with your normal soap after using and rinsing off  the CHG Soap.                9.  Pat yourself dry with a clean towel.            10.  Wear clean pajamas.            11.  Place clean sheets on your bed the night of your first shower and do not  sleep with pets. Day of Surgery : Do not apply any lotions/deodorants the morning of surgery.  Please wear clean clothes to the hospital/surgery center.  FAILURE TO FOLLOW THESE INSTRUCTIONS MAY RESULT IN THE CANCELLATION OF YOUR SURGERY PATIENT SIGNATURE_________________________________  NURSE SIGNATURE__________________________________  ________________________________________________________________________  Rogelia MireIncentive Spirometer  An incentive spirometer is a tool that can help keep your lungs clear and active. This tool measures how well you are filling your lungs with each breath. Taking long deep breaths may help reverse or decrease the chance of developing breathing (pulmonary) problems (especially infection) following: A long period of time when you are unable to move or be active. BEFORE THE PROCEDURE  If the spirometer includes an indicator to show your best effort, your nurse or respiratory therapist will set it to a desired goal. If possible, sit up straight or lean slightly forward. Try not to slouch. Hold the incentive spirometer in an upright  position. INSTRUCTIONS FOR USE  Sit on the edge of your bed if possible, or sit up as far as you can in bed or on a chair. Hold the incentive spirometer in an upright  position. Breathe out normally. Place the mouthpiece in your mouth and seal your lips tightly around it. Breathe in slowly and as deeply as possible, raising the piston or the ball toward the top of the column. Hold your breath for 3-5 seconds or for as long as possible. Allow the piston or ball to fall to the bottom of the column. Remove the mouthpiece from your mouth and breathe out normally. Rest for a few seconds and repeat Steps 1 through 7 at least 10 times every 1-2 hours when you are awake. Take your time and take a few normal breaths between deep breaths. The spirometer may include an indicator to show your best effort. Use the indicator as a goal to work toward during each repetition. After each set of 10 deep breaths, practice coughing to be sure your lungs are clear. If you have an incision (the cut made at the time of surgery), support your incision when coughing by placing a pillow or rolled up towels firmly against it. Once you are able to get out of bed, walk around indoors and cough well. You may stop using the incentive spirometer when instructed by your caregiver.  RISKS AND COMPLICATIONS Take your time so you do not get dizzy or light-headed. If you are in pain, you may need to take or ask for pain medication before doing incentive spirometry. It is harder to take a deep breath if you are having pain. AFTER USE Rest and breathe slowly and easily. It can be helpful to keep track of a log of your progress. Your caregiver can provide you with a simple table to help with this. If you are using the spirometer at home, follow these instructions: SEEK MEDICAL CARE IF:  You are having difficultly using the spirometer. You have trouble using the spirometer as often as instructed. Your pain medication is not giving  enough relief while using the spirometer. You develop fever of 100.5 F (38.1 C) or higher. SEEK IMMEDIATE MEDICAL CARE IF:  You cough up bloody sputum that had not been present before. You develop fever of 102 F (38.9 C) or greater. You develop worsening pain at or near the incision site. MAKE SURE YOU:  Understand these instructions. Will watch your condition. Will get help right away if you are not doing well or get worse. Document Released: 01/08/2007 Document Revised: 11/20/2011 Document Reviewed: 03/11/2007 Exit[employer] Patient Information 2014 [employer], [place].   ________________________________________________________________________  WHAT IS A BLOOD TRANSFUSION? Blood Transfusion Information  A transfusion is the replacement of blood or some of its parts. Blood is made up of multiple cells which provide different functions. Red blood cells carry oxygen and are used for blood loss replacement. White blood cells fight against infection. Platelets control bleeding. Plasma helps clot blood. Other blood products are available for specialized needs, such as hemophilia or other clotting disorders. BEFORE THE TRANSFUSION  Who gives blood for transfusions?  Healthy volunteers who are fully evaluated to make sure their blood is safe. This is blood bank blood. Transfusion therapy is the safest it has ever been in the practice of medicine. Before blood is taken from a donor, a complete history is taken to make sure that person has no history of diseases nor engages in risky social behavior (examples are intravenous drug use or sexual activity with multiple partners). The donor's travel history is screened to minimize risk of transmitting infections, such as malaria. The donated blood is tested for signs of infectious diseases, such as HIV  and hepatitis. The blood is then tested to be sure it is compatible with you in order to minimize the chance of a transfusion reaction. If you or a relative  donates blood, this is often done in anticipation of surgery and is not appropriate for emergency situations. It takes many days to process the donated blood. RISKS AND COMPLICATIONS Although transfusion therapy is very safe and saves many lives, the main dangers of transfusion include:  Getting an infectious disease. Developing a transfusion reaction. This is an allergic reaction to something in the blood you were given. Every precaution is taken to prevent this. The decision to have a blood transfusion has been considered carefully by your caregiver before blood is given. Blood is not given unless the benefits outweigh the risks. AFTER THE TRANSFUSION Right after receiving a blood transfusion, you will usually feel much better and more energetic. This is especially true if your red blood cells have gotten low (anemic). The transfusion raises the level of the red blood cells which carry oxygen, and this usually causes an energy increase. The nurse administering the transfusion will monitor you carefully for complications. HOME CARE INSTRUCTIONS  No special instructions are needed after a transfusion. You may find your energy is better. Speak with your caregiver about any limitations on activity for underlying diseases you may have. SEEK MEDICAL CARE IF:  Your condition is not improving after your transfusion. You develop redness or irritation at the intravenous (IV) site. SEEK IMMEDIATE MEDICAL CARE IF:  Any of the following symptoms occur over the next 12 hours: Shaking chills. You have a temperature by mouth above 102 F (38.9 C), not controlled by medicine. Chest, back, or muscle pain. People around you feel you are not acting correctly or are confused. Shortness of breath or difficulty breathing. Dizziness and fainting. You get a rash or develop hives. You have a decrease in urine output. Your urine turns a dark color or changes to pink, red, or brown. Any of the following symptoms  occur over the next 10 days: You have a temperature by mouth above 102 F (38.9 C), not controlled by medicine. Shortness of breath. Weakness after normal activity. The white part of the eye turns yellow (jaundice). You have a decrease in the amount of urine or are urinating less often. Your urine turns a dark color or changes to pink, red, or brown. Document Released: 08/25/2000 Document Revised: 11/20/2011 Document Reviewed: 04/13/2008 [facility] Patient Information 2014 [place], [place].  _______________________________________________________________________

## 2022-08-14 NOTE — Progress Notes (Addendum)
PCP - Mila Palmer, MD  clearance on chart     [phone] Cardiologist - Sherryl Manges LOV 07-12-22 epic clearance / Garnette Gunner, MD clearance 05-24-22 epic Clearance Dr. Alfonse Spruce, MD nephrology 06-28-22 on chart PPM/ICD- PACEMAKER Device Orders - on chart Rep Notified - no  Chest x-ray -  EKG - 07-12-22 epic Stress Test -  ECHO - 04-17-22 epic Cardiac Cath -  Last device check 07-12-22 epic Sleep Study -  CPAP -   Fasting Blood Sugar -  Checks Blood Sugar _____ times a day  Blood Thinner Instructions:Elequis Managed by Dr. Graciela Husbands Stop 3 days prior to surgery per surgeons office  Last dose 08-26-22 Aspirin Instructions:  ERAS Protcol - PRE-SURGERY Ensure    COVID vaccine -x2  Activity--Able to complete ADL'S without SOB Anesthesia review: Pacemaker, CABG, CHF, CAD , A-fib, Heart block, mild aortic stenosis, OSA recent dx no cpap yet  Patient denies shortness of breath, fever, cough and chest pain at PAT appointment   All instructions explained to the patient, with a verbal understanding of the material. Patient agrees to go over the instructions while at home for a better understanding. Patient also instructed to self quarantine after being tested for COVID-19. The opportunity to ask questions was provided.

## 2022-08-15 NOTE — H&P (Signed)
TOTAL HIP ADMISSION H&P  Patient is admitted for right total hip arthroplasty.  Subjective:  Chief Complaint: Right hip pain  HPI: Samantha Short, 76 y.o. female, has a history of pain and functional disability in the right hip due to arthritis and patient has failed non-surgical conservative treatments for greater than 12 weeks to include NSAID's and/or analgesics and activity modification. Onset of symptoms was gradual, starting  several  years ago with gradually worsening course since that time. The patient noted no past surgery on the right hip. Patient currently rates pain in the right hip at 8 out of 10 with activity. Patient has night pain, worsening of pain with activity and weight bearing, pain that interfers with activities of daily living, and pain with passive range of motion. Patient has evidence of  bone-on-bone arthritis in the right hip, all throughout the hip with subchondral cysts and marginal osteophytes  by imaging studies. This condition presents safety issues increasing the risk of falls. There is no current active infection.  Patient Active Problem List   Diagnosis Date Noted   Pacemaker - MDT 06/21/2020   Myalgia 03/25/2020   Mobitz type 2 second degree heart block 03/18/2020   AVD (aortic valve disease)    Hypothyroidism 01/23/2020   Weight gain 02/08/2018   Chronic diastolic CHF (congestive heart failure) ([place]) 07/30/2017   Chest pain, mid sternal 07/05/2012   Acid reflux    Coronary artery disease    Hyperlipidemia    Hypertension    Heart murmur     Past Medical History:  Diagnosis Date   Asthma    Chronic diastolic CHF (congestive heart failure) ([place])    a. 02/2011 Echo: EF 55-60%, Gr2 DD, Mild MR   Coronary artery disease    a. s/p CABG x 1 2010:  LIMA->LAD.;  b. amdx for CP => LHC 07/04/12: LAD 70-80%, mid RCA 30%, LIMA-LAD patent with competitive flow limiting distal LAD filling, EF 70% with hyperdynamic LV function. Medical therapy continued.    Dyslipidemia    GERD (gastroesophageal reflux disease)    Hyperlipidemia    Hypertension    Hyperthyroidism    Mitral regurgitation    a. mild by echo 02/2011.    Past Surgical History:  Procedure Laterality Date   ABDOMINAL HYSTERECTOMY  1980   CARDIAC CATHETERIZATION  07/27/09 & 07/28/09   CESAREAN SECTION     CORONARY ARTERY BYPASS GRAFT  08/03/2009   x1 using left internal mammary artery to distal left anterior  descending coronary artery.    GALLBLADDER SURGERY  2001   LEFT HEART CATHETERIZATION WITH CORONARY/GRAFT ANGIOGRAM N/A 07/05/2012   Procedure: LEFT HEART CATHETERIZATION WITH Beatrix Fetters;  Surgeon: Josue Hector, MD;  Location: [facility] CATH LAB;  Service: Cardiovascular;  Laterality: N/A;   PACEMAKER IMPLANT N/A 03/17/2020   Procedure: PACEMAKER IMPLANT;  Surgeon: Deboraha Sprang, MD;  Location: [place] CV LAB;  Service: Cardiovascular;  Laterality: N/A;   RIGHT/LEFT HEART CATH AND CORONARY/GRAFT ANGIOGRAPHY N/A 03/17/2020   Procedure: RIGHT/LEFT HEART CATH AND CORONARY/GRAFT ANGIOGRAPHY;  Surgeon: Wellington Hampshire, MD;  Location: [place] CV LAB;  Service: Cardiovascular;  Laterality: N/A;   SHOULDER SURGERY  1998 / 2001   from accident   [place]    Prior to Admission medications   Medication Sig Start Date End Date Taking? Authorizing Provider  acetaminophen (TYLENOL) 500 MG tablet Take 500 mg by mouth every 6 (six) hours as needed for moderate  pain.   Yes [provider]  apixaban (ELIQUIS) 5 MG TABS tablet Take 1 tablet (5 mg total) by mouth 2 (two) times daily. 07/12/22  Yes Duke Salvia, MD  cholecalciferol (VITAMIN D3) 25 MCG (1000 UNIT) tablet Take 1,000 Units by mouth daily.   Yes [provider]  cyanocobalamin (,VITAMIN B-12,) 1000 MCG/ML injection Inject 1,000 mcg into the muscle every 30 (thirty) days.   Yes [provider]  diclofenac Sodium (VOLTAREN) 1 % GEL Apply 1 Application  topically 4 (four) times daily as needed (pain).   Yes [provider]  diltiazem (CARDIZEM CD) 120 MG 24 hr capsule Take 1 capsule (120 mg total) by mouth daily. 07/12/22  Yes Duke Salvia, MD  fluticasone [facility]) 50 MCG/ACT nasal spray Place 2 sprays into both nostrils daily as needed for allergies or rhinitis.   Yes [provider]  furosemide (LASIX) 20 MG tablet Take 1 tablet by mouth daily. 04/12/20  Yes [provider]  isosorbide mononitrate (IMDUR) 30 MG 24 hr tablet TAKE 1 TABLET AT BEDTIME. PLEASE KEEP UPCOMING APPOINTMENT FOR FUTURE REFILLS. 04/24/22  Yes Kathleene Hazel, MD  levothyroxine (SYNTHROID) 50 MCG tablet Take 50 mcg by mouth every morning. 04/19/20  Yes [provider]  loratadine (CLARITIN) 10 MG tablet Take 10 mg by mouth daily.   Yes [provider]  Multiple Vitamin (MULTIVITAMIN WITH MINERALS) TABS tablet Take 1 tablet by mouth daily.   Yes [provider]  Multiple Vitamins-Minerals (PRESERVISION AREDS 2) CAPS Take 1 capsule by mouth 2 (two) times daily.   Yes [provider]  nitroGLYCERIN (NITROSTAT) 0.4 MG SL tablet PLACE 1 TABLET UNDER THE TONGUE EVERY 5 MINUTES AS NEEDED FOR CHEST PAIN 03/07/22  Yes Kathleene Hazel, MD  Omega-3 Fatty Acids (FISH OIL PO) Take 1 capsule by mouth daily.   Yes [provider]  traZODone (DESYREL) 50 MG tablet Take 25 mg by mouth at bedtime. 08/06/22  Yes [provider]    Allergies  Allergen Reactions   Ceclor [Cefaclor] Other (See Comments)    Lost vision in eye   Hctz [Hydrochlorothiazide] Other (See Comments)    Renal insufficiency   Lipitor [Atorvastatin Calcium] Other (See Comments)    Increased A1C   Norvasc [Amlodipine] Other (See Comments)    Makes patient stiff   Penicillins Hives, Shortness Of Breath, Itching and Rash    Has patient had a PCN reaction causing immediate rash, facial/tongue/throat swelling, SOB or  lightheadedness with hypotension: Yes Has patient had a PCN reaction causing severe rash involving mucus membranes or skin necrosis: Unk Has patient had a PCN reaction that required hospitalization: No Has patient had a PCN reaction occurring within the last 10 years: No If all of the above answers are "NO", then may proceed with Cephalosporin use.    Sulfa Antibiotics Other (See Comments)    CAUSES SHOCK   Statins Other (See Comments)    MYALGIAS AND WEAKNESS   Milk-Related Compounds Diarrhea and Nausea And Vomiting   Erythromycin Rash   Latex Rash   Levofloxacin Rash   Tetracyclines & Related Rash    Social History   Socioeconomic History   Marital status: Widowed    Spouse name: Not on file   Number of children: 3   Years of education: Not on file   Highest education level: Not on file  Occupational History   Occupation: Retired- Engineer, site  Tobacco Use   Smoking status: Former  Years: 7.00    Types: Cigarettes    Quit date: 07/12/2010    Years since quitting: 12.1   Smokeless tobacco: Never  Vaping Use   Vaping Use: Never used  Substance and Sexual Activity   Alcohol use: No   Drug use: No   Sexual activity: Not on file  Other Topics Concern   Not on file  Social History Narrative   Not on file   Social Determinants of Health   Financial Resource Strain: Not on file  Food Insecurity: Not on file  Transportation Needs: Not on file  Physical Activity: Not on file  Stress: Not on file  Social Connections: Not on file  Intimate Partner Violence: Not on file    Tobacco Use: Medium Risk (07/12/2022)   Patient History    Smoking Tobacco Use: Former    Smokeless Tobacco Use: Never    Passive Exposure: Not on file   Social History   Substance and Sexual Activity  Alcohol Use No    Family History  Problem Relation Age of Onset   Cancer Father    Heart attack Father    High blood pressure Father    High Cholesterol Father    Heart disease Father     Alcoholism Father    Obesity Father    Heart attack Mother    Heart disease Mother        had pacemaker   High blood pressure Mother    High Cholesterol Mother    Obesity Mother    Cancer Brother    Heart disease Brother    Heart disease Sister    Cancer Sister    Angina Sister    Coronary artery disease Son    Hypertension Sister    Hypertension Brother    Thyroid disease Neg Hx     Review of Systems  Constitutional:  Negative for chills and fever.  HENT:  Negative for congestion, sore throat and tinnitus.   Eyes:  Negative for double vision, photophobia and pain.  Respiratory:  Negative for cough, shortness of breath and wheezing.   Cardiovascular:  Negative for chest pain, palpitations and orthopnea.  Gastrointestinal:  Negative for heartburn, nausea and vomiting.  Genitourinary:  Negative for dysuria, frequency and urgency.  Musculoskeletal:  Positive for joint pain.  Neurological:  Negative for dizziness, weakness and headaches.     Objective:  Physical Exam: Well nourished and well developed.  General: Alert and oriented x3, cooperative and pleasant, no acute distress.  Head: normocephalic, atraumatic, neck supple.  Eyes: EOMI.  Musculoskeletal:  Right Hip Exam:  The range of motion: Flexion to 100 degrees, Internal Rotation to 0 degrees, External Rotation to 20-degree external rotation contracture, and abduction to 10 degrees beyond that.  There is no tenderness over the greater trochanteric bursa.  Calves soft and nontender. Motor function intact in LE. Strength 5/5 LE bilaterally. Neuro: Distal pulses 2+. Sensation to light touch intact in LE.  Imaging Review Plain radiographs demonstrate severe degenerative joint disease of the right hip. The bone quality appears to be adequate for age and reported activity level.  Assessment/Plan:  End stage arthritis, right hip  The patient history, physical examination, clinical judgement of the provider and  imaging studies are consistent with end stage degenerative joint disease of the right hip and total hip arthroplasty is deemed medically necessary. The treatment options including medical management, injection therapy, arthroscopy and arthroplasty were discussed at length. The risks and benefits of total hip arthroplasty  were presented and reviewed. The risks due to aseptic loosening, infection, stiffness, dislocation/subluxation, thromboembolic complications and other imponderables were discussed. The patient acknowledged the explanation, agreed to proceed with the plan and consent was signed. Patient is being admitted for inpatient treatment for surgery, pain control, PT, OT, prophylactic antibiotics, VTE prophylaxis, progressive ambulation and ADLs and discharge planning.The patient is planning to be discharged  home .   Patient's anticipated LOS is less than 2 midnights, meeting these requirements: - Younger than 109 - Lives within 1 hour of care - Has a competent adult at home to recover with post-op recover - NO history of  - Chronic pain requiring opioids  - Diabetes  - Heart attack  - Stroke  - DVT/VTE  - Cardiac arrhythmia  - Respiratory Failure/COPD  - Anemia  - Advanced Liver disease  Therapy Plans: HEP Disposition: Home with daughter Planned DVT Prophylaxis: Eliquis 5 mg BID DME Needed: Gilford Rile PCP: Jonathon Jordan, MD (recommended cardiac and nephro clearance) Nephrologist: Jannifer Hick, MD (clearance received) Cardiologist: Lauree Chandler, MD (clearance received) Electrophysiologist: Adam Phenix, MD  TXA: IV Allergies: LATEX, PCN (SOB), sulfa, tetracyclines Anesthesia Concerns: None BMI: 37.8 Last HgbA1c: Not diabetic [facility]  Other: - Pacemaker, CKD III, CHF, CAD - Has 76 y/o autistic grandson who lives at home, becomes aggressive when triggered. May want to stay an additional night.   - Patient was instructed on what medications to  stop prior to surgery. - Follow-up visit in 2 weeks with Dr. Wynelle Link - Begin physical therapy following surgery - Pre-operative lab work as pre-surgical testing - Prescriptions will be provided in hospital at time of discharge  Theresa Duty, PA-C Orthopedic Surgery [facility] [place]

## 2022-08-16 ENCOUNTER — Ambulatory Visit (HOSPITAL_BASED_OUTPATIENT_CLINIC_OR_DEPARTMENT_OTHER): Payer: Medicare HMO | Attending: Internal Medicine | Admitting: Cardiology

## 2022-08-16 VITALS — Ht 63.0 in | Wt 204.0 lb

## 2022-08-16 DIAGNOSIS — G4733 Obstructive sleep apnea (adult) (pediatric): Secondary | ICD-10-CM | POA: Diagnosis not present

## 2022-08-16 DIAGNOSIS — I480 Paroxysmal atrial fibrillation: Secondary | ICD-10-CM | POA: Insufficient documentation

## 2022-08-17 ENCOUNTER — Encounter (HOSPITAL_COMMUNITY): Payer: Self-pay

## 2022-08-17 ENCOUNTER — Other Ambulatory Visit: Payer: Self-pay

## 2022-08-17 ENCOUNTER — Encounter (HOSPITAL_COMMUNITY)
Admission: RE | Admit: 2022-08-17 | Discharge: 2022-08-17 | Disposition: A | Discharge status: Home / Self Care | Payer: Medicare HMO | Source: Ambulatory Visit | Attending: Orthopedic Surgery | Admitting: Orthopedic Surgery

## 2022-08-17 VITALS — BP 149/79 | HR 87 | Temp 97.6°F | Resp 16 | Ht 63.0 in

## 2022-08-17 DIAGNOSIS — I10 Essential (primary) hypertension: Secondary | ICD-10-CM | POA: Diagnosis not present

## 2022-08-17 DIAGNOSIS — Z01818 Encounter for other preprocedural examination: Secondary | ICD-10-CM

## 2022-08-17 DIAGNOSIS — Z01812 Encounter for preprocedural laboratory examination: Secondary | ICD-10-CM | POA: Diagnosis not present

## 2022-08-17 HISTORY — DX: Other specified postprocedural states: R11.2

## 2022-08-17 HISTORY — DX: Hypothyroidism, unspecified: E03.9

## 2022-08-17 HISTORY — DX: Other complications of anesthesia, initial encounter: T88.59XA

## 2022-08-17 HISTORY — DX: Personal history of other diseases of the digestive system: Z87.19

## 2022-08-17 HISTORY — DX: Unspecified osteoarthritis, unspecified site: M19.90

## 2022-08-17 HISTORY — DX: Other specified postprocedural states: Z98.890

## 2022-08-17 HISTORY — DX: Presence of cardiac pacemaker: Z95.0

## 2022-08-17 HISTORY — DX: Headache, unspecified: R51.9

## 2022-08-17 HISTORY — DX: Cardiac arrhythmia, unspecified: I49.9

## 2022-08-17 HISTORY — DX: Nonrheumatic aortic (valve) stenosis: I35.0

## 2022-08-17 HISTORY — DX: Anxiety disorder, unspecified: F41.9

## 2022-08-17 HISTORY — DX: Depression, unspecified: F32.A

## 2022-08-17 HISTORY — DX: Anemia, unspecified: D64.9

## 2022-08-17 HISTORY — DX: Personal history of urinary calculi: Z87.442

## 2022-08-17 LAB — CBC
HCT: 43.9 % (ref 36.0–46.0)
Hemoglobin: 14.1 g/dL (ref 12.0–15.0)
MCH: 30.3 pg (ref 26.0–34.0)
MCHC: 32.1 g/dL (ref 30.0–36.0)
MCV: 94.4 fL (ref 80.0–100.0)
Platelets: 242 10*3/uL (ref 150–400)
RBC: 4.65 MIL/uL (ref 3.87–5.11)
RDW: 13.6 % (ref 11.5–15.5)
WBC: 8.9 10*3/uL (ref 4.0–10.5)
nRBC: 0 % (ref 0.0–0.2)

## 2022-08-17 LAB — BASIC METABOLIC PANEL
Anion gap: 8 (ref 5–15)
BUN: 24 mg/dL — ABNORMAL HIGH (ref 8–23)
CO2: 26 mmol/L (ref 22–32)
Calcium: 10 mg/dL (ref 8.9–10.3)
Chloride: 106 mmol/L (ref 98–111)
Creatinine, Ser: 1.07 mg/dL — ABNORMAL HIGH (ref 0.44–1.00)
GFR, Estimated: 54 mL/min — ABNORMAL LOW (ref >60)
Glucose, Bld: 105 mg/dL — ABNORMAL HIGH (ref 70–99)
Potassium: 5 mmol/L (ref 3.5–5.1)
Sodium: 140 mmol/L (ref 135–145)

## 2022-08-17 LAB — TYPE AND SCREEN
ABO/RH(D): A POS
Antibody Screen: NEGATIVE

## 2022-08-17 LAB — SURGICAL PCR SCREEN
MRSA, PCR: NEGATIVE
Staphylococcus aureus: NEGATIVE

## 2022-08-19 NOTE — Procedures (Signed)
   Patient Name: Samantha Short, Samantha Short Date: 08/16/2022 Gender: Female D.O.B: 03-Jun-1946 Age (years): 76 Referring Provider: Sherryl Manges Height (inches): 62 Interpreting Physician: Armanda Magic MD, ABSM Weight (lbs): 204 RPSGT: [place] BMI: 37 MRN: [id number] Neck Size: 14.75  CLINICAL INFORMATION Sleep Study Type: HST  Indication for sleep study: N/A  Epworth Sleepiness Score: N/A  SLEEP STUDY TECHNIQUE A multi-channel overnight portable sleep study was performed. The channels recorded were: nasal airflow, thoracic respiratory movement, and oxygen saturation with a pulse oximetry. Snoring was also monitored.  MEDICATIONS Patient self administered medications include: N/A.  SLEEP ARCHITECTURE Patient was studied for 558.2 minutes. The sleep efficiency was 100.0 % and the patient was supine for 0%. The arousal index was 0.0 per hour.  RESPIRATORY PARAMETERS The overall AHI was 16.2 per hour, with a central apnea index of 0 per hour.  The oxygen nadir was 81% during sleep.  CARDIAC DATA Mean heart rate during sleep was 69.3 bpm.  IMPRESSIONS - Moderate obstructive sleep apnea occurred during this study (AHI = 16.2/h). - Moderate oxygen desaturation was noted during this study (Min O2 = 81%). - Patient snored 11.9% during the sleep.  DIAGNOSIS - Obstructive Sleep Apnea (G47.33) - Nocturnal Hypoxemia  RECOMMENDATIONS - Recommend in lab CPAP titration.  - Avoid alcohol, sedatives and other CNS depressants that may worsen sleep apnea and disrupt normal sleep architecture. - Sleep hygiene should be reviewed to assess factors that may improve sleep quality. - Weight management and regular exercise should be initiated or continued.  [Electronically signed] 08/19/2022 12:57 PM  Armanda Magic MD, ABSM Diplomate, American Board of Sleep Medicine

## 2022-08-21 ENCOUNTER — Encounter (HOSPITAL_COMMUNITY): Payer: Self-pay | Admitting: Orthopedic Surgery

## 2022-08-21 NOTE — Progress Notes (Signed)
Anesthesia Chart Review   Case: [id number] Date/Time: 08/30/22 1135   Procedure: TOTAL HIP ARTHROPLASTY ANTERIOR APPROACH (Right: Hip)   Anesthesia type: Choice   Pre-op diagnosis: right hip osteoarthritis   Location: [facility] ROOM 10 / [facility]   Surgeons: Ollen Gross, MD       DISCUSSION:76 y.o. former smoker with h/o HTN, moderate aortic stenosis, CAD on Echo 04/2022, CHF, pacemaker in place (device orders in 08/14/2022 progress note), CKD Stage III, right hip OA scheduled for above procedure 08/30/2022 with Dr. Ollen Gross.   Pt last seen by cardiology 07/12/2022. Per OV note, "Fatigue in context of her psychosocial stress is progressively problematic patient and his situation it is noteworthy that she has been caring for siblings with cancer and has in her home her grandson with autism.  Moreover, she has longstanding sleep disordered breathing and likely has sleep apnea contributing.  She acknowledges depression in the context of her stresses even though she is very "tough "  have recommended a sleep study   Functional capacity is limited but is able to walk in the supermarket albeit with a cart.  No chest pain.  She saw Dr. Esmond Camper about 3 months ago's assessment that things were going pretty well.   Symptomatic PVCs.  While they are listed on her device as 6/h, during our visit they are much more frequent than that.  We will discontinue her lisinopril and begin her on diltiazem.   Her device also has identified atrial fibrillation of 18+ hours duration.  The Assert 2 data set suggest that the risks of thromboembolism are significantly higher at 24 hours and 18 hours is quite close.  Moreover, data sets which have looked at CHADS-VASc score have suggested that values over 4 or 5 almost make the duration of the atrial fibrillation irrelevant.  With her CHADS-VASc or of greater than or equal to 5 we have reviewed risks and benefits, we will discontinue her aspirin we will begin her on Eliquis.   I  think her procedural risk for her hip replacement however is acceptable not withstanding these adjustments."  Pt reports last dose of Eliquis 08/26/2022.   Anticipate pt can proceed with planned procedure barring acute status change.   VS: BP (!) 149/79   Pulse 87   Temp 36.4 C (Oral)   Resp 16   Ht 5\' 3"  (1.6 m)   SpO2 98%   BMI 36.14 kg/m   PROVIDERS: , MD is PCP   Mila Palmer, MD - Cardiology  LABS: Labs reviewed: Acceptable for surgery. (all labs ordered are listed, but only abnormal results are displayed)  Labs Reviewed  BASIC METABOLIC PANEL - Abnormal; Notable for the following components:      Result Value   Glucose, Bld 105 (*)    BUN 24 (*)    Creatinine, Ser 1.07 (*)    GFR, Estimated 54 (*)    All other components within normal limits  SURGICAL PCR SCREEN  CBC  TYPE AND SCREEN     IMAGES:   EKG:   CV: Echo 04/17/2022 1. The aortic valve is tricuspid. There is moderate calcification of the  aortic valve. There is moderate thickening of the aortic valve. Aortic  valve regurgitation is not visualized. Moderate aortic valve stenosis.  Aortic valve area, by VTI measures  1.15 cm. Aortic valve mean gradient measures 23.0 mmHg. Aortic valve Vmax  measures 3.16 m/s.   2. Left ventricular ejection fraction, by estimation, is 65 to  70%. The  left ventricle has normal function. The left ventricle has no regional  wall motion abnormalities. Left ventricular diastolic parameters are  consistent with Grade I diastolic  dysfunction (impaired relaxation).   3. Right ventricular systolic function is normal. The right ventricular  size is normal. There is normal pulmonary artery systolic pressure. The  estimated right ventricular systolic pressure is 26.4 mmHg.   4. The mitral valve is grossly normal. Trivial mitral valve  regurgitation. No evidence of mitral stenosis.   5. The inferior vena cava is normal in size with greater than 50%   respiratory variability, suggesting right atrial pressure of 3 mmHg.   Cardiac Cath 03/17/2020 LIMA and is small. The graft exhibits no disease. There is competitive flow. Prox LAD to Mid LAD lesion is 20% stenosed.   1.  Mild nonobstructive coronary artery disease.  LIMA to LAD is patent.  However, the native LAD has normal antegrade flow and thus there is competitive flow for the LIMA flow. 2.  Left ventricular angiography was not performed due to chronic kidney disease. 3.  Right heart catheterization showed moderately elevated filling pressures, moderate pulmonary hypertension and normal cardiac output.  Prominent V waves on pulmonary capillary wedge pressure (39 mmHg) suggestive of significant mitral regurgitation. 4.  Minimal aortic valve gradient with peak to peak systolic gradient less than 10 mmHg. Past Medical History:  Diagnosis Date   Anemia    pernicious   Anxiety    Arthritis    Asthma    Chronic diastolic CHF (congestive heart failure) ([facility])    a. 02/2011 Echo: EF 55-60%, Gr2 DD, Mild MR   Chronic kidney disease    stage 3   Complication of anesthesia    Coronary artery disease    a. s/p CABG x 1 2010:  LIMA->LAD.;  b. amdx for CP => LHC 07/04/12: LAD 70-80%, mid RCA 30%, LIMA-LAD patent with competitive flow limiting distal LAD filling, EF 70% with hyperdynamic LV function. Medical therapy continued.   Depression    Dyslipidemia    Dysrhythmia    hx of Afib   GERD (gastroesophageal reflux disease)    Headache    hx of migraines   Heart murmur    History of hiatal hernia    History of kidney stones    Hyperlipidemia    Hypertension    Hyperthyroidism    Hypothyroidism    Mild aortic stenosis    Mitral regurgitation    a. mild by echo 02/2011.   Pneumonia    PONV (postoperative nausea and vomiting)    Presence of permanent cardiac pacemaker     Past Surgical History:  Procedure Laterality Date   ABDOMINAL HYSTERECTOMY  1980   CARDIAC CATHETERIZATION   07/27/09 & 07/28/09   CESAREAN SECTION     CORONARY ARTERY BYPASS GRAFT  08/03/2009   x1 using left internal mammary artery to distal left anterior  descending coronary artery.    GALLBLADDER SURGERY  2001   LEFT HEART CATHETERIZATION WITH CORONARY/GRAFT ANGIOGRAM N/A 07/05/2012   Procedure: LEFT HEART CATHETERIZATION WITH Isabel Caprice;  Surgeon: Wendall Stade, MD;  Location[facility] CATH LAB;  Service: Cardiovascular;  Laterality: N/A;   PACEMAKER IMPLANT N/A 03/17/2020   Procedure: PACEMAKER IMPLANT;  Surgeon: Duke Salvia, MD;  Location: [facility] INVASIVE CV LAB;  Service: Cardiovascular;  Laterality: N/A;   RIGHT/LEFT HEART CATH AND CORONARY/GRAFT ANGIOGRAPHY N/A 03/17/2020   Procedure: RIGHT/LEFT HEART CATH AND CORONARY/GRAFT ANGIOGRAPHY;  Surgeon: Iran Ouch,  MD;  Location: [facility] INVASIVE CV LAB;  Service: Cardiovascular;  Laterality: N/A;   SHOULDER SURGERY  1998 / 2001   from accident   rotator cuff   VESICOVAGINAL FISTULA CLOSURE W/ TAH  1998    MEDICATIONS:  acetaminophen (TYLENOL) 500 MG tablet   apixaban (ELIQUIS) 5 MG TABS tablet   cholecalciferol (VITAMIN D3) 25 MCG (1000 UNIT) tablet   cyanocobalamin (,VITAMIN B-12,) 1000 MCG/ML injection   diclofenac Sodium (VOLTAREN) 1 % GEL   diltiazem (CARDIZEM CD) 120 MG 24 hr capsule   fluticasone (FLONASE) 50 MCG/ACT nasal spray   furosemide (LASIX) 20 MG tablet   isosorbide mononitrate (IMDUR) 30 MG 24 hr tablet   levothyroxine (SYNTHROID) 50 MCG tablet   loratadine (CLARITIN) 10 MG tablet   Multiple Vitamin (MULTIVITAMIN WITH MINERALS) TABS tablet   Multiple Vitamins-Minerals (PRESERVISION AREDS 2) CAPS   nitroGLYCERIN (NITROSTAT) 0.4 MG SL tablet   Omega-3 Fatty Acids (FISH OIL PO)   traZODone (DESYREL) 50 MG tablet   No current facility-administered medications for this encounter.

## 2022-08-21 NOTE — Anesthesia Preprocedure Evaluation (Addendum)
Anesthesia Evaluation  Patient identified by MRN, date of birth, ID band Patient awake    Reviewed: Allergy & Precautions, NPO status , Patient's Chart, lab work & pertinent test results  History of Anesthesia Complications (+) PONV and history of anesthetic complications  Airway Mallampati: II  TM Distance: >3 FB Neck ROM: Full    Dental no notable dental hx. (+) Teeth Intact, Dental Advisory Given   Pulmonary asthma , sleep apnea , former smoker   Pulmonary exam normal breath sounds clear to auscultation       Cardiovascular hypertension, + CAD, + CABG and +CHF  Normal cardiovascular exam+ dysrhythmias Atrial Fibrillation + pacemaker + Valvular Problems/Murmurs (mod AS) AS  Rhythm:Regular Rate:Normal  CV: Echo 04/17/2022 1. The aortic valve is tricuspid. There is moderate calcification of the  aortic valve. There is moderate thickening of the aortic valve. Aortic  valve regurgitation is not visualized. Moderate aortic valve stenosis.  Aortic valve area, by VTI measures  1.15 cm. Aortic valve mean gradient measures 23.0 mmHg. Aortic valve Vmax  measures 3.16 m/s.   2. Left ventricular ejection fraction, by estimation, is 65 to 70%. The  left ventricle has normal function. The left ventricle has no regional  wall motion abnormalities. Left ventricular diastolic parameters are  consistent with Grade I diastolic  dysfunction (impaired relaxation).   3. Right ventricular systolic function is normal. The right ventricular  size is normal. There is normal pulmonary artery systolic pressure. The  estimated right ventricular systolic pressure is 26.4 mmHg.   4. The mitral valve is grossly normal. Trivial mitral valve  regurgitation. No evidence of mitral stenosis.   5. The inferior vena cava is normal in size with greater than 50%  respiratory variability, suggesting right atrial pressure of 3 mmHg.    Cardiac Cath 03/17/2020 LIMA  and is small. The graft exhibits no disease. There is competitive flow. Prox LAD to Mid LAD lesion is 20% stenosed.   1.  Mild nonobstructive coronary artery disease.  LIMA to LAD is patent.  However, the native LAD has normal antegrade flow and thus there is competitive flow for the LIMA flow. 2.  Left ventricular angiography was not performed due to chronic kidney disease. 3.  Right heart catheterization showed moderately elevated filling pressures, moderate pulmonary hypertension and normal cardiac output.  Prominent V waves on pulmonary capillary wedge pressure (39 mmHg) suggestive of significant mitral regurgitation. 4.  Minimal aortic valve gradient with peak to peak systolic gradient less than 10 mmHg.    Neuro/Psych  Headaches PSYCHIATRIC DISORDERS Anxiety Depression       GI/Hepatic Neg liver ROS, hiatal hernia,GERD  ,,  Endo/Other  Hypothyroidism    Renal/GU Renal InsufficiencyRenal diseaseLab Results      Component                Value               Date                      CREATININE               1.07 (H)            08/17/2022                BUN                      24 (H)  08/17/2022                NA                       140                 08/17/2022                K                        5.0                 08/17/2022                CL                       106                 08/17/2022                CO2                      26                  08/17/2022             negative genitourinary   Musculoskeletal  (+) Arthritis ,    Abdominal   Peds  Hematology  (+) Blood dyscrasia (on eliquis, last dose 12/16 9pm)   Anesthesia Other Findings   Reproductive/Obstetrics                             Anesthesia Physical Anesthesia Plan  ASA: 3  Anesthesia Plan: Spinal   Post-op Pain Management: Ofirmev IV (intra-op)*   Induction:   PONV Risk Score and Plan: 3 and Treatment may vary due to age or medical condition,  Propofol infusion, Ondansetron and Dexamethasone  Airway Management Planned: Natural Airway  Additional Equipment:   Intra-op Plan:   Post-operative Plan:   Informed Consent: I have reviewed the patients History and Physical, chart, labs and discussed the procedure including the risks, benefits and alternatives for the proposed anesthesia with the patient or authorized representative who has indicated his/her understanding and acceptance.     Dental advisory given  Plan Discussed with: CRNA  Anesthesia Plan Comments:        Anesthesia Quick Evaluation

## 2022-08-22 ENCOUNTER — Telehealth: Payer: Self-pay | Admitting: Internal Medicine

## 2022-08-22 NOTE — Telephone Encounter (Signed)
Attempted phone call to pt.  Unable to leave voicemail message as voicemail is full. 

## 2022-08-22 NOTE — Telephone Encounter (Signed)
Follow Up:     Patient had sleep study. She said based on her results, ask Dr Graciela Husbands what is the next step please? She says she is scheduled for a hip replacement next week on 08-30-22.

## 2022-08-22 NOTE — Telephone Encounter (Signed)
Pt returned call and advised according to Dr Odessa Fleming last note "I think her procedural risk for her hip replacement however is acceptable." Given addition of Eliquis 5mg  - 1 tablet bid and Diltiazem 120mg  - 1 tablet daily. Pt reports sleep study result is back but she has not been contacted regarding CPAP.  Pt advised once sleep study has been reviewed and sent to sleep department she will be contacted with next steps.  She has made her surgeon aware that sleep study has been completed.  Pt verbalizes understanding and agrees with current plan.

## 2022-08-25 DIAGNOSIS — E538 Deficiency of other specified B group vitamins: Secondary | ICD-10-CM | POA: Diagnosis not present

## 2022-08-30 ENCOUNTER — Observation Stay (HOSPITAL_COMMUNITY): Payer: Medicare HMO

## 2022-08-30 ENCOUNTER — Encounter (HOSPITAL_COMMUNITY)
Admission: RE | Disposition: A | Discharge status: Home / Self Care | Payer: Self-pay | Source: Ambulatory Visit | Attending: Orthopedic Surgery

## 2022-08-30 ENCOUNTER — Observation Stay (HOSPITAL_COMMUNITY)
Admission: RE | Admit: 2022-08-30 | Discharge: 2022-09-01 | Disposition: A | Discharge status: Home / Self Care | Payer: Medicare HMO | Source: Ambulatory Visit | Attending: Orthopedic Surgery | Admitting: Orthopedic Surgery

## 2022-08-30 ENCOUNTER — Ambulatory Visit (HOSPITAL_COMMUNITY): Payer: Medicare HMO | Admitting: Physician Assistant

## 2022-08-30 ENCOUNTER — Ambulatory Visit (HOSPITAL_COMMUNITY): Payer: Medicare HMO

## 2022-08-30 ENCOUNTER — Encounter (HOSPITAL_COMMUNITY): Payer: Self-pay | Admitting: Orthopedic Surgery

## 2022-08-30 ENCOUNTER — Other Ambulatory Visit: Payer: Self-pay

## 2022-08-30 ENCOUNTER — Ambulatory Visit (HOSPITAL_BASED_OUTPATIENT_CLINIC_OR_DEPARTMENT_OTHER): Payer: Medicare HMO | Admitting: Anesthesiology

## 2022-08-30 DIAGNOSIS — I110 Hypertensive heart disease with heart failure: Secondary | ICD-10-CM | POA: Diagnosis not present

## 2022-08-30 DIAGNOSIS — I509 Heart failure, unspecified: Secondary | ICD-10-CM | POA: Diagnosis not present

## 2022-08-30 DIAGNOSIS — I130 Hypertensive heart and chronic kidney disease with heart failure and stage 1 through stage 4 chronic kidney disease, or unspecified chronic kidney disease: Secondary | ICD-10-CM | POA: Insufficient documentation

## 2022-08-30 DIAGNOSIS — N183 Chronic kidney disease, stage 3 unspecified: Secondary | ICD-10-CM | POA: Diagnosis not present

## 2022-08-30 DIAGNOSIS — Z96641 Presence of right artificial hip joint: Secondary | ICD-10-CM | POA: Diagnosis not present

## 2022-08-30 DIAGNOSIS — Z950 Presence of cardiac pacemaker: Secondary | ICD-10-CM | POA: Insufficient documentation

## 2022-08-30 DIAGNOSIS — Z79899 Other long term (current) drug therapy: Secondary | ICD-10-CM | POA: Insufficient documentation

## 2022-08-30 DIAGNOSIS — I4891 Unspecified atrial fibrillation: Secondary | ICD-10-CM | POA: Insufficient documentation

## 2022-08-30 DIAGNOSIS — Z91040 Latex allergy status: Secondary | ICD-10-CM | POA: Diagnosis not present

## 2022-08-30 DIAGNOSIS — Z7901 Long term (current) use of anticoagulants: Secondary | ICD-10-CM | POA: Diagnosis not present

## 2022-08-30 DIAGNOSIS — Z951 Presence of aortocoronary bypass graft: Secondary | ICD-10-CM | POA: Diagnosis not present

## 2022-08-30 DIAGNOSIS — I5032 Chronic diastolic (congestive) heart failure: Secondary | ICD-10-CM | POA: Insufficient documentation

## 2022-08-30 DIAGNOSIS — Z87891 Personal history of nicotine dependence: Secondary | ICD-10-CM

## 2022-08-30 DIAGNOSIS — E039 Hypothyroidism, unspecified: Secondary | ICD-10-CM | POA: Insufficient documentation

## 2022-08-30 DIAGNOSIS — J45909 Unspecified asthma, uncomplicated: Secondary | ICD-10-CM

## 2022-08-30 DIAGNOSIS — M1611 Unilateral primary osteoarthritis, right hip: Secondary | ICD-10-CM | POA: Diagnosis not present

## 2022-08-30 DIAGNOSIS — I2510 Atherosclerotic heart disease of native coronary artery without angina pectoris: Secondary | ICD-10-CM | POA: Insufficient documentation

## 2022-08-30 DIAGNOSIS — Z471 Aftercare following joint replacement surgery: Secondary | ICD-10-CM | POA: Diagnosis not present

## 2022-08-30 DIAGNOSIS — M169 Osteoarthritis of hip, unspecified: Secondary | ICD-10-CM | POA: Diagnosis present

## 2022-08-30 HISTORY — DX: Sleep apnea, unspecified: G47.30

## 2022-08-30 HISTORY — PX: TOTAL HIP ARTHROPLASTY: SHX124

## 2022-08-30 SURGERY — ARTHROPLASTY, HIP, TOTAL, ANTERIOR APPROACH
Anesthesia: Spinal | Site: Hip | Laterality: Right

## 2022-08-30 MED ORDER — TRANEXAMIC ACID-NACL 1000-0.7 MG/100ML-% IV SOLN
1000.0000 mg | INTRAVENOUS | Status: AC
  Administered 2022-08-30: 1000 mg via INTRAVENOUS
  Filled 2022-08-30: qty 100

## 2022-08-30 MED ORDER — DEXAMETHASONE SODIUM PHOSPHATE 10 MG/ML IJ SOLN
8.0000 mg | Freq: Once | INTRAMUSCULAR | Status: AC
  Administered 2022-08-30: 8 mg via INTRAVENOUS

## 2022-08-30 MED ORDER — DIPHENHYDRAMINE HCL 12.5 MG/5ML PO ELIX: 12.5000 mg | ORAL_SOLUTION | ORAL | Status: DC | PRN

## 2022-08-30 MED ORDER — FENTANYL CITRATE (PF) 100 MCG/2ML IJ SOLN
INTRAMUSCULAR | Status: DC | PRN
  Administered 2022-08-30 (×3): 25 ug via INTRAVENOUS

## 2022-08-30 MED ORDER — ONDANSETRON HCL 4 MG/2ML IJ SOLN
INTRAMUSCULAR | Status: DC | PRN
  Administered 2022-08-30: 4 mg via INTRAVENOUS

## 2022-08-30 MED ORDER — ACETAMINOPHEN 325 MG PO TABS: 325.0000 mg | ORAL_TABLET | Freq: Four times a day (QID) | ORAL | Status: DC | PRN

## 2022-08-30 MED ORDER — APIXABAN 2.5 MG PO TABS
2.5000 mg | ORAL_TABLET | Freq: Two times a day (BID) | ORAL | Status: DC
  Administered 2022-08-31 – 2022-09-01 (×3): 2.5 mg via ORAL
  Filled 2022-08-30 (×3): qty 1

## 2022-08-30 MED ORDER — FLEET ENEMA 7-19 GM/118ML RE ENEM: 1.0000 | ENEMA | Freq: Once | RECTAL | Status: DC | PRN

## 2022-08-30 MED ORDER — LORATADINE 10 MG PO TABS
10.0000 mg | ORAL_TABLET | Freq: Every day | ORAL | Status: DC
  Administered 2022-08-31 – 2022-09-01 (×2): 10 mg via ORAL
  Filled 2022-08-30 (×2): qty 1

## 2022-08-30 MED ORDER — ONDANSETRON HCL 4 MG/2ML IJ SOLN: 4.0000 mg | Freq: Four times a day (QID) | INTRAMUSCULAR | Status: DC | PRN

## 2022-08-30 MED ORDER — PHENYLEPHRINE HCL-NACL 20-0.9 MG/250ML-% IV SOLN
INTRAVENOUS | Status: DC | PRN
  Administered 2022-08-30: 40 ug/min via INTRAVENOUS

## 2022-08-30 MED ORDER — LACTATED RINGERS IV SOLN: INTRAVENOUS | Status: DC

## 2022-08-30 MED ORDER — ALBUMIN HUMAN 5 % IV SOLN
INTRAVENOUS | Status: AC
  Filled 2022-08-30: qty 250

## 2022-08-30 MED ORDER — ONDANSETRON HCL 4 MG/2ML IJ SOLN
INTRAMUSCULAR | Status: AC
  Filled 2022-08-30: qty 2

## 2022-08-30 MED ORDER — HYDROCODONE-ACETAMINOPHEN 5-325 MG PO TABS
1.0000 | ORAL_TABLET | ORAL | Status: DC | PRN
  Administered 2022-08-30 – 2022-08-31 (×5): 1 via ORAL
  Administered 2022-09-01 (×2): 2 via ORAL
  Filled 2022-08-30 (×2): qty 1
  Filled 2022-08-30 (×2): qty 2
  Filled 2022-08-30 (×3): qty 1

## 2022-08-30 MED ORDER — MIDAZOLAM HCL 5 MG/5ML IJ SOLN
INTRAMUSCULAR | Status: DC | PRN
  Administered 2022-08-30: 1 mg via INTRAVENOUS
  Administered 2022-08-30: .5 mg via INTRAVENOUS

## 2022-08-30 MED ORDER — NITROGLYCERIN 0.4 MG SL SUBL: 0.4000 mg | SUBLINGUAL_TABLET | SUBLINGUAL | Status: DC | PRN

## 2022-08-30 MED ORDER — POVIDONE-IODINE 10 % EX SWAB
2.0000 | Freq: Once | CUTANEOUS | Status: AC
  Administered 2022-08-30: 2 via TOPICAL

## 2022-08-30 MED ORDER — ISOSORBIDE MONONITRATE ER 30 MG PO TB24
30.0000 mg | ORAL_TABLET | Freq: Every day | ORAL | Status: DC
  Administered 2022-08-30 – 2022-08-31 (×2): 30 mg via ORAL
  Filled 2022-08-30 (×2): qty 1

## 2022-08-30 MED ORDER — PROPOFOL 1000 MG/100ML IV EMUL
INTRAVENOUS | Status: AC
  Filled 2022-08-30: qty 100

## 2022-08-30 MED ORDER — FENTANYL CITRATE PF 50 MCG/ML IJ SOSY
PREFILLED_SYRINGE | INTRAMUSCULAR | Status: AC
  Filled 2022-08-30: qty 1

## 2022-08-30 MED ORDER — ONDANSETRON HCL 4 MG PO TABS: 4.0000 mg | ORAL_TABLET | Freq: Four times a day (QID) | ORAL | Status: DC | PRN

## 2022-08-30 MED ORDER — DEXAMETHASONE SODIUM PHOSPHATE 10 MG/ML IJ SOLN
10.0000 mg | Freq: Once | INTRAMUSCULAR | Status: AC
  Administered 2022-08-31: 10 mg via INTRAVENOUS
  Filled 2022-08-30: qty 1

## 2022-08-30 MED ORDER — METHOCARBAMOL 500 MG IVPB - SIMPLE MED
INTRAVENOUS | Status: AC
  Filled 2022-08-30: qty 55

## 2022-08-30 MED ORDER — LIDOCAINE HCL (CARDIAC) PF 100 MG/5ML IV SOSY
PREFILLED_SYRINGE | INTRAVENOUS | Status: DC | PRN
  Administered 2022-08-30: 40 mg via INTRAVENOUS

## 2022-08-30 MED ORDER — TRAMADOL HCL 50 MG PO TABS: 50.0000 mg | ORAL_TABLET | Freq: Four times a day (QID) | ORAL | Status: DC | PRN

## 2022-08-30 MED ORDER — SODIUM CHLORIDE 0.9 % IV SOLN: INTRAVENOUS | Status: DC

## 2022-08-30 MED ORDER — ALBUMIN HUMAN 5 % IV SOLN: INTRAVENOUS | Status: DC | PRN

## 2022-08-30 MED ORDER — TRAZODONE HCL 50 MG PO TABS
25.0000 mg | ORAL_TABLET | Freq: Every day | ORAL | Status: DC
  Administered 2022-08-30 – 2022-08-31 (×2): 25 mg via ORAL
  Filled 2022-08-30 (×2): qty 1

## 2022-08-30 MED ORDER — BUPIVACAINE IN DEXTROSE 0.75-8.25 % IT SOLN
INTRATHECAL | Status: DC | PRN
  Administered 2022-08-30: 1.8 mL via INTRATHECAL

## 2022-08-30 MED ORDER — ORAL CARE MOUTH RINSE: 15.0000 mL | Freq: Once | OROMUCOSAL | Status: AC

## 2022-08-30 MED ORDER — FENTANYL CITRATE PF 50 MCG/ML IJ SOSY
25.0000 ug | PREFILLED_SYRINGE | INTRAMUSCULAR | Status: DC | PRN
  Administered 2022-08-30 (×3): 50 ug via INTRAVENOUS

## 2022-08-30 MED ORDER — BUPIVACAINE-EPINEPHRINE (PF) 0.5% -1:200000 IJ SOLN
INTRAMUSCULAR | Status: AC
  Filled 2022-08-30: qty 30

## 2022-08-30 MED ORDER — PHENOL 1.4 % MT LIQD: 1.0000 | OROMUCOSAL | Status: DC | PRN

## 2022-08-30 MED ORDER — DOCUSATE SODIUM 100 MG PO CAPS
100.0000 mg | ORAL_CAPSULE | Freq: Two times a day (BID) | ORAL | Status: DC
  Administered 2022-08-30 – 2022-09-01 (×4): 100 mg via ORAL
  Filled 2022-08-30 (×4): qty 1

## 2022-08-30 MED ORDER — LIDOCAINE HCL (PF) 2 % IJ SOLN
INTRAMUSCULAR | Status: AC
  Filled 2022-08-30: qty 5

## 2022-08-30 MED ORDER — FENTANYL CITRATE (PF) 100 MCG/2ML IJ SOLN
INTRAMUSCULAR | Status: AC
  Filled 2022-08-30: qty 2

## 2022-08-30 MED ORDER — DEXAMETHASONE SODIUM PHOSPHATE 10 MG/ML IJ SOLN
INTRAMUSCULAR | Status: AC
  Filled 2022-08-30: qty 1

## 2022-08-30 MED ORDER — PROPOFOL 10 MG/ML IV BOLUS
INTRAVENOUS | Status: DC | PRN
  Administered 2022-08-30: 25 mg via INTRAVENOUS
  Administered 2022-08-30 (×3): 10 mg via INTRAVENOUS

## 2022-08-30 MED ORDER — METHOCARBAMOL 500 MG PO TABS
500.0000 mg | ORAL_TABLET | Freq: Four times a day (QID) | ORAL | Status: DC | PRN
  Administered 2022-08-30 – 2022-09-01 (×5): 500 mg via ORAL
  Filled 2022-08-30 (×5): qty 1

## 2022-08-30 MED ORDER — FENTANYL CITRATE PF 50 MCG/ML IJ SOSY: 25.0000 ug | PREFILLED_SYRINGE | INTRAMUSCULAR | Status: DC | PRN

## 2022-08-30 MED ORDER — FLUTICASONE PROPIONATE 50 MCG/ACT NA SUSP: 2.0000 | Freq: Every day | NASAL | Status: DC | PRN

## 2022-08-30 MED ORDER — 0.9 % SODIUM CHLORIDE (POUR BTL) OPTIME
TOPICAL | Status: DC | PRN
  Administered 2022-08-30: 1000 mL

## 2022-08-30 MED ORDER — ACETAMINOPHEN 10 MG/ML IV SOLN
1000.0000 mg | Freq: Four times a day (QID) | INTRAVENOUS | Status: DC
  Administered 2022-08-30: 1000 mg via INTRAVENOUS
  Filled 2022-08-30: qty 100

## 2022-08-30 MED ORDER — POLYETHYLENE GLYCOL 3350 17 G PO PACK: 17.0000 g | PACK | Freq: Every day | ORAL | Status: DC | PRN

## 2022-08-30 MED ORDER — VANCOMYCIN HCL IN DEXTROSE 1-5 GM/200ML-% IV SOLN
1000.0000 mg | INTRAVENOUS | Status: AC
  Administered 2022-08-30: 1000 mg via INTRAVENOUS
  Filled 2022-08-30: qty 200

## 2022-08-30 MED ORDER — MORPHINE SULFATE (PF) 2 MG/ML IV SOLN: 1.0000 mg | INTRAVENOUS | Status: DC | PRN

## 2022-08-30 MED ORDER — PHENYLEPHRINE HCL-NACL 20-0.9 MG/250ML-% IV SOLN
INTRAVENOUS | Status: AC
  Filled 2022-08-30: qty 250

## 2022-08-30 MED ORDER — LEVOTHYROXINE SODIUM 50 MCG PO TABS
50.0000 ug | ORAL_TABLET | Freq: Every morning | ORAL | Status: DC
  Administered 2022-08-31 – 2022-09-01 (×2): 50 ug via ORAL
  Filled 2022-08-30 (×2): qty 1

## 2022-08-30 MED ORDER — METOCLOPRAMIDE HCL 5 MG/ML IJ SOLN: 5.0000 mg | Freq: Three times a day (TID) | INTRAMUSCULAR | Status: DC | PRN

## 2022-08-30 MED ORDER — BISACODYL 10 MG RE SUPP: 10.0000 mg | Freq: Every day | RECTAL | Status: DC | PRN

## 2022-08-30 MED ORDER — FENTANYL CITRATE PF 50 MCG/ML IJ SOSY
PREFILLED_SYRINGE | INTRAMUSCULAR | Status: AC
  Filled 2022-08-30: qty 2

## 2022-08-30 MED ORDER — CHLORHEXIDINE GLUCONATE 0.12 % MT SOLN
15.0000 mL | Freq: Once | OROMUCOSAL | Status: AC
  Administered 2022-08-30: 15 mL via OROMUCOSAL

## 2022-08-30 MED ORDER — PROPOFOL 500 MG/50ML IV EMUL
INTRAVENOUS | Status: DC | PRN
  Administered 2022-08-30: 30 ug/kg/min via INTRAVENOUS

## 2022-08-30 MED ORDER — VANCOMYCIN HCL IN DEXTROSE 1-5 GM/200ML-% IV SOLN
1000.0000 mg | Freq: Two times a day (BID) | INTRAVENOUS | Status: AC
  Administered 2022-08-30: 1000 mg via INTRAVENOUS
  Filled 2022-08-30: qty 200

## 2022-08-30 MED ORDER — METOCLOPRAMIDE HCL 5 MG PO TABS: 5.0000 mg | ORAL_TABLET | Freq: Three times a day (TID) | ORAL | Status: DC | PRN

## 2022-08-30 MED ORDER — BUPIVACAINE-EPINEPHRINE (PF) 0.5% -1:200000 IJ SOLN
INTRAMUSCULAR | Status: DC | PRN
  Administered 2022-08-30: 30 mL via PERINEURAL

## 2022-08-30 MED ORDER — DILTIAZEM HCL ER COATED BEADS 120 MG PO CP24
120.0000 mg | ORAL_CAPSULE | Freq: Every day | ORAL | Status: DC
  Administered 2022-08-31 – 2022-09-01 (×2): 120 mg via ORAL
  Filled 2022-08-30 (×2): qty 1

## 2022-08-30 MED ORDER — MIDAZOLAM HCL 2 MG/2ML IJ SOLN
INTRAMUSCULAR | Status: AC
  Filled 2022-08-30: qty 2

## 2022-08-30 MED ORDER — WATER FOR IRRIGATION, STERILE IR SOLN
Status: DC | PRN
  Administered 2022-08-30: 2000 mL

## 2022-08-30 MED ORDER — METHOCARBAMOL 500 MG IVPB - SIMPLE MED
500.0000 mg | Freq: Four times a day (QID) | INTRAVENOUS | Status: DC | PRN
  Administered 2022-08-30: 500 mg via INTRAVENOUS

## 2022-08-30 MED ORDER — MENTHOL 3 MG MT LOZG: 1.0000 | LOZENGE | OROMUCOSAL | Status: DC | PRN

## 2022-08-30 SURGICAL SUPPLY — 43 items
BAG COUNTER SPONGE SURGICOUNT (BAG) IMPLANT
BAG DECANTER FOR FLEXI CONT (MISCELLANEOUS) IMPLANT
BAG ZIPLOCK 12X15 (MISCELLANEOUS) IMPLANT
BLADE SAG 18X100X1.27 (BLADE) ×1 IMPLANT
COVER PERINEAL POST (MISCELLANEOUS) ×1 IMPLANT
COVER SURGICAL LIGHT HANDLE (MISCELLANEOUS) ×1 IMPLANT
CUP ACET PINNACLE SECTR 50MM (Hips) IMPLANT
DRAPE FOOT SWITCH (DRAPES) ×1 IMPLANT
DRAPE STERI IOBAN 125X83 (DRAPES) ×1 IMPLANT
DRAPE U-SHAPE 47X51 STRL (DRAPES) ×2 IMPLANT
DRSG AQUACEL AG ADV 3.5X10 (GAUZE/BANDAGES/DRESSINGS) ×1 IMPLANT
DURAPREP 26ML APPLICATOR (WOUND CARE) ×1 IMPLANT
ELECT REM PT RETURN 15FT ADLT (MISCELLANEOUS) ×1 IMPLANT
GLOVE BIO SURGEON STRL SZ 6.5 (GLOVE) IMPLANT
GLOVE BIO SURGEON STRL SZ7.5 (GLOVE) IMPLANT
GLOVE BIO SURGEON STRL SZ8 (GLOVE) ×1 IMPLANT
GLOVE BIOGEL PI IND STRL 6.5 (GLOVE) IMPLANT
GLOVE BIOGEL PI IND STRL 7.0 (GLOVE) IMPLANT
GLOVE BIOGEL PI IND STRL 8 (GLOVE) ×1 IMPLANT
GOWN STRL REUS W/ TWL LRG LVL3 (GOWN DISPOSABLE) ×1 IMPLANT
GOWN STRL REUS W/ TWL XL LVL3 (GOWN DISPOSABLE) IMPLANT
GOWN STRL REUS W/TWL LRG LVL3 (GOWN DISPOSABLE) ×1
GOWN STRL REUS W/TWL XL LVL3 (GOWN DISPOSABLE)
HEAD FEM STD 32X+5 STRL (Hips) IMPLANT
HOLDER FOLEY CATH W/STRAP (MISCELLANEOUS) ×1 IMPLANT
KIT TURNOVER KIT A (KITS) IMPLANT
LINER MARATHON 32 50 (Hips) IMPLANT
MANIFOLD NEPTUNE II (INSTRUMENTS) ×1 IMPLANT
PACK ANTERIOR HIP CUSTOM (KITS) ×1 IMPLANT
PENCIL SMOKE EVACUATOR COATED (MISCELLANEOUS) ×1 IMPLANT
PINNACLE SECTOR CUP 50MM (Hips) ×1 IMPLANT
SPIKE FLUID TRANSFER (MISCELLANEOUS) ×1 IMPLANT
STEM FEMORAL SZ 5MM STD ACTIS (Stem) IMPLANT
STRIP CLOSURE SKIN 1/2X4 (GAUZE/BANDAGES/DRESSINGS) ×1 IMPLANT
SUT ETHIBOND NAB CT1 #1 30IN (SUTURE) ×1 IMPLANT
SUT MNCRL AB 4-0 PS2 18 (SUTURE) ×1 IMPLANT
SUT STRATAFIX 0 PDS 27 VIOLET (SUTURE) ×2
SUT VIC AB 2-0 CT1 27 (SUTURE) ×3
SUT VIC AB 2-0 CT1 TAPERPNT 27 (SUTURE) ×2 IMPLANT
SUTURE STRATFX 0 PDS 27 VIOLET (SUTURE) ×1 IMPLANT
TAPE STRIPS DRAPE STRL (GAUZE/BANDAGES/DRESSINGS) IMPLANT
TRAY FOLEY MTR SLVR 16FR STAT (SET/KITS/TRAYS/PACK) ×1 IMPLANT
TUBE SUCTION HIGH CAP CLEAR NV (SUCTIONS) ×1 IMPLANT

## 2022-08-30 NOTE — Care Plan (Signed)
Ortho Bundle Case Management Note  Patient Details  Name: Samantha Short MRN: [id number] Date of Birth: [birth year]                  R THA on 08-30-22  DCP: home with dtr  DME: RW ordered through [employer]  PT: HEP   DME Arranged:  Walker rolling DME Agency:  [employer]    Additional Comments: Please contact me with any questions of if this plan should need to change.  Ennis Forts, RN,CCM [facility]  [phone] 08/30/2022, 3:57 PM

## 2022-08-30 NOTE — Progress Notes (Signed)
Pt. C/o "discomfort" in chest. Descibed it as "fluttering". 12 lead EKG done. Dr. Despina Hick aware. Dr. Armond Hang notified.

## 2022-08-30 NOTE — Plan of Care (Signed)
  Problem: Education: Goal: Knowledge of the prescribed therapeutic regimen will improve Outcome: Progressing   Problem: Activity: Goal: Ability to avoid complications of mobility impairment will improve Outcome: Progressing   Problem: Pain Management: Goal: Pain level will decrease with appropriate interventions Outcome: Progressing   Problem: Education: Goal: Knowledge of General Education information will improve Description: Including pain rating scale, medication(s)/side effects and non-pharmacologic comfort measures Outcome: Progressing   Problem: Activity: Goal: Risk for activity intolerance will decrease Outcome: Progressing   Problem: Elimination: Goal: Will not experience complications related to bowel motility Outcome: Progressing   Problem: Safety: Goal: Ability to remain free from injury will improve Outcome: Progressing   

## 2022-08-30 NOTE — Transfer of Care (Signed)
Immediate Anesthesia Transfer of Care Note  Patient: Samantha Short  Procedure(s) Performed: TOTAL HIP ARTHROPLASTY ANTERIOR APPROACH (Right: Hip)  Patient Location: PACU  Anesthesia Type:Spinal  Level of Consciousness: awake, alert , oriented, and patient cooperative  Airway & Oxygen Therapy: Patient Spontanous Breathing and Patient connected to face mask oxygen  Post-op Assessment: Report given to RN and Post -op Vital signs reviewed and stable  Post vital signs: Reviewed and stable  Last Vitals:  Vitals Value Taken Time  BP    Temp    Pulse    Resp    SpO2      Last Pain:  Vitals:   08/30/22 0955  TempSrc:   PainSc: 0-No pain         Complications: No notable events documented.

## 2022-08-30 NOTE — Evaluation (Signed)
Physical Therapy Evaluation Patient Details Name: Samantha Short MRN: [id number] DOB: 1946-05-30 Today's Date: 08/30/2022  History of Present Illness  76 yo female s/p R THA-DA 12/20  Clinical Impression  On eval, pt was Min A for mobility. She walked ~55 feet with a RW. Moderate burning pain and tightness with activity. Will plan to follow during hospital stay. Plan is for d/c home with HEP. Pt reports her family is ill with the flu (FYI).        Recommendations for follow up therapy are one component of a multi-disciplinary discharge planning process, led by the attending physician.  Recommendations may be updated based on patient status, additional functional criteria and insurance authorization.  Follow Up Recommendations Follow physician's recommendations for discharge plan and follow up therapies      Assistance Recommended at Discharge Intermittent Supervision/Assistance  Patient can return home with the following  A little help with walking and/or transfers;A little help with bathing/dressing/bathroom;Assist for transportation;Assistance with cooking/housework;Help with stairs or ramp for entrance    Equipment Recommendations Rolling walker (2 wheels)  Recommendations for Other Services       Functional Status Assessment       Precautions / Restrictions Precautions Precautions: Fall Restrictions Weight Bearing Restrictions: No Other Position/Activity Restrictions: WBAT      Mobility  Bed Mobility Overal bed mobility: Needs Assistance Bed Mobility: Supine to Sit     Supine to sit: Min assist, HOB elevated     General bed mobility comments: Min A for R LE and to scoot to EOB. Increased time. Cues provided.    Transfers Overall transfer level: Needs assistance Equipment used: Rolling walker (2 wheels) Transfers: Sit to/from Stand Sit to Stand: Min guard, From elevated surface           General transfer comment: Min guard A for safety. Cues for hand  placement.    Ambulation/Gait Ambulation/Gait assistance: Min guard Gait Distance (Feet): 55 Feet Assistive device: Rolling walker (2 wheels) Gait Pattern/deviations: Step-through pattern, Decreased stride length       General Gait Details: Min guard A for safety. Pt tolerated distance well. No dizziness reported.  Stairs            Wheelchair Mobility    Modified Rankin (Stroke Patients Only)       Balance Overall balance assessment: Needs assistance         Standing balance support: Bilateral upper extremity supported, Reliant on assistive device for balance, During functional activity Standing balance-Leahy Scale: Fair                               Pertinent Vitals/Pain Pain Assessment Pain Assessment: 0-10 Pain Score: 6  Pain Location: R hip/thigh Pain Descriptors / Indicators: Burning, Tightness Pain Intervention(s): Monitored during session, Ice applied, Repositioned    Home Living Family/patient expects to be discharged to:: Private residence Living Arrangements: Children;Other relatives Available Help at Discharge: Family Type of Home: House Home Access: Ramped entrance       Home Layout: One level Home Equipment: Rollator (4 wheels) (walking stick)      Prior Function Prior Level of Function : Independent/Modified Independent                     Hand Dominance        Extremity/Trunk Assessment   Upper Extremity Assessment Upper Extremity Assessment: Overall WFL for tasks assessed    Lower Extremity  Assessment Lower Extremity Assessment: Generalized weakness    Cervical / Trunk Assessment Cervical / Trunk Assessment: Normal  Communication   Communication: No difficulties  Cognition Arousal/Alertness: Awake/alert Behavior During Therapy: WFL for tasks assessed/performed Overall Cognitive Status: Within Functional Limits for tasks assessed                                          General  Comments      Exercises     Assessment/Plan    PT Assessment Patient needs continued PT services  PT Problem List Decreased strength;Decreased range of motion;Decreased activity tolerance;Decreased balance;Decreased mobility;Pain;Decreased knowledge of use of DME       PT Treatment Interventions DME instruction;Gait training;Therapeutic exercise;Balance training;Stair training;Functional mobility training;Therapeutic activities;Patient/family education    PT Goals (Current goals can be found in the Care Plan section)  Acute Rehab PT Goals Patient Stated Goal: less pain. regain PLOF/independence PT Goal Formulation: With patient Time For Goal Achievement: 09/13/22 Potential to Achieve Goals: Good    Frequency 7X/week     Co-evaluation               AM-PAC PT "6 Clicks" Mobility  Outcome Measure Help needed turning from your back to your side while in a flat bed without using bedrails?: A Little Help needed moving from lying on your back to sitting on the side of a flat bed without using bedrails?: A Little Help needed moving to and from a bed to a chair (including a wheelchair)?: A Little Help needed standing up from a chair using your arms (e.g., wheelchair or bedside chair)?: A Little Help needed to walk in hospital room?: A Little Help needed climbing 3-5 steps with a railing? : A Little 6 Click Score: 18    End of Session Equipment Utilized During Treatment: Gait belt Activity Tolerance: Patient tolerated treatment well Patient left: in chair;with call bell/phone within reach;with chair alarm set   PT Visit Diagnosis: Other abnormalities of gait and mobility (R26.89)    Time: 0223-3612 PT Time Calculation (min) (ACUTE ONLY): 23 min   Charges:   PT Evaluation $PT Eval Low Complexity: 1 Low PT Treatments $Gait Training: 8-22 mins           Faye Ramsay, PT Acute Rehabilitation  Office: [phone]

## 2022-08-30 NOTE — Anesthesia Postprocedure Evaluation (Signed)
Anesthesia Post Note  Patient: Acupuncturist  Procedure(s) Performed: TOTAL HIP ARTHROPLASTY ANTERIOR APPROACH (Right: Hip)     Patient location during evaluation: PACU Anesthesia Type: Spinal Level of consciousness: oriented and awake and alert Pain management: pain level controlled Vital Signs Assessment: post-procedure vital signs reviewed and stable Respiratory status: spontaneous breathing, respiratory function stable and patient connected to nasal cannula oxygen Cardiovascular status: blood pressure returned to baseline and stable Postop Assessment: no headache, no backache and no apparent nausea or vomiting Anesthetic complications: no  No notable events documented.  Last Vitals:  Vitals:   08/30/22 1430 08/30/22 1445  BP: (!) 152/74 (!) 169/63  Pulse: 61 79  Resp: 12 11  Temp:  (!) 36.3 C  SpO2: 100% 100%    Last Pain:  Vitals:   08/30/22 1445  TempSrc:   PainSc: 4                  Quinlynn Cuthbert L Mariah Harn

## 2022-08-30 NOTE — Discharge Instructions (Signed)
°Samantha Aluisio, MD °Total Joint Specialist °[facility] °[address] °[place], [place] [address] °[phone] ° °ANTERIOR APPROACH TOTAL HIP REPLACEMENT POSTOPERATIVE DIRECTIONS ° ° ° ° °Hip Rehabilitation, Guidelines Following Surgery  °The results of a hip operation are greatly improved after range of motion and muscle strengthening exercises. Follow all safety measures which are given to protect your hip. If any of these exercises cause increased pain or swelling in your joint, decrease the amount until you are comfortable again. Then slowly increase the exercises. Call your caregiver if you have problems or questions.  ° °HOME CARE INSTRUCTIONS  °Remove items at home which could result in a fall. This includes throw rugs or furniture in walking pathways.  °ICE to the affected hip as frequently as 20-30 minutes an hour and then as needed for pain and swelling. Continue to use ice on the hip for pain and swelling from surgery. You may notice swelling that will progress down to the foot and ankle. This is normal after surgery. Elevate the leg when you are not up walking on it.   °Continue to use the breathing machine which will help keep your temperature down.  It is common for your temperature to cycle up and down following surgery, especially at night when you are not up moving around and exerting yourself.  The breathing machine keeps your lungs expanded and your temperature down. ° °DIET °You may resume your previous home diet once your are discharged from the hospital. ° °DRESSING / WOUND CARE / SHOWERING °You have an adhesive waterproof bandage over the incision. Leave this in place until your first follow-up appointment. Once you remove this you will not need to place another bandage.  °You may begin showering 3 days following surgery, but do not submerge the incision under water. ° °ACTIVITY °For the first 3-5 days, it is important to rest and keep the operative leg elevated.  You should, as a general rule, rest for 50 minutes and walk/stretch for 10 minutes per hour. After 5 days, you may slowly increase activity as tolerated.  °Perform the exercises you were provided twice a day for about 15-20 minutes each session. Begin these 2 days following surgery. °Walk with your walker as instructed. Use the walker until you are comfortable transitioning to a cane. Walk with the cane in the opposite hand of the operative leg. You may discontinue the cane once you are comfortable and walking steadily. °Avoid periods of inactivity such as sitting longer than an hour when not asleep. This helps prevent blood clots.  °Do not drive a car for 6 weeks or until released by your surgeon.  °Do not drive while taking narcotics. ° °TED HOSE STOCKINGS °Wear the elastic stockings on both legs for three weeks following surgery during the day. You may remove them at night while sleeping. ° °WEIGHT BEARING °Weight bearing as tolerated with assist device (walker, cane, etc) as directed, use it as long as suggested by your surgeon or therapist, typically at least 4-6 weeks. ° °POSTOPERATIVE CONSTIPATION PROTOCOL °Constipation - defined medically as fewer than three stools per week and severe constipation as less than one stool per week. ° °One of the most common issues patients have following surgery is constipation.  Even if you have a regular bowel pattern at home, your normal regimen is likely to be disrupted due to multiple reasons following surgery.  Combination of anesthesia, postoperative narcotics, change in appetite and fluid intake all can affect your bowels.    In order to avoid complications following surgery, here are some recommendations in order to help you during your recovery period. ° °Colace (docusate) - Pick up an over-the-counter form of Colace or another stool softener and take twice a day as long as you are requiring postoperative pain medications.  Take with a full glass of water daily.  If  you experience loose stools or diarrhea, hold the colace until you stool forms back up.  If your symptoms do not get better within 1 week or if they get worse, check with your doctor. °Dulcolax (bisacodyl) - Pick up over-the-counter and take as directed by the product packaging as needed to assist with the movement of your bowels.  Take with a full glass of water.  Use this product as needed if not relieved by Colace only.  °MiraLax (polyethylene glycol) - Pick up over-the-counter to have on hand.  MiraLax is a solution that will increase the amount of water in your bowels to assist with bowel movements.  Take as directed and can mix with a glass of water, juice, soda, coffee, or tea.  Take if you go more than two days without a movement.Do not use MiraLax more than once per day. Call your doctor if you are still constipated or irregular after using this medication for 7 days in a row. ° °If you continue to have problems with postoperative constipation, please contact the office for further assistance and recommendations.  If you experience "the worst abdominal pain ever" or develop nausea or vomiting, please contact the office immediatly for further recommendations for treatment. ° °ITCHING ° If you experience itching with your medications, try taking only a single pain pill, or even half a pain pill at a time.  You can also use Benadryl over the counter for itching or also to help with sleep.  ° °MEDICATIONS °See your medication summary on the “After Visit Summary” that the nursing staff will review with you prior to discharge.  You may have some home medications which will be placed on hold until you complete the course of blood thinner medication.  It is important for you to complete the blood thinner medication as prescribed by your surgeon.  Continue your approved medications as instructed at time of discharge. ° °PRECAUTIONS °If you experience chest pain or shortness of breath - call 911 immediately for  transfer to the hospital emergency department.  °If you develop a fever greater that 101 F, purulent drainage from wound, increased redness or drainage from wound, foul odor from the wound/dressing, or calf pain - CONTACT YOUR SURGEON.   °                                                °FOLLOW-UP APPOINTMENTS °Make sure you keep all of your appointments after your operation with your surgeon and caregivers. You should call the office at the above phone number and make an appointment for approximately two weeks after the date of your surgery or on the date instructed by your surgeon outlined in the "After Visit Summary". ° °RANGE OF MOTION AND STRENGTHENING EXERCISES  °These exercises are designed to help you keep full movement of your hip joint. Follow your caregiver's or physical therapist's instructions. Perform all exercises about fifteen times, three times per day or as directed. Exercise both hips, even if you have had only   one joint replacement. These exercises can be done on a training (exercise) mat, on the floor, on a table or on a bed. Use whatever works the best and is most comfortable for you. Use music or television while you are exercising so that the exercises are a pleasant break in your day. This will make your life better with the exercises acting as a break in routine you can look forward to.  °Lying on your back, slowly slide your foot toward your buttocks, raising your knee up off the floor. Then slowly slide your foot back down until your leg is straight again.  °Lying on your back spread your legs as far apart as you can without causing discomfort.  °Lying on your side, raise your upper leg and foot straight up from the floor as far as is comfortable. Slowly lower the leg and repeat.  °Lying on your back, tighten up the muscle in the front of your thigh (quadriceps muscles). You can do this by keeping your leg straight and trying to raise your heel off the floor. This helps strengthen the  largest muscle supporting your knee.  °Lying on your back, tighten up the muscles of your buttocks both with the legs straight and with the knee bent at a comfortable angle while keeping your heel on the floor.  ° °POST-OPERATIVE OPIOID TAPER INSTRUCTIONS: °It is important to wean off of your opioid medication as soon as possible. If you do not need pain medication after your surgery it is ok to stop day one. °Opioids include: °Codeine, Hydrocodone(Norco, Vicodin), Oxycodone(Percocet, oxycontin) and hydromorphone amongst others.  °Long term and even short term use of opiods can cause: °Increased pain response °Dependence °Constipation °Depression °Respiratory depression °And more.  °Withdrawal symptoms can include °Flu like symptoms °Nausea, vomiting °And more °Techniques to manage these symptoms °Hydrate well °Eat regular healthy meals °Stay active °Use relaxation techniques(deep breathing, meditating, yoga) °Do Not substitute Alcohol to help with tapering °If you have been on opioids for less than two weeks and do not have pain than it is ok to stop all together.  °Plan to wean off of opioids °This plan should start within one week post op of your joint replacement. °Maintain the same interval or time between taking each dose and first decrease the dose.  °Cut the total daily intake of opioids by one tablet each day °Next start to increase the time between doses. °The last dose that should be eliminated is the evening dose.  ° °IF YOU ARE TRANSFERRED TO A SKILLED REHAB FACILITY °If the patient is transferred to a skilled rehab facility following release from the hospital, a list of the current medications will be sent to the facility for the patient to continue.  When discharged from the skilled rehab facility, please have the facility set up the patient's Home Health Physical Therapy prior to being released. Also, the skilled facility will be responsible for providing the patient with their medications at time of  release from the facility to include their pain medication, the muscle relaxants, and their blood thinner medication. If the patient is still at the rehab facility at time of the two week follow up appointment, the skilled rehab facility will also need to assist the patient in arranging follow up appointment in our office and any transportation needs. ° °MAKE SURE YOU:  °Understand these instructions.  °Get help right away if you are not doing well or get worse.  ° ° °DENTAL ANTIBIOTICS: ° °In most   cases prophylactic antibiotics for Dental procdeures after total joint surgery are not necessary. ° °Exceptions are as follows: ° °1. History of prior total joint infection ° °2. Severely immunocompromised (Organ Transplant, cancer chemotherapy, Rheumatoid biologic °meds such as Humera) ° °3. Poorly controlled diabetes (A1C &gt; 8.0, blood glucose over 200) ° °If you have one of these conditions, contact your surgeon for an antibiotic prescription, prior to your °dental procedure.  ° ° °Pick up stool softner and laxative for home use following surgery while on pain medications. °Do not submerge incision under water. °Please use good hand washing techniques while changing dressing each day. °May shower starting three days after surgery. °Please use a clean towel to pat the incision dry following showers. °Continue to use ice for pain and swelling after surgery. °Do not use any lotions or creams on the incision until instructed by your surgeon. ° °

## 2022-08-30 NOTE — Anesthesia Procedure Notes (Addendum)
Spinal  Patient location during procedure: OR Start time: 08/30/2022 11:10 AM End time: 08/30/2022 11:15 AM Reason for block: surgical anesthesia Staffing Performed: anesthesiologist  Anesthesiologist: Elmer Picker, MD Performed by: Elmer Picker, MD Authorized by: Elmer Picker, MD   Preanesthetic Checklist Completed: patient identified, IV checked, risks and benefits discussed, surgical consent, monitors and equipment checked, pre-op evaluation and timeout performed Spinal Block Patient position: sitting Prep: DuraPrep and site prepped and draped Patient monitoring: cardiac monitor, continuous pulse ox and blood pressure Approach: midline Location: L3-4 Injection technique: single-shot Needle Needle type: Pencan  Needle gauge: 24 G Needle length: 9 cm Assessment Sensory level: T6 Events: CSF return Additional Notes Functioning IV was confirmed and monitors were applied. Sterile prep and drape, including hand hygiene and sterile gloves were used. The patient was positioned and the spine was prepped. The skin was anesthetized with lidocaine.  Free flow of clear CSF was obtained prior to injecting local anesthetic into the CSF.  The spinal needle aspirated freely following injection.  The needle was carefully withdrawn.  The patient tolerated the procedure well.

## 2022-08-30 NOTE — Op Note (Signed)
OPERATIVE REPORT- TOTAL HIP ARTHROPLASTY   PREOPERATIVE DIAGNOSIS: Osteoarthritis of the Right hip.   POSTOPERATIVE DIAGNOSIS: Osteoarthritis of the Right  hip.   PROCEDURE: Right total hip arthroplasty, anterior approach.   SURGEON: Ollen Gross, MD   ASSISTANT: Arther Abbott, PA-C  ANESTHESIA:  Spinal  ESTIMATED BLOOD LOSS:-250 mL    DRAINS: None  COMPLICATIONS: None   CONDITION: PACU - hemodynamically stable.   BRIEF CLINICAL NOTE: Samantha Short is a 76 y.o. female who has advanced end-  stage arthritis of their Right  hip with progressively worsening pain and  dysfunction.The patient has failed nonoperative management and presents for  total hip arthroplasty.   PROCEDURE IN DETAIL: After successful administration of spinal  anesthetic, the traction boots for the [facility] bed were placed on both  feet and the patient was placed onto the [facility] bed, boots placed into the leg  holders. The Right hip was then isolated from the perineum with plastic  drapes and prepped and draped in the usual sterile fashion. ASIS and  greater trochanter were marked and a oblique incision was made, starting  at about 1 cm lateral and 2 cm distal to the ASIS and coursing towards  the anterior cortex of the femur. The skin was cut with a 10 blade  through subcutaneous tissue to the level of the fascia overlying the  tensor fascia lata muscle. The fascia was then incised in line with the  incision at the junction of the anterior third and posterior 2/3rd. The  muscle was teased off the fascia and then the interval between the TFL  and the rectus was developed. The Hohmann retractor was then placed at  the top of the femoral neck over the capsule. The vessels overlying the  capsule were cauterized and the fat on top of the capsule was removed.  A Hohmann retractor was then placed anterior underneath the rectus  femoris to give exposure to the entire anterior capsule. A T-shaped   capsulotomy was performed. The edges were tagged and the femoral head  was identified.       Osteophytes are removed off the superior acetabulum.  The femoral neck was then cut in situ with an oscillating saw. Traction  was then applied to the left lower extremity utilizing the [facility]  traction. The femoral head was then removed. Retractors were placed  around the acetabulum and then circumferential removal of the labrum was  performed. Osteophytes were also removed. Reaming starts at 45 mm to  medialize and  Increased in 2 mm increments to 49 mm. We reamed in  approximately 40 degrees of abduction, 20 degrees anteversion. A 50 mm  pinnacle acetabular shell was then impacted in anatomic position under  fluoroscopic guidance with excellent purchase. We did not need to place  any additional dome screws. A 32 mm neutral + 4 marathon liner was then  placed into the acetabular shell.       The femoral lift was then placed along the lateral aspect of the femur  just distal to the vastus ridge. The leg was  externally rotated and capsule  was stripped off the inferior aspect of the femoral neck down to the  level of the lesser trochanter, this was done with electrocautery. The femur was lifted after this was performed. The  leg was then placed in an extended and adducted position essentially delivering the femur. We also removed the capsule superiorly and the piriformis from the piriformis fossa to  gain excellent exposure of the  proximal femur. Rongeur was used to remove some cancellous bone to get  into the lateral portion of the proximal femur for placement of the  initial starter reamer. The starter broaches was placed  the starter broach  and was shown to go down the center of the canal. Broaching  with the Actis system was then performed starting at size 0  coursing  Up to size 5. A size 5 had excellent torsional and rotational  and axial stability. The trial standard offset neck was then  placed  with a 32 + 5 trial head. The hip was then reduced. We confirmed that  the stem was in the canal both on AP and lateral x-rays. It also has excellent sizing. The hip was reduced with outstanding stability through full extension and full external rotation.. AP pelvis was taken and the leg lengths were measured and found to be equal. Hip was then dislocated again and the femoral head and neck removed. The  femoral broach was removed. Size 5 Actis stem with a standard offset  neck was then impacted into the femur following native anteversion. Has  excellent purchase in the canal. Excellent torsional and rotational and  axial stability. It is confirmed to be in the canal on AP and lateral  fluoroscopic views. The 32 + 5 metal head was placed and the hip  reduced with outstanding stability. Again AP pelvis was taken and it  confirmed that the leg lengths were equal. The wound was then copiously  irrigated with saline solution and the capsule reattached and repaired  with Ethibond suture. 30 ml of .25% Bupivicaine was  injected into the capsule and into the edge of the tensor fascia lata as well as subcutaneous tissue. The fascia overlying the tensor fascia lata was then closed with a running #1 V-Loc. Subcu was closed with interrupted 2-0 Vicryl and subcuticular running 4-0 Monocryl. Incision was cleaned  and dried. Steri-Strips and a bulky sterile dressing applied. The patient was awakened and transported to  recovery in stable condition.        Please note that a surgical assistant was a medical necessity for this procedure to perform it in a safe and expeditious manner. Assistant was necessary to provide appropriate retraction of vital neurovascular structures and to prevent femoral fracture and allow for anatomic placement of the prosthesis.  Gaynelle Arabian, M.D.

## 2022-08-30 NOTE — Interval H&P Note (Signed)
History and Physical Interval Note:  08/30/2022 9:49 AM  Samantha Short  has presented today for surgery, with the diagnosis of right hip osteoarthritis.  The various methods of treatment have been discussed with the patient and family. After consideration of risks, benefits and other options for treatment, the patient has consented to  Procedure(s): TOTAL HIP ARTHROPLASTY ANTERIOR APPROACH (Right) as a surgical intervention.  The patient's history has been reviewed, patient examined, no change in status, stable for surgery.  I have reviewed the patient's chart and labs.  Questions were answered to the patient's satisfaction.     Homero Fellers Ramey [place]

## 2022-08-31 ENCOUNTER — Encounter (HOSPITAL_COMMUNITY): Payer: Self-pay | Admitting: Orthopedic Surgery

## 2022-08-31 DIAGNOSIS — Z951 Presence of aortocoronary bypass graft: Secondary | ICD-10-CM | POA: Diagnosis not present

## 2022-08-31 DIAGNOSIS — Z950 Presence of cardiac pacemaker: Secondary | ICD-10-CM | POA: Diagnosis not present

## 2022-08-31 DIAGNOSIS — Z7901 Long term (current) use of anticoagulants: Secondary | ICD-10-CM | POA: Diagnosis not present

## 2022-08-31 DIAGNOSIS — N183 Chronic kidney disease, stage 3 unspecified: Secondary | ICD-10-CM | POA: Diagnosis not present

## 2022-08-31 DIAGNOSIS — I5032 Chronic diastolic (congestive) heart failure: Secondary | ICD-10-CM | POA: Diagnosis not present

## 2022-08-31 DIAGNOSIS — Z79899 Other long term (current) drug therapy: Secondary | ICD-10-CM | POA: Diagnosis not present

## 2022-08-31 DIAGNOSIS — Z87891 Personal history of nicotine dependence: Secondary | ICD-10-CM | POA: Diagnosis not present

## 2022-08-31 DIAGNOSIS — M1611 Unilateral primary osteoarthritis, right hip: Secondary | ICD-10-CM | POA: Diagnosis not present

## 2022-08-31 DIAGNOSIS — Z91040 Latex allergy status: Secondary | ICD-10-CM | POA: Diagnosis not present

## 2022-08-31 DIAGNOSIS — I2510 Atherosclerotic heart disease of native coronary artery without angina pectoris: Secondary | ICD-10-CM | POA: Diagnosis not present

## 2022-08-31 DIAGNOSIS — I130 Hypertensive heart and chronic kidney disease with heart failure and stage 1 through stage 4 chronic kidney disease, or unspecified chronic kidney disease: Secondary | ICD-10-CM | POA: Diagnosis not present

## 2022-08-31 DIAGNOSIS — I4891 Unspecified atrial fibrillation: Secondary | ICD-10-CM | POA: Diagnosis not present

## 2022-08-31 DIAGNOSIS — E039 Hypothyroidism, unspecified: Secondary | ICD-10-CM | POA: Diagnosis not present

## 2022-08-31 DIAGNOSIS — J45909 Unspecified asthma, uncomplicated: Secondary | ICD-10-CM | POA: Diagnosis not present

## 2022-08-31 LAB — BASIC METABOLIC PANEL
Anion gap: 6 (ref 5–15)
BUN: 17 mg/dL (ref 8–23)
CO2: 24 mmol/L (ref 22–32)
Calcium: 9 mg/dL (ref 8.9–10.3)
Chloride: 110 mmol/L (ref 98–111)
Creatinine, Ser: 0.94 mg/dL (ref 0.44–1.00)
GFR, Estimated: >60 mL/min (ref >60)
Glucose, Bld: 122 mg/dL — ABNORMAL HIGH (ref 70–99)
Potassium: 4.6 mmol/L (ref 3.5–5.1)
Sodium: 140 mmol/L (ref 135–145)

## 2022-08-31 LAB — CBC
HCT: 38.3 % (ref 36.0–46.0)
Hemoglobin: 12.3 g/dL (ref 12.0–15.0)
MCH: 30 pg (ref 26.0–34.0)
MCHC: 32.1 g/dL (ref 30.0–36.0)
MCV: 93.4 fL (ref 80.0–100.0)
Platelets: 211 10*3/uL (ref 150–400)
RBC: 4.1 MIL/uL (ref 3.87–5.11)
RDW: 13 % (ref 11.5–15.5)
WBC: 14.5 10*3/uL — ABNORMAL HIGH (ref 4.0–10.5)
nRBC: 0 % (ref 0.0–0.2)

## 2022-08-31 NOTE — Progress Notes (Signed)
Physical Therapy Treatment Patient Details Name: Samantha Short MRN: [id number] DOB: 07/30/1946 Today's Date: 08/31/2022   History of Present Illness 76 yo female s/p R THA-DA 12/20    PT Comments    Pt is progressing with mobility. Moderate burning pain with activity. Plan is for d/c home on tomorrow.    Recommendations for follow up therapy are one component of a multi-disciplinary discharge planning process, led by the attending physician.  Recommendations may be updated based on patient status, additional functional criteria and insurance authorization.  Follow Up Recommendations  Follow physician's recommendations for discharge plan and follow up therapies     Assistance Recommended at Discharge Intermittent Supervision/Assistance  Patient can return home with the following A little help with walking and/or transfers;A little help with bathing/dressing/bathroom;Assist for transportation;Assistance with cooking/housework;Help with stairs or ramp for entrance   Equipment Recommendations       Recommendations for Other Services       Precautions / Restrictions Precautions Precautions: Fall Restrictions Weight Bearing Restrictions: No Other Position/Activity Restrictions: WBAT     Mobility  Bed Mobility Overal bed mobility: Needs Assistance Bed Mobility: Sit to Supine       Sit to supine: Min assist   General bed mobility comments: Pt practiced with gait belt. Still needed some assistance to bed LE onto bed. Increased time and effortful for pt.    Transfers Overall transfer level: Needs assistance Equipment used: Rolling walker (2 wheels) Transfers: Sit to/from Stand Sit to Stand: Min guard           General transfer comment: Min guard A for safety. Cues for hand placement.    Ambulation/Gait Ambulation/Gait assistance: Min guard Gait Distance (Feet): 115 Feet Assistive device: Rolling walker (2 wheels) Gait Pattern/deviations: Step-through pattern,  Decreased stride length, Decreased stance time - right, Antalgic       General Gait Details: Min guard A for safety. Pt tolerated distance well. Gait is antalgic-pt reports burning   Stairs             Wheelchair Mobility    Modified Rankin (Stroke Patients Only)       Balance Overall balance assessment: Needs assistance         Standing balance support: Bilateral upper extremity supported, Reliant on assistive device for balance, During functional activity Standing balance-Leahy Scale: Fair                              Cognition Arousal/Alertness: Awake/alert Behavior During Therapy: WFL for tasks assessed/performed Overall Cognitive Status: Within Functional Limits for tasks assessed                                          Exercises Total Joint Exercises Ankle Circles/Pumps: AROM, Both, 10 reps Quad Sets: AROM, Both, 10 reps Heel Slides: AAROM, Right, 5 reps, Seated Hip ABduction/ADduction: AAROM, Right, 10 reps, Seated Knee Flexion: AROM, Right, 10 reps, Standing Marching in Standing: AROM, Right, 10 reps, Standing    General Comments        Pertinent Vitals/Pain Pain Assessment Pain Assessment: 0-10 Pain Score: 7  Pain Location: R hip/thigh Pain Descriptors / Indicators: Burning, Tightness Pain Intervention(s): Monitored during session, Ice applied    Home Living  Prior Function            PT Goals (current goals can now be found in the care plan section) Progress towards PT goals: Progressing toward goals    Frequency    7X/week      PT Plan Current plan remains appropriate    Co-evaluation              AM-PAC PT "6 Clicks" Mobility   Outcome Measure  Help needed turning from your back to your side while in a flat bed without using bedrails?: A Little Help needed moving from lying on your back to sitting on the side of a flat bed without using bedrails?: A  Little Help needed moving to and from a bed to a chair (including a wheelchair)?: A Little Help needed standing up from a chair using your arms (e.g., wheelchair or bedside chair)?: A Little Help needed to walk in hospital room?: A Little Help needed climbing 3-5 steps with a railing? : A Little 6 Click Score: 18    End of Session Equipment Utilized During Treatment: Gait belt Activity Tolerance: Patient tolerated treatment well Patient left: with call bell/phone within reach;in bed;with bed alarm set   PT Visit Diagnosis: Other abnormalities of gait and mobility (R26.89)     Time: 1230-1250 PT Time Calculation (min) (ACUTE ONLY): 20 min  Charges:  $Gait Training: 8-22 mins                         Doreatha Massed, PT Acute Rehabilitation  Office: [phone]

## 2022-08-31 NOTE — Progress Notes (Signed)
Subjective: 1 Day Post-Op Procedure(s) (LRB): TOTAL HIP ARTHROPLASTY ANTERIOR APPROACH (Right) Patient seen in rounds by Dr. Wynelle Link. Patient is well, and has had no acute complaints or problems. No issues overnight. Denies SOB or chest pain. Denies calf pain. Foley cath removed this AM. Patient reports pain as moderate. Worked with physical therapy yesterday and ambulated 55'.  Objective: Vital signs in last 24 hours: Temp:  [97.3 F (36.3 C)-98.4 F (36.9 C)] 97.8 F (36.6 C) (12/21 0524) Pulse Rate:  [45-105] 87 (12/21 0524) Resp:  [10-18] 17 (12/21 0524) BP: (122-179)/(63-92) 154/77 (12/21 0524) SpO2:  [96 %-100 %] 100 % (12/21 0524) Weight:  [89.8 kg] 89.8 kg (12/20 0955)  Intake/Output from previous day:  Intake/Output Summary (Last 24 hours) at 08/31/2022 0721 Last data filed at 08/31/2022 0526 Gross per 24 hour  Intake 2027.5 ml  Output 2600 ml  Net -572.5 ml     Intake/Output this shift: No intake/output data recorded.  Labs: Recent Labs    08/31/22 0315  HGB 12.3   Recent Labs    08/31/22 0315  WBC 14.5*  RBC 4.10  HCT 38.3  PLT 211   Recent Labs    08/31/22 0315  NA 140  K 4.6  CL 110  CO2 24  BUN 17  CREATININE 0.94  GLUCOSE 122*  CALCIUM 9.0   No results for input(s): "LABPT", "INR" in the last 72 hours.  Exam: General - Patient is Alert and Oriented Extremity - Neurologically intact Neurovascular intact Sensation intact distally Dorsiflexion/Plantar flexion intact Dressing - dressing C/D/I Motor Function - intact, moving foot and toes well on exam.  Past Medical History:  Diagnosis Date   Anemia    pernicious   Anxiety    Arthritis    Asthma    Chronic diastolic CHF (congestive heart failure) ([place])    a. 02/2011 Echo: EF 55-60%, Gr2 DD, Mild MR   Chronic kidney disease    stage 3   Complication of anesthesia    Coronary artery disease    a. s/p CABG x 1 2010:  LIMA->LAD.;  b. amdx for CP => LHC 07/04/12: LAD 70-80%,  mid RCA 30%, LIMA-LAD patent with competitive flow limiting distal LAD filling, EF 70% with hyperdynamic LV function. Medical therapy continued.   Depression    Dyslipidemia    Dysrhythmia    hx of Afib   GERD (gastroesophageal reflux disease)    Headache    hx of migraines   Heart murmur    History of hiatal hernia    History of kidney stones    Hyperlipidemia    Hypertension    Hyperthyroidism    Hypothyroidism    Mild aortic stenosis    Mitral regurgitation    a. mild by echo 02/2011.   Pneumonia    PONV (postoperative nausea and vomiting)    Presence of permanent cardiac pacemaker    Sleep apnea    diagnosed 08-2022    Assessment/Plan: 1 Day Post-Op Procedure(s) (LRB): TOTAL HIP ARTHROPLASTY ANTERIOR APPROACH (Right) Principal Problem:   OA (osteoarthritis) of hip Active Problems:   Osteoarthritis of right hip  Estimated body mass index is 35.07 kg/m as calculated from the following:   Height as of this encounter: 5\' 3"  (1.6 m).   Weight as of this encounter: 89.8 kg. Advance diet Up with therapy D/C IV fluids  DVT Prophylaxis -  Eliquis Weight bearing as tolerated.  Continue physical therapy. Will require additional night in hospital to maximize  mobility and due to family sickness at home. Will do HEP once discharged.  Alfonzo Feller, PA-C Orthopedic Surgery [phone] 08/31/2022, 7:21 AM

## 2022-08-31 NOTE — Progress Notes (Signed)
Physical Therapy Treatment Patient Details Name: Samantha Short MRN: [id number] DOB: December 29, 1945 Today's Date: 08/31/2022   History of Present Illness 76 yo female s/p R THA-DA 12/20    PT Comments    Progressing with mobility. Moderate burning pain with activity. Will have a 2nd session today.    Recommendations for follow up therapy are one component of a multi-disciplinary discharge planning process, led by the attending physician.  Recommendations may be updated based on patient status, additional functional criteria and insurance authorization.  Follow Up Recommendations  Follow physician's recommendations for discharge plan and follow up therapies     Assistance Recommended at Discharge Intermittent Supervision/Assistance  Patient can return home with the following A little help with walking and/or transfers;A little help with bathing/dressing/bathroom;Assist for transportation;Assistance with cooking/housework;Help with stairs or ramp for entrance   Equipment Recommendations  Rolling walker (2 wheels)    Recommendations for Other Services       Precautions / Restrictions Precautions Precautions: Fall Restrictions Weight Bearing Restrictions: No Other Position/Activity Restrictions: WBAT     Mobility  Bed Mobility               General bed mobility comments: oob in recliner    Transfers Overall transfer level: Needs assistance Equipment used: Rolling walker (2 wheels) Transfers: Sit to/from Stand Sit to Stand: Min guard           General transfer comment: Min guard A for safety. Cues for hand placement.    Ambulation/Gait Ambulation/Gait assistance: Min guard Gait Distance (Feet): 75 Feet Assistive device: Rolling walker (2 wheels) Gait Pattern/deviations: Step-through pattern, Decreased stride length       General Gait Details: Min guard A for safety. Pt tolerated distance well. Gait is antagic-pt reports burning   Stairs              Wheelchair Mobility    Modified Rankin (Stroke Patients Only)       Balance Overall balance assessment: Needs assistance         Standing balance support: Bilateral upper extremity supported, Reliant on assistive device for balance, During functional activity Standing balance-Leahy Scale: Fair                              Cognition Arousal/Alertness: Awake/alert Behavior During Therapy: WFL for tasks assessed/performed Overall Cognitive Status: Within Functional Limits for tasks assessed                                          Exercises Total Joint Exercises Ankle Circles/Pumps: AROM, Both, 10 reps Quad Sets: AROM, Both, 10 reps Heel Slides: AAROM, Right, 5 reps, Seated Hip ABduction/ADduction: AAROM, Right, 10 reps, Seated Knee Flexion: AROM, Right, 10 reps, Standing Marching in Standing: AROM, Right, 10 reps, Standing    General Comments        Pertinent Vitals/Pain Pain Assessment Pain Assessment: 0-10 Pain Score: 7  Pain Location: R hip/thigh Pain Descriptors / Indicators: Burning, Tightness Pain Intervention(s): Monitored during session    Home Living                          Prior Function            PT Goals (current goals can now be found in the care plan section) Progress towards PT  goals: Progressing toward goals    Frequency    7X/week      PT Plan Current plan remains appropriate    Co-evaluation              AM-PAC PT "6 Clicks" Mobility   Outcome Measure  Help needed turning from your back to your side while in a flat bed without using bedrails?: A Little Help needed moving from lying on your back to sitting on the side of a flat bed without using bedrails?: A Little Help needed moving to and from a bed to a chair (including a wheelchair)?: A Little Help needed standing up from a chair using your arms (e.g., wheelchair or bedside chair)?: A Little Help needed to walk in hospital  room?: A Little Help needed climbing 3-5 steps with a railing? : A Little 6 Click Score: 18    End of Session Equipment Utilized During Treatment: Gait belt Activity Tolerance: Patient tolerated treatment well Patient left: in chair;with call bell/phone within reach   PT Visit Diagnosis: Other abnormalities of gait and mobility (R26.89)     Time: [id number] PT Time Calculation (min) (ACUTE ONLY): 17 min  Charges:  $Gait Training: 8-22 mins                         Doreatha Massed, PT Acute Rehabilitation  Office: [phone]

## 2022-08-31 NOTE — TOC Transition Note (Signed)
Transition of Care [facility]) - CM/SW Discharge Note   Patient Details  Name: Samantha Short MRN: [id number] Date of Birth: [birth year]  [facility] of [facility] CM/SW Contact:  Lennart Pall, LCSW Phone Number: 08/31/2022, 9:24 AM   Clinical Narrative:     Met with pt and confirming she has received RW via [employer] to room.  Plan for HEP.  No further TOC needs.  Final next level of care: Home/Self Care Barriers to Discharge: No Barriers Identified   Patient Goals and CMS Choice Patient states their goals for this hospitalization and ongoing recovery are:: return home        Discharge Placement                       Discharge Plan and Services                DME Arranged: Walker rolling DME Agency: [facility]                  Social Determinants of Health (SDOH) Interventions Utilities Interventions: AMB Referral   Readmission Risk Interventions     No data to display

## 2022-09-01 DIAGNOSIS — Z7901 Long term (current) use of anticoagulants: Secondary | ICD-10-CM | POA: Diagnosis not present

## 2022-09-01 DIAGNOSIS — I2510 Atherosclerotic heart disease of native coronary artery without angina pectoris: Secondary | ICD-10-CM | POA: Diagnosis not present

## 2022-09-01 DIAGNOSIS — Z96641 Presence of right artificial hip joint: Secondary | ICD-10-CM | POA: Diagnosis not present

## 2022-09-01 DIAGNOSIS — J45909 Unspecified asthma, uncomplicated: Secondary | ICD-10-CM | POA: Diagnosis not present

## 2022-09-01 DIAGNOSIS — Z950 Presence of cardiac pacemaker: Secondary | ICD-10-CM | POA: Diagnosis not present

## 2022-09-01 DIAGNOSIS — M1611 Unilateral primary osteoarthritis, right hip: Secondary | ICD-10-CM | POA: Diagnosis not present

## 2022-09-01 DIAGNOSIS — I130 Hypertensive heart and chronic kidney disease with heart failure and stage 1 through stage 4 chronic kidney disease, or unspecified chronic kidney disease: Secondary | ICD-10-CM | POA: Diagnosis not present

## 2022-09-01 DIAGNOSIS — N183 Chronic kidney disease, stage 3 unspecified: Secondary | ICD-10-CM | POA: Diagnosis not present

## 2022-09-01 DIAGNOSIS — Z87891 Personal history of nicotine dependence: Secondary | ICD-10-CM | POA: Diagnosis not present

## 2022-09-01 DIAGNOSIS — Z91040 Latex allergy status: Secondary | ICD-10-CM | POA: Diagnosis not present

## 2022-09-01 DIAGNOSIS — I5032 Chronic diastolic (congestive) heart failure: Secondary | ICD-10-CM | POA: Diagnosis not present

## 2022-09-01 DIAGNOSIS — Z951 Presence of aortocoronary bypass graft: Secondary | ICD-10-CM | POA: Diagnosis not present

## 2022-09-01 DIAGNOSIS — I4891 Unspecified atrial fibrillation: Secondary | ICD-10-CM | POA: Diagnosis not present

## 2022-09-01 DIAGNOSIS — E039 Hypothyroidism, unspecified: Secondary | ICD-10-CM | POA: Diagnosis not present

## 2022-09-01 DIAGNOSIS — Z79899 Other long term (current) drug therapy: Secondary | ICD-10-CM | POA: Diagnosis not present

## 2022-09-01 LAB — CBC
HCT: 39.4 % (ref 36.0–46.0)
Hemoglobin: 12.6 g/dL (ref 12.0–15.0)
MCH: 30 pg (ref 26.0–34.0)
MCHC: 32 g/dL (ref 30.0–36.0)
MCV: 93.8 fL (ref 80.0–100.0)
Platelets: 196 10*3/uL (ref 150–400)
RBC: 4.2 MIL/uL (ref 3.87–5.11)
RDW: 13.3 % (ref 11.5–15.5)
WBC: 16.8 10*3/uL — ABNORMAL HIGH (ref 4.0–10.5)
nRBC: 0 % (ref 0.0–0.2)

## 2022-09-01 MED ORDER — METHOCARBAMOL 500 MG PO TABS: 500.0000 mg | ORAL_TABLET | Freq: Four times a day (QID) | ORAL | 0 refills | Status: DC | PRN

## 2022-09-01 MED ORDER — TRAMADOL HCL 50 MG PO TABS: 50.0000 mg | ORAL_TABLET | Freq: Four times a day (QID) | ORAL | 0 refills | Status: DC | PRN

## 2022-09-01 MED ORDER — HYDROCODONE-ACETAMINOPHEN 5-325 MG PO TABS: 1.0000 | ORAL_TABLET | Freq: Four times a day (QID) | ORAL | 0 refills | Status: DC | PRN

## 2022-09-01 NOTE — Progress Notes (Signed)
Subjective: 2 Days Post-Op Procedure(s) (LRB): TOTAL HIP ARTHROPLASTY ANTERIOR APPROACH (Right) Patient reports pain as mild.   Patient seen in rounds by Dr. Wynelle Link. Patient is well, and has had no acute complaints or problems. States she is ready to go home. No issues overnight, did well with PT yesterday.  Plan is to go Home after hospital stay.  Objective: Vital signs in last 24 hours: Temp:  [97.8 F (36.6 C)-98.4 F (36.9 C)] 98.4 F (36.9 C) (12/22 0535) Pulse Rate:  [78-91] 78 (12/22 0535) Resp:  [16-20] 16 (12/22 0535) BP: (149-177)/(71-79) 149/71 (12/22 0535) SpO2:  [98 %-100 %] 98 % (12/22 0535)  Intake/Output from previous day:  Intake/Output Summary (Last 24 hours) at 09/01/2022 0727 Last data filed at 09/01/2022 0600 Gross per 24 hour  Intake 919.93 ml  Output 2325 ml  Net -1405.07 ml    Intake/Output this shift: No intake/output data recorded.  Labs: Recent Labs    08/31/22 0315 09/01/22 0329  HGB 12.3 12.6   Recent Labs    08/31/22 0315 09/01/22 0329  WBC 14.5* 16.8*  RBC 4.10 4.20  HCT 38.3 39.4  PLT 211 196   Recent Labs    08/31/22 0315  NA 140  K 4.6  CL 110  CO2 24  BUN 17  CREATININE 0.94  GLUCOSE 122*  CALCIUM 9.0   No results for input(s): "LABPT", "INR" in the last 72 hours.  Exam: General - Patient is Alert and Oriented Extremity - Neurologically intact Neurovascular intact Sensation intact distally Dorsiflexion/Plantar flexion intact Dressing/Incision - clean, dry, no drainage Motor Function - intact, moving foot and toes well on exam.   Past Medical History:  Diagnosis Date   Anemia    pernicious   Anxiety    Arthritis    Asthma    Chronic diastolic CHF (congestive heart failure) ([place])    a. 02/2011 Echo: EF 55-60%, Gr2 DD, Mild MR   Chronic kidney disease    stage 3   Complication of anesthesia    Coronary artery disease    a. s/p CABG x 1 2010:  LIMA->LAD.;  b. amdx for CP => LHC 07/04/12: LAD 70-80%,  mid RCA 30%, LIMA-LAD patent with competitive flow limiting distal LAD filling, EF 70% with hyperdynamic LV function. Medical therapy continued.   Depression    Dyslipidemia    Dysrhythmia    hx of Afib   GERD (gastroesophageal reflux disease)    Headache    hx of migraines   Heart murmur    History of hiatal hernia    History of kidney stones    Hyperlipidemia    Hypertension    Hyperthyroidism    Hypothyroidism    Mild aortic stenosis    Mitral regurgitation    a. mild by echo 02/2011.   Pneumonia    PONV (postoperative nausea and vomiting)    Presence of permanent cardiac pacemaker    Sleep apnea    diagnosed 08-2022    Assessment/Plan: 2 Days Post-Op Procedure(s) (LRB): TOTAL HIP ARTHROPLASTY ANTERIOR APPROACH (Right) Principal Problem:   OA (osteoarthritis) of hip Active Problems:   Osteoarthritis of right hip  Estimated body mass index is 35.07 kg/m as calculated from the following:   Height as of this encounter: 5\' 3"  (1.6 m).   Weight as of this encounter: 89.8 kg. Up with therapy D/C IV fluids  DVT Prophylaxis -  Eliquis Weight-bearing as tolerated  Discharge to home today with HEP if cleared with  PT. Follow-up in the office in 2 weeks.  The PDMP database was reviewed today prior to any opioid medications being prescribed to this patient.  Arther Abbott, PA-C Orthopedic Surgery [phone] 09/01/2022, 7:27 AM

## 2022-09-01 NOTE — Plan of Care (Signed)
  Problem: Education: Goal: Knowledge of General Education information will improve Description: Including pain rating scale, medication(s)/side effects and non-pharmacologic comfort measures Outcome: Progressing   Problem: Health Behavior/Discharge Planning: Goal: Ability to manage health-related needs will improve Outcome: Progressing   Problem: Pain Managment: Goal: General experience of comfort will improve Outcome: Progressing   

## 2022-09-01 NOTE — Progress Notes (Signed)
The patient is alert and oriented and has been seen by her physician. The orders for discharge were written. IV has been removed. Went over discharge instructions with patient. She is being discharged via wheelchair with all of her belongings.   

## 2022-09-02 NOTE — Discharge Summary (Signed)
Patient ID: JAUNICE KUNZE MRN: [id number] DOB/AGE: 04-23-1946 76 y.o.  Admit date: 08/30/2022 Discharge date: 09/01/2022  Admission Diagnoses:  Principal Problem:   OA (osteoarthritis) of hip Active Problems:   Osteoarthritis of right hip   Discharge Diagnoses:  Same  Past Medical History:  Diagnosis Date   Anemia    pernicious   Anxiety    Arthritis    Asthma    Chronic diastolic CHF (congestive heart failure) ([place])    a. 02/2011 Echo: EF 55-60%, Gr2 DD, Mild MR   Chronic kidney disease    stage 3   Complication of anesthesia    Coronary artery disease    a. s/p CABG x 1 2010:  LIMA->LAD.;  b. amdx for CP => LHC 07/04/12: LAD 70-80%, mid RCA 30%, LIMA-LAD patent with competitive flow limiting distal LAD filling, EF 70% with hyperdynamic LV function. Medical therapy continued.   Depression    Dyslipidemia    Dysrhythmia    hx of Afib   GERD (gastroesophageal reflux disease)    Headache    hx of migraines   Heart murmur    History of hiatal hernia    History of kidney stones    Hyperlipidemia    Hypertension    Hyperthyroidism    Hypothyroidism    Mild aortic stenosis    Mitral regurgitation    a. mild by echo 02/2011.   Pneumonia    PONV (postoperative nausea and vomiting)    Presence of permanent cardiac pacemaker    Sleep apnea    diagnosed 08-2022    Surgeries: Procedure(s): TOTAL HIP ARTHROPLASTY ANTERIOR APPROACH on 08/30/2022   Consultants:   Discharged Condition: Improved  Hospital Course: RAKHIA STEYER is an 76 y.o. female who was admitted 08/30/2022 for operative treatment ofOA (osteoarthritis) of hip. Patient has severe unremitting pain that affects sleep, daily activities, and work/hobbies. After pre-op clearance the patient was taken to the operating room on 08/30/2022 and underwent  Procedure(s): TOTAL HIP ARTHROPLASTY ANTERIOR APPROACH.    Patient was given perioperative antibiotics:  Anti-infectives (From admission, onward)    Start      Dose/Rate Route Frequency Ordered Stop   08/30/22 2200  vancomycin (VANCOCIN) IVPB 1000 mg/200 mL premix        1,000 mg 200 mL/hr over 60 Minutes Intravenous Every 12 hours 08/30/22 1548 08/30/22 2325   08/30/22 0930  vancomycin (VANCOCIN) IVPB 1000 mg/200 mL premix        1,000 mg 200 mL/hr over 60 Minutes Intravenous On call to O.R. 08/30/22 0916 08/30/22 1118        Patient was given sequential compression devices, early ambulation, and chemoprophylaxis to prevent DVT.  Patient benefited maximally from hospital stay and there were no complications.    Recent vital signs: No data found.   Recent laboratory studies:  Recent Labs    08/31/22 0315 09/01/22 0329  WBC 14.5* 16.8*  HGB 12.3 12.6  HCT 38.3 39.4  PLT 211 196  NA 140  --   K 4.6  --   CL 110  --   CO2 24  --   BUN 17  --   CREATININE 0.94  --   GLUCOSE 122*  --   CALCIUM 9.0  --      Discharge Medications:   Allergies as of 09/01/2022       Reactions   Ceclor [cefaclor] Other (See Comments)   Lost vision in eye   Hctz [hydrochlorothiazide] Other (See Comments)  Renal insufficiency   Lipitor [atorvastatin Calcium] Other (See Comments)   Increased A1C   Norvasc [amlodipine] Other (See Comments)   Makes patient stiff   Penicillins Hives, Shortness Of Breath, Itching, Rash   Has patient had a PCN reaction causing immediate rash, facial/tongue/throat swelling, SOB or lightheadedness with hypotension: Yes Has patient had a PCN reaction causing severe rash involving mucus membranes or skin necrosis: Unk Has patient had a PCN reaction that required hospitalization: No Has patient had a PCN reaction occurring within the last 10 years: No If all of the above answers are "NO", then may proceed with Cephalosporin use.   Sulfa Antibiotics Other (See Comments)   CAUSES SHOCK   Statins Other (See Comments)   MYALGIAS AND WEAKNESS   Milk-related Compounds Diarrhea, Nausea And Vomiting   Erythromycin Rash    Latex Rash   Levofloxacin Rash   Tetracyclines & Related Rash        Medication List     STOP taking these medications    diclofenac Sodium 1 % Gel Commonly known as: VOLTAREN       TAKE these medications    acetaminophen 500 MG tablet Commonly known as: TYLENOL Take 500 mg by mouth every 6 (six) hours as needed for moderate pain.   apixaban 5 MG Tabs tablet Commonly known as: ELIQUIS Take 1 tablet (5 mg total) by mouth 2 (two) times daily.   cholecalciferol 25 MCG (1000 UNIT) tablet Commonly known as: VITAMIN D3 Take 1,000 Units by mouth daily.   cyanocobalamin 1000 MCG/ML injection Commonly known as: VITAMIN B12 Inject 1,000 mcg into the muscle every 30 (thirty) days.   diltiazem 120 MG 24 hr capsule Commonly known as: Cardizem CD Take 1 capsule (120 mg total) by mouth daily.   FISH OIL PO Take 1 capsule by mouth daily.   fluticasone 50 MCG/ACT nasal spray Commonly known as: FLONASE Place 2 sprays into both nostrils daily as needed for allergies or rhinitis.   furosemide 20 MG tablet Commonly known as: LASIX Take 1 tablet by mouth daily.   HYDROcodone-acetaminophen 5-325 MG tablet Commonly known as: NORCO/VICODIN Take 1-2 tablets by mouth every 6 (six) hours as needed for severe pain.   isosorbide mononitrate 30 MG 24 hr tablet Commonly known as: IMDUR TAKE 1 TABLET AT BEDTIME. PLEASE KEEP UPCOMING APPOINTMENT FOR FUTURE REFILLS.   levothyroxine 50 MCG tablet Commonly known as: SYNTHROID Take 50 mcg by mouth every morning.   loratadine 10 MG tablet Commonly known as: CLARITIN Take 10 mg by mouth daily.   methocarbamol 500 MG tablet Commonly known as: ROBAXIN Take 1 tablet (500 mg total) by mouth every 6 (six) hours as needed for muscle spasms.   multivitamin with minerals Tabs tablet Take 1 tablet by mouth daily.   nitroGLYCERIN 0.4 MG SL tablet Commonly known as: NITROSTAT PLACE 1 TABLET UNDER THE TONGUE EVERY 5 MINUTES AS NEEDED FOR  CHEST PAIN   PreserVision AREDS 2 Caps Take 1 capsule by mouth 2 (two) times daily.   traMADol 50 MG tablet Commonly known as: ULTRAM Take 1-2 tablets (50-100 mg total) by mouth every 6 (six) hours as needed for moderate pain.   traZODone 50 MG tablet Commonly known as: DESYREL Take 25 mg by mouth at bedtime.               Discharge Care Instructions  (From admission, onward)           Start     Ordered  09/01/22 0000  Weight bearing as tolerated        09/01/22 0731   09/01/22 0000  Change dressing       Comments: You have an adhesive waterproof bandage over the incision. Leave this in place until your first follow-up appointment. Once you remove this you will not need to place another bandage.   09/01/22 0731            Diagnostic Studies: DG Pelvis Portable  Result Date: 08/30/2022 CLINICAL DATA:  Status post total right hip arthroplasty. EXAM: PORTABLE PELVIS 1-2 VIEWS COMPARISON:  Right hip radiographs 02/08/2022 FINDINGS: Interval total right hip arthroplasty. No perihardware lucency is seen to indicate hardware failure or loosening. Expected postoperative changes including lateral hip and thigh subcutaneous air. Mild superomedial left femoroacetabular joint space narrowing and peripheral osteophytosis. No acute fracture or dislocation. IMPRESSION: Interval total right hip arthroplasty without evidence of hardware failure. Electronically Signed   By: Neita Garnet M.D.   On: 08/30/2022 13:44   DG HIP UNILAT WITH PELVIS 1V RIGHT  Result Date: 08/30/2022 CLINICAL DATA:  Right hip surgery. EXAM: DG HIP (WITH OR WITHOUT PELVIS) 1V RIGHT COMPARISON:  Right hip radiographs 02/08/2022 FINDINGS: Images were performed intraoperatively without the presence of a radiologist. Interval total right hip arthroplasty. No hardware complication is seen. Total fluoroscopy images: 2 Total fluoroscopy time: 10 seconds Total dose: Radiation Exposure Index (as provided by the  fluoroscopic device): 1.597 mGy air Kerma Please see intraoperative findings for further detail. IMPRESSION: Intraoperative images during total right hip arthroplasty. Electronically Signed   By: Neita Garnet M.D.   On: 08/30/2022 13:06   DG C-Arm 1-60 Min-No Report  Result Date: 08/30/2022 Fluoroscopy was utilized by the requesting physician.  No radiographic interpretation.   SLEEP STUDY DOCUMENTS  Result Date: 08/25/2022 Ordered by an unspecified provider.   Disposition: Discharge disposition: 01-Home or Self Care       Discharge Instructions     Call MD / Call 911   Complete by: As directed    If you experience chest pain or shortness of breath, CALL 911 and be transported to the hospital emergency room.  If you develope a fever above 101 F, pus (white drainage) or increased drainage or redness at the wound, or calf pain, call your surgeon's office.   Change dressing   Complete by: As directed    You have an adhesive waterproof bandage over the incision. Leave this in place until your first follow-up appointment. Once you remove this you will not need to place another bandage.   Constipation Prevention   Complete by: As directed    Drink plenty of fluids.  Prune juice may be helpful.  You may use a stool softener, such as Colace (over the counter) 100 mg twice a day.  Use MiraLax (over the counter) for constipation as needed.   Diet - low sodium heart healthy   Complete by: As directed    Do not sit on low chairs, stoools or toilet seats, as it may be difficult to get up from low surfaces   Complete by: As directed    Driving restrictions   Complete by: As directed    No driving for two weeks   Post-operative opioid taper instructions:   Complete by: As directed    POST-OPERATIVE OPIOID TAPER INSTRUCTIONS: It is important to wean off of your opioid medication as soon as possible. If you do not need pain medication after your surgery it is ok  to stop day one. Opioids  include: Codeine, Hydrocodone(Norco, Vicodin), Oxycodone(Percocet, oxycontin) and hydromorphone amongst others.  Long term and even short term use of opiods can cause: Increased pain response Dependence Constipation Depression Respiratory depression And more.  Withdrawal symptoms can include Flu like symptoms Nausea, vomiting And more Techniques to manage these symptoms Hydrate well Eat regular healthy meals Stay active Use relaxation techniques(deep breathing, meditating, yoga) Do Not substitute Alcohol to help with tapering If you have been on opioids for less than two weeks and do not have pain than it is ok to stop all together.  Plan to wean off of opioids This plan should start within one week post op of your joint replacement. Maintain the same interval or time between taking each dose and first decrease the dose.  Cut the total daily intake of opioids by one tablet each day Next start to increase the time between doses. The last dose that should be eliminated is the evening dose.      TED hose   Complete by: As directed    Use stockings (TED hose) for three weeks on both leg(s).  You may remove them at night for sleeping.   Weight bearing as tolerated   Complete by: As directed         Follow-up Information     Gaynelle Arabian, MD. Go on 09/12/2022.   Specialty: Orthopedic Surgery Why: You are scheduled for first post op appt on Tuesday Jan 2 at 2:15pm. Contact information: [address] [place] [address] [id number]                  Signed: Theresa Duty 09/02/2022, 9:59 AM

## 2022-09-05 ENCOUNTER — Telehealth: Payer: Self-pay | Admitting: *Deleted

## 2022-09-05 DIAGNOSIS — I2510 Atherosclerotic heart disease of native coronary artery without angina pectoris: Secondary | ICD-10-CM

## 2022-09-05 DIAGNOSIS — G4733 Obstructive sleep apnea (adult) (pediatric): Secondary | ICD-10-CM

## 2022-09-05 DIAGNOSIS — I5032 Chronic diastolic (congestive) heart failure: Secondary | ICD-10-CM

## 2022-09-05 NOTE — Telephone Encounter (Signed)
The patient has been notified of the result and verbalized understanding.  All questions (if any) were answered. Samantha Short, CMA 09/05/2022 3:25 PM   Patient had hip replacement surgery recently and will put this test off until she is able to do it.

## 2022-09-05 NOTE — Telephone Encounter (Signed)
-----   Message from Gaynelle Cage, CMA sent at 08/21/2022  8:17 AM EST -----  ----- Message ----- From: Quintella Reichert, MD Sent: 08/19/2022   1:00 PM EST To: Cv Div Sleep Studies  Please let patient know that they have sleep apnea.  Recommend therapeutic CPAP titration for treatment of patient's sleep disordered breathing.  If unable to perform an in lab titration then initiate ResMed auto CPAP from 4 to 15cm H2O with heated humidity and mask of choice and overnight pulse ox on CPAP.

## 2022-09-14 ENCOUNTER — Ambulatory Visit (INDEPENDENT_AMBULATORY_CARE_PROVIDER_SITE_OTHER): Payer: Medicare HMO

## 2022-09-14 DIAGNOSIS — I441 Atrioventricular block, second degree: Secondary | ICD-10-CM

## 2022-09-14 LAB — CUP PACEART REMOTE DEVICE CHECK
Battery Remaining Longevity: 139 mo
Battery Voltage: 3.01 V
Brady Statistic AP VP Percent: 0.37 %
Brady Statistic AP VS Percent: 3.16 %
Brady Statistic AS VP Percent: 30.54 %
Brady Statistic AS VS Percent: 65.93 %
Brady Statistic RA Percent Paced: 3.56 %
Brady Statistic RV Percent Paced: 30.91 %
Date Time Interrogation Session: 20240104081424
Implantable Lead Connection Status: 753985
Implantable Lead Connection Status: 753985
Implantable Lead Implant Date: 20210707
Implantable Lead Implant Date: 20210707
Implantable Lead Location: 753859
Implantable Lead Location: 753860
Implantable Lead Model: 5076
Implantable Lead Model: 5076
Implantable Lead Serial Number: 8264443
Implantable Pulse Generator Implant Date: 20210707
Lead Channel Impedance Value: 323 Ohm
Lead Channel Impedance Value: 361 Ohm
Lead Channel Impedance Value: 399 Ohm
Lead Channel Impedance Value: 437 Ohm
Lead Channel Pacing Threshold Amplitude: 0.625 V
Lead Channel Pacing Threshold Amplitude: 0.875 V
Lead Channel Pacing Threshold Pulse Width: 0.4 ms
Lead Channel Pacing Threshold Pulse Width: 0.4 ms
Lead Channel Sensing Intrinsic Amplitude: 3.625 mV
Lead Channel Sensing Intrinsic Amplitude: 3.625 mV
Lead Channel Sensing Intrinsic Amplitude: 6.75 mV
Lead Channel Sensing Intrinsic Amplitude: 6.75 mV
Lead Channel Setting Pacing Amplitude: 1.5 V
Lead Channel Setting Pacing Amplitude: 2.5 V
Lead Channel Setting Pacing Pulse Width: 0.4 ms
Lead Channel Setting Sensing Sensitivity: 2.8 mV
Zone Setting Status: 755011
Zone Setting Status: 755011

## 2022-09-26 DIAGNOSIS — E538 Deficiency of other specified B group vitamins: Secondary | ICD-10-CM | POA: Diagnosis not present

## 2022-09-27 NOTE — Addendum Note (Signed)
Addended by: Freada Bergeron on: 09/27/2022 12:48 PM   Modules accepted: Orders

## 2022-10-04 DIAGNOSIS — R7303 Prediabetes: Secondary | ICD-10-CM | POA: Diagnosis not present

## 2022-10-04 DIAGNOSIS — N183 Chronic kidney disease, stage 3 unspecified: Secondary | ICD-10-CM | POA: Diagnosis not present

## 2022-10-04 DIAGNOSIS — R69 Illness, unspecified: Secondary | ICD-10-CM | POA: Diagnosis not present

## 2022-10-04 DIAGNOSIS — E538 Deficiency of other specified B group vitamins: Secondary | ICD-10-CM | POA: Diagnosis not present

## 2022-10-04 DIAGNOSIS — G479 Sleep disorder, unspecified: Secondary | ICD-10-CM | POA: Diagnosis not present

## 2022-10-04 DIAGNOSIS — D510 Vitamin B12 deficiency anemia due to intrinsic factor deficiency: Secondary | ICD-10-CM | POA: Diagnosis not present

## 2022-10-04 DIAGNOSIS — R32 Unspecified urinary incontinence: Secondary | ICD-10-CM | POA: Diagnosis not present

## 2022-10-05 NOTE — Progress Notes (Signed)
Remote pacemaker transmission.   

## 2022-10-10 DIAGNOSIS — Z5189 Encounter for other specified aftercare: Secondary | ICD-10-CM | POA: Diagnosis not present

## 2022-10-11 ENCOUNTER — Telehealth: Payer: Self-pay | Admitting: Cardiovascular Disease

## 2022-10-11 NOTE — Telephone Encounter (Signed)
Patient is follow-up on getting the second part of her sleep study ordered.  Patient stated her [employer] has not received paperwork for this test.

## 2022-10-11 NOTE — Telephone Encounter (Signed)
Send message to patients mychart.

## 2022-10-17 ENCOUNTER — Ambulatory Visit (HOSPITAL_BASED_OUTPATIENT_CLINIC_OR_DEPARTMENT_OTHER): Payer: Medicare HMO | Attending: Cardiology | Admitting: Cardiology

## 2022-10-17 VITALS — Ht 63.0 in | Wt 198.0 lb

## 2022-10-17 DIAGNOSIS — I2510 Atherosclerotic heart disease of native coronary artery without angina pectoris: Secondary | ICD-10-CM | POA: Insufficient documentation

## 2022-10-17 DIAGNOSIS — G4733 Obstructive sleep apnea (adult) (pediatric): Secondary | ICD-10-CM | POA: Insufficient documentation

## 2022-10-17 DIAGNOSIS — I5032 Chronic diastolic (congestive) heart failure: Secondary | ICD-10-CM | POA: Insufficient documentation

## 2022-10-18 NOTE — Procedures (Signed)
   Patient Name: Samantha Short, Samantha Short Date: 10/17/2022 Gender: Female D.O.B: 04/08/1946 Age (years): 76 Referring Provider: Virl Axe Height (inches): 67 Interpreting Physician: Fransico Him MD, ABSM Weight (lbs): 198 RPSGT: Carolin Coy BMI: 35 MRN: [id number] Neck Size: 13.00  CLINICAL INFORMATION The patient is referred for a BiPAP titration to treat sleep apnea.  SLEEP STUDY TECHNIQUE As per the AASM Manual for the Scoring of Sleep and Associated Events v2.3 (April 2016) with a hypopnea requiring 4% desaturations.  The channels recorded and monitored were frontal, central and occipital EEG, electrooculogram (EOG), submentalis EMG (chin), nasal and oral airflow, thoracic and abdominal wall motion, anterior tibialis EMG, snore microphone, electrocardiogram, and pulse oximetry. Bilevel positive airway pressure (BPAP) was initiated at the beginning of the study and titrated to treat sleep-disordered breathing.  MEDICATIONS Medications self-administered by patient taken the night of the study : ARE OTC EYE DROP, ELIQUIS, ISOSORBIDE MONONITRATE, TRAZODONE, VITAMIN D3  RESPIRATORY PARAMETERS Optimal IPAP Pressure (cm):22  AHI at Optimal Pressure (/hr) 0 Optimal EPAP Pressure (cm):18  Overall Minimal O2 (%):81.0  Minimal O2 at Optimal Pressure (%): 94.0  SLEEP ARCHITECTURE Start Time:10:53:48 PM  Stop Time: 5:07:22 AM  Total Time (min):373.6  Total Sleep Time (min):201 Sleep Latency (min):18.9  Sleep Efficiency (%):53.8%  REM Latency (min):122.5  WASO (min): 153.7 Stage N1 (%):10.7%  Stage N2 (%):70.4%  Stage N3 (%):0.0%  Stage R (%):18.9 Supine (%):64.43  Arousal Index (/hr):27.2   CARDIAC DATA The 2 lead EKG demonstrated sinus rhythm. The mean heart rate was 76.3 beats per minute. Other EKG findings include: None.  LEG MOVEMENT DATA The total Periodic Limb Movements of Sleep (PLMS) were 0. The PLMS index was 0.0. A PLMS index of <15 is considered normal in  adults.  IMPRESSIONS - An optimal PAP pressure was selected for this patient ( 22 / cm of water) - Central sleep apnea was not noted during this titration (CAI = 0.3/h). - Moderete oxygen desaturations were observed during this titration (min O2 = 81.0%). - The patient snored with moderate snoring volume. - No cardiac abnormalities were observed during this study. - Clinically significant periodic limb movements were not noted during this study. Arousals associated with PLMs were rare.  DIAGNOSIS - Obstructive Sleep Apnea (G47.33)  RECOMMENDATIONS - Trial of ResMed BiPAP therapy on 20/16cm H2O with a Medium size Fisher[employer]Paykel Full Face Simplus mask and heated humidification. - Avoid alcohol, sedatives and other CNS depressants that may worsen sleep apnea and disrupt normal sleep architecture. - Sleep hygiene should be reviewed to assess factors that may improve sleep quality. - Weight management and regular exercise should be initiated or continued. - Return to Sleep Center for re-evaluation after 4-6 weeks of therapy  [Electronically signed] 10/18/2022 01:29 PM  Fransico Him MD, ABSM Diplomate, American Board of Sleep Medicine

## 2022-10-30 ENCOUNTER — Telehealth: Payer: Self-pay | Admitting: *Deleted

## 2022-10-30 DIAGNOSIS — I10 Essential (primary) hypertension: Secondary | ICD-10-CM

## 2022-10-30 DIAGNOSIS — I2510 Atherosclerotic heart disease of native coronary artery without angina pectoris: Secondary | ICD-10-CM

## 2022-10-30 DIAGNOSIS — G4733 Obstructive sleep apnea (adult) (pediatric): Secondary | ICD-10-CM

## 2022-10-30 NOTE — Telephone Encounter (Signed)
The patient has been notified of the result and verbalized understanding.  All questions (if any) were answered. Marolyn Hammock, [place] 10/30/2022 10:36 AM    Upon patient request DME selection is [facility] Patient understands he will be contacted by [facility] to set up his cpap. Patient understands to call if [facility] does not contact him with new setup in a timely manner. Patient understands they will be called once confirmation has been received from [facility] that they have received their new machine to schedule 10 week follow up appointment.   [facility] notified of new cpap order  Please add to airview Patient was grateful for the call and thanked me.

## 2022-10-30 NOTE — Telephone Encounter (Signed)
-----   Message from Lauralee Evener, [place] sent at 10/19/2022 10:07 AM EST -----  ----- Message ----- From: Sueanne Margarita, MD Sent: 10/18/2022   1:31 PM EST To: Cv Div Sleep Studies  Please let patient know that they had a successful PAP titration and let DME know that orders are in EPIC.  Please set up 6 week OV with me.

## 2022-11-01 DIAGNOSIS — E538 Deficiency of other specified B group vitamins: Secondary | ICD-10-CM | POA: Diagnosis not present

## 2022-11-06 ENCOUNTER — Encounter: Payer: Self-pay | Admitting: Internal Medicine

## 2022-11-14 NOTE — Telephone Encounter (Signed)
Patient called to ask to have her Hst and her In Lab test put in her Mychart because she could not see them. Per the patient they have been put in her mychart.

## 2022-11-16 ENCOUNTER — Telehealth: Payer: Self-pay | Admitting: Cardiovascular Disease

## 2022-11-16 NOTE — Telephone Encounter (Signed)
Called patient.  She has not slept for 3 nights.   Was outside to talk to neighbor and stepped off 2 small steps on front porch, felt ready to pass out, her neighbor came over and held her up. She became weak and hot.  Resolved after about 30 seconds.  Anytime she stands up gets lightheaded today.  Has not checked BP today. Its up inside a cabinet - will ask her daughter to get it down later.  She feels fine when first gets moving in the mornings, then she starts to get lightheaded after taking morning Eliquis, furosemide, diltiazem and an allergy pill.  Feels like she is staying hydrated.     Goes to bed and around 10 pm and awake at 1:00 am and then cannot sleep any more.  The company that is supposed to be bringing her cpap/supplies is coming on Tuesday.  She wants to know from Dr. Caryl Comes if she can go back on melatonin which always helped her sleep.  She is taking trazadone 50 mg nightly now, prescribed by PCP. --this was increased from 25 mg within the last 2 weeks.   It helps her go to sleep but not stay asleep.    Also wants to know if this dizziness can be caused from the morning medications.  She takes IMDUR at bedtime.  We have no recent BP readings.

## 2022-11-16 NOTE — Telephone Encounter (Signed)
Please Inform Patient Her dilt can make dizziness an issue and see can take in pm She can resume melatonin if she wants She can also followup with PCP Thanks

## 2022-11-16 NOTE — Telephone Encounter (Signed)
Pt would like a callback regarding not being able to sleep the last 3 nights, as well as almost passed out today. Pt thinks that one of her medications may be the cause of this. Please advise.

## 2022-11-17 NOTE — Telephone Encounter (Signed)
Spoke with patient to advise per Dr Caryl Comes to take her diltiazem in the evening and resume melatonin if she wants. Follow up with PCP.  Patient verbalized understanding and had no questions.

## 2022-11-21 DIAGNOSIS — G4733 Obstructive sleep apnea (adult) (pediatric): Secondary | ICD-10-CM | POA: Diagnosis not present

## 2022-11-27 DIAGNOSIS — E538 Deficiency of other specified B group vitamins: Secondary | ICD-10-CM | POA: Diagnosis not present

## 2022-12-05 DIAGNOSIS — G4733 Obstructive sleep apnea (adult) (pediatric): Secondary | ICD-10-CM | POA: Diagnosis not present

## 2022-12-05 DIAGNOSIS — I10 Essential (primary) hypertension: Secondary | ICD-10-CM | POA: Diagnosis not present

## 2022-12-05 DIAGNOSIS — E669 Obesity, unspecified: Secondary | ICD-10-CM | POA: Diagnosis not present

## 2022-12-14 ENCOUNTER — Ambulatory Visit: Payer: Medicare HMO | Admitting: Cardiology

## 2022-12-15 ENCOUNTER — Ambulatory Visit (INDEPENDENT_AMBULATORY_CARE_PROVIDER_SITE_OTHER): Payer: Medicare HMO

## 2022-12-15 DIAGNOSIS — I441 Atrioventricular block, second degree: Secondary | ICD-10-CM | POA: Diagnosis not present

## 2022-12-15 LAB — CUP PACEART REMOTE DEVICE CHECK
Battery Remaining Longevity: 137 mo
Battery Voltage: 3.02 V
Brady Statistic AP VP Percent: 0.09 %
Brady Statistic AP VS Percent: 0.76 %
Brady Statistic AS VP Percent: 6.67 %
Brady Statistic AS VS Percent: 92.46 %
Brady Statistic RA Percent Paced: 0.86 %
Brady Statistic RV Percent Paced: 8.72 %
Date Time Interrogation Session: 20240405015821
Implantable Lead Connection Status: 753985
Implantable Lead Connection Status: 753985
Implantable Lead Implant Date: 20210707
Implantable Lead Implant Date: 20210707
Implantable Lead Location: 753859
Implantable Lead Location: 753860
Implantable Lead Model: 5076
Implantable Lead Model: 5076
Implantable Lead Serial Number: 8264443
Implantable Pulse Generator Implant Date: 20210707
Lead Channel Impedance Value: 285 Ohm
Lead Channel Impedance Value: 342 Ohm
Lead Channel Impedance Value: 399 Ohm
Lead Channel Impedance Value: 494 Ohm
Lead Channel Pacing Threshold Amplitude: 0.875 V
Lead Channel Pacing Threshold Amplitude: 0.875 V
Lead Channel Pacing Threshold Pulse Width: 0.4 ms
Lead Channel Pacing Threshold Pulse Width: 0.4 ms
Lead Channel Sensing Intrinsic Amplitude: 3.625 mV
Lead Channel Sensing Intrinsic Amplitude: 3.625 mV
Lead Channel Sensing Intrinsic Amplitude: 5.5 mV
Lead Channel Sensing Intrinsic Amplitude: 5.5 mV
Lead Channel Setting Pacing Amplitude: 1.75 V
Lead Channel Setting Pacing Amplitude: 2.5 V
Lead Channel Setting Pacing Pulse Width: 0.4 ms
Lead Channel Setting Sensing Sensitivity: 2.8 mV
Zone Setting Status: 755011
Zone Setting Status: 755011

## 2022-12-22 DIAGNOSIS — G4733 Obstructive sleep apnea (adult) (pediatric): Secondary | ICD-10-CM | POA: Diagnosis not present

## 2022-12-25 DIAGNOSIS — E538 Deficiency of other specified B group vitamins: Secondary | ICD-10-CM | POA: Diagnosis not present

## 2023-01-02 ENCOUNTER — Encounter (HOSPITAL_COMMUNITY): Payer: Self-pay

## 2023-01-02 ENCOUNTER — Ambulatory Visit
Admission: EM | Admit: 2023-01-02 | Discharge: 2023-01-02 | Disposition: A | Discharge status: Other Acute Inpatient Hospital | Payer: Medicare HMO | Attending: Physician Assistant | Admitting: Physician Assistant

## 2023-01-02 ENCOUNTER — Inpatient Hospital Stay (HOSPITAL_COMMUNITY)
Admission: EM | Admit: 2023-01-02 | Discharge: 2023-01-05 | DRG: 308 | Disposition: A | Discharge status: Home / Self Care | Payer: Medicare HMO | Attending: Internal Medicine | Admitting: Internal Medicine

## 2023-01-02 ENCOUNTER — Emergency Department (HOSPITAL_COMMUNITY): Payer: Medicare HMO

## 2023-01-02 ENCOUNTER — Encounter: Payer: Self-pay | Admitting: Emergency Medicine

## 2023-01-02 ENCOUNTER — Other Ambulatory Visit: Payer: Self-pay

## 2023-01-02 DIAGNOSIS — Z87891 Personal history of nicotine dependence: Secondary | ICD-10-CM

## 2023-01-02 DIAGNOSIS — R0602 Shortness of breath: Secondary | ICD-10-CM

## 2023-01-02 DIAGNOSIS — Z79890 Hormone replacement therapy (postmenopausal): Secondary | ICD-10-CM

## 2023-01-02 DIAGNOSIS — I130 Hypertensive heart and chronic kidney disease with heart failure and stage 1 through stage 4 chronic kidney disease, or unspecified chronic kidney disease: Secondary | ICD-10-CM | POA: Diagnosis present

## 2023-01-02 DIAGNOSIS — I4891 Unspecified atrial fibrillation: Secondary | ICD-10-CM | POA: Diagnosis not present

## 2023-01-02 DIAGNOSIS — Z96641 Presence of right artificial hip joint: Secondary | ICD-10-CM | POA: Diagnosis present

## 2023-01-02 DIAGNOSIS — E039 Hypothyroidism, unspecified: Secondary | ICD-10-CM | POA: Diagnosis present

## 2023-01-02 DIAGNOSIS — I161 Hypertensive emergency: Secondary | ICD-10-CM | POA: Diagnosis not present

## 2023-01-02 DIAGNOSIS — Z8249 Family history of ischemic heart disease and other diseases of the circulatory system: Secondary | ICD-10-CM

## 2023-01-02 DIAGNOSIS — I471 Supraventricular tachycardia, unspecified: Secondary | ICD-10-CM | POA: Diagnosis present

## 2023-01-02 DIAGNOSIS — Z881 Allergy status to other antibiotic agents status: Secondary | ICD-10-CM

## 2023-01-02 DIAGNOSIS — J90 Pleural effusion, not elsewhere classified: Secondary | ICD-10-CM | POA: Diagnosis not present

## 2023-01-02 DIAGNOSIS — Z79899 Other long term (current) drug therapy: Secondary | ICD-10-CM | POA: Diagnosis not present

## 2023-01-02 DIAGNOSIS — I2510 Atherosclerotic heart disease of native coronary artery without angina pectoris: Secondary | ICD-10-CM | POA: Diagnosis present

## 2023-01-02 DIAGNOSIS — E890 Postprocedural hypothyroidism: Secondary | ICD-10-CM | POA: Diagnosis not present

## 2023-01-02 DIAGNOSIS — R1909 Other intra-abdominal and pelvic swelling, mass and lump: Secondary | ICD-10-CM | POA: Diagnosis present

## 2023-01-02 DIAGNOSIS — I10 Essential (primary) hypertension: Secondary | ICD-10-CM | POA: Diagnosis present

## 2023-01-02 DIAGNOSIS — F419 Anxiety disorder, unspecified: Secondary | ICD-10-CM | POA: Diagnosis present

## 2023-01-02 DIAGNOSIS — I5032 Chronic diastolic (congestive) heart failure: Secondary | ICD-10-CM | POA: Diagnosis not present

## 2023-01-02 DIAGNOSIS — Z91040 Latex allergy status: Secondary | ICD-10-CM

## 2023-01-02 DIAGNOSIS — I25119 Atherosclerotic heart disease of native coronary artery with unspecified angina pectoris: Secondary | ICD-10-CM | POA: Diagnosis present

## 2023-01-02 DIAGNOSIS — R079 Chest pain, unspecified: Secondary | ICD-10-CM | POA: Diagnosis not present

## 2023-01-02 DIAGNOSIS — R000 Tachycardia, unspecified: Secondary | ICD-10-CM | POA: Diagnosis not present

## 2023-01-02 DIAGNOSIS — Z951 Presence of aortocoronary bypass graft: Secondary | ICD-10-CM | POA: Diagnosis not present

## 2023-01-02 DIAGNOSIS — Z7901 Long term (current) use of anticoagulants: Secondary | ICD-10-CM | POA: Diagnosis not present

## 2023-01-02 DIAGNOSIS — I959 Hypotension, unspecified: Secondary | ICD-10-CM | POA: Diagnosis not present

## 2023-01-02 DIAGNOSIS — I5033 Acute on chronic diastolic (congestive) heart failure: Secondary | ICD-10-CM | POA: Diagnosis present

## 2023-01-02 DIAGNOSIS — G4733 Obstructive sleep apnea (adult) (pediatric): Secondary | ICD-10-CM | POA: Diagnosis present

## 2023-01-02 DIAGNOSIS — I160 Hypertensive urgency: Secondary | ICD-10-CM | POA: Diagnosis not present

## 2023-01-02 DIAGNOSIS — I472 Ventricular tachycardia, unspecified: Secondary | ICD-10-CM | POA: Diagnosis not present

## 2023-01-02 DIAGNOSIS — Z83438 Family history of other disorder of lipoprotein metabolism and other lipidemia: Secondary | ICD-10-CM

## 2023-01-02 DIAGNOSIS — E0590 Thyrotoxicosis, unspecified without thyrotoxic crisis or storm: Secondary | ICD-10-CM | POA: Diagnosis present

## 2023-01-02 DIAGNOSIS — K219 Gastro-esophageal reflux disease without esophagitis: Secondary | ICD-10-CM | POA: Diagnosis present

## 2023-01-02 DIAGNOSIS — I499 Cardiac arrhythmia, unspecified: Secondary | ICD-10-CM | POA: Diagnosis not present

## 2023-01-02 DIAGNOSIS — Z880 Allergy status to penicillin: Secondary | ICD-10-CM | POA: Diagnosis not present

## 2023-01-02 DIAGNOSIS — E785 Hyperlipidemia, unspecified: Secondary | ICD-10-CM | POA: Diagnosis present

## 2023-01-02 DIAGNOSIS — I350 Nonrheumatic aortic (valve) stenosis: Secondary | ICD-10-CM | POA: Diagnosis not present

## 2023-01-02 DIAGNOSIS — N183 Chronic kidney disease, stage 3 unspecified: Secondary | ICD-10-CM | POA: Diagnosis not present

## 2023-01-02 DIAGNOSIS — Z87442 Personal history of urinary calculi: Secondary | ICD-10-CM

## 2023-01-02 DIAGNOSIS — Z45018 Encounter for adjustment and management of other part of cardiac pacemaker: Secondary | ICD-10-CM

## 2023-01-02 DIAGNOSIS — I080 Rheumatic disorders of both mitral and aortic valves: Secondary | ICD-10-CM | POA: Diagnosis present

## 2023-01-02 DIAGNOSIS — F32A Depression, unspecified: Secondary | ICD-10-CM | POA: Diagnosis present

## 2023-01-02 DIAGNOSIS — I482 Chronic atrial fibrillation, unspecified: Secondary | ICD-10-CM | POA: Diagnosis not present

## 2023-01-02 DIAGNOSIS — I441 Atrioventricular block, second degree: Secondary | ICD-10-CM | POA: Diagnosis present

## 2023-01-02 DIAGNOSIS — Z90710 Acquired absence of both cervix and uterus: Secondary | ICD-10-CM

## 2023-01-02 DIAGNOSIS — I4510 Unspecified right bundle-branch block: Secondary | ICD-10-CM | POA: Diagnosis present

## 2023-01-02 DIAGNOSIS — I700 Atherosclerosis of aorta: Secondary | ICD-10-CM | POA: Diagnosis not present

## 2023-01-02 DIAGNOSIS — Z888 Allergy status to other drugs, medicaments and biological substances status: Secondary | ICD-10-CM | POA: Diagnosis not present

## 2023-01-02 DIAGNOSIS — K668 Other specified disorders of peritoneum: Secondary | ICD-10-CM

## 2023-01-02 DIAGNOSIS — Z811 Family history of alcohol abuse and dependence: Secondary | ICD-10-CM

## 2023-01-02 LAB — COMPREHENSIVE METABOLIC PANEL
ALT: 35 U/L (ref 0–44)
AST: 29 U/L (ref 15–41)
Albumin: 3.6 g/dL (ref 3.5–5.0)
Alkaline Phosphatase: 88 U/L (ref 38–126)
Anion gap: 13 (ref 5–15)
BUN: 22 mg/dL (ref 8–23)
CO2: 22 mmol/L (ref 22–32)
Calcium: 9.5 mg/dL (ref 8.9–10.3)
Chloride: 105 mmol/L (ref 98–111)
Creatinine, Ser: 1.21 mg/dL — ABNORMAL HIGH (ref 0.44–1.00)
GFR, Estimated: 46 mL/min — ABNORMAL LOW (ref >60)
Glucose, Bld: 104 mg/dL — ABNORMAL HIGH (ref 70–99)
Potassium: 4.3 mmol/L (ref 3.5–5.1)
Sodium: 140 mmol/L (ref 135–145)
Total Bilirubin: 0.7 mg/dL (ref 0.3–1.2)
Total Protein: 7.2 g/dL (ref 6.5–8.1)

## 2023-01-02 LAB — I-STAT CHEM 8, ED
BUN: 33 mg/dL — ABNORMAL HIGH (ref 8–23)
Calcium, Ion: 1.03 mmol/L — ABNORMAL LOW (ref 1.15–1.40)
Chloride: 112 mmol/L — ABNORMAL HIGH (ref 98–111)
Creatinine, Ser: 1 mg/dL (ref 0.44–1.00)
Glucose, Bld: 97 mg/dL (ref 70–99)
HCT: 43 % (ref 36.0–46.0)
Hemoglobin: 14.6 g/dL (ref 12.0–15.0)
Potassium: 4.9 mmol/L (ref 3.5–5.1)
Sodium: 139 mmol/L (ref 135–145)
TCO2: 24 mmol/L (ref 22–32)

## 2023-01-02 LAB — CBC WITH DIFFERENTIAL/PLATELET
Abs Immature Granulocytes: 0.02 10*3/uL (ref 0.00–0.07)
Basophils Absolute: 0.1 10*3/uL (ref 0.0–0.1)
Basophils Relative: 1 %
Eosinophils Absolute: 0.1 10*3/uL (ref 0.0–0.5)
Eosinophils Relative: 1 %
HCT: 39.7 % (ref 36.0–46.0)
Hemoglobin: 13.1 g/dL (ref 12.0–15.0)
Immature Granulocytes: 0 %
Lymphocytes Relative: 17 %
Lymphs Abs: 1.8 10*3/uL (ref 0.7–4.0)
MCH: 29.6 pg (ref 26.0–34.0)
MCHC: 33 g/dL (ref 30.0–36.0)
MCV: 89.6 fL (ref 80.0–100.0)
Monocytes Absolute: 0.8 10*3/uL (ref 0.1–1.0)
Monocytes Relative: 8 %
Neutro Abs: 7.5 10*3/uL (ref 1.7–7.7)
Neutrophils Relative %: 73 %
Platelets: 234 10*3/uL (ref 150–400)
RBC: 4.43 MIL/uL (ref 3.87–5.11)
RDW: 14.4 % (ref 11.5–15.5)
WBC: 10.3 10*3/uL (ref 4.0–10.5)
nRBC: 0 % (ref 0.0–0.2)

## 2023-01-02 LAB — APTT: aPTT: 33 seconds (ref 24–36)

## 2023-01-02 LAB — TROPONIN I (HIGH SENSITIVITY)
Troponin I (High Sensitivity): 9 ng/L (ref <18)
Troponin I (High Sensitivity): 14 ng/L (ref <18)

## 2023-01-02 LAB — PROTIME-INR
INR: 1.5 — ABNORMAL HIGH (ref 0.8–1.2)
Prothrombin Time: 18.1 seconds — ABNORMAL HIGH (ref 11.4–15.2)

## 2023-01-02 LAB — LIPID PANEL
Cholesterol: 179 mg/dL (ref 0–200)
HDL: 62 mg/dL (ref >40)
LDL Cholesterol: 107 mg/dL — ABNORMAL HIGH (ref 0–99)
Total CHOL/HDL Ratio: 2.9 RATIO
Triglycerides: 50 mg/dL (ref <150)
VLDL: 10 mg/dL (ref 0–40)

## 2023-01-02 MED ORDER — FUROSEMIDE 20 MG PO TABS
20.0000 mg | ORAL_TABLET | Freq: Every day | ORAL | Status: DC
  Administered 2023-01-02 – 2023-01-05 (×4): 20 mg via ORAL
  Filled 2023-01-02 (×4): qty 1

## 2023-01-02 MED ORDER — MELATONIN 5 MG PO TABS
5.0000 mg | ORAL_TABLET | Freq: Every evening | ORAL | Status: DC | PRN
  Administered 2023-01-03 – 2023-01-04 (×3): 5 mg via ORAL
  Filled 2023-01-02 (×3): qty 1

## 2023-01-02 MED ORDER — LEVOTHYROXINE SODIUM 50 MCG PO TABS
50.0000 ug | ORAL_TABLET | Freq: Every morning | ORAL | Status: DC
  Administered 2023-01-03 – 2023-01-05 (×3): 50 ug via ORAL
  Filled 2023-01-02 (×2): qty 1
  Filled 2023-01-02: qty 2

## 2023-01-02 MED ORDER — APIXABAN 5 MG PO TABS
5.0000 mg | ORAL_TABLET | Freq: Two times a day (BID) | ORAL | Status: DC
  Administered 2023-01-02: 5 mg via ORAL
  Filled 2023-01-02: qty 1

## 2023-01-02 MED ORDER — ACETAMINOPHEN 650 MG RE SUPP: 650.0000 mg | Freq: Four times a day (QID) | RECTAL | Status: DC | PRN

## 2023-01-02 MED ORDER — ACETAMINOPHEN 325 MG PO TABS: 650.0000 mg | ORAL_TABLET | Freq: Four times a day (QID) | ORAL | Status: DC | PRN

## 2023-01-02 MED ORDER — IOHEXOL 350 MG/ML SOLN
100.0000 mL | Freq: Once | INTRAVENOUS | Status: AC | PRN
  Administered 2023-01-02: 100 mL via INTRAVENOUS

## 2023-01-02 MED ORDER — NITROGLYCERIN IN D5W 200-5 MCG/ML-% IV SOLN
0.0000 ug/min | INTRAVENOUS | Status: DC
  Administered 2023-01-02: 5 ug/min via INTRAVENOUS
  Filled 2023-01-02: qty 250

## 2023-01-02 MED ORDER — SODIUM CHLORIDE 0.9 % IV SOLN: INTRAVENOUS | Status: DC

## 2023-01-02 MED ORDER — ASPIRIN 81 MG PO CHEW: 324.0000 mg | CHEWABLE_TABLET | Freq: Once | ORAL | Status: DC

## 2023-01-02 MED ORDER — MORPHINE SULFATE (PF) 4 MG/ML IV SOLN
4.0000 mg | Freq: Once | INTRAVENOUS | Status: AC
  Administered 2023-01-02: 4 mg via INTRAVENOUS
  Filled 2023-01-02: qty 1

## 2023-01-02 MED ORDER — DILTIAZEM HCL ER COATED BEADS 120 MG PO CP24
120.0000 mg | ORAL_CAPSULE | Freq: Every day | ORAL | Status: DC
  Administered 2023-01-02 – 2023-01-05 (×4): 120 mg via ORAL
  Filled 2023-01-02 (×5): qty 1

## 2023-01-02 MED ORDER — ISOSORBIDE MONONITRATE ER 30 MG PO TB24: 30.0000 mg | ORAL_TABLET | Freq: Every day | ORAL | Status: DC

## 2023-01-02 MED ORDER — SENNOSIDES-DOCUSATE SODIUM 8.6-50 MG PO TABS: 1.0000 | ORAL_TABLET | Freq: Every evening | ORAL | Status: DC | PRN

## 2023-01-02 NOTE — ED Provider Notes (Signed)
[facility]    CSN: [id number] Arrival date & time: 01/02/23  1554      History   Chief Complaint Chief Complaint  Patient presents with   Chest Pain    HPI Samantha Short is a 77 y.o. female.   Patient presents today with a 12+ hour history of central chest pain.  Pain is rated 8 on a 0-10 pain scale, described as a pressure in the center of her chest, no alleviating factors identified.  She initially thought symptoms were related to heartburn so has been taking famotidine without improvement of symptoms.  She does have a significant cardiovascular history including coronary artery disease, Mobitz type II block with pacemaker, heart failure.  She is followed by cardiology.  She does report that her cardiologist made some medication changes recently and wonders if this could be contributing to symptoms.  She is chronically anticoagulation reports compliance with this medication.  She does report that she recently had surgery in December but has never had a blood clot.    Past Medical History:  Diagnosis Date   Anemia    pernicious   Anxiety    Arthritis    Asthma    Chronic diastolic CHF (congestive heart failure)    a. 02/2011 Echo: EF 55-60%, Gr2 DD, Mild MR   Chronic kidney disease    stage 3   Complication of anesthesia    Coronary artery disease    a. s/p CABG x 1 2010:  LIMA->LAD.;  b. amdx for CP => LHC 07/04/12: LAD 70-80%, mid RCA 30%, LIMA-LAD patent with competitive flow limiting distal LAD filling, EF 70% with hyperdynamic LV function. Medical therapy continued.   Depression    Dyslipidemia    Dysrhythmia    hx of Afib   GERD (gastroesophageal reflux disease)    Headache    hx of migraines   Heart murmur    History of hiatal hernia    History of kidney stones    Hyperlipidemia    Hypertension    Hyperthyroidism    Hypothyroidism    Mild aortic stenosis    Mitral regurgitation    a. mild by echo 02/2011.   Pneumonia    PONV (postoperative  nausea and vomiting)    Presence of permanent cardiac pacemaker    Sleep apnea    diagnosed 08-2022    Patient Active Problem List   Diagnosis Date Noted   OA (osteoarthritis) of hip 08/30/2022   Osteoarthritis of right hip 08/30/2022   Pacemaker - MDT 06/21/2020   Myalgia 03/25/2020   Mobitz type 2 second degree heart block 03/18/2020   AVD (aortic valve disease)    Hypothyroidism 01/23/2020   Weight gain 02/08/2018   Chronic diastolic CHF (congestive heart failure) 07/30/2017   Chest pain, mid sternal 07/05/2012   Acid reflux    Coronary artery disease    Hyperlipidemia    Hypertension    Heart murmur     Past Surgical History:  Procedure Laterality Date   ABDOMINAL HYSTERECTOMY  1980   CARDIAC CATHETERIZATION  07/27/09 & 07/28/09   CESAREAN SECTION     CORONARY ARTERY BYPASS GRAFT  08/03/2009   x1 using left internal mammary artery to distal left anterior  descending coronary artery.    GALLBLADDER SURGERY  2001   LEFT HEART CATHETERIZATION WITH CORONARY/GRAFT ANGIOGRAM N/A 07/05/2012   Procedure: LEFT HEART CATHETERIZATION WITH Isabel Caprice;  Surgeon: Wendall Stade, MD;  Location: [facility] CATH LAB;  Service:  Cardiovascular;  Laterality: N/A;   PACEMAKER IMPLANT N/A 03/17/2020   Procedure: PACEMAKER IMPLANT;  Surgeon: Duke Salvia, MD;  Location: [facility] INVASIVE CV LAB;  Service: Cardiovascular;  Laterality: N/A;   RIGHT/LEFT HEART CATH AND CORONARY/GRAFT ANGIOGRAPHY N/A 03/17/2020   Procedure: RIGHT/LEFT HEART CATH AND CORONARY/GRAFT ANGIOGRAPHY;  Surgeon: Iran Ouch, MD;  Location: [facility] INVASIVE CV LAB;  Service: Cardiovascular;  Laterality: N/A;   SHOULDER SURGERY  1998 / 2001   from accident   rotator cuff   TOTAL HIP ARTHROPLASTY Right 08/30/2022   Procedure: TOTAL HIP ARTHROPLASTY ANTERIOR APPROACH;  Surgeon: Ollen Gross, MD;  Location: [facility] ORS;  Service: Orthopedics;  Laterality: Right;   VESICOVAGINAL FISTULA CLOSURE W/ TAH  1998    OB History      Gravida  3   Para  3   Term      Preterm      AB      Living  3      SAB      IAB      Ectopic      Multiple      Live Births               Home Medications    Prior to Admission medications   Medication Sig Start Date End Date Taking? Authorizing Provider  acetaminophen (TYLENOL) 500 MG tablet Take 500 mg by mouth every 6 (six) hours as needed for moderate pain.    [provider]  apixaban (ELIQUIS) 5 MG TABS tablet Take 1 tablet (5 mg total) by mouth 2 (two) times daily. 07/12/22   Duke Salvia, MD  cholecalciferol (VITAMIN D3) 25 MCG (1000 UNIT) tablet Take 1,000 Units by mouth daily.    [provider]  cyanocobalamin (,VITAMIN B-12,) 1000 MCG/ML injection Inject 1,000 mcg into the muscle every 30 (thirty) days.    [provider]  diltiazem (CARDIZEM CD) 120 MG 24 hr capsule Take 1 capsule (120 mg total) by mouth daily. 07/12/22   Duke Salvia, MD  fluticasone [facility]) 50 MCG/ACT nasal spray Place 2 sprays into both nostrils daily as needed for allergies or rhinitis.    [provider]  furosemide (LASIX) 20 MG tablet Take 1 tablet by mouth daily. 04/12/20   [provider]  HYDROcodone-acetaminophen (NORCO/VICODIN) 5-325 MG tablet Take 1-2 tablets by mouth every 6 (six) hours as needed for severe pain. 09/01/22   Edmisten, Kristie L, PA  isosorbide mononitrate (IMDUR) 30 MG 24 hr tablet TAKE 1 TABLET AT BEDTIME. PLEASE KEEP UPCOMING APPOINTMENT FOR FUTURE REFILLS. 04/24/22   Kathleene Hazel, MD  levothyroxine (SYNTHROID) 50 MCG tablet Take 50 mcg by mouth every morning. 04/19/20   [provider]  loratadine (CLARITIN) 10 MG tablet Take 10 mg by mouth daily.    [provider]  methocarbamol (ROBAXIN) 500 MG tablet Take 1 tablet (500 mg total) by mouth every 6 (six) hours as needed for muscle spasms. 09/01/22   Edmisten, Lyn Hollingshead, PA  Multiple Vitamin (MULTIVITAMIN WITH MINERALS) TABS  tablet Take 1 tablet by mouth daily.    [provider]  Multiple Vitamins-Minerals (PRESERVISION AREDS 2) CAPS Take 1 capsule by mouth 2 (two) times daily.    [provider]  nitroGLYCERIN (NITROSTAT) 0.4 MG SL tablet PLACE 1 TABLET UNDER THE TONGUE EVERY 5 MINUTES AS NEEDED FOR CHEST PAIN 03/07/22   Kathleene Hazel, MD  Omega-3 Fatty Acids (FISH OIL PO) Take 1 capsule by  mouth daily.    [provider]  traMADol (ULTRAM) 50 MG tablet Take 1-2 tablets (50-100 mg total) by mouth every 6 (six) hours as needed for moderate pain. 09/01/22   Edmisten, Lyn Hollingshead, PA  traZODone (DESYREL) 50 MG tablet Take 25 mg by mouth at bedtime. 08/06/22   [provider]    Family History Family History  Problem Relation Age of Onset   Cancer Father    Heart attack Father    High blood pressure Father    High Cholesterol Father    Heart disease Father    Alcoholism Father    Obesity Father    Heart attack Mother    Heart disease Mother        had pacemaker   High blood pressure Mother    High Cholesterol Mother    Obesity Mother    Cancer Brother    Heart disease Brother    Heart disease Sister    Cancer Sister    Angina Sister    Coronary artery disease Son    Hypertension Sister    Hypertension Brother    Thyroid disease Neg Hx     Social History Social History   Tobacco Use   Smoking status: Former    Years: 7    Types: Cigarettes    Quit date: 07/12/2010    Years since quitting: 12.4   Smokeless tobacco: Never   Tobacco comments:    Never a heavy smoker 2 a day  Vaping Use   Vaping Use: Never used  Substance Use Topics   Alcohol use: No   Drug use: No     Allergies   Ceclor [cefaclor], Hctz [hydrochlorothiazide], Lipitor [atorvastatin calcium], Norvasc [amlodipine], Penicillins, Sulfa antibiotics, Statins, Milk-related compounds, Erythromycin, Latex, Levofloxacin, and Tetracyclines & related   Review of Systems Review of Systems   Constitutional:  Positive for activity change. Negative for appetite change, fatigue and fever.  Respiratory:  Positive for shortness of breath. Negative for cough.   Cardiovascular:  Positive for chest pain and palpitations. Negative for leg swelling.  Gastrointestinal:  Negative for abdominal pain, diarrhea, nausea and vomiting.     Physical Exam Triage Vital Signs ED Triage Vitals  Enc Vitals Group     BP 01/02/23 1623 (!) 183/98     Pulse Rate 01/02/23 1623 (!) 147     Resp 01/02/23 1623 18     Temp 01/02/23 1623 97.6 F (36.4 C)     Temp Source 01/02/23 1623 Oral     SpO2 01/02/23 1623 93 %     Weight --      Height --      Head Circumference --      Peak Flow --      Pain Score 01/02/23 1625 8     Pain Loc --      Pain Edu? --      Excl. in GC? --    No data found.  Updated Vital Signs BP (!) 183/98 (BP Location: Left Arm)   Pulse (!) 147   Temp 97.6 F (36.4 C) (Oral)   Resp 18   SpO2 93%   Visual Acuity Right Eye Distance:   Left Eye Distance:   Bilateral Distance:    Right Eye Near:   Left Eye Near:    Bilateral Near:     Physical Exam Vitals reviewed.  Constitutional:      General: She is awake. She is not in acute distress.  Appearance: Normal appearance. She is well-developed. She is ill-appearing.     Comments: Very pleasant female appears stated age in no acute distress laying on exam room table.  HENT:     Head: Normocephalic and atraumatic.  Cardiovascular:     Rate and Rhythm: Regular rhythm. Tachycardia present.     Heart sounds: S1 normal and S2 normal. Murmur heard.  Pulmonary:     Effort: Pulmonary effort is normal.     Breath sounds: Normal breath sounds. No wheezing, rhonchi or rales.     Comments: Clear to auscultation bilaterally Abdominal:     Palpations: Abdomen is soft.     Tenderness: There is no abdominal tenderness.  Psychiatric:        Behavior: Behavior is cooperative.      UC Treatments / Results  Labs (all  labs ordered are listed, but only abnormal results are displayed) Labs Reviewed - No data to display  EKG   Radiology No results found.  Procedures Procedures (including critical care time)  Medications Ordered in UC Medications - No data to display  Initial Impression / Assessment and Plan / UC Course  I have reviewed the triage vital signs and the nursing notes.  Pertinent labs & imaging results that were available during my care of the patient were reviewed by me and considered in my medical decision making (see chart for details).     EKG was obtained that showed SVT with ventricular rate of 147 bpm.  Given SVT and cardiovascular history recommend she go to the emergency room by EMS.  Patient is agreeable and EMS was called.  She was transported to ER in stable at time of discharge.  Final Clinical Impressions(s) / UC Diagnoses   Final diagnoses:  SVT (supraventricular tachycardia)  Chest pain, unspecified type  SOB (shortness of breath)   Discharge Instructions   None    ED Prescriptions   None    PDMP not reviewed this encounter.   Jeani Hawking, PA-C 01/02/23 1635

## 2023-01-02 NOTE — ED Provider Notes (Signed)
[place] EMERGENCY DEPARTMENT AT [facility] Provider Note   CSN: [id number] Arrival date & time: 01/02/23  1725     History {Add pertinent medical, surgical, social history, OB history to HPI:1} Chief Complaint  Patient presents with  . Chest Pain    Samantha Short is a 77 y.o. female who presents emergency department with a chief complaint of chest pain.  Patient was sent in from the urgent care.  Patient reports that she has been having episodes of pain which she thought was indigestion since yesterday evening.  This is going to been going on for approximately 18 hours.  She took antacids without improvement in her symptoms.  She has a significant cardiovascular history including CAD status post CABG, Mobitz type II status post pacemaker.  She has a history of heart failure.  She is followed by Dr. Graciela Husbands and Dr. Clifton James.  She has a history of atrial fibrillation and is anticoagulated on Eliquis 5 mg twice daily.  Patient reports the pain is severe, it radiates between her shoulder blades. Patient also reports that over the past week and a half she has had to use her nitroglycerin twice due to the same type of symptoms that occurred while she was working in her garden.  She reports that she has not had to use her nitroglycerin in months.  Chest Pain      Home Medications Prior to Admission medications   Medication Sig Start Date End Date Taking? Authorizing Provider  acetaminophen (TYLENOL) 500 MG tablet Take 500 mg by mouth every 6 (six) hours as needed for moderate pain.    [provider]  apixaban (ELIQUIS) 5 MG TABS tablet Take 1 tablet (5 mg total) by mouth 2 (two) times daily. 07/12/22   Duke Salvia, MD  cholecalciferol (VITAMIN D3) 25 MCG (1000 UNIT) tablet Take 1,000 Units by mouth daily.    [provider]  cyanocobalamin (,VITAMIN B-12,) 1000 MCG/ML injection Inject 1,000 mcg into the muscle every 30 (thirty) days.    [provider]  diltiazem (CARDIZEM CD) 120 MG 24 hr capsule Take 1 capsule (120 mg total) by mouth daily. 07/12/22   Duke Salvia, MD  fluticasone [facility]) 50 MCG/ACT nasal spray Place 2 sprays into both nostrils daily as needed for allergies or rhinitis.    [provider]  furosemide (LASIX) 20 MG tablet Take 1 tablet by mouth daily. 04/12/20   [provider]  HYDROcodone-acetaminophen (NORCO/VICODIN) 5-325 MG tablet Take 1-2 tablets by mouth every 6 (six) hours as needed for severe pain. 09/01/22   Edmisten, Kristie L, PA  isosorbide mononitrate (IMDUR) 30 MG 24 hr tablet TAKE 1 TABLET AT BEDTIME. PLEASE KEEP UPCOMING APPOINTMENT FOR FUTURE REFILLS. 04/24/22   Kathleene Hazel, MD  levothyroxine (SYNTHROID) 50 MCG tablet Take 50 mcg by mouth every morning. 04/19/20   [provider]  loratadine (CLARITIN) 10 MG tablet Take 10 mg by mouth daily.    [provider]  methocarbamol (ROBAXIN) 500 MG tablet Take 1 tablet (500 mg total) by mouth every 6 (six) hours as needed for muscle spasms. 09/01/22   Edmisten, Lyn Hollingshead, PA  Multiple Vitamin (MULTIVITAMIN WITH MINERALS) TABS tablet Take 1 tablet by mouth daily.    [provider]  Multiple Vitamins-Minerals (PRESERVISION AREDS 2) CAPS Take 1 capsule by mouth 2 (two) times daily.    [provider]  nitroGLYCERIN (NITROSTAT) 0.4 MG SL tablet PLACE 1 TABLET UNDER THE TONGUE  EVERY 5 MINUTES AS NEEDED FOR CHEST PAIN 03/07/22   Kathleene Hazel, MD  Omega-3 Fatty Acids (FISH OIL PO) Take 1 capsule by mouth daily.    [provider]  traMADol (ULTRAM) 50 MG tablet Take 1-2 tablets (50-100 mg total) by mouth every 6 (six) hours as needed for moderate pain. 09/01/22   Edmisten, Lyn Hollingshead, PA  traZODone (DESYREL) 50 MG tablet Take 25 mg by mouth at bedtime. 08/06/22   [provider]      Allergies    Ceclor [cefaclor], Hctz [hydrochlorothiazide], Lipitor [atorvastatin calcium],  Norvasc [amlodipine], Penicillins, Sulfa antibiotics, Statins, Milk-related compounds, Erythromycin, Latex, Levofloxacin, and Tetracyclines & related    Review of Systems   Review of Systems  Cardiovascular:  Positive for chest pain.    Physical Exam Updated Vital Signs BP (!) 142/87   Pulse (!) 124   Temp 97.6 F (36.4 C) (Oral)   Resp 14   Ht  (1.6 m)   Wt 90 kg   SpO2 100%   BMI 35.15 kg/m  Physical Exam Vitals and nursing note reviewed.  Constitutional:      General: She is not in acute distress.    Appearance: She is well-developed. She is not diaphoretic.  HENT:     Head: Normocephalic and atraumatic.     Right Ear: External ear normal.     Left Ear: External ear normal.     Nose: Nose normal.     Mouth/Throat:     Mouth: Mucous membranes are moist.  Eyes:     General: No scleral icterus.    Conjunctiva/sclera: Conjunctivae normal.  Cardiovascular:     Rate and Rhythm: Normal rate and regular rhythm.     Heart sounds: Normal heart sounds. No murmur heard.    No friction rub. No gallop.  Pulmonary:     Effort: Pulmonary effort is normal. No respiratory distress.     Breath sounds: Normal breath sounds.  Abdominal:     General: Bowel sounds are normal. There is no distension.     Palpations: Abdomen is soft. There is no mass.     Tenderness: There is no abdominal tenderness. There is no guarding.  Musculoskeletal:     Cervical back: Normal range of motion.  Skin:    General: Skin is warm and dry.  Neurological:     Mental Status: She is alert and oriented to person, place, and time.  Psychiatric:        Behavior: Behavior normal.     ED Results / Procedures / Treatments   Labs (all labs ordered are listed, but only abnormal results are displayed) Labs Reviewed  PROTIME-INR - Abnormal; Notable for the following components:      Result Value   Prothrombin Time 18.1 (*)    INR 1.5 (*)    All other components within normal limits  COMPREHENSIVE  METABOLIC PANEL - Abnormal; Notable for the following components:   Glucose, Bld 104 (*)    Creatinine, Ser 1.21 (*)    GFR, Estimated 46 (*)    All other components within normal limits  LIPID PANEL - Abnormal; Notable for the following components:   LDL Cholesterol 107 (*)    All other components within normal limits  I-STAT CHEM 8, ED - Abnormal; Notable for the following components:   Chloride 112 (*)    BUN 33 (*)    Calcium, Ion 1.03 (*)    All other components within normal limits  CBC WITH DIFFERENTIAL/PLATELET  APTT  HEMOGLOBIN A1C  TROPONIN I (HIGH SENSITIVITY)    EKG EKG Interpretation  Date/Time:  Tuesday January 02 2023 17:33:05 EDT Ventricular Rate:  120 PR Interval:    QRS Duration: 125 QT Interval:  365 QTC Calculation: 516 R Axis:   99 Text Interpretation: Atrial fibrillation RBBB and LPFB Repol abnrm suggests ischemia, diffuse leads diffuse schemic appearance. new since prevous tracings Confirmed by Arby Barrette [phone]) on 01/02/2023 5:39:16 PM  Radiology CT Angio Chest/Abd/Pel for Dissection W and/or W/WO  Result Date: 01/02/2023 CLINICAL DATA:  Chest pain, right pleural effusion EXAM: CT ANGIOGRAPHY CHEST, ABDOMEN AND PELVIS TECHNIQUE: Non-contrast CT of the chest was initially obtained. Multidetector CT imaging through the chest, abdomen and pelvis was performed using the standard protocol during bolus administration of intravenous contrast. Multiplanar reconstructed images and MIPs were obtained and reviewed to evaluate the vascular anatomy. RADIATION DOSE REDUCTION: This exam was performed according to the departmental dose-optimization program which includes automated exposure control, adjustment of the mA and/or kV according to patient size and/or use of iterative reconstruction technique. CONTRAST:  OMNIPAQUE IOHEXOL 350 MG/ML SOLN COMPARISON:  01/02/2023, 03/22/2021 FINDINGS: CTA CHEST FINDINGS Cardiovascular: Evaluation of the thoracic aorta is  limited by patient motion during the exam. No evidence of thoracic aortic aneurysm or dissection. Dual lead pacemaker is seen within the heart. No pericardial effusion. Timing of contrast bolus limits evaluation of the pulmonary vasculature. Mediastinum/Nodes: Calcified mediastinal and hilar lymph nodes consistent with previous granulomatous disease. No pathologic adenopathy. Thyroid, trachea, and esophagus are unremarkable. Lungs/Pleura: Bilateral calcified granulomata are noted. Trace right pleural effusion. No airspace disease or pneumothorax. Central airways are patent. Musculoskeletal: No acute or destructive bony lesions. Reconstructed images demonstrate no additional findings. Review of the MIP images confirms the above findings. CTA ABDOMEN AND PELVIS FINDINGS VASCULAR Aorta: Normal caliber aorta without aneurysm, dissection, vasculitis or significant stenosis. Celiac: Patent without evidence of aneurysm, dissection, vasculitis or significant stenosis. SMA: Patent without evidence of aneurysm, dissection, vasculitis or significant stenosis. Renals: No duplicated bilateral renal arteries. Bilateral renal arteries are patent, without evidence of aneurysm, dissection, vasculitis, or fibromuscular dysplasia. No critical stenosis is identified. IMA: Patent without evidence of aneurysm, dissection, vasculitis or significant stenosis. Inflow: Patent without evidence of aneurysm, dissection, vasculitis or significant stenosis. Veins: No obvious venous abnormality within the limitations of this arterial phase study. Review of the MIP images confirms the above findings. NON-VASCULAR Hepatobiliary: No focal liver abnormality is seen. Status post cholecystectomy. No biliary dilatation. Pancreas: Unremarkable. No pancreatic ductal dilatation or surrounding inflammatory changes. Spleen: Normal in size without focal abnormality. Adrenals/Urinary Tract: No urinary tract calculi or obstructive uropathy within either kidney.  Kidneys enhance normally and symmetrically. The adrenals and bladder are unremarkable. Stomach/Bowel: No bowel obstruction or ileus. Normal appendix right lower quadrant. Diverticulosis of the distal colon without diverticulitis. No bowel wall thickening or inflammatory change. Lymphatic: Multiple subcentimeter mesenteric lymph nodes are identified. Within the central mesentery there is a partially calcified soft tissue mass measuring 4.5 x 2.8 cm in transverse dimension, reference image 154/6, and extending approximately 6 cm in craniocaudal extent. Portions of the mass demonstrate a mildly spiculated margin, while other borders remain lobular. Differential diagnosis would include carcinoid tumor, calcifying fibrous tumor of the mesentery, treated lymphoma, or sequela of previous infection or trauma. No previous imaging of the abdomen and pelvis is available for comparison. Reproductive: Status post hysterectomy. No adnexal masses. Other: No free fluid or free intraperitoneal gas. No  abdominal wall hernia. Musculoskeletal: Right hip arthroplasty. No acute or destructive bony lesions. Reconstructed images demonstrate no additional findings. Review of the MIP images confirms the above findings. IMPRESSION: 1. No evidence of thoracoabdominal aortic aneurysm or dissection. 2. Trace right pleural effusion. 3. Indeterminate partially calcified central mesenteric mass. There is a wide differential diagnosis, which includes carcinoid tumor, calcified fibrous tumor of the mesentery, treated lymphoma, or sequela of previous infection or trauma. There is no previous abdominal imaging available for comparison. Please correlate with any known oncologic history. 4. Distal colonic diverticulosis without diverticulitis. 5.  Aortic Atherosclerosis (ICD10-I70.0). Electronically Signed   By: Sharlet Salina M.D.   On: 01/02/2023 18:51   DG Chest Port 1 View  Result Date: 01/02/2023 CLINICAL DATA:  Chest pain EXAM: PORTABLE CHEST 1  VIEW COMPARISON:  03/22/21 cxr FINDINGS: Left-sided dual lead cardiac device in place with leads in the right atrium and right ventricle. Status post median sternotomy. Unchanged cardiac and mediastinal contours. New small right-sided pleural effusion. No pneumothorax. No focal airspace opacity. No radiographically apparent displaced rib fractures. Visualized upper abdomen is unremarkable IMPRESSION: New small right-sided pleural effusion. Electronically Signed   By: Lorenza Cambridge M.D.   On: 01/02/2023 18:02    Procedures Procedures  {Document cardiac monitor, telemetry assessment procedure when appropriate:1}  Medications Ordered in ED Medications  0.9 %  sodium chloride infusion ( Intravenous New Bag/Given 01/02/23 1754)  aspirin chewable tablet 324 mg (324 mg Oral Not Given 01/02/23 1740)  nitroGLYCERIN 50 mg in dextrose 5 % 250 mL (0.2 mg/mL) infusion (10 mcg/min Intravenous Rate/Dose Change 01/02/23 1805)  morphine (PF) 4 MG/ML injection 4 mg (4 mg Intravenous Given 01/02/23 1755)  iohexol (OMNIPAQUE) 350 MG/ML injection 100 mL (100 mLs Intravenous Contrast Given 01/02/23 1830)    ED Course/ Medical Decision Making/ A&P Clinical Course as of 01/02/23 1914  Tue Jan 02, 2023  1909 Troponin I (High Sensitivity) [AH]  1909 Prothrombin Time(!): 18.1 [AH]  1909 INR(!): 1.5 [AH]  1909 Comprehensive metabolic panel(!) [AH]  1909 Creatinine(!): 1.21 [AH]  1909 DG Chest Port 1 View I visualized and interpreted 1 view chest x-ray which shows a small right-sided pleural effusion [AH]  1910 CT Angio Chest/Abd/Pel for Dissection W and/or W/WO [AH]  1913 EKG shows A-fib with a rate of 120.  Diffuse ST segment depressions in V4 through 6.  I initially called a STEMI and spoke with the STEMI physician on-call.  He feels that this is more likely to be hypertensive emergency and asked that we regulate her blood pressure.  Patient seen and shared visit with Dr. Clarice Pole.  She has been placed on a nitroglycerin  drip. [AH]  1913 I reevaluated the patient at bedside.  She is headache and chest pain-free at this time.  Blood pressure is better controlled. [AH]    Clinical Course User Index [AH] Arthor Captain, PA-C   {   Click here for ABCD2, HEART and other calculatorsREFRESH Note before signing :1}                          Medical Decision Making Amount and/or Complexity of Data Reviewed Labs: ordered. Radiology: ordered.  Risk OTC drugs. Prescription drug management. Decision regarding hospitalization.   ***  {Document critical care time when appropriate:1} {Document review of labs and clinical decision tools ie heart score, Chads2Vasc2 etc:1}  {Document your independent review of radiology images, and any outside records:1} {Document your discussion with  family members, caretakers, and with consultants:1} {Document social determinants of health affecting pt's care:1} {Document your decision making why or why not admission, treatments were needed:1} Final Clinical Impression(s) / ED Diagnoses Final diagnoses:  Hypertensive emergency    Rx / DC Orders ED Discharge Orders     None

## 2023-01-02 NOTE — Consult Note (Signed)
Cardiology Consultation   Patient ID: KALANA YUST MRN: [id number]; DOB: 1946/09/02  Admit date: 01/02/2023 Date of Consult: 01/02/2023  PCP:  Mila Palmer, MD   [facility] Providers Cardiologist:  Verne Carrow, MD        Patient Profile:   AVION PATELLA is a 77 y.o. female with a hx of  CABG (LIMA to LAD in 2010), CHB s/p Medtronic PPM last year, Afib, HFpEF, GERD, HLD, HTN,  who is being seen 01/02/2023 for the evaluation of HTN urgency and CP at the request of Dr. Joneen Roach  History of Present Illness:   Ms. Mifsud   is a 77 y.o. female with a hx of  CABG (LIMA to LAD in 2010), CHB s/p Medtronic PPM last year, Afib, HFpEF, GERD, HLD, HTN,  who is being seen 01/02/2023 for the evaluation of HTN urgency and CP at the request of Dr. Joneen Roach. She is s/p CABG x 1 2010: LIMA->LAD.; b. amdx for CP => LHC 07/04/12: LAD 70-80%, mid RCA 30%, LIMA-LAD patent with competitive flow limiting distal LAD filling, EF 70% with hyperdynamic LV function. Medical therapy continued.   She has been having chest tightness since 2 days hence came to the ED today. Had some associated CP as well- but mainly tightness. She initially thought symptoms were related to heartburn so has been taking famotidine without improvement of symptoms. She is followed by Dr. Graciela Husbands and Dr. Clifton James. She has a history of atrial fibrillation and is anticoagulated on Eliquis 5 mg twice daily. When she came to the ED, she was Afib RVR and hypertensive to 190s. Patient also reports that over the past week and a half she has had to use her nitroglycerin twice. EKG had shown diffuse ST segment depressions in V4 through 6. STEMI physician on-call felt that this is more likely to be hypertensive based. She was admitted to the hospitalist service for BP control, and cardiology was consulted. Her troponin 9 and 14.   Past Medical History:  Diagnosis Date   Anemia    pernicious   Anxiety    Arthritis    Asthma     Chronic diastolic CHF (congestive heart failure)    a. 02/2011 Echo: EF 55-60%, Gr2 DD, Mild MR   Chronic kidney disease    stage 3   Complication of anesthesia    Coronary artery disease    a. s/p CABG x 1 2010:  LIMA->LAD.;  b. amdx for CP => LHC 07/04/12: LAD 70-80%, mid RCA 30%, LIMA-LAD patent with competitive flow limiting distal LAD filling, EF 70% with hyperdynamic LV function. Medical therapy continued.   Depression    Dyslipidemia    Dysrhythmia    hx of Afib   GERD (gastroesophageal reflux disease)    Headache    hx of migraines   Heart murmur    History of hiatal hernia    History of kidney stones    Hyperlipidemia    Hypertension    Hyperthyroidism    Hypothyroidism    Mild aortic stenosis    Mitral regurgitation    a. mild by echo 02/2011.   Pneumonia    PONV (postoperative nausea and vomiting)    Presence of permanent cardiac pacemaker    Sleep apnea    diagnosed 08-2022    Past Surgical History:  Procedure Laterality Date   ABDOMINAL HYSTERECTOMY  1980   CARDIAC CATHETERIZATION  07/27/09 & 07/28/09   CESAREAN SECTION     CORONARY ARTERY  BYPASS GRAFT  08/03/2009   x1 using left internal mammary artery to distal left anterior  descending coronary artery.    GALLBLADDER SURGERY  2001   LEFT HEART CATHETERIZATION WITH CORONARY/GRAFT ANGIOGRAM N/A 07/05/2012   Procedure: LEFT HEART CATHETERIZATION WITH Isabel Caprice;  Surgeon: Wendall Stade, MD;  Location: [facility] CATH LAB;  Service: Cardiovascular;  Laterality: N/A;   PACEMAKER IMPLANT N/A 03/17/2020   Procedure: PACEMAKER IMPLANT;  Surgeon: Duke Salvia, MD;  Location: [facility] INVASIVE CV LAB;  Service: Cardiovascular;  Laterality: N/A;   RIGHT/LEFT HEART CATH AND CORONARY/GRAFT ANGIOGRAPHY N/A 03/17/2020   Procedure: RIGHT/LEFT HEART CATH AND CORONARY/GRAFT ANGIOGRAPHY;  Surgeon: Iran Ouch, MD;  Location: [facility] INVASIVE CV LAB;  Service: Cardiovascular;  Laterality: N/A;   SHOULDER SURGERY  1998 /  2001   from accident   rotator cuff   TOTAL HIP ARTHROPLASTY Right 08/30/2022   Procedure: TOTAL HIP ARTHROPLASTY ANTERIOR APPROACH;  Surgeon: Ollen Gross, MD;  Location: [facility] ORS;  Service: Orthopedics;  Laterality: Right;   VESICOVAGINAL FISTULA CLOSURE W/ TAH  1998       Inpatient Medications: Scheduled Meds:  apixaban  5 mg Oral BID   aspirin  324 mg Oral Once   diltiazem  120 mg Oral Daily   furosemide  20 mg Oral Daily   [START ON 01/03/2023] levothyroxine  50 mcg Oral q morning   Continuous Infusions:  sodium chloride 10 mL/hr at 01/02/23 1754   nitroGLYCERIN 10 mcg/min (01/02/23 1805)   PRN Meds: acetaminophen **OR** acetaminophen, melatonin, senna-docusate  Allergies:    Allergies  Allergen Reactions   Ceclor [Cefaclor] Other (See Comments)    Lost vision in eye   Hctz [Hydrochlorothiazide] Other (See Comments)    Renal insufficiency   Lipitor [Atorvastatin Calcium] Other (See Comments)    Increased A1C   Norvasc [Amlodipine] Other (See Comments)    Makes patient stiff   Penicillins Hives, Shortness Of Breath, Itching and Rash    Has patient had a PCN reaction causing immediate rash, facial/tongue/throat swelling, SOB or lightheadedness with hypotension: Yes Has patient had a PCN reaction causing severe rash involving mucus membranes or skin necrosis: Unk Has patient had a PCN reaction that required hospitalization: No Has patient had a PCN reaction occurring within the last 10 years: No If all of the above answers are "NO", then may proceed with Cephalosporin use.    Sulfa Antibiotics Other (See Comments)    CAUSES SHOCK   Statins Other (See Comments)    MYALGIAS AND WEAKNESS   Milk-Related Compounds Diarrhea and Nausea And Vomiting   Erythromycin Rash   Latex Rash   Levofloxacin Rash   Tetracyclines & Related Rash    Social History:   Social History   Socioeconomic History   Marital status: Widowed    Spouse name: Not on file   Number of children:  3   Years of education: Not on file   Highest education level: Not on file  Occupational History   Occupation: Retired- Engineer, site  Tobacco Use   Smoking status: Former    Years: 7    Types: Cigarettes    Quit date: 07/12/2010    Years since quitting: 12.4   Smokeless tobacco: Never   Tobacco comments:    Never a heavy smoker 2 a day  Vaping Use   Vaping Use: Never used  Substance and Sexual Activity   Alcohol use: No   Drug use: No   Sexual activity: Not Currently  Other Topics Concern   Not on file  Social History Narrative   Not on file   Social Determinants of Health   Financial Resource Strain: Not on file  Food Insecurity: No Food Insecurity (08/30/2022)   Hunger Vital Sign    Worried About Running Out of Food in the Last Year: Never true    Ran Out of Food in the Last Year: Never true  Transportation Needs: No Transportation Needs (08/30/2022)   PRAPARE - Administrator, Civil Service (Medical): No    Lack of Transportation (Non-Medical): No  Physical Activity: Not on file  Stress: Not on file  Social Connections: Not on file  Intimate Partner Violence: Not At Risk (08/30/2022)   Humiliation, Afraid, Rape, and Kick questionnaire    Fear of Current or Ex-Partner: No    Emotionally Abused: No    Physically Abused: No    Sexually Abused: No    Family History:   Family History  Problem Relation Age of Onset   Cancer Father    Heart attack Father    High blood pressure Father    High Cholesterol Father    Heart disease Father    Alcoholism Father    Obesity Father    Heart attack Mother    Heart disease Mother        had pacemaker   High blood pressure Mother    High Cholesterol Mother    Obesity Mother    Cancer Brother    Heart disease Brother    Heart disease Sister    Cancer Sister    Angina Sister    Coronary artery disease Son    Hypertension Sister    Hypertension Brother    Thyroid disease Neg Hx      ROS:  Please see  the history of present illness.  All other ROS reviewed and negative.     Physical Exam/Data:   Vitals:   01/02/23 2215 01/02/23 2309 01/02/23 2313 01/02/23 2330  BP: (!) 114/94 127/87  139/81  Pulse: (!) 147   (!) 102  Resp: Temp:   97.7 F (36.5 C)   TempSrc:   Oral   SpO2: 100% 99%  99%  Weight:      Height:       No intake or output data in the 24 hours ending 01/02/23 2343    01/02/2023    5:28 PM 10/17/2022    8:41 PM 08/30/2022    9:55 AM  Last 3 Weights  Weight (lbs) 198 lb 6.6 oz 198 lb 198 lb  Weight (kg) 90 kg 89.812 kg 89.812 kg     Body mass index is 35.15 kg/m.  General:  Well nourished, well developed, in mild acute distress HEENT: normal Neck: no JVD Vascular: No carotid bruits; Distal pulses 2+ bilaterally Cardiac:  normal S1, S2; irregular tachycardic Lungs: Poor airflow Abd: soft, nontender, no hepatomegaly  Ext: no edema Musculoskeletal:  No deformities, BUE and BLE strength normal and equal Skin: warm and dry  Neuro:  CNs 2-12 intact, no focal abnormalities noted Psych:  Normal affect   EKG:  The EKG was personally reviewed and demonstrates: ST depressions- thought to be rate related.   Laboratory Data:  High Sensitivity Troponin:   Recent Labs  Lab 01/02/23 1732 01/02/23 1955  TROPONINIHS 9 14     Chemistry Recent Labs  Lab 01/02/23 1732 01/02/23 1818  NA 140 139  K 4.3 4.9  CL 105 112*  CO2 22  --   GLUCOSE 104* 97  BUN 22 33*  CREATININE 1.21* 1.00  CALCIUM 9.5  --   GFRNONAA 46*  --   ANIONGAP 13  --     Recent Labs  Lab 01/02/23 1732  PROT 7.2  ALBUMIN 3.6  AST 29  ALT 35  ALKPHOS 88  BILITOT 0.7   Lipids  Recent Labs  Lab 01/02/23 1732  CHOL 179  TRIG 50  HDL 62  LDLCALC 107*  CHOLHDL 2.9    Hematology Recent Labs  Lab 01/02/23 1732 01/02/23 1818  WBC 10.3  --   RBC 4.43  --   HGB 13.1 14.6  HCT 39.7 43.0  MCV 89.6  --   MCH 29.6  --   MCHC 33.0  --   RDW 14.4  --   PLT 234  --     Thyroid No results for input(s): "TSH", "FREET4" in the last 168 hours.  BNPNo results for input(s): "BNP", "PROBNP" in the last 168 hours.  DDimer No results for input(s): "DDIMER" in the last 168 hours.   Radiology/Studies:  CT Angio Chest/Abd/Pel for Dissection W and/or W/WO  Result Date: 01/02/2023 CLINICAL DATA:  Chest pain, right pleural effusion EXAM: CT ANGIOGRAPHY CHEST, ABDOMEN AND PELVIS TECHNIQUE: Non-contrast CT of the chest was initially obtained. Multidetector CT imaging through the chest, abdomen and pelvis was performed using the standard protocol during bolus administration of intravenous contrast. Multiplanar reconstructed images and MIPs were obtained and reviewed to evaluate the vascular anatomy. RADIATION DOSE REDUCTION: This exam was performed according to the departmental dose-optimization program which includes automated exposure control, adjustment of the mA and/or kV according to patient size and/or use of iterative reconstruction technique. CONTRAST:  OMNIPAQUE IOHEXOL 350 MG/ML SOLN COMPARISON:  01/02/2023, 03/22/2021 FINDINGS: CTA CHEST FINDINGS Cardiovascular: Evaluation of the thoracic aorta is limited by patient motion during the exam. No evidence of thoracic aortic aneurysm or dissection. Dual lead pacemaker is seen within the heart. No pericardial effusion. Timing of contrast bolus limits evaluation of the pulmonary vasculature. Mediastinum/Nodes: Calcified mediastinal and hilar lymph nodes consistent with previous granulomatous disease. No pathologic adenopathy. Thyroid, trachea, and esophagus are unremarkable. Lungs/Pleura: Bilateral calcified granulomata are noted. Trace right pleural effusion. No airspace disease or pneumothorax. Central airways are patent. Musculoskeletal: No acute or destructive bony lesions. Reconstructed images demonstrate no additional findings. Review of the MIP images confirms the above findings. CTA ABDOMEN AND PELVIS FINDINGS VASCULAR  Aorta: Normal caliber aorta without aneurysm, dissection, vasculitis or significant stenosis. Celiac: Patent without evidence of aneurysm, dissection, vasculitis or significant stenosis. SMA: Patent without evidence of aneurysm, dissection, vasculitis or significant stenosis. Renals: No duplicated bilateral renal arteries. Bilateral renal arteries are patent, without evidence of aneurysm, dissection, vasculitis, or fibromuscular dysplasia. No critical stenosis is identified. IMA: Patent without evidence of aneurysm, dissection, vasculitis or significant stenosis. Inflow: Patent without evidence of aneurysm, dissection, vasculitis or significant stenosis. Veins: No obvious venous abnormality within the limitations of this arterial phase study. Review of the MIP images confirms the above findings. NON-VASCULAR Hepatobiliary: No focal liver abnormality is seen. Status post cholecystectomy. No biliary dilatation. Pancreas: Unremarkable. No pancreatic ductal dilatation or surrounding inflammatory changes. Spleen: Normal in size without focal abnormality. Adrenals/Urinary Tract: No urinary tract calculi or obstructive uropathy within either kidney. Kidneys enhance normally and symmetrically. The adrenals and bladder are unremarkable. Stomach/Bowel: No bowel obstruction or ileus. Normal appendix right lower quadrant. Diverticulosis of the distal  colon without diverticulitis. No bowel wall thickening or inflammatory change. Lymphatic: Multiple subcentimeter mesenteric lymph nodes are identified. Within the central mesentery there is a partially calcified soft tissue mass measuring 4.5 x 2.8 cm in transverse dimension, reference image 154/6, and extending approximately 6 cm in craniocaudal extent. Portions of the mass demonstrate a mildly spiculated margin, while other borders remain lobular. Differential diagnosis would include carcinoid tumor, calcifying fibrous tumor of the mesentery, treated lymphoma, or sequela of  previous infection or trauma. No previous imaging of the abdomen and pelvis is available for comparison. Reproductive: Status post hysterectomy. No adnexal masses. Other: No free fluid or free intraperitoneal gas. No abdominal wall hernia. Musculoskeletal: Right hip arthroplasty. No acute or destructive bony lesions. Reconstructed images demonstrate no additional findings. Review of the MIP images confirms the above findings. IMPRESSION: 1. No evidence of thoracoabdominal aortic aneurysm or dissection. 2. Trace right pleural effusion. 3. Indeterminate partially calcified central mesenteric mass. There is a wide differential diagnosis, which includes carcinoid tumor, calcified fibrous tumor of the mesentery, treated lymphoma, or sequela of previous infection or trauma. There is no previous abdominal imaging available for comparison. Please correlate with any known oncologic history. 4. Distal colonic diverticulosis without diverticulitis. 5.  Aortic Atherosclerosis (ICD10-I70.0). Electronically Signed   By: Sharlet Salina M.D.   On: 01/02/2023 18:51   DG Chest Port 1 View  Result Date: 01/02/2023 CLINICAL DATA:  Chest pain EXAM: PORTABLE CHEST 1 VIEW COMPARISON:  03/22/21 cxr FINDINGS: Left-sided dual lead cardiac device in place with leads in the right atrium and right ventricle. Status post median sternotomy. Unchanged cardiac and mediastinal contours. New small right-sided pleural effusion. No pneumothorax. No focal airspace opacity. No radiographically apparent displaced rib fractures. Visualized upper abdomen is unremarkable IMPRESSION: New small right-sided pleural effusion. Electronically Signed   By: Lorenza Cambridge M.D.   On: 01/02/2023 18:02     Assessment and Plan:   # CAD s/p CABG (LIMA to LAD) # Afib RVR # HTN Urgency # HFpEF # Moderate AS  -Patient presenting with CP and chest tightness. This could be due to extremely high BP at presentation SBP or Afib RVR (HR 130s/140s) or could actually  represent ACS. Troponin are normal which is reassuring -Trend troponin and EKG -Goal SBP 130- agree with nitroglycerin drip -HR have improved with BP being down as well. Will recommend starting low dose diltiazem drip as well to control HR -Echo in AM -If symptoms persist after control of HR/BP, will consider stress vs LHC during this admission -Continue aspirin -allergic to statin- consider zetia -Stop apixaban- start heparin while inpatient. -Device interrogation  For questions or updates, please contact [facility] Please consult [email or web address] for contact info under    Signed, Hermelinda Dellen, MD  01/02/2023 11:43 PM

## 2023-01-02 NOTE — ED Notes (Signed)
Pt back in room from CT scan. VSS

## 2023-01-02 NOTE — H&P (Incomplete)
PCP:   Mila Palmer, MD   Chief Complaint:  Chest pains  HPI: This is a 77 year old female with past medical history of CAD/CABG x1 2021, second-degree heart block sp PPM 2021, atrial fibrillation on anticoagulants, OSA on BiPAP, HLD, HTN, GERD, CKD stage III, hypothyroidism, anxiety and depression.  Per patient she has been losing energy for quite a while.  Approximately 6 months ago she was diagnosed with atrial fibrillation after PPM review, her meds changed, she states she has not felt optimized since.  She has had no episodes of A-fib with RVR to her knowledge.  Patient worked in yard and her feet was very swollen.  Yesterday she went to bed and developed indigestion, symptoms lasted all night, she took Tums and a single nitro without relief.  In the morning her chest pain intensified it was epigastric and located with some radiation to the left.  She continued it was indigestion.  The pain then started radiating to her back.  She denies heart palpitation, nausea, vomiting.  She states the pain was intermittent, at its worst 6/10.  Currently 0/10.  At 3:30 PM she went to urgent care.  At the urgent care she had an abnormal EKG and was transferred to [facility], ER.  At [facility] ER, patient's presenting blood pressure was 183/111, HR 147 irregular, RR and temperature normal.  Initial EKG SVT, which with slowed showed A-fib with RVR heart rate 120.  Code STEMI had been called, later canceled by cardiologist.  Patient placed on nitro drip, chest pain resolved.  Heart rate two low 100's without rate control medications.  Troponin 9 => 14.  Hospitalization requested.  CTA chest negative for dissection  History provided by patient.  Review of Systems:  The patient denies anorexia, fever, weight loss,, vision loss, decreased hearing, hoarseness, chest pain, syncope, dyspnea on exertion, peripheral edema, balance deficits, hemoptysis, abdominal pain, melena, hematochezia, severe  indigestion/heartburn, hematuria, incontinence, genital sores, muscle weakness, suspicious skin lesions, transient blindness, difficulty walking, depression, unusual weight change, abnormal bleeding, enlarged lymph nodes, angioedema, and breast masses. Positives: Chest pain  Past Medical History: Past Medical History:  Diagnosis Date   Anemia    pernicious   Anxiety    Arthritis    Asthma    Chronic diastolic CHF (congestive heart failure)    a. 02/2011 Echo: EF 55-60%, Gr2 DD, Mild MR   Chronic kidney disease    stage 3   Complication of anesthesia    Coronary artery disease    a. s/p CABG x 1 2010:  LIMA->LAD.;  b. amdx for CP => LHC 07/04/12: LAD 70-80%, mid RCA 30%, LIMA-LAD patent with competitive flow limiting distal LAD filling, EF 70% with hyperdynamic LV function. Medical therapy continued.   Depression    Dyslipidemia    Dysrhythmia    hx of Afib   GERD (gastroesophageal reflux disease)    Headache    hx of migraines   Heart murmur    History of hiatal hernia    History of kidney stones    Hyperlipidemia    Hypertension    Hyperthyroidism    Hypothyroidism    Mild aortic stenosis    Mitral regurgitation    a. mild by echo 02/2011.   Pneumonia    PONV (postoperative nausea and vomiting)    Presence of permanent cardiac pacemaker    Sleep apnea    diagnosed 08-2022   Past Surgical History:  Procedure Laterality Date   ABDOMINAL HYSTERECTOMY  1980   CARDIAC CATHETERIZATION  07/27/09 & 07/28/09   CESAREAN SECTION     CORONARY ARTERY BYPASS GRAFT  08/03/2009   x1 using left internal mammary artery to distal left anterior  descending coronary artery.    GALLBLADDER SURGERY  2001   LEFT HEART CATHETERIZATION WITH CORONARY/GRAFT ANGIOGRAM N/A 07/05/2012   Procedure: LEFT HEART CATHETERIZATION WITH Isabel Caprice;  Surgeon: Wendall Stade, MD;  Location: [facility] CATH LAB;  Service: Cardiovascular;  Laterality: N/A;   PACEMAKER IMPLANT N/A 03/17/2020    Procedure: PACEMAKER IMPLANT;  Surgeon: Duke Salvia, MD;  Location: [facility] INVASIVE CV LAB;  Service: Cardiovascular;  Laterality: N/A;   RIGHT/LEFT HEART CATH AND CORONARY/GRAFT ANGIOGRAPHY N/A 03/17/2020   Procedure: RIGHT/LEFT HEART CATH AND CORONARY/GRAFT ANGIOGRAPHY;  Surgeon: Iran Ouch, MD;  Location: [facility] INVASIVE CV LAB;  Service: Cardiovascular;  Laterality: N/A;   SHOULDER SURGERY  1998 / 2001   from accident   rotator cuff   TOTAL HIP ARTHROPLASTY Right 08/30/2022   Procedure: TOTAL HIP ARTHROPLASTY ANTERIOR APPROACH;  Surgeon: Ollen Gross, MD;  Location: [facility] ORS;  Service: Orthopedics;  Laterality: Right;   VESICOVAGINAL FISTULA CLOSURE W/ TAH  1998    Medications: Prior to Admission medications   Medication Sig Start Date End Date Taking? Authorizing Provider  acetaminophen (TYLENOL) 500 MG tablet Take 500 mg by mouth every 6 (six) hours as needed for moderate pain.    [provider]  apixaban (ELIQUIS) 5 MG TABS tablet Take 1 tablet (5 mg total) by mouth 2 (two) times daily. 07/12/22   Duke Salvia, MD  cholecalciferol (VITAMIN D3) 25 MCG (1000 UNIT) tablet Take 1,000 Units by mouth daily.    [provider]  cyanocobalamin (,VITAMIN B-12,) 1000 MCG/ML injection Inject 1,000 mcg into the muscle every 30 (thirty) days.    [provider]  diltiazem (CARDIZEM CD) 120 MG 24 hr capsule Take 1 capsule (120 mg total) by mouth daily. 07/12/22   Duke Salvia, MD  fluticasone [facility]) 50 MCG/ACT nasal spray Place 2 sprays into both nostrils daily as needed for allergies or rhinitis.    [provider]  furosemide (LASIX) 20 MG tablet Take 1 tablet by mouth daily. 04/12/20   [provider]  HYDROcodone-acetaminophen (NORCO/VICODIN) 5-325 MG tablet Take 1-2 tablets by mouth every 6 (six) hours as needed for severe pain. 09/01/22   Edmisten, Kristie L, PA  isosorbide mononitrate (IMDUR) 30 MG 24 hr tablet TAKE 1 TABLET AT BEDTIME.  PLEASE KEEP UPCOMING APPOINTMENT FOR FUTURE REFILLS. 04/24/22   Kathleene Hazel, MD  levothyroxine (SYNTHROID) 50 MCG tablet Take 50 mcg by mouth every morning. 04/19/20   [provider]  loratadine (CLARITIN) 10 MG tablet Take 10 mg by mouth daily.    [provider]  methocarbamol (ROBAXIN) 500 MG tablet Take 1 tablet (500 mg total) by mouth every 6 (six) hours as needed for muscle spasms. 09/01/22   Edmisten, Lyn Hollingshead, PA  Multiple Vitamin (MULTIVITAMIN WITH MINERALS) TABS tablet Take 1 tablet by mouth daily.    [provider]  Multiple Vitamins-Minerals (PRESERVISION AREDS 2) CAPS Take 1 capsule by mouth 2 (two) times daily.    [provider]  nitroGLYCERIN (NITROSTAT) 0.4 MG SL tablet PLACE 1 TABLET UNDER THE TONGUE EVERY 5 MINUTES AS NEEDED FOR CHEST PAIN 03/07/22   Kathleene Hazel, MD  Omega-3 Fatty Acids (FISH OIL PO) Take 1 capsule by mouth daily.    [provider]  traMADol (ULTRAM) 50 MG tablet Take 1-2 tablets (50-100 mg total) by mouth every 6 (six) hours as needed for moderate pain. 09/01/22   Edmisten, Lyn Hollingshead, PA  traZODone (DESYREL) 50 MG tablet Take 25 mg by mouth at bedtime. 08/06/22   [provider]    Allergies:   Allergies  Allergen Reactions   Ceclor [Cefaclor] Other (See Comments)    Lost vision in eye   Hctz [Hydrochlorothiazide] Other (See Comments)    Renal insufficiency   Lipitor [Atorvastatin Calcium] Other (See Comments)    Increased A1C   Norvasc [Amlodipine] Other (See Comments)    Makes patient stiff   Penicillins Hives, Shortness Of Breath, Itching and Rash    Has patient had a PCN reaction causing immediate rash, facial/tongue/throat swelling, SOB or lightheadedness with hypotension: Yes Has patient had a PCN reaction causing severe rash involving mucus membranes or skin necrosis: Unk Has patient had a PCN reaction that required hospitalization: No Has patient had a PCN reaction  occurring within the last 10 years: No If all of the above answers are "NO", then may proceed with Cephalosporin use.    Sulfa Antibiotics Other (See Comments)    CAUSES SHOCK   Statins Other (See Comments)    MYALGIAS AND WEAKNESS   Milk-Related Compounds Diarrhea and Nausea And Vomiting   Erythromycin Rash   Latex Rash   Levofloxacin Rash   Tetracyclines & Related Rash    Social History:  reports that she quit smoking about 12 years ago. Her smoking use included cigarettes. She has never used smokeless tobacco. She reports that she does not drink alcohol and does not use drugs.  Family History: Family History  Problem Relation Age of Onset   Cancer Father    Heart attack Father    High blood pressure Father    High Cholesterol Father    Heart disease Father    Alcoholism Father    Obesity Father    Heart attack Mother    Heart disease Mother        had pacemaker   High blood pressure Mother    High Cholesterol Mother    Obesity Mother    Cancer Brother    Heart disease Brother    Heart disease Sister    Cancer Sister    Angina Sister    Coronary artery disease Son    Hypertension Sister    Hypertension Brother    Thyroid disease Neg Hx     Physical Exam: Vitals:   01/02/23 1820 01/02/23 1836 01/02/23 1930 01/02/23 1945  BP:  (!) 142/87 (!) 161/93 (!) 151/72  Pulse:   (!) 47 (!) 112  Resp: 14  (!) 21 19  Temp:      TempSrc:      SpO2: 100%  100% 99%  Weight:      Height:        General:  Alert and oriented times three, well developed and nourished, no acute distress Eyes: PERRLA, pink conjunctiva, no scleral icterus ENT: Moist oral mucosa, neck supple, no thyromegaly Lungs: clear to ascultation, no wheeze, no crackles, no use of accessory muscles Cardiovascular: Tachycardic, irregular rate and irregular rhythm, no regurgitation, no gallops, no murmurs. No carotid bruits, no JVD Abdomen: soft, positive BS, non-tender, non-distended, no organomegaly, not  an acute abdomen GU: not examined Neuro: CN II - XII grossly intact, sensation intact Musculoskeletal: strength 5/5 all extremities, no clubbing, cyanosis or edema Skin: no rash, no subcutaneous crepitation, no  decubitus Psych: appropriate patient   Labs on Admission:  Recent Labs    01/02/23 1732 01/02/23 1818  NA 140 139  K 4.3 4.9  CL 105 112*  CO2 22  --   GLUCOSE 104* 97  BUN 22 33*  CREATININE 1.21* 1.00  CALCIUM 9.5  --    Recent Labs    01/02/23 1732  AST 29  ALT 35  ALKPHOS 88  BILITOT 0.7  PROT 7.2  ALBUMIN 3.6    Recent Labs    01/02/23 1732 01/02/23 1818  WBC 10.3  --   NEUTROABS 7.5  --   HGB 13.1 14.6  HCT 39.7 43.0  MCV 89.6  --   PLT 234  --     Recent Labs    01/02/23 1732  CHOL 179  HDL 62  LDLCALC 107*  TRIG 50  CHOLHDL 2.9      Radiological Exams on Admission: CT Angio Chest/Abd/Pel for Dissection W and/or W/WO  Result Date: 01/02/2023 CLINICAL DATA:  Chest pain, right pleural effusion EXAM: CT ANGIOGRAPHY CHEST, ABDOMEN AND PELVIS TECHNIQUE: Non-contrast CT of the chest was initially obtained. Multidetector CT imaging through the chest, abdomen and pelvis was performed using the standard protocol during bolus administration of intravenous contrast. Multiplanar reconstructed images and MIPs were obtained and reviewed to evaluate the vascular anatomy. RADIATION DOSE REDUCTION: This exam was performed according to the departmental dose-optimization program which includes automated exposure control, adjustment of the mA and/or kV according to patient size and/or use of iterative reconstruction technique. CONTRAST:  OMNIPAQUE IOHEXOL 350 MG/ML SOLN COMPARISON:  01/02/2023, 03/22/2021 FINDINGS: CTA CHEST FINDINGS Cardiovascular: Evaluation of the thoracic aorta is limited by patient motion during the exam. No evidence of thoracic aortic aneurysm or dissection. Dual lead pacemaker is seen within the heart. No pericardial effusion. Timing  of contrast bolus limits evaluation of the pulmonary vasculature. Mediastinum/Nodes: Calcified mediastinal and hilar lymph nodes consistent with previous granulomatous disease. No pathologic adenopathy. Thyroid, trachea, and esophagus are unremarkable. Lungs/Pleura: Bilateral calcified granulomata are noted. Trace right pleural effusion. No airspace disease or pneumothorax. Central airways are patent. Musculoskeletal: No acute or destructive bony lesions. Reconstructed images demonstrate no additional findings. Review of the MIP images confirms the above findings. CTA ABDOMEN AND PELVIS FINDINGS VASCULAR Aorta: Normal caliber aorta without aneurysm, dissection, vasculitis or significant stenosis. Celiac: Patent without evidence of aneurysm, dissection, vasculitis or significant stenosis. SMA: Patent without evidence of aneurysm, dissection, vasculitis or significant stenosis. Renals: No duplicated bilateral renal arteries. Bilateral renal arteries are patent, without evidence of aneurysm, dissection, vasculitis, or fibromuscular dysplasia. No critical stenosis is identified. IMA: Patent without evidence of aneurysm, dissection, vasculitis or significant stenosis. Inflow: Patent without evidence of aneurysm, dissection, vasculitis or significant stenosis. Veins: No obvious venous abnormality within the limitations of this arterial phase study. Review of the MIP images confirms the above findings. NON-VASCULAR Hepatobiliary: No focal liver abnormality is seen. Status post cholecystectomy. No biliary dilatation. Pancreas: Unremarkable. No pancreatic ductal dilatation or surrounding inflammatory changes. Spleen: Normal in size without focal abnormality. Adrenals/Urinary Tract: No urinary tract calculi or obstructive uropathy within either kidney. Kidneys enhance normally and symmetrically. The adrenals and bladder are unremarkable. Stomach/Bowel: No bowel obstruction or ileus. Normal appendix right lower quadrant.  Diverticulosis of the distal colon without diverticulitis. No bowel wall thickening or inflammatory change. Lymphatic: Multiple subcentimeter mesenteric lymph nodes are identified. Within the central mesentery there is a partially calcified soft tissue mass measuring 4.5 x 2.8 cm  in transverse dimension, reference image 154/6, and extending approximately 6 cm in craniocaudal extent. Portions of the mass demonstrate a mildly spiculated margin, while other borders remain lobular. Differential diagnosis would include carcinoid tumor, calcifying fibrous tumor of the mesentery, treated lymphoma, or sequela of previous infection or trauma. No previous imaging of the abdomen and pelvis is available for comparison. Reproductive: Status post hysterectomy. No adnexal masses. Other: No free fluid or free intraperitoneal gas. No abdominal wall hernia. Musculoskeletal: Right hip arthroplasty. No acute or destructive bony lesions. Reconstructed images demonstrate no additional findings. Review of the MIP images confirms the above findings. IMPRESSION: 1. No evidence of thoracoabdominal aortic aneurysm or dissection. 2. Trace right pleural effusion. 3. Indeterminate partially calcified central mesenteric mass. There is a wide differential diagnosis, which includes carcinoid tumor, calcified fibrous tumor of the mesentery, treated lymphoma, or sequela of previous infection or trauma. There is no previous abdominal imaging available for comparison. Please correlate with any known oncologic history. 4. Distal colonic diverticulosis without diverticulitis. 5.  Aortic Atherosclerosis (ICD10-I70.0). Electronically Signed   By: Sharlet Salina M.D.   On: 01/02/2023 18:51   DG Chest Port 1 View  Result Date: 01/02/2023 CLINICAL DATA:  Chest pain EXAM: PORTABLE CHEST 1 VIEW COMPARISON:  03/22/21 cxr FINDINGS: Left-sided dual lead cardiac device in place with leads in the right atrium and right ventricle. Status post median sternotomy.  Unchanged cardiac and mediastinal contours. New small right-sided pleural effusion. No pneumothorax. No focal airspace opacity. No radiographically apparent displaced rib fractures. Visualized upper abdomen is unremarkable IMPRESSION: New small right-sided pleural effusion. Electronically Signed   By: Lorenza Cambridge M.D.   On: 01/02/2023 18:02    Assessment/Plan Present on Admission:  Atrial fibrillation w/ RVR/ chest pains -Patient's home dose Cardizem 120 mg daily given.  Current heart rate 94 -Metoprolol 25 mg p.o. twice daily added -Hold Eliquis, heparin drip -Cardiology consult placed,  -PPM interrogation in a.m. per primary team/cardiology  Hypertensive Urgency -Continue nitro drip -Home medications Cardizem resumed -Resume Imdur after nitro drip has been titrated off -As needed blood pressure medications ordered   Indeterminate partially calcified central mesenteric mass. -Patient without symptoms of carcinoid.  No flushing, no diarrhea, bronchospasms -Patient without history of lymphoma or trauma.  Patient with history of fibroid tumors leading to hysterectomy. -Discussed with patient biopsy vs repeat imaging in 2 or 3 months.  Patient discussed with family.  We discussed in a.m. -Will hold Eliquis, heparin initiated   Hyperlipidemia -Patient is statin intolerant.  Omega-3 fatty acids resumed   Coronary artery disease  Chronic diastolic CHF (congestive heart failure) -See above   Hypothyroidism -Synthroid resumed   Samantha Short 01/02/2023, 8:08 PM

## 2023-01-02 NOTE — ED Triage Notes (Signed)
Pt here for severe mid sternal CP intermittent since last night; pt noted to be tachycardic with significant cardiac hx; PA notified

## 2023-01-02 NOTE — ED Notes (Signed)
ED Provider at bedside. 

## 2023-01-02 NOTE — ED Notes (Signed)
Pt off unit to CT scan with RN. No distress noted at this time.

## 2023-01-02 NOTE — ED Notes (Signed)
Cardiology provider at bedside

## 2023-01-02 NOTE — ED Provider Notes (Signed)
I provided a substantive portion of the care of this patient.  I personally made/approved the management plan for this patient and take responsibility for the patient management. {Remember to document shared critical care using "edcritical" dot phrase:1} EKG Interpretation  Date/Time:  Tuesday January 02 2023 17:33:05 EDT Ventricular Rate:  120 PR Interval:    QRS Duration: 125 QT Interval:  365 QTC Calculation: 516 R Axis:   99 Text Interpretation: Atrial fibrillation RBBB and LPFB Repol abnrm suggests ischemia, diffuse leads diffuse schemic appearance. new since prevous tracings Confirmed by Arby Barrette [phone]) on 01/02/2023 5:39:16 PM   Reports that she developed chest pain at approximately 11 PM last night.  She reports she struggles with wearing a nighttime CPAP and could not get comfortable.  At 11 PM she started getting central chest pain with radiation through to her shoulders.  It waxed and waned in severity but kept her up all night long.  Symptoms persisted and worsened this morning.  Patient is alert.  Mental status clear.  No respiratory distress at rest.  Significant tachycardia.  At this time cannot appreciate rub or gallop.  Lungs some mild crackles at bases.  Grossly clear.

## 2023-01-02 NOTE — ED Notes (Signed)
Patient is being discharged from the Urgent Care and sent to the Emergency Department via EMS . Per ER, patient is in need of higher level of care due to CP/tachycardia. Patient is aware and verbalizes understanding of plan of care.  Vitals:   01/02/23 1623  BP: (!) 183/98  Pulse: (!) 147  Resp: 18  Temp: 97.6 F (36.4 C)  SpO2: 93%

## 2023-01-03 DIAGNOSIS — I350 Nonrheumatic aortic (valve) stenosis: Secondary | ICD-10-CM | POA: Diagnosis not present

## 2023-01-03 DIAGNOSIS — I160 Hypertensive urgency: Secondary | ICD-10-CM | POA: Diagnosis not present

## 2023-01-03 DIAGNOSIS — I4891 Unspecified atrial fibrillation: Secondary | ICD-10-CM

## 2023-01-03 LAB — BASIC METABOLIC PANEL
Anion gap: 10 (ref 5–15)
BUN: 20 mg/dL (ref 8–23)
CO2: 22 mmol/L (ref 22–32)
Calcium: 9.4 mg/dL (ref 8.9–10.3)
Chloride: 105 mmol/L (ref 98–111)
Creatinine, Ser: 1.21 mg/dL — ABNORMAL HIGH (ref 0.44–1.00)
GFR, Estimated: 46 mL/min — ABNORMAL LOW (ref >60)
Glucose, Bld: 101 mg/dL — ABNORMAL HIGH (ref 70–99)
Potassium: 4.1 mmol/L (ref 3.5–5.1)
Sodium: 137 mmol/L (ref 135–145)

## 2023-01-03 LAB — CBC
HCT: 40.7 % (ref 36.0–46.0)
Hemoglobin: 12.9 g/dL (ref 12.0–15.0)
MCH: 28.9 pg (ref 26.0–34.0)
MCHC: 31.7 g/dL (ref 30.0–36.0)
MCV: 91.1 fL (ref 80.0–100.0)
Platelets: 261 10*3/uL (ref 150–400)
RBC: 4.47 MIL/uL (ref 3.87–5.11)
RDW: 14.5 % (ref 11.5–15.5)
WBC: 10.2 10*3/uL (ref 4.0–10.5)
nRBC: 0 % (ref 0.0–0.2)

## 2023-01-03 LAB — HEMOGLOBIN A1C
Hgb A1c MFr Bld: 5.7 % — ABNORMAL HIGH (ref 4.8–5.6)
Mean Plasma Glucose: 117 mg/dL

## 2023-01-03 LAB — TROPONIN I (HIGH SENSITIVITY)
Troponin I (High Sensitivity): 19 ng/L — ABNORMAL HIGH (ref <18)
Troponin I (High Sensitivity): 16 ng/L (ref <18)

## 2023-01-03 MED ORDER — APIXABAN 5 MG PO TABS
5.0000 mg | ORAL_TABLET | Freq: Two times a day (BID) | ORAL | Status: DC
  Administered 2023-01-03 – 2023-01-05 (×5): 5 mg via ORAL
  Filled 2023-01-03 (×5): qty 1

## 2023-01-03 MED ORDER — HEPARIN (PORCINE) 25000 UT/250ML-% IV SOLN
1000.0000 [IU]/h | INTRAVENOUS | Status: DC
  Administered 2023-01-03: 1000 [IU]/h via INTRAVENOUS
  Filled 2023-01-03: qty 250

## 2023-01-03 MED ORDER — DILTIAZEM HCL-DEXTROSE 125-5 MG/125ML-% IV SOLN (PREMIX)
5.0000 mg/h | INTRAVENOUS | Status: DC
  Administered 2023-01-03: 5 mg/h via INTRAVENOUS
  Filled 2023-01-03: qty 125

## 2023-01-03 MED ORDER — METOPROLOL TARTRATE 25 MG PO TABS
25.0000 mg | ORAL_TABLET | Freq: Two times a day (BID) | ORAL | Status: DC
  Administered 2023-01-03 (×3): 25 mg via ORAL
  Filled 2023-01-03 (×3): qty 1

## 2023-01-03 MED ORDER — ASPIRIN 81 MG PO TBEC
81.0000 mg | DELAYED_RELEASE_TABLET | Freq: Every day | ORAL | Status: DC
  Administered 2023-01-03: 81 mg via ORAL
  Filled 2023-01-03: qty 1

## 2023-01-03 NOTE — Progress Notes (Addendum)
PROGRESS NOTE    Samantha Short  ZOX:[id number] DOB: May 25, 1946 DOA: 01/02/2023 PCP: Mila Palmer, MD  77 y.o. female with history of CAD s/p 1V CABG in 2010, HTN, HLD, GERD, mitral regurgitation, chronic diastolic CHF, atrial fibrillation and high grade AV block s/p permanent pacemaker placement admitted with chest pain, hypertensive urgency and rapid atrial fibrillation.    Subjective: Feels better, denies any chest pain or dyspnea today  Assessment and Plan:  A-fib with RVR -Converted to sinus rhythm -On IV Cardizem, could restart oral Cardizem today, resume Eliquis if no further cardiac workup planned -Cards following, resume oral metoprolol  Acute on chronic diastolic CHF -Likely triggered by A-fib RVR, volume status has improved -sp IV Lasix overnight, urine output not recorded in the ED -Now changed to oral Lasix, follow-up echo today  Chest pain/pressure -Likely secondary to above, no ACS, follow-up echo  Hypertensive urgency -Resolved, dc nitro gtt  Moderate aortic stenosis -Follow-up repeat echo today  Indeterminant partially calcified central mesenteric mass -No prior history, no symptoms of carcinoid -discussed with [facility], recommended EGD/Colonoscopy as outpt with Surgical Onc FU   DVT prophylaxis: Apixaban Code Status: Full code Family Communication: None present Disposition Plan: Home likely tomorrow  Consultants:    Procedures:   Antimicrobials:    Objective: Vitals:   01/03/23 1200 01/03/23 1215 01/03/23 1230 01/03/23 1245  BP: 131/70 (!) 128/106 124/76 120/76  Pulse: 71 70 67 64  Resp: 19 16 (!) 22 20  Temp:      TempSrc:      SpO2: 99% (!) 89% 94% (!) 76%  Weight:      Height:       No intake or output data in the 24 hours ending 01/03/23 1318 Filed Weights   01/02/23 1728  Weight: 90 kg    Examination:  General exam: Appears calm and comfortable  Respiratory system: Decreased breath sounds at the bases Cardiovascular  system: S1 & S2 heard, RRR.  Abd: nondistended, soft and nontender.Normal bowel sounds heard. Central nervous system: Alert and oriented. No focal neurological deficits. Extremities: no edema Skin: No rashes Psychiatry:  Mood & affect appropriate.     Data Reviewed:   CBC: Recent Labs  Lab 01/02/23 1732 01/02/23 1818 01/03/23 0050  WBC 10.3  --  10.2  NEUTROABS 7.5  --   --   HGB 13.1 14.6 12.9  HCT 39.7 43.0 40.7  MCV 89.6  --  91.1  PLT 234  --  261   Basic Metabolic Panel: Recent Labs  Lab 01/02/23 1732 01/02/23 1818 01/03/23 0050  NA 140 139 137  K 4.3 4.9 4.1  CL 105 112* 105  CO2 22  --  22  GLUCOSE 104* 97 101*  BUN 22 33* 20  CREATININE 1.21* 1.00 1.21*  CALCIUM 9.5  --  9.4   GFR: Estimated Creatinine Clearance: 42.1 mL/min (A) (by C-G formula based on SCr of 1.21 mg/dL (H)). Liver Function Tests: Recent Labs  Lab 01/02/23 1732  AST 29  ALT 35  ALKPHOS 88  BILITOT 0.7  PROT 7.2  ALBUMIN 3.6   No results for input(s): "LIPASE", "AMYLASE" in the last 168 hours. No results for input(s): "AMMONIA" in the last 168 hours. Coagulation Profile: Recent Labs  Lab 01/02/23 1732  INR 1.5*   Cardiac Enzymes: No results for input(s): "CKTOTAL", "CKMB", "CKMBINDEX", "TROPONINI" in the last 168 hours. BNP (last 3 results) No results for input(s): "PROBNP" in the last 8760 hours. HbA1C: Recent Labs  01/02/23 1732  HGBA1C 5.7*   CBG: No results for input(s): "GLUCAP" in the last 168 hours. Lipid Profile: Recent Labs    01/02/23 1732  CHOL 179  HDL 62  LDLCALC 107*  TRIG 50  CHOLHDL 2.9   Thyroid Function Tests: No results for input(s): "TSH", "T4TOTAL", "FREET4", "T3FREE", "THYROIDAB" in the last 72 hours. Anemia Panel: No results for input(s): "VITAMINB12", "FOLATE", "FERRITIN", "TIBC", "IRON", "RETICCTPCT" in the last 72 hours. Urine analysis:    Component Value Date/Time   COLORURINE YELLOW 05/23/2018 2226   APPEARANCEUR HAZY (A)  05/23/2018 2226   LABSPEC 1.014 05/23/2018 2226   PHURINE 5.0 05/23/2018 2226   GLUCOSEU NEGATIVE 05/23/2018 2226   HGBUR NEGATIVE 05/23/2018 2226   BILIRUBINUR NEGATIVE 05/23/2018 2226   KETONESUR NEGATIVE 05/23/2018 2226   PROTEINUR NEGATIVE 05/23/2018 2226   UROBILINOGEN 0.2 01/13/2015 1545   NITRITE NEGATIVE 05/23/2018 2226   LEUKOCYTESUR LARGE (A) 05/23/2018 2226   Sepsis Labs: @LABRCNTIP (procalcitonin:4,lacticidven:4)  )No results found for this or any previous visit (from the past 240 hour(s)).   Radiology Studies: CT Angio Chest/Abd/Pel for Dissection W and/or W/WO  Result Date: 01/02/2023 CLINICAL DATA:  Chest pain, right pleural effusion EXAM: CT ANGIOGRAPHY CHEST, ABDOMEN AND PELVIS TECHNIQUE: Non-contrast CT of the chest was initially obtained. Multidetector CT imaging through the chest, abdomen and pelvis was performed using the standard protocol during bolus administration of intravenous contrast. Multiplanar reconstructed images and MIPs were obtained and reviewed to evaluate the vascular anatomy. RADIATION DOSE REDUCTION: This exam was performed according to the departmental dose-optimization program which includes automated exposure control, adjustment of the mA and/or kV according to patient size and/or use of iterative reconstruction technique. CONTRAST:  OMNIPAQUE IOHEXOL 350 MG/ML SOLN COMPARISON:  01/02/2023, 03/22/2021 FINDINGS: CTA CHEST FINDINGS Cardiovascular: Evaluation of the thoracic aorta is limited by patient motion during the exam. No evidence of thoracic aortic aneurysm or dissection. Dual lead pacemaker is seen within the heart. No pericardial effusion. Timing of contrast bolus limits evaluation of the pulmonary vasculature. Mediastinum/Nodes: Calcified mediastinal and hilar lymph nodes consistent with previous granulomatous disease. No pathologic adenopathy. Thyroid, trachea, and esophagus are unremarkable. Lungs/Pleura: Bilateral calcified granulomata are  noted. Trace right pleural effusion. No airspace disease or pneumothorax. Central airways are patent. Musculoskeletal: No acute or destructive bony lesions. Reconstructed images demonstrate no additional findings. Review of the MIP images confirms the above findings. CTA ABDOMEN AND PELVIS FINDINGS VASCULAR Aorta: Normal caliber aorta without aneurysm, dissection, vasculitis or significant stenosis. Celiac: Patent without evidence of aneurysm, dissection, vasculitis or significant stenosis. SMA: Patent without evidence of aneurysm, dissection, vasculitis or significant stenosis. Renals: No duplicated bilateral renal arteries. Bilateral renal arteries are patent, without evidence of aneurysm, dissection, vasculitis, or fibromuscular dysplasia. No critical stenosis is identified. IMA: Patent without evidence of aneurysm, dissection, vasculitis or significant stenosis. Inflow: Patent without evidence of aneurysm, dissection, vasculitis or significant stenosis. Veins: No obvious venous abnormality within the limitations of this arterial phase study. Review of the MIP images confirms the above findings. NON-VASCULAR Hepatobiliary: No focal liver abnormality is seen. Status post cholecystectomy. No biliary dilatation. Pancreas: Unremarkable. No pancreatic ductal dilatation or surrounding inflammatory changes. Spleen: Normal in size without focal abnormality. Adrenals/Urinary Tract: No urinary tract calculi or obstructive uropathy within either kidney. Kidneys enhance normally and symmetrically. The adrenals and bladder are unremarkable. Stomach/Bowel: No bowel obstruction or ileus. Normal appendix right lower quadrant. Diverticulosis of the distal colon without diverticulitis. No bowel wall thickening or inflammatory change. Lymphatic:  Multiple subcentimeter mesenteric lymph nodes are identified. Within the central mesentery there is a partially calcified soft tissue mass measuring 4.5 x 2.8 cm in transverse dimension,  reference image 154/6, and extending approximately 6 cm in craniocaudal extent. Portions of the mass demonstrate a mildly spiculated margin, while other borders remain lobular. Differential diagnosis would include carcinoid tumor, calcifying fibrous tumor of the mesentery, treated lymphoma, or sequela of previous infection or trauma. No previous imaging of the abdomen and pelvis is available for comparison. Reproductive: Status post hysterectomy. No adnexal masses. Other: No free fluid or free intraperitoneal gas. No abdominal wall hernia. Musculoskeletal: Right hip arthroplasty. No acute or destructive bony lesions. Reconstructed images demonstrate no additional findings. Review of the MIP images confirms the above findings. IMPRESSION: 1. No evidence of thoracoabdominal aortic aneurysm or dissection. 2. Trace right pleural effusion. 3. Indeterminate partially calcified central mesenteric mass. There is a wide differential diagnosis, which includes carcinoid tumor, calcified fibrous tumor of the mesentery, treated lymphoma, or sequela of previous infection or trauma. There is no previous abdominal imaging available for comparison. Please correlate with any known oncologic history. 4. Distal colonic diverticulosis without diverticulitis. 5.  Aortic Atherosclerosis (ICD10-I70.0). Electronically Signed   By: Sharlet Salina M.D.   On: 01/02/2023 18:51   DG Chest Port 1 View  Result Date: 01/02/2023 CLINICAL DATA:  Chest pain EXAM: PORTABLE CHEST 1 VIEW COMPARISON:  03/22/21 cxr FINDINGS: Left-sided dual lead cardiac device in place with leads in the right atrium and right ventricle. Status post median sternotomy. Unchanged cardiac and mediastinal contours. New small right-sided pleural effusion. No pneumothorax. No focal airspace opacity. No radiographically apparent displaced rib fractures. Visualized upper abdomen is unremarkable IMPRESSION: New small right-sided pleural effusion. Electronically Signed   By:  Lorenza Cambridge M.D.   On: 01/02/2023 18:02     Scheduled Meds:  apixaban  5 mg Oral BID   aspirin EC  81 mg Oral Daily   diltiazem  120 mg Oral Daily   furosemide  20 mg Oral Daily   levothyroxine  50 mcg Oral q morning   metoprolol tartrate  25 mg Oral BID   Continuous Infusions:  sodium chloride Stopped (01/03/23 0246)   nitroGLYCERIN Stopped (01/03/23 0246)     LOS: 1 day    Time spent:    Zannie Cove, MD [facility]   01/03/2023, 1:18 PM

## 2023-01-03 NOTE — Plan of Care (Signed)

## 2023-01-03 NOTE — ED Notes (Signed)
ED TO INPATIENT HANDOFF REPORT  ED Nurse Name and Phone #: Braxtyn Bojarski [phone]  S Name/Age/Gender Samantha Short 77 y.o. female Room/Bed: 033C/033C  Code Status   Code Status: Full Code  Home/SNF/Other Home Patient oriented to: self, place, time, and situation Is this baseline? Yes   Triage Complete: Triage complete  Chief Complaint Hypertensive urgency [I16.0]  Triage Note No notes on file   Allergies Allergies  Allergen Reactions   Ceclor [Cefaclor] Other (See Comments)    Lost vision in eye   Hctz [Hydrochlorothiazide] Other (See Comments)    Renal insufficiency   Lipitor [Atorvastatin Calcium] Other (See Comments)    Increased A1C   Norvasc [Amlodipine] Other (See Comments)    Makes patient stiff   Penicillins Hives, Shortness Of Breath, Itching and Rash   Sulfa Antibiotics Other (See Comments)    CAUSES SHOCK   Statins Other (See Comments)    MYALGIAS AND WEAKNESS   Milk-Related Compounds Diarrhea and Nausea And Vomiting   Erythromycin Rash   Latex Rash   Levofloxacin Rash   Tetracyclines & Related Rash    Level of Care/Admitting Diagnosis ED Disposition     ED Disposition  Admit   Condition  --   Comment  Hospital Area: [facility] [[id number]]  Level of Care: Progressive [102]  Admit to Progressive based on following criteria: CARDIOVASCULAR & THORACIC of moderate stability with acute coronary syndrome symptoms/low risk myocardial infarction/hypertensive urgency/arrhythmias/heart failure potentially compromising stability and stable post cardiovascular intervention patients.  May admit patient to [facility] or [facility] if equivalent level of care is available:: No  Covid Evaluation: Confirmed COVID Negative  Diagnosis: Hypertensive urgency [147829]  Admitting Physician: Gery Pray [[id number]]  Attending Physician: Gery Pray [[id number]]  Certification:: I certify this patient will need inpatient services for at least 2 midnights   Estimated Length of Stay: 2          B Medical/Surgery History Past Medical History:  Diagnosis Date   Anemia    pernicious   Anxiety    Arthritis    Asthma    Chronic diastolic CHF (congestive heart failure)    a. 02/2011 Echo: EF 55-60%, Gr2 DD, Mild MR   Chronic kidney disease    stage 3   Complication of anesthesia    Coronary artery disease    a. s/p CABG x 1 2010:  LIMA->LAD.;  b. amdx for CP => LHC 07/04/12: LAD 70-80%, mid RCA 30%, LIMA-LAD patent with competitive flow limiting distal LAD filling, EF 70% with hyperdynamic LV function. Medical therapy continued.   Depression    Dyslipidemia    Dysrhythmia    hx of Afib   GERD (gastroesophageal reflux disease)    Headache    hx of migraines   Heart murmur    History of hiatal hernia    History of kidney stones    Hyperlipidemia    Hypertension    Hyperthyroidism    Hypothyroidism    Mild aortic stenosis    Mitral regurgitation    a. mild by echo 02/2011.   Pneumonia    PONV (postoperative nausea and vomiting)    Presence of permanent cardiac pacemaker    Sleep apnea    diagnosed 08-2022   Past Surgical History:  Procedure Laterality Date   ABDOMINAL HYSTERECTOMY  1980   CARDIAC CATHETERIZATION  07/27/09 & 07/28/09   CESAREAN SECTION     CORONARY ARTERY BYPASS GRAFT  08/03/2009   x1 using left  internal mammary artery to distal left anterior  descending coronary artery.    GALLBLADDER SURGERY  2001   LEFT HEART CATHETERIZATION WITH CORONARY/GRAFT ANGIOGRAM N/A 07/05/2012   Procedure: LEFT HEART CATHETERIZATION WITH Isabel Caprice;  Surgeon: Wendall Stade, MD;  Location: [facility] CATH LAB;  Service: Cardiovascular;  Laterality: N/A;   PACEMAKER IMPLANT N/A 03/17/2020   Procedure: PACEMAKER IMPLANT;  Surgeon: Duke Salvia, MD;  Location: [facility] INVASIVE CV LAB;  Service: Cardiovascular;  Laterality: N/A;   RIGHT/LEFT HEART CATH AND CORONARY/GRAFT ANGIOGRAPHY N/A 03/17/2020   Procedure: RIGHT/LEFT HEART  CATH AND CORONARY/GRAFT ANGIOGRAPHY;  Surgeon: Iran Ouch, MD;  Location: [facility] INVASIVE CV LAB;  Service: Cardiovascular;  Laterality: N/A;   SHOULDER SURGERY  1998 / 2001   from accident   rotator cuff   TOTAL HIP ARTHROPLASTY Right 08/30/2022   Procedure: TOTAL HIP ARTHROPLASTY ANTERIOR APPROACH;  Surgeon: Ollen Gross, MD;  Location: [facility] ORS;  Service: Orthopedics;  Laterality: Right;   VESICOVAGINAL FISTULA CLOSURE W/ TAH  1998     A IV Location/Drains/Wounds Patient Lines/Drains/Airways Status     Active Line/Drains/Airways     Name Placement date Placement time Site Days   Peripheral IV 01/02/23 18 G Anterior;Proximal;Right Forearm 01/02/23  1640  Forearm  1   Peripheral IV 01/02/23 20 G Left Antecubital 01/02/23  1803  Antecubital  1   External Urinary Catheter 01/03/23  0103  --  less than 1            Intake/Output Last 24 hours No intake or output data in the 24 hours ending 01/03/23 1216  Labs/Imaging Results for orders placed or performed during the hospital encounter of 01/02/23 (from the past 48 hour(s))  Hemoglobin A1c     Status: Abnormal   Collection Time: 01/02/23  5:32 PM  Result Value Ref Range   Hgb A1c MFr Bld 5.7 (H) 4.8 - 5.6 %    Comment: (NOTE)         Prediabetes: 5.7 - 6.4         Diabetes: >6.4         Glycemic control for adults with diabetes: <7.0    Mean Plasma Glucose 117 mg/dL    Comment: (NOTE) Performed At: [facility] [employer] [address] [place], [place] [id number] Jolene Schimke MD WU:[id number]   CBC with Differential/Platelet     Status: None   Collection Time: 01/02/23  5:32 PM  Result Value Ref Range   WBC 10.3 4.0 - 10.5 K/uL   RBC 4.43 3.87 - 5.11 MIL/uL   Hemoglobin 13.1 12.0 - 15.0 g/dL   HCT 95.6 21.3 - 08.6 %   MCV 89.6 80.0 - 100.0 fL   MCH 29.6 26.0 - 34.0 pg   MCHC 33.0 30.0 - 36.0 g/dL   RDW 57.8 46.9 - 62.9 %   Platelets 234 150 - 400 K/uL   nRBC 0.0 0.0 - 0.2 %   Neutrophils Relative % 73 %    Neutro Abs 7.5 1.7 - 7.7 K/uL   Lymphocytes Relative 17 %   Lymphs Abs 1.8 0.7 - 4.0 K/uL   Monocytes Relative 8 %   Monocytes Absolute 0.8 0.1 - 1.0 K/uL   Eosinophils Relative 1 %   Eosinophils Absolute 0.1 0.0 - 0.5 K/uL   Basophils Relative 1 %   Basophils Absolute 0.1 0.0 - 0.1 K/uL   Immature Granulocytes 0 %   Abs Immature Granulocytes 0.02 0.00 - 0.07 K/uL    Comment:  Performed at [facility], [address] [place], [place] [address]  Protime-INR     Status: Abnormal   Collection Time: 01/02/23  5:32 PM  Result Value Ref Range   Prothrombin Time 18.1 (H) 11.4 - 15.2 seconds   INR 1.5 (H) 0.8 - 1.2    Comment: (NOTE) INR goal varies based on device and disease states. Performed at [facility], [address] [place], [place] [address]   APTT     Status: None   Collection Time: 01/02/23  5:32 PM  Result Value Ref Range   aPTT 33 24 - 36 seconds    Comment: Performed at [facility], [address] [place], [place] [address]  Comprehensive metabolic panel     Status: Abnormal   Collection Time: 01/02/23  5:32 PM  Result Value Ref Range   Sodium 140 135 - 145 mmol/L   Potassium 4.3 3.5 - 5.1 mmol/L   Chloride 105 98 - 111 mmol/L   CO2 22 22 - 32 mmol/L   Glucose, Bld 104 (H) 70 - 99 mg/dL    Comment: Glucose reference range applies only to samples taken after fasting for at least 8 hours.   BUN 22 8 - 23 mg/dL   Creatinine, Ser 0.27 (H) 0.44 - 1.00 mg/dL   Calcium 9.5 8.9 - 25.3 mg/dL   Total Protein 7.2 6.5 - 8.1 g/dL   Albumin 3.6 3.5 - 5.0 g/dL   AST 29 15 - 41 U/L   ALT 35 0 - 44 U/L   Alkaline Phosphatase 88 38 - 126 U/L   Total Bilirubin 0.7 0.3 - 1.2 mg/dL   GFR, Estimated 46 (L) >60 mL/min    Comment: (NOTE) Calculated using the CKD-EPI Creatinine Equation (2021)    Anion gap 13 5 - 15    Comment: Performed at [facility], [address] [place], [place] [address]  Troponin I (High Sensitivity)     Status: None    Collection Time: 01/02/23  5:32 PM  Result Value Ref Range   Troponin I (High Sensitivity) 9 <18 ng/L    Comment: (NOTE) Elevated high sensitivity troponin I (hsTnI) values and significant  changes across serial measurements may suggest ACS but many other  chronic and acute conditions are known to elevate hsTnI results.  Refer to the "Links" section for chest pain algorithms and additional  guidance. Performed at [facility], [address], [place], [place] [address]   Lipid panel     Status: Abnormal   Collection Time: 01/02/23  5:32 PM  Result Value Ref Range   Cholesterol 179 0 - 200 mg/dL   Triglycerides 50 <595 mg/dL   HDL 62 >63 mg/dL   Total CHOL/HDL Ratio 2.9 RATIO   VLDL 10 0 - 40 mg/dL   LDL Cholesterol 875 (H) 0 - 99 mg/dL    Comment:        Total Cholesterol/HDL:CHD Risk Coronary Heart Disease Risk Table                     Men   Women  1/2 Average Risk   3.4   3.3  Average Risk       5.0   4.4  2 X Average Risk   9.6   7.1  3 X Average Risk  23.4   11.0        Use the calculated Patient Ratio above and the  CHD Risk Table to determine the patient's CHD Risk.        ATP III CLASSIFICATION (LDL):  <100     mg/dL   Optimal  213-086  mg/dL   Near or Above                    Optimal  130-159  mg/dL   Borderline  578-469  mg/dL   High  >629     mg/dL   Very High Performed at [facility], [address], [place], [place] [address]   I-stat chem 8, ED     Status: Abnormal   Collection Time: 01/02/23  6:18 PM  Result Value Ref Range   Sodium 139 135 - 145 mmol/L   Potassium 4.9 3.5 - 5.1 mmol/L   Chloride 112 (H) 98 - 111 mmol/L   BUN 33 (H) 8 - 23 mg/dL   Creatinine, Ser 3.24 0.44 - 1.00 mg/dL   Glucose, Bld 97 70 - 99 mg/dL    Comment: Glucose reference range applies only to samples taken after fasting for at least 8 hours.   Calcium, Ion 1.03 (L) 1.15 - 1.40 mmol/L   TCO2 24 22 - 32 mmol/L   Hemoglobin 14.6 12.0 - 15.0 g/dL   HCT 40.1 02.7  - 25.3 %  Troponin I (High Sensitivity)     Status: None   Collection Time: 01/02/23  7:55 PM  Result Value Ref Range   Troponin I (High Sensitivity) 14 <18 ng/L    Comment: (NOTE) Elevated high sensitivity troponin I (hsTnI) values and significant  changes across serial measurements may suggest ACS but many other  chronic and acute conditions are known to elevate hsTnI results.  Refer to the "Links" section for chest pain algorithms and additional  guidance. Performed at [facility], [address], [place], [place] [address]   Basic metabolic panel     Status: Abnormal   Collection Time: 01/03/23 12:50 AM  Result Value Ref Range   Sodium 137 135 - 145 mmol/L   Potassium 4.1 3.5 - 5.1 mmol/L   Chloride 105 98 - 111 mmol/L   CO2 22 22 - 32 mmol/L   Glucose, Bld 101 (H) 70 - 99 mg/dL    Comment: Glucose reference range applies only to samples taken after fasting for at least 8 hours.   BUN 20 8 - 23 mg/dL   Creatinine, Ser 3.47 (H) 0.44 - 1.00 mg/dL   Calcium 9.4 8.9 - 42.5 mg/dL   GFR, Estimated 46 (L) >60 mL/min    Comment: (NOTE) Calculated using the CKD-EPI Creatinine Equation (2021)    Anion gap 10 5 - 15    Comment: Performed at [facility], [address], [place], [place] [address]  CBC     Status: None   Collection Time: 01/03/23 12:50 AM  Result Value Ref Range   WBC 10.2 4.0 - 10.5 K/uL   RBC 4.47 3.87 - 5.11 MIL/uL   Hemoglobin 12.9 12.0 - 15.0 g/dL   HCT 75.6 43.3 - 29.5 %   MCV 91.1 80.0 - 100.0 fL   MCH 28.9 26.0 - 34.0 pg   MCHC 31.7 30.0 - 36.0 g/dL   RDW 18.8 41.6 - 60.6 %   Platelets 261 150 - 400 K/uL   nRBC 0.0 0.0 - 0.2 %    Comment: Performed at [facility], [address], [place], [place] [address]  Troponin I (  High Sensitivity)     Status: Abnormal   Collection Time: 01/03/23 12:50 AM  Result Value Ref Range   Troponin I (High Sensitivity) 19 (H) <18 ng/L    Comment: (NOTE) Elevated high sensitivity troponin I (hsTnI)  values and significant  changes across serial measurements may suggest ACS but many other  chronic and acute conditions are known to elevate hsTnI results.  Refer to the "Links" section for chest pain algorithms and additional  guidance. Performed at [facility], [address], [place], [place] [address]   Troponin I (High Sensitivity)     Status: None   Collection Time: 01/03/23  2:33 AM  Result Value Ref Range   Troponin I (High Sensitivity) 16 <18 ng/L    Comment: (NOTE) Elevated high sensitivity troponin I (hsTnI) values and significant  changes across serial measurements may suggest ACS but many other  chronic and acute conditions are known to elevate hsTnI results.  Refer to the "Links" section for chest pain algorithms and additional  guidance. Performed at [facility], [address] [place], [place] [address]    CT Angio Chest/Abd/Pel for Dissection W and/or W/WO  Result Date: 01/02/2023 CLINICAL DATA:  Chest pain, right pleural effusion EXAM: CT ANGIOGRAPHY CHEST, ABDOMEN AND PELVIS TECHNIQUE: Non-contrast CT of the chest was initially obtained. Multidetector CT imaging through the chest, abdomen and pelvis was performed using the standard protocol during bolus administration of intravenous contrast. Multiplanar reconstructed images and MIPs were obtained and reviewed to evaluate the vascular anatomy. RADIATION DOSE REDUCTION: This exam was performed according to the departmental dose-optimization program which includes automated exposure control, adjustment of the mA and/or kV according to patient size and/or use of iterative reconstruction technique. CONTRAST:  OMNIPAQUE IOHEXOL 350 MG/ML SOLN COMPARISON:  01/02/2023, 03/22/2021 FINDINGS: CTA CHEST FINDINGS Cardiovascular: Evaluation of the thoracic aorta is limited by patient motion during the exam. No evidence of thoracic aortic aneurysm or dissection. Dual lead pacemaker is seen within the heart. No  pericardial effusion. Timing of contrast bolus limits evaluation of the pulmonary vasculature. Mediastinum/Nodes: Calcified mediastinal and hilar lymph nodes consistent with previous granulomatous disease. No pathologic adenopathy. Thyroid, trachea, and esophagus are unremarkable. Lungs/Pleura: Bilateral calcified granulomata are noted. Trace right pleural effusion. No airspace disease or pneumothorax. Central airways are patent. Musculoskeletal: No acute or destructive bony lesions. Reconstructed images demonstrate no additional findings. Review of the MIP images confirms the above findings. CTA ABDOMEN AND PELVIS FINDINGS VASCULAR Aorta: Normal caliber aorta without aneurysm, dissection, vasculitis or significant stenosis. Celiac: Patent without evidence of aneurysm, dissection, vasculitis or significant stenosis. SMA: Patent without evidence of aneurysm, dissection, vasculitis or significant stenosis. Renals: No duplicated bilateral renal arteries. Bilateral renal arteries are patent, without evidence of aneurysm, dissection, vasculitis, or fibromuscular dysplasia. No critical stenosis is identified. IMA: Patent without evidence of aneurysm, dissection, vasculitis or significant stenosis. Inflow: Patent without evidence of aneurysm, dissection, vasculitis or significant stenosis. Veins: No obvious venous abnormality within the limitations of this arterial phase study. Review of the MIP images confirms the above findings. NON-VASCULAR Hepatobiliary: No focal liver abnormality is seen. Status post cholecystectomy. No biliary dilatation. Pancreas: Unremarkable. No pancreatic ductal dilatation or surrounding inflammatory changes. Spleen: Normal in size without focal abnormality. Adrenals/Urinary Tract: No urinary tract calculi or obstructive uropathy within either kidney. Kidneys enhance normally and symmetrically. The adrenals and bladder are unremarkable. Stomach/Bowel: No bowel obstruction or ileus. Normal  appendix right lower quadrant. Diverticulosis of the distal colon  without diverticulitis. No bowel wall thickening or inflammatory change. Lymphatic: Multiple subcentimeter mesenteric lymph nodes are identified. Within the central mesentery there is a partially calcified soft tissue mass measuring 4.5 x 2.8 cm in transverse dimension, reference image 154/6, and extending approximately 6 cm in craniocaudal extent. Portions of the mass demonstrate a mildly spiculated margin, while other borders remain lobular. Differential diagnosis would include carcinoid tumor, calcifying fibrous tumor of the mesentery, treated lymphoma, or sequela of previous infection or trauma. No previous imaging of the abdomen and pelvis is available for comparison. Reproductive: Status post hysterectomy. No adnexal masses. Other: No free fluid or free intraperitoneal gas. No abdominal wall hernia. Musculoskeletal: Right hip arthroplasty. No acute or destructive bony lesions. Reconstructed images demonstrate no additional findings. Review of the MIP images confirms the above findings. IMPRESSION: 1. No evidence of thoracoabdominal aortic aneurysm or dissection. 2. Trace right pleural effusion. 3. Indeterminate partially calcified central mesenteric mass. There is a wide differential diagnosis, which includes carcinoid tumor, calcified fibrous tumor of the mesentery, treated lymphoma, or sequela of previous infection or trauma. There is no previous abdominal imaging available for comparison. Please correlate with any known oncologic history. 4. Distal colonic diverticulosis without diverticulitis. 5.  Aortic Atherosclerosis (ICD10-I70.0). Electronically Signed   By: Sharlet Salina M.D.   On: 01/02/2023 18:51   DG Chest Port 1 View  Result Date: 01/02/2023 CLINICAL DATA:  Chest pain EXAM: PORTABLE CHEST 1 VIEW COMPARISON:  03/22/21 cxr FINDINGS: Left-sided dual lead cardiac device in place with leads in the right atrium and right ventricle.  Status post median sternotomy. Unchanged cardiac and mediastinal contours. New small right-sided pleural effusion. No pneumothorax. No focal airspace opacity. No radiographically apparent displaced rib fractures. Visualized upper abdomen is unremarkable IMPRESSION: New small right-sided pleural effusion. Electronically Signed   By: Lorenza Cambridge M.D.   On: 01/02/2023 18:02    Pending Labs Unresulted Labs (From admission, onward)    None       Vitals/Pain Today's Vitals   01/03/23 1000 01/03/23 1045 01/03/23 1100 01/03/23 1145  BP: 128/81 125/77 138/70 117/73  Pulse: 70 71 66 69  Resp: 15 20 18 15   Temp:      TempSrc:      SpO2: 97% 97% 100% 100%  Weight:      Height:      PainSc:        Isolation Precautions No active isolations  Medications Medications  0.9 %  sodium chloride infusion (0 mLs Intravenous Stopped 01/03/23 0246)  nitroGLYCERIN 50 mg in dextrose 5 % 250 mL (0.2 mg/mL) infusion (0 mcg/min Intravenous Stopped 01/03/23 0246)  diltiazem (CARDIZEM CD) 24 hr capsule 120 mg (120 mg Oral Given 01/03/23 0938)  levothyroxine (SYNTHROID) tablet 50 mcg (50 mcg Oral Given 01/03/23 0506)  furosemide (LASIX) tablet 20 mg (20 mg Oral Given 01/03/23 0932)  acetaminophen (TYLENOL) tablet 650 mg (has no administration in time range)    Or  acetaminophen (TYLENOL) suppository 650 mg (has no administration in time range)  senna-docusate (Senokot-S) tablet 1 tablet (has no administration in time range)  melatonin tablet 5 mg (5 mg Oral Given 01/03/23 0244)  metoprolol tartrate (LOPRESSOR) tablet 25 mg (25 mg Oral Given 01/03/23 0932)  apixaban (ELIQUIS) tablet 5 mg (5 mg Oral Given 01/03/23 0952)  aspirin EC tablet 81 mg (81 mg Oral Given 01/03/23 0952)  morphine (PF) 4 MG/ML injection 4 mg (4 mg Intravenous Given 01/02/23 1755)  iohexol (OMNIPAQUE) 350 MG/ML injection 100 mL (  100 mLs Intravenous Contrast Given 01/02/23 1830)    Mobility walks     Focused Assessments Cardiac  Assessment Handoff:  Cardiac Rhythm: Atrial fibrillation Lab Results  Component Value Date   TROPONINI <0.30 07/05/2012   Lab Results  Component Value Date   DDIMER 0.72 (H) 03/22/2021   Does the Patient currently have chest pain? No    R Recommendations: See Admitting Provider Note  Report given to:   Additional Notes:

## 2023-01-03 NOTE — ED Notes (Signed)
Patient awake and alert this morning, no s/s of distress, able to verbalize her needs, will continue to monitor.

## 2023-01-03 NOTE — Progress Notes (Signed)
  Transition of Care [facility]) Screening Note   Patient Details  Name: Samantha Short Date of Birth: [birth year]   Transition of Care [facility]) CM/SW Contact:    Delilah Shan, LCSWA Phone Number: 01/03/2023, 4:45 PM    Transition of Care Department [facility]) has reviewed patient and no TOC needs have been identified at this time. We will continue to monitor patient advancement through interdisciplinary progression rounds. If new patient transition needs arise, please place a TOC consult.

## 2023-01-03 NOTE — Progress Notes (Signed)
Rounding Note    Patient Name: Samantha Short Date of Encounter: 01/03/2023   [facility] Cardiologist: Verne Carrow, MD   Subjective   Back in sinus. No chest pain  Inpatient Medications    Scheduled Meds:  aspirin  324 mg Oral Once   diltiazem  120 mg Oral Daily   furosemide  20 mg Oral Daily   levothyroxine  50 mcg Oral q morning   metoprolol tartrate  25 mg Oral BID   Continuous Infusions:  sodium chloride Stopped (01/03/23 0246)   diltiazem (CARDIZEM) infusion 10 mg/hr (01/03/23 0359)   heparin     nitroGLYCERIN Stopped (01/03/23 0246)   PRN Meds: acetaminophen **OR** acetaminophen, melatonin, senna-docusate   Vital Signs    Vitals:   01/03/23 0615 01/03/23 0630 01/03/23 0645 01/03/23 0734  BP: 121/71 135/68 (!) 150/74 (!) 135/99  Pulse: 69 75 65 (!) 108  Resp: Temp:    (!) 97.5 F (36.4 C)  TempSrc:    Oral  SpO2: 97% 95% 100% 100%  Weight:      Height:       No intake or output data in the 24 hours ending 01/03/23 0905    01/02/2023    5:28 PM 10/17/2022    8:41 PM 08/30/2022    9:55 AM  Last 3 Weights  Weight (lbs) 198 lb 6.6 oz 198 lb 198 lb  Weight (kg) 90 kg 89.812 kg 89.812 kg      Telemetry    Sinus - Personally Reviewed  ECG    No am ekg - Personally Reviewed  Physical Exam   GEN: No acute distress.   Neck: No JVD Cardiac: RRR, systolic murmur.   Respiratory: Clear to auscultation bilaterally. GI: Soft, nontender, non-distended  MS: No edema; No deformity. Neuro:  Nonfocal  Psych: Normal affect   Labs    High Sensitivity Troponin:   Recent Labs  Lab 01/02/23 1732 01/02/23 1955 01/03/23 0050 01/03/23 0233  TROPONINIHS 9 14 19* 16     Chemistry Recent Labs  Lab 01/02/23 1732 01/02/23 1818 01/03/23 0050  NA 140 139 137  K 4.3 4.9 4.1  CL 105 112* 105  CO2 22  --  22  GLUCOSE 104* 97 101*  BUN 22 33* 20  CREATININE 1.21* 1.00 1.21*  CALCIUM 9.5  --  9.4  PROT 7.2  --   --    ALBUMIN 3.6  --   --   AST 29  --   --   ALT 35  --   --   ALKPHOS 88  --   --   BILITOT 0.7  --   --   GFRNONAA 46*  --  46*  ANIONGAP 13  --  10    Lipids  Recent Labs  Lab 01/02/23 1732  CHOL 179  TRIG 50  HDL 62  LDLCALC 107*  CHOLHDL 2.9    Hematology Recent Labs  Lab 01/02/23 1732 01/02/23 1818 01/03/23 0050  WBC 10.3  --  10.2  RBC 4.43  --  4.47  HGB 13.1 14.6 12.9  HCT 39.7 43.0 40.7  MCV 89.6  --  91.1  MCH 29.6  --  28.9  MCHC 33.0  --  31.7  RDW 14.4  --  14.5  PLT 234  --  261   Thyroid No results for input(s): "TSH", "FREET4" in the last 168 hours.  BNPNo results for input(s): "BNP", "PROBNP" in the last  168 hours.  DDimer No results for input(s): "DDIMER" in the last 168 hours.   Radiology    CT Angio Chest/Abd/Pel for Dissection W and/or W/WO  Result Date: 01/02/2023 CLINICAL DATA:  Chest pain, right pleural effusion EXAM: CT ANGIOGRAPHY CHEST, ABDOMEN AND PELVIS TECHNIQUE: Non-contrast CT of the chest was initially obtained. Multidetector CT imaging through the chest, abdomen and pelvis was performed using the standard protocol during bolus administration of intravenous contrast. Multiplanar reconstructed images and MIPs were obtained and reviewed to evaluate the vascular anatomy. RADIATION DOSE REDUCTION: This exam was performed according to the departmental dose-optimization program which includes automated exposure control, adjustment of the mA and/or kV according to patient size and/or use of iterative reconstruction technique. CONTRAST:  OMNIPAQUE IOHEXOL 350 MG/ML SOLN COMPARISON:  01/02/2023, 03/22/2021 FINDINGS: CTA CHEST FINDINGS Cardiovascular: Evaluation of the thoracic aorta is limited by patient motion during the exam. No evidence of thoracic aortic aneurysm or dissection. Dual lead pacemaker is seen within the heart. No pericardial effusion. Timing of contrast bolus limits evaluation of the pulmonary vasculature. Mediastinum/Nodes:  Calcified mediastinal and hilar lymph nodes consistent with previous granulomatous disease. No pathologic adenopathy. Thyroid, trachea, and esophagus are unremarkable. Lungs/Pleura: Bilateral calcified granulomata are noted. Trace right pleural effusion. No airspace disease or pneumothorax. Central airways are patent. Musculoskeletal: No acute or destructive bony lesions. Reconstructed images demonstrate no additional findings. Review of the MIP images confirms the above findings. CTA ABDOMEN AND PELVIS FINDINGS VASCULAR Aorta: Normal caliber aorta without aneurysm, dissection, vasculitis or significant stenosis. Celiac: Patent without evidence of aneurysm, dissection, vasculitis or significant stenosis. SMA: Patent without evidence of aneurysm, dissection, vasculitis or significant stenosis. Renals: No duplicated bilateral renal arteries. Bilateral renal arteries are patent, without evidence of aneurysm, dissection, vasculitis, or fibromuscular dysplasia. No critical stenosis is identified. IMA: Patent without evidence of aneurysm, dissection, vasculitis or significant stenosis. Inflow: Patent without evidence of aneurysm, dissection, vasculitis or significant stenosis. Veins: No obvious venous abnormality within the limitations of this arterial phase study. Review of the MIP images confirms the above findings. NON-VASCULAR Hepatobiliary: No focal liver abnormality is seen. Status post cholecystectomy. No biliary dilatation. Pancreas: Unremarkable. No pancreatic ductal dilatation or surrounding inflammatory changes. Spleen: Normal in size without focal abnormality. Adrenals/Urinary Tract: No urinary tract calculi or obstructive uropathy within either kidney. Kidneys enhance normally and symmetrically. The adrenals and bladder are unremarkable. Stomach/Bowel: No bowel obstruction or ileus. Normal appendix right lower quadrant. Diverticulosis of the distal colon without diverticulitis. No bowel wall thickening or  inflammatory change. Lymphatic: Multiple subcentimeter mesenteric lymph nodes are identified. Within the central mesentery there is a partially calcified soft tissue mass measuring 4.5 x 2.8 cm in transverse dimension, reference image 154/6, and extending approximately 6 cm in craniocaudal extent. Portions of the mass demonstrate a mildly spiculated margin, while other borders remain lobular. Differential diagnosis would include carcinoid tumor, calcifying fibrous tumor of the mesentery, treated lymphoma, or sequela of previous infection or trauma. No previous imaging of the abdomen and pelvis is available for comparison. Reproductive: Status post hysterectomy. No adnexal masses. Other: No free fluid or free intraperitoneal gas. No abdominal wall hernia. Musculoskeletal: Right hip arthroplasty. No acute or destructive bony lesions. Reconstructed images demonstrate no additional findings. Review of the MIP images confirms the above findings. IMPRESSION: 1. No evidence of thoracoabdominal aortic aneurysm or dissection. 2. Trace right pleural effusion. 3. Indeterminate partially calcified central mesenteric mass. There is a wide differential diagnosis, which includes carcinoid tumor, calcified fibrous tumor  of the mesentery, treated lymphoma, or sequela of previous infection or trauma. There is no previous abdominal imaging available for comparison. Please correlate with any known oncologic history. 4. Distal colonic diverticulosis without diverticulitis. 5.  Aortic Atherosclerosis (ICD10-I70.0). Electronically Signed   By: Sharlet Salina M.D.   On: 01/02/2023 18:51   DG Chest Port 1 View  Result Date: 01/02/2023 CLINICAL DATA:  Chest pain EXAM: PORTABLE CHEST 1 VIEW COMPARISON:  03/22/21 cxr FINDINGS: Left-sided dual lead cardiac device in place with leads in the right atrium and right ventricle. Status post median sternotomy. Unchanged cardiac and mediastinal contours. New small right-sided pleural effusion. No  pneumothorax. No focal airspace opacity. No radiographically apparent displaced rib fractures. Visualized upper abdomen is unremarkable IMPRESSION: New small right-sided pleural effusion. Electronically Signed   By: Lorenza Cambridge M.D.   On: 01/02/2023 18:02    Cardiac Studies     Patient Profile     77 y.o. female with history of CAD s/p 1V CABG in 2010, HTN, HLD, GERD, mitral regurgitation, chronic diastolic CHF, atrial fibrillation and high grade AV block s/p permanent pacemaker placement admitted with chest pain, hypertensive urgency and rapid atrial fibrillation.   Assessment & Plan    Atrial fib with RVR: Known history of atrial fib. She is on Eliquis and Cardizem at home. Admitted with RVR but now back in sinus. Will stop IV Cardizem and restart Cardizem CD 120 mg daily. Stop IV heparin and resume Eliquis.  Chest pain: Chest pain in setting of hypertensive urgency and rapid atrial fib. Heart rate and BP now controlled and chest pain resolved. Troponin negative. Cardiac cath not indicated Hypertensive urgency: BP much improved.  Aortic stenosis: Moderate by echo in 2023. Repeat echo today  Would monitor 24 more hours.    For questions or updates, please contact [facility] Please consult [email or web address] for contact info under        Signed, Verne Carrow, MD  01/03/2023, 9:05 AM

## 2023-01-03 NOTE — ED Notes (Signed)
Patient leaving the floor in stable condition, AOX4,  with a RN, her belongings and family. Floor called to verify they were ready and aware of the patient.

## 2023-01-03 NOTE — Progress Notes (Signed)
ANTICOAGULATION CONSULT NOTE - Initial Consult  Pharmacy Consult for Heparin Indication: atrial fibrillation  Allergies  Allergen Reactions   Ceclor [Cefaclor] Other (See Comments)    Lost vision in eye   Hctz [Hydrochlorothiazide] Other (See Comments)    Renal insufficiency   Lipitor [Atorvastatin Calcium] Other (See Comments)    Increased A1C   Norvasc [Amlodipine] Other (See Comments)    Makes patient stiff   Penicillins Hives, Shortness Of Breath, Itching and Rash    Has patient had a PCN reaction causing immediate rash, facial/tongue/throat swelling, SOB or lightheadedness with hypotension: Yes Has patient had a PCN reaction causing severe rash involving mucus membranes or skin necrosis: Unk Has patient had a PCN reaction that required hospitalization: No Has patient had a PCN reaction occurring within the last 10 years: No If all of the above answers are "NO", then may proceed with Cephalosporin use.    Sulfa Antibiotics Other (See Comments)    CAUSES SHOCK   Statins Other (See Comments)    MYALGIAS AND WEAKNESS   Milk-Related Compounds Diarrhea and Nausea And Vomiting   Erythromycin Rash   Latex Rash   Levofloxacin Rash   Tetracyclines & Related Rash    Patient Measurements: Height:  (160 cm) Weight: 90 kg (198 lb 6.6 oz) IBW/kg (Calculated) : 52.4 Heparin Dosing Weight: 72.8 kg  Vital Signs: Temp: 97.7 F (36.5 C) (04/23 2313) Temp Source: Oral (04/23 2313) BP: 146/89 (04/24 0030) Pulse Rate: 95 (04/24 0030)  Labs: Recent Labs    01/02/23 1732 01/02/23 1818 01/02/23 1955  HGB 13.1 14.6  --   HCT 39.7 43.0  --   PLT 234  --   --   APTT 33  --   --   LABPROT 18.1*  --   --   INR 1.5*  --   --   CREATININE 1.21* 1.00  --   TROPONINIHS 9  --  14    Estimated Creatinine Clearance: 50.9 mL/min (by C-G formula based on SCr of 1 mg/dL).   Medical History: Past Medical History:  Diagnosis Date   Anemia    pernicious   Anxiety    Arthritis     Asthma    Chronic diastolic CHF (congestive heart failure)    a. 02/2011 Echo: EF 55-60%, Gr2 DD, Mild MR   Chronic kidney disease    stage 3   Complication of anesthesia    Coronary artery disease    a. s/p CABG x 1 2010:  LIMA->LAD.;  b. amdx for CP => LHC 07/04/12: LAD 70-80%, mid RCA 30%, LIMA-LAD patent with competitive flow limiting distal LAD filling, EF 70% with hyperdynamic LV function. Medical therapy continued.   Depression    Dyslipidemia    Dysrhythmia    hx of Afib   GERD (gastroesophageal reflux disease)    Headache    hx of migraines   Heart murmur    History of hiatal hernia    History of kidney stones    Hyperlipidemia    Hypertension    Hyperthyroidism    Hypothyroidism    Mild aortic stenosis    Mitral regurgitation    a. mild by echo 02/2011.   Pneumonia    PONV (postoperative nausea and vomiting)    Presence of permanent cardiac pacemaker    Sleep apnea    diagnosed 08-2022   Assessment: Patient with PMHx of CAD/CABG x1 2021, second-degree heart block sp PPM 2021, atrial fibrillation on anticoagulants, OSA on  BiPAP, HLD, HTN, GERD, CKD stage III, hypothyroidism, anxiety and depression presenting for chest pain. Patient currently anticoagulated with apixaban with last dose on 4/23 . Hgb 13.1, Plts 234, No noted bleeding.  Goal of Therapy:  Heparin level 0.3-0.7 units/ml aPTT 66-102 seconds Monitor platelets by anticoagulation protocol: Yes   Plan:  Start heparin infusion of 1000units/hr on 4/24  No bolus due to patient taking apixaban prior to heparin HL/APTT in 8 hours post initiation of infusion; Clinically correlate HL/APTT once patient starts to clear apixaban Monitor CBC and for signs/symptoms of bleeding  Loretta Plume 01/03/2023,12:51 AM

## 2023-01-03 NOTE — ED Notes (Addendum)
Per MD McAlhany to stop Cardizem drip and Heparin drip and give PO Cardizem.

## 2023-01-04 ENCOUNTER — Inpatient Hospital Stay (HOSPITAL_COMMUNITY): Payer: Medicare HMO

## 2023-01-04 DIAGNOSIS — I160 Hypertensive urgency: Secondary | ICD-10-CM | POA: Diagnosis not present

## 2023-01-04 DIAGNOSIS — I350 Nonrheumatic aortic (valve) stenosis: Secondary | ICD-10-CM | POA: Diagnosis not present

## 2023-01-04 DIAGNOSIS — I471 Supraventricular tachycardia, unspecified: Secondary | ICD-10-CM

## 2023-01-04 DIAGNOSIS — I4891 Unspecified atrial fibrillation: Secondary | ICD-10-CM | POA: Diagnosis not present

## 2023-01-04 LAB — ECHOCARDIOGRAM COMPLETE
AR max vel: 1.55 cm2
AV Area VTI: 1.43 cm2
AV Area mean vel: 1.42 cm2
AV Mean grad: 17 mmHg
AV Peak grad: 25.4 mmHg
Ao pk vel: 2.52 m/s
Area-P 1/2: 3.03 cm2
Height: 63 in
MV M vel: 4.11 m/s
MV Peak grad: 67.6 mmHg
S' Lateral: 2.7 cm
Weight: 3214.4 oz

## 2023-01-04 MED ORDER — DOCUSATE SODIUM 100 MG PO CAPS
100.0000 mg | ORAL_CAPSULE | Freq: Two times a day (BID) | ORAL | Status: DC | PRN
  Administered 2023-01-04: 100 mg via ORAL
  Filled 2023-01-04: qty 1

## 2023-01-04 MED ORDER — AMIODARONE HCL 200 MG PO TABS
400.0000 mg | ORAL_TABLET | Freq: Two times a day (BID) | ORAL | Status: DC
  Administered 2023-01-04 – 2023-01-05 (×3): 400 mg via ORAL
  Filled 2023-01-04 (×3): qty 2

## 2023-01-04 MED ORDER — METOPROLOL TARTRATE 50 MG PO TABS
50.0000 mg | ORAL_TABLET | Freq: Two times a day (BID) | ORAL | Status: DC
  Administered 2023-01-04 – 2023-01-05 (×3): 50 mg via ORAL
  Filled 2023-01-04 (×3): qty 1

## 2023-01-04 NOTE — Progress Notes (Addendum)
Rounding Note    Patient Name: Samantha Short Date of Encounter: 01/04/2023   [facility] Cardiologist: Verne Carrow, MD   Subjective   Some palpitation started around 3AM this morning. No pain.  Inpatient Medications    Scheduled Meds:  apixaban  5 mg Oral BID   diltiazem  120 mg Oral Daily   furosemide  20 mg Oral Daily   levothyroxine  50 mcg Oral q morning   metoprolol tartrate  25 mg Oral BID   Continuous Infusions:  sodium chloride Stopped (01/03/23 0242)   PRN Meds: acetaminophen **OR** acetaminophen, melatonin, senna-docusate   Vital Signs    Vitals:   01/04/23 0012 01/04/23 0021 01/04/23 0357 01/04/23 0722  BP:  110/60 124/76 136/69  Pulse: 77 78 77 79  Resp: Temp:  (!) 97.5 F (36.4 C) (!) 97.4 F (36.3 C) 97.9 F (36.6 C)  TempSrc:  Oral Oral Oral  SpO2: 97% 94% 96% 96%  Weight:      Height:        Intake/Output Summary (Last 24 hours) at 01/04/2023 0818 Last data filed at 01/03/2023 1625 Gross per 24 hour  Intake 195.01 ml  Output --  Net 195.01 ml      01/03/2023    1:53 PM 01/02/2023    5:28 PM 10/17/2022    8:41 PM  Last 3 Weights  Weight (lbs) 200 lb 14.4 oz 198 lb 6.6 oz 198 lb  Weight (kg) 91.128 kg 90 kg 89.812 kg      Telemetry    Back in atrial fibrillation - Personally Reviewed  ECG    Atrial fibrillation - Personally Reviewed  Physical Exam   GEN: No acute distress.   Neck: No JVD Cardiac: irregular, no rubs, or gallops. 2/6 systolic murmur Respiratory: Clear to auscultation bilaterally. GI: Soft, nontender, non-distended  MS: No edema; No deformity. Neuro:  Nonfocal  Psych: Normal affect   Labs    High Sensitivity Troponin:   Recent Labs  Lab 01/02/23 1732 01/02/23 1955 01/03/23 0050 01/03/23 0233  TROPONINIHS 9 14 19* 16     Chemistry Recent Labs  Lab 01/02/23 1732 01/02/23 1818 01/03/23 0050  NA 140 139 137  K 4.3 4.9 4.1  CL 105 112* 105  CO2 22  --  22  GLUCOSE  104* 97 101*  BUN 22 33* 20  CREATININE 1.21* 1.00 1.21*  CALCIUM 9.5  --  9.4  PROT 7.2  --   --   ALBUMIN 3.6  --   --   AST 29  --   --   ALT 35  --   --   ALKPHOS 88  --   --   BILITOT 0.7  --   --   GFRNONAA 46*  --  46*  ANIONGAP 13  --  10    Lipids  Recent Labs  Lab 01/02/23 1732  CHOL 179  TRIG 50  HDL 62  LDLCALC 107*  CHOLHDL 2.9    Hematology Recent Labs  Lab 01/02/23 1732 01/02/23 1818 01/03/23 0050  WBC 10.3  --  10.2  RBC 4.43  --  4.47  HGB 13.1 14.6 12.9  HCT 39.7 43.0 40.7  MCV 89.6  --  91.1  MCH 29.6  --  28.9  MCHC 33.0  --  31.7  RDW 14.4  --  14.5  PLT 234  --  261   Thyroid No results for input(s): "TSH", "FREET4" in  the last 168 hours.  BNPNo results for input(s): "BNP", "PROBNP" in the last 168 hours.  DDimer No results for input(s): "DDIMER" in the last 168 hours.   Radiology    CT Angio Chest/Abd/Pel for Dissection W and/or W/WO  Result Date: 01/02/2023 CLINICAL DATA:  Chest pain, right pleural effusion EXAM: CT ANGIOGRAPHY CHEST, ABDOMEN AND PELVIS TECHNIQUE: Non-contrast CT of the chest was initially obtained. Multidetector CT imaging through the chest, abdomen and pelvis was performed using the standard protocol during bolus administration of intravenous contrast. Multiplanar reconstructed images and MIPs were obtained and reviewed to evaluate the vascular anatomy. RADIATION DOSE REDUCTION: This exam was performed according to the departmental dose-optimization program which includes automated exposure control, adjustment of the mA and/or kV according to patient size and/or use of iterative reconstruction technique. CONTRAST:  OMNIPAQUE IOHEXOL 350 MG/ML SOLN COMPARISON:  01/02/2023, 03/22/2021 FINDINGS: CTA CHEST FINDINGS Cardiovascular: Evaluation of the thoracic aorta is limited by patient motion during the exam. No evidence of thoracic aortic aneurysm or dissection. Dual lead pacemaker is seen within the heart. No pericardial  effusion. Timing of contrast bolus limits evaluation of the pulmonary vasculature. Mediastinum/Nodes: Calcified mediastinal and hilar lymph nodes consistent with previous granulomatous disease. No pathologic adenopathy. Thyroid, trachea, and esophagus are unremarkable. Lungs/Pleura: Bilateral calcified granulomata are noted. Trace right pleural effusion. No airspace disease or pneumothorax. Central airways are patent. Musculoskeletal: No acute or destructive bony lesions. Reconstructed images demonstrate no additional findings. Review of the MIP images confirms the above findings. CTA ABDOMEN AND PELVIS FINDINGS VASCULAR Aorta: Normal caliber aorta without aneurysm, dissection, vasculitis or significant stenosis. Celiac: Patent without evidence of aneurysm, dissection, vasculitis or significant stenosis. SMA: Patent without evidence of aneurysm, dissection, vasculitis or significant stenosis. Renals: No duplicated bilateral renal arteries. Bilateral renal arteries are patent, without evidence of aneurysm, dissection, vasculitis, or fibromuscular dysplasia. No critical stenosis is identified. IMA: Patent without evidence of aneurysm, dissection, vasculitis or significant stenosis. Inflow: Patent without evidence of aneurysm, dissection, vasculitis or significant stenosis. Veins: No obvious venous abnormality within the limitations of this arterial phase study. Review of the MIP images confirms the above findings. NON-VASCULAR Hepatobiliary: No focal liver abnormality is seen. Status post cholecystectomy. No biliary dilatation. Pancreas: Unremarkable. No pancreatic ductal dilatation or surrounding inflammatory changes. Spleen: Normal in size without focal abnormality. Adrenals/Urinary Tract: No urinary tract calculi or obstructive uropathy within either kidney. Kidneys enhance normally and symmetrically. The adrenals and bladder are unremarkable. Stomach/Bowel: No bowel obstruction or ileus. Normal appendix right  lower quadrant. Diverticulosis of the distal colon without diverticulitis. No bowel wall thickening or inflammatory change. Lymphatic: Multiple subcentimeter mesenteric lymph nodes are identified. Within the central mesentery there is a partially calcified soft tissue mass measuring 4.5 x 2.8 cm in transverse dimension, reference image 154/6, and extending approximately 6 cm in craniocaudal extent. Portions of the mass demonstrate a mildly spiculated margin, while other borders remain lobular. Differential diagnosis would include carcinoid tumor, calcifying fibrous tumor of the mesentery, treated lymphoma, or sequela of previous infection or trauma. No previous imaging of the abdomen and pelvis is available for comparison. Reproductive: Status post hysterectomy. No adnexal masses. Other: No free fluid or free intraperitoneal gas. No abdominal wall hernia. Musculoskeletal: Right hip arthroplasty. No acute or destructive bony lesions. Reconstructed images demonstrate no additional findings. Review of the MIP images confirms the above findings. IMPRESSION: 1. No evidence of thoracoabdominal aortic aneurysm or dissection. 2. Trace right pleural effusion. 3. Indeterminate partially calcified central mesenteric  mass. There is a wide differential diagnosis, which includes carcinoid tumor, calcified fibrous tumor of the mesentery, treated lymphoma, or sequela of previous infection or trauma. There is no previous abdominal imaging available for comparison. Please correlate with any known oncologic history. 4. Distal colonic diverticulosis without diverticulitis. 5.  Aortic Atherosclerosis (ICD10-I70.0). Electronically Signed   By: Sharlet Salina M.D.   On: 01/02/2023 18:51   DG Chest Port 1 View  Result Date: 01/02/2023 CLINICAL DATA:  Chest pain EXAM: PORTABLE CHEST 1 VIEW COMPARISON:  03/22/21 cxr FINDINGS: Left-sided dual lead cardiac device in place with leads in the right atrium and right ventricle. Status post  median sternotomy. Unchanged cardiac and mediastinal contours. New small right-sided pleural effusion. No pneumothorax. No focal airspace opacity. No radiographically apparent displaced rib fractures. Visualized upper abdomen is unremarkable IMPRESSION: New small right-sided pleural effusion. Electronically Signed   By: Lorenza Cambridge M.D.   On: 01/02/2023 18:02    Cardiac Studies   Echo 04/17/2022  1. The aortic valve is tricuspid. There is moderate calcification of the  aortic valve. There is moderate thickening of the aortic valve. Aortic  valve regurgitation is not visualized. Moderate aortic valve stenosis.  Aortic valve area, by VTI measures  1.15 cm. Aortic valve mean gradient measures 23.0 mmHg. Aortic valve Vmax  measures 3.16 m/s.   2. Left ventricular ejection fraction, by estimation, is 65 to 70%. The  left ventricle has normal function. The left ventricle has no regional  wall motion abnormalities. Left ventricular diastolic parameters are  consistent with Grade I diastolic  dysfunction (impaired relaxation).   3. Right ventricular systolic function is normal. The right ventricular  size is normal. There is normal pulmonary artery systolic pressure. The  estimated right ventricular systolic pressure is 26.4 mmHg.   4. The mitral valve is grossly normal. Trivial mitral valve  regurgitation. No evidence of mitral stenosis.   5. The inferior vena cava is normal in size with greater than 50%  respiratory variability, suggesting right atrial pressure of 3 mmHg.   Comparison(s): Changes from prior study are noted. AS is now moderate.    Patient Profile     77 y.o. female with PMH of CABG (LIMA to LAD in 2010), CHB s/p [employer] PPM last year, Afib, HFpEF, GERD, HLD and HTN who presented with hypertensive urgency and chest pain and was in afib with RVR.  Assessment & Plan    Chest pain  - occurred in the setting of hypertensive urgency and rapid afib. Serial trop negative. No  dissection seen on CTA of chest and abdomen  - pending echocardiogram, if normal, would not pursue further workup. Last echo 04/17/2022 moderate AS with EF 65-70%  - mild chest discomfort this morning/palpitation, tele showed recurrence of afib. Pending repeat echo  Afib with RVR  - self converted on IV cardizem, placed back on home cardizem. Eliquis initially held, however since chest pain resolved and serial trop negative, eliquis resumed  - converted back to afib, currently HR 70s laying down, goes up to 90s with ambulation. On home Cardizem CD 120mg  daily. Metoprolol tartrate added during this admission, increase metoprolol to 50mg  BID. May need to consider antiarrhythmic therapy, options include amiodarone or tikosyn.   Partially calcified central mesenteric mass: incidentally found on CT image. Per primary team, discussed with [facility] who recommended EGD/colonoscopy as outpatient with surgical oncology follow up   Hypertensive urgency: BP normalized.   CAD s/p CABG   Aortic stenosis: moderate on last  echo 04/2022, pending repeat.   CHB s/p [employer] PPM  HLD      For questions or updates, please contact [facility] Please consult [email or web address] for contact info under        Signed, Azalee Course, PA  01/04/2023, 8:18 AM    I have personally seen and examined this patient. I agree with the assessment and plan as outlined above.  Recurrent atrial fib. She feels weak. Will add amiodarone 400 mg po BID. Continue Cardizem and metoprolol. BP is normal. Will continue on telemetry today.  Probable discharge home tomorrow.   Verne Carrow, MD, [facility] 01/04/2023 9:26 AM

## 2023-01-04 NOTE — Plan of Care (Signed)

## 2023-01-04 NOTE — Progress Notes (Addendum)
PROGRESS NOTE    CALIE BUTTREY  ZOX:[id number] DOB: Oct 29, 1945 DOA: 01/02/2023 PCP: Mila Palmer, MD  77 y.o. female with history of CAD s/p 1V CABG in 2010, HTN, HLD, GERD, mitral regurgitation, chronic diastolic CHF, atrial fibrillation and high grade AV block s/p permanent pacemaker placement admitted with chest pain, hypertensive urgency and rapid atrial fibrillation.  -Admitted, started on Cardizem gtt., then transitioned to oral  Subjective: -Feels okay, no events overnight  Assessment and Plan:  A-fib with RVR -Converted to sinus rhythm, now back in Afib -Switch from IV to oral Cardizem, starting amiodarone today  -Continue metoprolol, continue Eliquis -Cards following, echo pending  Acute on chronic diastolic CHF -Likely triggered by A-fib RVR, volume status has improved -sp IV Lasix x1 only -Now changed to oral Lasix, follow-up echo today  Chest pain/pressure -Likely secondary to above, no ACS, follow-up echo  Hypertensive urgency -Resolved, dc nitro gtt  Moderate aortic stenosis -Follow-up repeat echo today  Indeterminant partially calcified central mesenteric mass -No prior history, no symptoms of carcinoid -discussed with [facility], recommended EGD/Colonoscopy as outpt with Surgical Onc FU -message sent to [facility] Dr. Levora Angel for outpatient endoscopy and colonoscopy, will request IR consult for outpatient biopsy at discharge   DVT prophylaxis: Apixaban Code Status: Full code Family Communication: None present Disposition Plan: Home likely tomorrow  Consultants:    Procedures:   Antimicrobials:    Objective: Vitals:   01/04/23 0021 01/04/23 0357 01/04/23 0722 01/04/23 1359  BP: 110/60 124/76 136/69 (!) 115/58  Pulse: 78 77 79 71  Resp: Temp: (!) 97.5 F (36.4 C) (!) 97.4 F (36.3 C) 97.9 F (36.6 C) (!) 97.5 F (36.4 C)  TempSrc: Oral Oral Oral Oral  SpO2: 94% 96% 96% 98%  Weight:      Height:        Intake/Output Summary  (Last 24 hours) at 01/04/2023 1408 Last data filed at 01/03/2023 1625 Gross per 24 hour  Intake 195.01 ml  Output --  Net 195.01 ml   Filed Weights   01/02/23 1728 01/03/23 1353  Weight: 90 kg 91.1 kg    Examination:  General exam: Pleasant female sitting up in bed, AAOx3, no distress HEENT: No JVD CVS: S1-S2, irregularly irregular rhythm Lungs: Clear bilaterally Abdomen: Soft, nontender, bowel sounds present Extremities: No edema  Skin: No rashes Psychiatry:  Mood & affect appropriate.     Data Reviewed:   CBC: Recent Labs  Lab 01/02/23 1732 01/02/23 1818 01/03/23 0050  WBC 10.3  --  10.2  NEUTROABS 7.5  --   --   HGB 13.1 14.6 12.9  HCT 39.7 43.0 40.7  MCV 89.6  --  91.1  PLT 234  --  261   Basic Metabolic Panel: Recent Labs  Lab 01/02/23 1732 01/02/23 1818 01/03/23 0050  NA 140 139 137  K 4.3 4.9 4.1  CL 105 112* 105  CO2 22  --  22  GLUCOSE 104* 97 101*  BUN 22 33* 20  CREATININE 1.21* 1.00 1.21*  CALCIUM 9.5  --  9.4   GFR: Estimated Creatinine Clearance: 42.4 mL/min (A) (by C-G formula based on SCr of 1.21 mg/dL (H)). Liver Function Tests: Recent Labs  Lab 01/02/23 1732  AST 29  ALT 35  ALKPHOS 88  BILITOT 0.7  PROT 7.2  ALBUMIN 3.6   No results for input(s): "LIPASE", "AMYLASE" in the last 168 hours. No results for input(s): "AMMONIA" in the last 168 hours. Coagulation  Profile: Recent Labs  Lab 01/02/23 1732  INR 1.5*   Cardiac Enzymes: No results for input(s): "CKTOTAL", "CKMB", "CKMBINDEX", "TROPONINI" in the last 168 hours. BNP (last 3 results) No results for input(s): "PROBNP" in the last 8760 hours. HbA1C: Recent Labs    01/02/23 1732  HGBA1C 5.7*   CBG: No results for input(s): "GLUCAP" in the last 168 hours. Lipid Profile: Recent Labs    01/02/23 1732  CHOL 179  HDL 62  LDLCALC 107*  TRIG 50  CHOLHDL 2.9   Thyroid Function Tests: No results for input(s): "TSH", "T4TOTAL", "FREET4", "T3FREE", "THYROIDAB" in  the last 72 hours. Anemia Panel: No results for input(s): "VITAMINB12", "FOLATE", "FERRITIN", "TIBC", "IRON", "RETICCTPCT" in the last 72 hours. Urine analysis:    Component Value Date/Time   COLORURINE YELLOW 05/23/2018 2226   APPEARANCEUR HAZY (A) 05/23/2018 2226   LABSPEC 1.014 05/23/2018 2226   PHURINE 5.0 05/23/2018 2226   GLUCOSEU NEGATIVE 05/23/2018 2226   HGBUR NEGATIVE 05/23/2018 2226   BILIRUBINUR NEGATIVE 05/23/2018 2226   KETONESUR NEGATIVE 05/23/2018 2226   PROTEINUR NEGATIVE 05/23/2018 2226   UROBILINOGEN 0.2 01/13/2015 1545   NITRITE NEGATIVE 05/23/2018 2226   LEUKOCYTESUR LARGE (A) 05/23/2018 2226   Sepsis Labs: @LABRCNTIP (procalcitonin:4,lacticidven:4)  )No results found for this or any previous visit (from the past 240 hour(s)).   Radiology Studies: ECHOCARDIOGRAM COMPLETE  Result Date: 01/04/2023    ECHOCARDIOGRAM REPORT   Patient Name:   BIRDA DIDONATO Date of Exam: 01/04/2023 Medical Rec #:  [id number]       Height:       63.0 in Accession #:    [id number]      Weight:       200.9 lb Date of Birth:  December 30, 1945      BSA:          1.937 m Patient Age:    76 years        BP:           136/69 mmHg Patient Gender: F               HR:           80 bpm. Exam Location:  Inpatient Procedure: 2D Echo, Cardiac Doppler and Color Doppler Indications:    Aortic stenosis  History:        Patient has prior history of Echocardiogram examinations, most                 recent 04/17/2022. CHF, CAD, Pacemaker and Prior CABG, Mitral                 Valve Disease and Aortic Valve Disease, Signs/Symptoms:Murmur;                 Risk Factors:Sleep Apnea, Hypertension and Dyslipidemia. CKD.  Sonographer:    Milda Smart Referring Phys: 42 CHRISTOPHER D MCALHANY  Sonographer Comments: Image acquisition challenging due to patient body habitus and Image acquisition challenging due to respiratory motion. IMPRESSIONS  1. Left ventricular ejection fraction, by estimation, is 60 to 65%. The left  ventricle has normal function. The left ventricle has no regional wall motion abnormalities. Left ventricular diastolic function could not be evaluated.  2. Right ventricular systolic function is normal. The right ventricular size is normal. There is normal pulmonary artery systolic pressure. The estimated right ventricular systolic pressure is 28.2 mmHg.  3. The mitral valve is normal in structure. Mild mitral valve regurgitation. No evidence of mitral stenosis. Moderate mitral  annular calcification.  4. The aortic valve is normal in structure. There is severe calcifcation of the aortic valve. There is severe thickening of the aortic valve. Aortic valve regurgitation is trivial. Mild to moderate aortic valve stenosis. Aortic valve area, by VTI measures 1.43 cm. Aortic valve mean gradient measures 17.0 mmHg. Aortic valve Vmax measures 2.52 m/s. DVI 0.41  5. The inferior vena cava is dilated in size with >50% respiratory variability, suggesting right atrial pressure of 8 mmHg. FINDINGS  Left Ventricle: Left ventricular ejection fraction, by estimation, is 60 to 65%. The left ventricle has normal function. The left ventricle has no regional wall motion abnormalities. The left ventricular internal cavity size was normal in size. There is  no left ventricular hypertrophy. Left ventricular diastolic function could not be evaluated due to atrial fibrillation. Left ventricular diastolic function could not be evaluated. Right Ventricle: The right ventricular size is normal. No increase in right ventricular wall thickness. Right ventricular systolic function is normal. There is normal pulmonary artery systolic pressure. The tricuspid regurgitant velocity is 2.25 m/s, and  with an assumed right atrial pressure of 8 mmHg, the estimated right ventricular systolic pressure is 28.2 mmHg. Left Atrium: Left atrial size was normal in size. Right Atrium: Right atrial size was normal in size. Pericardium: There is no evidence of  pericardial effusion. Mitral Valve: The mitral valve is normal in structure. Moderate mitral annular calcification. Mild mitral valve regurgitation. No evidence of mitral valve stenosis. Tricuspid Valve: The tricuspid valve is normal in structure. Tricuspid valve regurgitation is mild . No evidence of tricuspid stenosis. Aortic Valve: The aortic valve is normal in structure. There is severe calcifcation of the aortic valve. There is severe thickening of the aortic valve. Aortic valve regurgitation is trivial. Mild to moderate aortic stenosis is present. Aortic valve mean  gradient measures 17.0 mmHg. Aortic valve peak gradient measures 25.4 mmHg. Aortic valve area, by VTI measures 1.43 cm. Pulmonic Valve: The pulmonic valve was normal in structure. Pulmonic valve regurgitation is trivial. No evidence of pulmonic stenosis. Aorta: The aortic root is normal in size and structure. Venous: The inferior vena cava is dilated in size with greater than 50% respiratory variability, suggesting right atrial pressure of 8 mmHg. IAS/Shunts: No atrial level shunt detected by color flow Doppler. Additional Comments: A venous catheter is visualized.  LEFT VENTRICLE PLAX 2D LVIDd:         3.70 cm LVIDs:         2.70 cm LV PW:         1.00 cm LV IVS:        1.00 cm LVOT diam:     2.10 cm LV SV:         78 LV SV Index:   40 LVOT Area:     3.46 cm  RIGHT VENTRICLE            IVC RV S prime:     6.31 cm/s  IVC diam: 2.30 cm LEFT ATRIUM             Index        RIGHT ATRIUM           Index LA diam:        4.20 cm 2.17 cm/m   RA Area:     17.90 cm LA Vol (A2C):   64.9 ml 33.50 ml/m  RA Volume:   51.00 ml  26.32 ml/m LA Vol (A4C):   54.0 ml 27.87 ml/m LA  Biplane Vol: 59.2 ml 30.56 ml/m  AORTIC VALVE AV Area (Vmax):    1.55 cm AV Area (Vmean):   1.42 cm AV Area (VTI):     1.43 cm AV Vmax:           252.00 cm/s AV Vmean:          199.000 cm/s AV VTI:            0.543 m AV Peak Grad:      25.4 mmHg AV Mean Grad:      17.0 mmHg LVOT  Vmax:         113.00 cm/s LVOT Vmean:        81.300 cm/s LVOT VTI:          0.224 m LVOT/AV VTI ratio: 0.41  AORTA Ao Root diam: 2.60 cm Ao Asc diam:  2.90 cm MITRAL VALVE                TRICUSPID VALVE MV Area (PHT): 3.03 cm     TR Peak grad:   20.2 mmHg MV Decel Time: 250 msec     TR Mean grad:   15.0 mmHg MR Peak grad: 67.6 mmHg     TR Vmax:        225.00 cm/s MR Vmax:      411.00 cm/s   TR Vmean:       186.0 cm/s MV E velocity: 101.00 cm/s                             SHUNTS                             Systemic VTI:  0.22 m                             Systemic Diam: 2.10 cm Armanda Magic MD Electronically signed by Armanda Magic MD Signature Date/Time: 01/04/2023/1:27:59 PM    Final    CT Angio Chest/Abd/Pel for Dissection W and/or W/WO  Result Date: 01/02/2023 CLINICAL DATA:  Chest pain, right pleural effusion EXAM: CT ANGIOGRAPHY CHEST, ABDOMEN AND PELVIS TECHNIQUE: Non-contrast CT of the chest was initially obtained. Multidetector CT imaging through the chest, abdomen and pelvis was performed using the standard protocol during bolus administration of intravenous contrast. Multiplanar reconstructed images and MIPs were obtained and reviewed to evaluate the vascular anatomy. RADIATION DOSE REDUCTION: This exam was performed according to the departmental dose-optimization program which includes automated exposure control, adjustment of the mA and/or kV according to patient size and/or use of iterative reconstruction technique. CONTRAST:  OMNIPAQUE IOHEXOL 350 MG/ML SOLN COMPARISON:  01/02/2023, 03/22/2021 FINDINGS: CTA CHEST FINDINGS Cardiovascular: Evaluation of the thoracic aorta is limited by patient motion during the exam. No evidence of thoracic aortic aneurysm or dissection. Dual lead pacemaker is seen within the heart. No pericardial effusion. Timing of contrast bolus limits evaluation of the pulmonary vasculature. Mediastinum/Nodes: Calcified mediastinal and hilar lymph nodes consistent with  previous granulomatous disease. No pathologic adenopathy. Thyroid, trachea, and esophagus are unremarkable. Lungs/Pleura: Bilateral calcified granulomata are noted. Trace right pleural effusion. No airspace disease or pneumothorax. Central airways are patent. Musculoskeletal: No acute or destructive bony lesions. Reconstructed images demonstrate no additional findings. Review of the MIP images confirms the above findings. CTA ABDOMEN AND PELVIS FINDINGS VASCULAR Aorta: Normal caliber aorta without aneurysm, dissection,  vasculitis or significant stenosis. Celiac: Patent without evidence of aneurysm, dissection, vasculitis or significant stenosis. SMA: Patent without evidence of aneurysm, dissection, vasculitis or significant stenosis. Renals: No duplicated bilateral renal arteries. Bilateral renal arteries are patent, without evidence of aneurysm, dissection, vasculitis, or fibromuscular dysplasia. No critical stenosis is identified. IMA: Patent without evidence of aneurysm, dissection, vasculitis or significant stenosis. Inflow: Patent without evidence of aneurysm, dissection, vasculitis or significant stenosis. Veins: No obvious venous abnormality within the limitations of this arterial phase study. Review of the MIP images confirms the above findings. NON-VASCULAR Hepatobiliary: No focal liver abnormality is seen. Status post cholecystectomy. No biliary dilatation. Pancreas: Unremarkable. No pancreatic ductal dilatation or surrounding inflammatory changes. Spleen: Normal in size without focal abnormality. Adrenals/Urinary Tract: No urinary tract calculi or obstructive uropathy within either kidney. Kidneys enhance normally and symmetrically. The adrenals and bladder are unremarkable. Stomach/Bowel: No bowel obstruction or ileus. Normal appendix right lower quadrant. Diverticulosis of the distal colon without diverticulitis. No bowel wall thickening or inflammatory change. Lymphatic: Multiple subcentimeter  mesenteric lymph nodes are identified. Within the central mesentery there is a partially calcified soft tissue mass measuring 4.5 x 2.8 cm in transverse dimension, reference image 154/6, and extending approximately 6 cm in craniocaudal extent. Portions of the mass demonstrate a mildly spiculated margin, while other borders remain lobular. Differential diagnosis would include carcinoid tumor, calcifying fibrous tumor of the mesentery, treated lymphoma, or sequela of previous infection or trauma. No previous imaging of the abdomen and pelvis is available for comparison. Reproductive: Status post hysterectomy. No adnexal masses. Other: No free fluid or free intraperitoneal gas. No abdominal wall hernia. Musculoskeletal: Right hip arthroplasty. No acute or destructive bony lesions. Reconstructed images demonstrate no additional findings. Review of the MIP images confirms the above findings. IMPRESSION: 1. No evidence of thoracoabdominal aortic aneurysm or dissection. 2. Trace right pleural effusion. 3. Indeterminate partially calcified central mesenteric mass. There is a wide differential diagnosis, which includes carcinoid tumor, calcified fibrous tumor of the mesentery, treated lymphoma, or sequela of previous infection or trauma. There is no previous abdominal imaging available for comparison. Please correlate with any known oncologic history. 4. Distal colonic diverticulosis without diverticulitis. 5.  Aortic Atherosclerosis (ICD10-I70.0). Electronically Signed   By: Sharlet Salina M.D.   On: 01/02/2023 18:51   DG Chest Port 1 View  Result Date: 01/02/2023 CLINICAL DATA:  Chest pain EXAM: PORTABLE CHEST 1 VIEW COMPARISON:  03/22/21 cxr FINDINGS: Left-sided dual lead cardiac device in place with leads in the right atrium and right ventricle. Status post median sternotomy. Unchanged cardiac and mediastinal contours. New small right-sided pleural effusion. No pneumothorax. No focal airspace opacity. No  radiographically apparent displaced rib fractures. Visualized upper abdomen is unremarkable IMPRESSION: New small right-sided pleural effusion. Electronically Signed   By: Lorenza Cambridge M.D.   On: 01/02/2023 18:02     Scheduled Meds:  amiodarone  400 mg Oral BID   apixaban  5 mg Oral BID   diltiazem  120 mg Oral Daily   furosemide  20 mg Oral Daily   levothyroxine  50 mcg Oral q morning   metoprolol tartrate  50 mg Oral BID   Continuous Infusions:  sodium chloride Stopped (01/03/23 0242)     LOS: 2 days    Time spent:    Zannie Cove, MD [facility]   01/04/2023, 2:08 PM

## 2023-01-04 NOTE — Progress Notes (Signed)
  Echocardiogram 2D Echocardiogram has been performed.  Samantha Short 01/04/2023, 12:56 PM

## 2023-01-04 NOTE — Plan of Care (Signed)
  Problem: Education: Goal: Knowledge of General Education information will improve Description: Including pain rating scale, medication(s)/side effects and non-pharmacologic comfort measures Outcome: Progressing   Problem: Clinical Measurements: Goal: Ability to maintain clinical measurements within normal limits will improve Outcome: Progressing Goal: Will remain free from infection Outcome: Progressing   Problem: Nutrition: Goal: Adequate nutrition will be maintained Outcome: Progressing   Problem: Pain Managment: Goal: General experience of comfort will improve Outcome: Progressing   

## 2023-01-05 ENCOUNTER — Other Ambulatory Visit (HOSPITAL_COMMUNITY): Payer: Self-pay

## 2023-01-05 DIAGNOSIS — I350 Nonrheumatic aortic (valve) stenosis: Secondary | ICD-10-CM | POA: Diagnosis not present

## 2023-01-05 DIAGNOSIS — I471 Supraventricular tachycardia, unspecified: Secondary | ICD-10-CM | POA: Diagnosis not present

## 2023-01-05 DIAGNOSIS — I160 Hypertensive urgency: Secondary | ICD-10-CM | POA: Diagnosis not present

## 2023-01-05 DIAGNOSIS — I4891 Unspecified atrial fibrillation: Secondary | ICD-10-CM | POA: Diagnosis not present

## 2023-01-05 LAB — BASIC METABOLIC PANEL
Anion gap: 11 (ref 5–15)
BUN: 27 mg/dL — ABNORMAL HIGH (ref 8–23)
CO2: 21 mmol/L — ABNORMAL LOW (ref 22–32)
Calcium: 9.1 mg/dL (ref 8.9–10.3)
Chloride: 107 mmol/L (ref 98–111)
Creatinine, Ser: 1.18 mg/dL — ABNORMAL HIGH (ref 0.44–1.00)
GFR, Estimated: 48 mL/min — ABNORMAL LOW (ref >60)
Glucose, Bld: 88 mg/dL (ref 70–99)
Potassium: 4.3 mmol/L (ref 3.5–5.1)
Sodium: 139 mmol/L (ref 135–145)

## 2023-01-05 LAB — MAGNESIUM: Magnesium: 2.1 mg/dL (ref 1.7–2.4)

## 2023-01-05 MED ORDER — METOPROLOL TARTRATE 50 MG PO TABS
50.0000 mg | ORAL_TABLET | Freq: Two times a day (BID) | ORAL | 0 refills | Status: DC
  Filled 2023-01-05: qty 60, 30d supply, fill #0

## 2023-01-05 MED ORDER — AMIODARONE HCL 200 MG PO TABS
ORAL_TABLET | ORAL | 0 refills | Status: DC
  Filled 2023-01-05: qty 60, 25d supply, fill #0

## 2023-01-05 NOTE — Progress Notes (Addendum)
Discharge instructions reviewed with pt. Questions answered. Reviewed tapering dose of amiodarone, pt verbalized understanding of how to take.  Copy of instructions given to pt. [facility] filling 2 scripts for pt and will be picked up at the pharmacy on our way out for discharge.  Pt d/c'd via wheelchair with belongings, with family/friend will be at the main entrance.            Escorted by staff.  Samantha Kushnir,RN SWOT

## 2023-01-05 NOTE — Discharge Summary (Signed)
Physician Discharge Summary  Samantha Short:[id number] DOB: 29-Nov-1945 DOA: 01/02/2023  PCP: Mila Palmer, MD  Admit date: 01/02/2023 Discharge date: 01/05/2023  Time spent: 35 minutes  Recommendations for Outpatient Follow-up:  Cardiology Dr. Clifton James in 2 to 3 weeks, needs cardioversion if she remains in A-fib at follow-up Gastroenterology Dr. Levora Angel in few weeks for outpatient endoscopy and colonoscopy [place] [facility] for CT-guided biopsy of mesenteric mass   Discharge Diagnoses:  Principal Problem: Atrial fibrillation with RVR Incidentally found mesenteric calcified mass   Coronary artery disease   Hyperlipidemia   Hypertension   Chronic diastolic CHF (congestive heart failure) (HCC)   Hypothyroidism   Hypertensive urgency   Chronic atrial fibrillation with RVR (HCC)   Discharge Condition: Improved  Diet recommendation: Low-sodium, heart healthy  Filed Weights   01/02/23 1728 01/03/23 1353  Weight: 90 kg 91.1 kg    History of present illness:  77 y.o. female with history of CAD s/p 1V CABG in 2010, HTN, HLD, GERD, mitral regurgitation, chronic diastolic CHF, atrial fibrillation and high grade AV block s/p permanent pacemaker placement admitted with chest pain, hypertensive urgency and rapid atrial fibrillation.   Hospital Course:   A-fib with RVR -Converted to sinus rhythm, now back in Afib -Switched from IV to oral Cardizem, started oral amiodarone, heart rate improved, also continued on metoprolol and Eliquis -2D echo noted preserved EF, mild to moderate aortic stenosis -Continued on Eliquis -Cards recommended follow-up in 3 weeks, they will consider cardioversion if she is still in A-fib then   Acute on chronic diastolic CHF -Likely triggered by A-fib RVR, volume status has improved -sp IV Lasix x1 only -2D echo noted preserved EF, normal RV, mild to moderate aortic stenosis -Did not require further IV Lasix in the hospital,  switch to home regimen of 20 Mg oral Lasix daily, follow-up with cardiology, consider SGLT2i if she has further issues with volume overload   Chest pain/pressure -Likely secondary to above, no ACS   Hypertensive urgency -Resolved, dc nitro gtt   Moderate aortic stenosis -Mild to moderate on repeat echo   Indeterminant partially calcified central mesenteric mass -No prior history, no symptoms of carcinoid, no history of lymphoma, no prior trauma -discussed with [facility], recommended EGD/Colonoscopy as outpt with Surgical Onc FU -message sent to [facility] Dr. Levora Angel for outpatient endoscopy and colonoscopy, will request IR consult for outpatient biopsy at discharge   Consultations: Cards  Discharge Exam: Vitals:   01/04/23 2046 01/05/23 0435  BP: 126/71 128/77  Pulse: 73 72  Resp: 19 17  Temp: 97.6 F (36.4 C) 97.6 F (36.4 C)  SpO2: 99%    Gen: Awake, Alert, Oriented X 3,  HEENT: no JVD Lungs: Good air movement bilaterally, CTAB CVS: S1S2/irregular Abd: soft, Non tender, non distended, BS present Extremities: No edema Skin: no new rashes on exposed skin   Discharge Instructions   Discharge Instructions     Diet - low sodium heart healthy   Complete by: As directed    Increase activity slowly   Complete by: As directed       Allergies as of 01/05/2023       Reactions   Ceclor [cefaclor] Other (See Comments)   Lost vision in eye   Hctz [hydrochlorothiazide] Other (See Comments)   Renal insufficiency   Lipitor [atorvastatin Calcium] Other (See Comments)   Increased A1C   Norvasc [amlodipine] Other (See Comments)   Makes patient stiff   Penicillins Hives, Shortness Of Breath, Itching,  Rash   Sulfa Antibiotics Other (See Comments)   CAUSES SHOCK   Statins Other (See Comments)   MYALGIAS AND WEAKNESS   Milk-related Compounds Diarrhea, Nausea And Vomiting   Erythromycin Rash   Latex Rash   Levofloxacin Rash   Tetracyclines & Related Rash         Medication List     STOP taking these medications    isosorbide mononitrate 30 MG 24 hr tablet Commonly known as: IMDUR       TAKE these medications    acetaminophen 500 MG tablet Commonly known as: TYLENOL Take 500 mg by mouth every 6 (six) hours as needed for moderate pain.   amiodarone 200 MG tablet Commonly known as: PACERONE Take 2 tablets (400 mg total) by mouth 2 (two) times daily. Take 400 mg twice a day for 1 week followed by 200 mg twice a day for 2 weeks followed by 200 mg daily   apixaban 5 MG Tabs tablet Commonly known as: ELIQUIS Take 1 tablet (5 mg total) by mouth 2 (two) times daily.   cholecalciferol 25 MCG (1000 UNIT) tablet Commonly known as: VITAMIN D3 Take 1,000 Units by mouth daily.   cyanocobalamin 1000 MCG/ML injection Commonly known as: VITAMIN B12 Inject 1,000 mcg into the muscle every 30 (thirty) days.   diltiazem 120 MG 24 hr capsule Commonly known as: Cardizem CD Take 1 capsule (120 mg total) by mouth daily.   FISH OIL PO Take 1 capsule by mouth daily.   fluticasone 50 MCG/ACT nasal spray Commonly known as: FLONASE Place 2 sprays into both nostrils daily as needed for allergies or rhinitis.   furosemide 20 MG tablet Commonly known as: LASIX Take 1 tablet by mouth daily.   levothyroxine 50 MCG tablet Commonly known as: SYNTHROID Take 50 mcg by mouth every morning.   loratadine 10 MG tablet Commonly known as: CLARITIN Take 10 mg by mouth daily.   MELATONIN PO Take 1 tablet by mouth at bedtime.   metoprolol tartrate 50 MG tablet Commonly known as: LOPRESSOR Take 1 tablet (50 mg total) by mouth 2 (two) times daily.   multivitamin with minerals Tabs tablet Take 1 tablet by mouth daily.   nitroGLYCERIN 0.4 MG SL tablet Commonly known as: NITROSTAT PLACE 1 TABLET UNDER THE TONGUE EVERY 5 MINUTES AS NEEDED FOR CHEST PAIN   PreserVision AREDS 2 Caps Take 1 capsule by mouth 2 (two) times daily.       Allergies  Allergen  Reactions   Ceclor [Cefaclor] Other (See Comments)    Lost vision in eye   Hctz [Hydrochlorothiazide] Other (See Comments)    Renal insufficiency   Lipitor [Atorvastatin Calcium] Other (See Comments)    Increased A1C   Norvasc [Amlodipine] Other (See Comments)    Makes patient stiff   Penicillins Hives, Shortness Of Breath, Itching and Rash   Sulfa Antibiotics Other (See Comments)    CAUSES SHOCK   Statins Other (See Comments)    MYALGIAS AND WEAKNESS   Milk-Related Compounds Diarrhea and Nausea And Vomiting   Erythromycin Rash   Latex Rash   Levofloxacin Rash   Tetracyclines & Related Rash      The results of significant diagnostics from this hospitalization (including imaging, microbiology, ancillary and laboratory) are listed below for reference.    Significant Diagnostic Studies: ECHOCARDIOGRAM COMPLETE  Result Date: 01/04/2023    ECHOCARDIOGRAM REPORT   Patient Name:   DEIONA HOOPER Date of Exam: 01/04/2023 Medical Rec #:  [id number]  Height:       63.0 in Accession #:    [id number]      Weight:       200.9 lb Date of Birth:  1946/05/27      BSA:          1.937 m Patient Age:    76 years        BP:           136/69 mmHg Patient Gender: F               HR:           80 bpm. Exam Location:  Inpatient Procedure: 2D Echo, Cardiac Doppler and Color Doppler Indications:    Aortic stenosis  History:        Patient has prior history of Echocardiogram examinations, most                 recent 04/17/2022. CHF, CAD, Pacemaker and Prior CABG, Mitral                 Valve Disease and Aortic Valve Disease, Signs/Symptoms:Murmur;                 Risk Factors:Sleep Apnea, Hypertension and Dyslipidemia. CKD.  Sonographer:    Milda Smart Referring Phys: 74 CHRISTOPHER D MCALHANY  Sonographer Comments: Image acquisition challenging due to patient body habitus and Image acquisition challenging due to respiratory motion. IMPRESSIONS  1. Left ventricular ejection fraction, by estimation, is 60  to 65%. The left ventricle has normal function. The left ventricle has no regional wall motion abnormalities. Left ventricular diastolic function could not be evaluated.  2. Right ventricular systolic function is normal. The right ventricular size is normal. There is normal pulmonary artery systolic pressure. The estimated right ventricular systolic pressure is 28.2 mmHg.  3. The mitral valve is normal in structure. Mild mitral valve regurgitation. No evidence of mitral stenosis. Moderate mitral annular calcification.  4. The aortic valve is normal in structure. There is severe calcifcation of the aortic valve. There is severe thickening of the aortic valve. Aortic valve regurgitation is trivial. Mild to moderate aortic valve stenosis. Aortic valve area, by VTI measures 1.43 cm. Aortic valve mean gradient measures 17.0 mmHg. Aortic valve Vmax measures 2.52 m/s. DVI 0.41  5. The inferior vena cava is dilated in size with >50% respiratory variability, suggesting right atrial pressure of 8 mmHg. FINDINGS  Left Ventricle: Left ventricular ejection fraction, by estimation, is 60 to 65%. The left ventricle has normal function. The left ventricle has no regional wall motion abnormalities. The left ventricular internal cavity size was normal in size. There is  no left ventricular hypertrophy. Left ventricular diastolic function could not be evaluated due to atrial fibrillation. Left ventricular diastolic function could not be evaluated. Right Ventricle: The right ventricular size is normal. No increase in right ventricular wall thickness. Right ventricular systolic function is normal. There is normal pulmonary artery systolic pressure. The tricuspid regurgitant velocity is 2.25 m/s, and  with an assumed right atrial pressure of 8 mmHg, the estimated right ventricular systolic pressure is 28.2 mmHg. Left Atrium: Left atrial size was normal in size. Right Atrium: Right atrial size was normal in size. Pericardium: There is no  evidence of pericardial effusion. Mitral Valve: The mitral valve is normal in structure. Moderate mitral annular calcification. Mild mitral valve regurgitation. No evidence of mitral valve stenosis. Tricuspid Valve: The tricuspid valve is normal in structure. Tricuspid valve regurgitation is mild .  No evidence of tricuspid stenosis. Aortic Valve: The aortic valve is normal in structure. There is severe calcifcation of the aortic valve. There is severe thickening of the aortic valve. Aortic valve regurgitation is trivial. Mild to moderate aortic stenosis is present. Aortic valve mean  gradient measures 17.0 mmHg. Aortic valve peak gradient measures 25.4 mmHg. Aortic valve area, by VTI measures 1.43 cm. Pulmonic Valve: The pulmonic valve was normal in structure. Pulmonic valve regurgitation is trivial. No evidence of pulmonic stenosis. Aorta: The aortic root is normal in size and structure. Venous: The inferior vena cava is dilated in size with greater than 50% respiratory variability, suggesting right atrial pressure of 8 mmHg. IAS/Shunts: No atrial level shunt detected by color flow Doppler. Additional Comments: A venous catheter is visualized.  LEFT VENTRICLE PLAX 2D LVIDd:         3.70 cm LVIDs:         2.70 cm LV PW:         1.00 cm LV IVS:        1.00 cm LVOT diam:     2.10 cm LV SV:         78 LV SV Index:   40 LVOT Area:     3.46 cm  RIGHT VENTRICLE            IVC RV S prime:     6.31 cm/s  IVC diam: 2.30 cm LEFT ATRIUM             Index        RIGHT ATRIUM           Index LA diam:        4.20 cm 2.17 cm/m   RA Area:     17.90 cm LA Vol (A2C):   64.9 ml 33.50 ml/m  RA Volume:   51.00 ml  26.32 ml/m LA Vol (A4C):   54.0 ml 27.87 ml/m LA Biplane Vol: 59.2 ml 30.56 ml/m  AORTIC VALVE AV Area (Vmax):    1.55 cm AV Area (Vmean):   1.42 cm AV Area (VTI):     1.43 cm AV Vmax:           252.00 cm/s AV Vmean:          199.000 cm/s AV VTI:            0.543 m AV Peak Grad:      25.4 mmHg AV Mean Grad:       17.0 mmHg LVOT Vmax:         113.00 cm/s LVOT Vmean:        81.300 cm/s LVOT VTI:          0.224 m LVOT/AV VTI ratio: 0.41  AORTA Ao Root diam: 2.60 cm Ao Asc diam:  2.90 cm MITRAL VALVE                TRICUSPID VALVE MV Area (PHT): 3.03 cm     TR Peak grad:   20.2 mmHg MV Decel Time: 250 msec     TR Mean grad:   15.0 mmHg MR Peak grad: 67.6 mmHg     TR Vmax:        225.00 cm/s MR Vmax:      411.00 cm/s   TR Vmean:       186.0 cm/s MV E velocity: 101.00 cm/s  SHUNTS                             Systemic VTI:  0.22 m                             Systemic Diam: 2.10 cm Armanda Magic MD Electronically signed by Armanda Magic MD Signature Date/Time: 01/04/2023/1:27:59 PM    Final    CT Angio Chest/Abd/Pel for Dissection W and/or W/WO  Result Date: 01/02/2023 CLINICAL DATA:  Chest pain, right pleural effusion EXAM: CT ANGIOGRAPHY CHEST, ABDOMEN AND PELVIS TECHNIQUE: Non-contrast CT of the chest was initially obtained. Multidetector CT imaging through the chest, abdomen and pelvis was performed using the standard protocol during bolus administration of intravenous contrast. Multiplanar reconstructed images and MIPs were obtained and reviewed to evaluate the vascular anatomy. RADIATION DOSE REDUCTION: This exam was performed according to the departmental dose-optimization program which includes automated exposure control, adjustment of the mA and/or kV according to patient size and/or use of iterative reconstruction technique. CONTRAST:  OMNIPAQUE IOHEXOL 350 MG/ML SOLN COMPARISON:  01/02/2023, 03/22/2021 FINDINGS: CTA CHEST FINDINGS Cardiovascular: Evaluation of the thoracic aorta is limited by patient motion during the exam. No evidence of thoracic aortic aneurysm or dissection. Dual lead pacemaker is seen within the heart. No pericardial effusion. Timing of contrast bolus limits evaluation of the pulmonary vasculature. Mediastinum/Nodes: Calcified mediastinal and hilar lymph nodes  consistent with previous granulomatous disease. No pathologic adenopathy. Thyroid, trachea, and esophagus are unremarkable. Lungs/Pleura: Bilateral calcified granulomata are noted. Trace right pleural effusion. No airspace disease or pneumothorax. Central airways are patent. Musculoskeletal: No acute or destructive bony lesions. Reconstructed images demonstrate no additional findings. Review of the MIP images confirms the above findings. CTA ABDOMEN AND PELVIS FINDINGS VASCULAR Aorta: Normal caliber aorta without aneurysm, dissection, vasculitis or significant stenosis. Celiac: Patent without evidence of aneurysm, dissection, vasculitis or significant stenosis. SMA: Patent without evidence of aneurysm, dissection, vasculitis or significant stenosis. Renals: No duplicated bilateral renal arteries. Bilateral renal arteries are patent, without evidence of aneurysm, dissection, vasculitis, or fibromuscular dysplasia. No critical stenosis is identified. IMA: Patent without evidence of aneurysm, dissection, vasculitis or significant stenosis. Inflow: Patent without evidence of aneurysm, dissection, vasculitis or significant stenosis. Veins: No obvious venous abnormality within the limitations of this arterial phase study. Review of the MIP images confirms the above findings. NON-VASCULAR Hepatobiliary: No focal liver abnormality is seen. Status post cholecystectomy. No biliary dilatation. Pancreas: Unremarkable. No pancreatic ductal dilatation or surrounding inflammatory changes. Spleen: Normal in size without focal abnormality. Adrenals/Urinary Tract: No urinary tract calculi or obstructive uropathy within either kidney. Kidneys enhance normally and symmetrically. The adrenals and bladder are unremarkable. Stomach/Bowel: No bowel obstruction or ileus. Normal appendix right lower quadrant. Diverticulosis of the distal colon without diverticulitis. No bowel wall thickening or inflammatory change. Lymphatic: Multiple  subcentimeter mesenteric lymph nodes are identified. Within the central mesentery there is a partially calcified soft tissue mass measuring 4.5 x 2.8 cm in transverse dimension, reference image 154/6, and extending approximately 6 cm in craniocaudal extent. Portions of the mass demonstrate a mildly spiculated margin, while other borders remain lobular. Differential diagnosis would include carcinoid tumor, calcifying fibrous tumor of the mesentery, treated lymphoma, or sequela of previous infection or trauma. No previous imaging of the abdomen and pelvis is available for comparison. Reproductive: Status post hysterectomy. No adnexal masses. Other: No free fluid or free  intraperitoneal gas. No abdominal wall hernia. Musculoskeletal: Right hip arthroplasty. No acute or destructive bony lesions. Reconstructed images demonstrate no additional findings. Review of the MIP images confirms the above findings. IMPRESSION: 1. No evidence of thoracoabdominal aortic aneurysm or dissection. 2. Trace right pleural effusion. 3. Indeterminate partially calcified central mesenteric mass. There is a wide differential diagnosis, which includes carcinoid tumor, calcified fibrous tumor of the mesentery, treated lymphoma, or sequela of previous infection or trauma. There is no previous abdominal imaging available for comparison. Please correlate with any known oncologic history. 4. Distal colonic diverticulosis without diverticulitis. 5.  Aortic Atherosclerosis (ICD10-I70.0). Electronically Signed   By: Sharlet Salina M.D.   On: 01/02/2023 18:51   DG Chest Port 1 View  Result Date: 01/02/2023 CLINICAL DATA:  Chest pain EXAM: PORTABLE CHEST 1 VIEW COMPARISON:  03/22/21 cxr FINDINGS: Left-sided dual lead cardiac device in place with leads in the right atrium and right ventricle. Status post median sternotomy. Unchanged cardiac and mediastinal contours. New small right-sided pleural effusion. No pneumothorax. No focal airspace opacity. No  radiographically apparent displaced rib fractures. Visualized upper abdomen is unremarkable IMPRESSION: New small right-sided pleural effusion. Electronically Signed   By: Lorenza Cambridge M.D.   On: 01/02/2023 18:02   CUP PACEART REMOTE DEVICE CHECK  Result Date: 12/15/2022 Scheduled remote reviewed. Normal device function.  AF/AFL in progress from 4/3, controlled rates, burden 2.1%, Eliquis per EPIC 4 NSVT, 1 5 beat run, remainder atrial driven 1:1, 1 fast AV showing the same, duration 16sec, HR 162 Next remote 91 days. LA, CVRS   Microbiology: No results found for this or any previous visit (from the past 240 hour(s)).   Labs: Basic Metabolic Panel: Recent Labs  Lab 01/02/23 1732 01/02/23 1818 01/03/23 0050 01/05/23 0250  NA 140 139 137 139  K 4.3 4.9 4.1 4.3  CL 105 112* 105 107  CO2 22  --  22 21*  GLUCOSE 104* 97 101* 88  BUN 22 33* 20 27*  CREATININE 1.21* 1.00 1.21* 1.18*  CALCIUM 9.5  --  9.4 9.1  MG  --   --   --  2.1   Liver Function Tests: Recent Labs  Lab 01/02/23 1732  AST 29  ALT 35  ALKPHOS 88  BILITOT 0.7  PROT 7.2  ALBUMIN 3.6   No results for input(s): "LIPASE", "AMYLASE" in the last 168 hours. No results for input(s): "AMMONIA" in the last 168 hours. CBC: Recent Labs  Lab 01/02/23 1732 01/02/23 1818 01/03/23 0050  WBC 10.3  --  10.2  NEUTROABS 7.5  --   --   HGB 13.1 14.6 12.9  HCT 39.7 43.0 40.7  MCV 89.6  --  91.1  PLT 234  --  261   Cardiac Enzymes: No results for input(s): "CKTOTAL", "CKMB", "CKMBINDEX", "TROPONINI" in the last 168 hours. BNP: BNP (last 3 results) No results for input(s): "BNP" in the last 8760 hours.  ProBNP (last 3 results) No results for input(s): "PROBNP" in the last 8760 hours.  CBG: No results for input(s): "GLUCAP" in the last 168 hours.     Signed:  Zannie Cove MD.  [facility] 01/05/2023, 11:07 AM

## 2023-01-05 NOTE — Care Management Important Message (Signed)
Important Message  Patient Details  Name: Samantha Short MRN: [id number] Date of Birth: 1945/12/03   Medicare Important Message Given:  Yes     Renie Ora 01/05/2023, 1:21 PM

## 2023-01-05 NOTE — Progress Notes (Addendum)
Rounding Note    Patient Name: Samantha Short Date of Encounter: 01/05/2023  [facility] Cardiologist: Verne Carrow, MD   Subjective   Denies any chest discomfort or SOB.   Inpatient Medications    Scheduled Meds:  amiodarone  400 mg Oral BID   apixaban  5 mg Oral BID   diltiazem  120 mg Oral Daily   furosemide  20 mg Oral Daily   levothyroxine  50 mcg Oral q morning   metoprolol tartrate  50 mg Oral BID   Continuous Infusions:  sodium chloride Stopped (01/03/23 0242)   PRN Meds: acetaminophen **OR** acetaminophen, docusate sodium, melatonin, senna-docusate   Vital Signs    Vitals:   01/04/23 0722 01/04/23 1359 01/04/23 2046 01/05/23 0435  BP: 136/69 (!) 115/58 126/71 128/77  Pulse: 79 71 73 72  Resp: 18 18 19 17   Temp: 97.9 F (36.6 C) (!) 97.5 F (36.4 C) 97.6 F (36.4 C) 97.6 F (36.4 C)  TempSrc: Oral Oral Oral Oral  SpO2: 96% 98% 99%   Weight:      Height:       No intake or output data in the 24 hours ending 01/05/23 0755    01/03/2023    1:53 PM 01/02/2023    5:28 PM 10/17/2022    8:41 PM  Last 3 Weights  Weight (lbs) 200 lb 14.4 oz 198 lb 6.6 oz 198 lb  Weight (kg) 91.128 kg 90 kg 89.812 kg      Telemetry    Atrial fibrillation, HR 70 - Personally Reviewed  ECG    Atrial fibrillation, RBBB - Personally Reviewed  Physical Exam   GEN: No acute distress.   Neck: No JVD Cardiac: irregular, no murmurs, rubs, or gallops.  Respiratory: Clear to auscultation bilaterally. GI: Soft, nontender, non-distended  MS: No edema; No deformity. Neuro:  Nonfocal  Psych: Normal affect   Labs    High Sensitivity Troponin:   Recent Labs  Lab 01/02/23 1732 01/02/23 1955 01/03/23 0050 01/03/23 0233  TROPONINIHS 9 14 19* 16     Chemistry Recent Labs  Lab 01/02/23 1732 01/02/23 1818 01/03/23 0050 01/05/23 0250  NA 140 139 137 139  K 4.3 4.9 4.1 4.3  CL 105 112* 105 107  CO2 22  --  22 21*  GLUCOSE 104* 97 101* 88  BUN  22 33* 20 27*  CREATININE 1.21* 1.00 1.21* 1.18*  CALCIUM 9.5  --  9.4 9.1  MG  --   --   --  2.1  PROT 7.2  --   --   --   ALBUMIN 3.6  --   --   --   AST 29  --   --   --   ALT 35  --   --   --   ALKPHOS 88  --   --   --   BILITOT 0.7  --   --   --   GFRNONAA 46*  --  46* 48*  ANIONGAP 13  --  10 11    Lipids  Recent Labs  Lab 01/02/23 1732  CHOL 179  TRIG 50  HDL 62  LDLCALC 107*  CHOLHDL 2.9    Hematology Recent Labs  Lab 01/02/23 1732 01/02/23 1818 01/03/23 0050  WBC 10.3  --  10.2  RBC 4.43  --  4.47  HGB 13.1 14.6 12.9  HCT 39.7 43.0 40.7  MCV 89.6  --  91.1  MCH 29.6  --  28.9  MCHC 33.0  --  31.7  RDW 14.4  --  14.5  PLT 234  --  261   Thyroid No results for input(s): "TSH", "FREET4" in the last 168 hours.  BNPNo results for input(s): "BNP", "PROBNP" in the last 168 hours.  DDimer No results for input(s): "DDIMER" in the last 168 hours.   Radiology    ECHOCARDIOGRAM COMPLETE  Result Date: 01/04/2023    ECHOCARDIOGRAM REPORT   Patient Name:   Samantha Short Date of Exam: 01/04/2023 Medical Rec #:  [id number]       Height:       63.0 in Accession #:    [id number]      Weight:       200.9 lb Date of Birth:  06-10-1946      BSA:          1.937 m Patient Age:    76 years        BP:           136/69 mmHg Patient Gender: F               HR:           80 bpm. Exam Location:  Inpatient Procedure: 2D Echo, Cardiac Doppler and Color Doppler Indications:    Aortic stenosis  History:        Patient has prior history of Echocardiogram examinations, most                 recent 04/17/2022. CHF, CAD, Pacemaker and Prior CABG, Mitral                 Valve Disease and Aortic Valve Disease, Signs/Symptoms:Murmur;                 Risk Factors:Sleep Apnea, Hypertension and Dyslipidemia. CKD.  Sonographer:    Milda Smart Referring Phys: 98 Chanae Gemma D Ioana Louks  Sonographer Comments: Image acquisition challenging due to patient body habitus and Image acquisition challenging due  to respiratory motion. IMPRESSIONS  1. Left ventricular ejection fraction, by estimation, is 60 to 65%. The left ventricle has normal function. The left ventricle has no regional wall motion abnormalities. Left ventricular diastolic function could not be evaluated.  2. Right ventricular systolic function is normal. The right ventricular size is normal. There is normal pulmonary artery systolic pressure. The estimated right ventricular systolic pressure is 28.2 mmHg.  3. The mitral valve is normal in structure. Mild mitral valve regurgitation. No evidence of mitral stenosis. Moderate mitral annular calcification.  4. The aortic valve is normal in structure. There is severe calcifcation of the aortic valve. There is severe thickening of the aortic valve. Aortic valve regurgitation is trivial. Mild to moderate aortic valve stenosis. Aortic valve area, by VTI measures 1.43 cm. Aortic valve mean gradient measures 17.0 mmHg. Aortic valve Vmax measures 2.52 m/s. DVI 0.41  5. The inferior vena cava is dilated in size with >50% respiratory variability, suggesting right atrial pressure of 8 mmHg. FINDINGS  Left Ventricle: Left ventricular ejection fraction, by estimation, is 60 to 65%. The left ventricle has normal function. The left ventricle has no regional wall motion abnormalities. The left ventricular internal cavity size was normal in size. There is  no left ventricular hypertrophy. Left ventricular diastolic function could not be evaluated due to atrial fibrillation. Left ventricular diastolic function could not be evaluated. Right Ventricle: The right ventricular size is normal. No increase in right ventricular wall thickness. Right ventricular systolic  function is normal. There is normal pulmonary artery systolic pressure. The tricuspid regurgitant velocity is 2.25 m/s, and  with an assumed right atrial pressure of 8 mmHg, the estimated right ventricular systolic pressure is 28.2 mmHg. Left Atrium: Left atrial size  was normal in size. Right Atrium: Right atrial size was normal in size. Pericardium: There is no evidence of pericardial effusion. Mitral Valve: The mitral valve is normal in structure. Moderate mitral annular calcification. Mild mitral valve regurgitation. No evidence of mitral valve stenosis. Tricuspid Valve: The tricuspid valve is normal in structure. Tricuspid valve regurgitation is mild . No evidence of tricuspid stenosis. Aortic Valve: The aortic valve is normal in structure. There is severe calcifcation of the aortic valve. There is severe thickening of the aortic valve. Aortic valve regurgitation is trivial. Mild to moderate aortic stenosis is present. Aortic valve mean  gradient measures 17.0 mmHg. Aortic valve peak gradient measures 25.4 mmHg. Aortic valve area, by VTI measures 1.43 cm. Pulmonic Valve: The pulmonic valve was normal in structure. Pulmonic valve regurgitation is trivial. No evidence of pulmonic stenosis. Aorta: The aortic root is normal in size and structure. Venous: The inferior vena cava is dilated in size with greater than 50% respiratory variability, suggesting right atrial pressure of 8 mmHg. IAS/Shunts: No atrial level shunt detected by color flow Doppler. Additional Comments: A venous catheter is visualized.  LEFT VENTRICLE PLAX 2D LVIDd:         3.70 cm LVIDs:         2.70 cm LV PW:         1.00 cm LV IVS:        1.00 cm LVOT diam:     2.10 cm LV SV:         78 LV SV Index:   40 LVOT Area:     3.46 cm  RIGHT VENTRICLE            IVC RV S prime:     6.31 cm/s  IVC diam: 2.30 cm LEFT ATRIUM             Index        RIGHT ATRIUM           Index LA diam:        4.20 cm 2.17 cm/m   RA Area:     17.90 cm LA Vol (A2C):   64.9 ml 33.50 ml/m  RA Volume:   51.00 ml  26.32 ml/m LA Vol (A4C):   54.0 ml 27.87 ml/m LA Biplane Vol: 59.2 ml 30.56 ml/m  AORTIC VALVE AV Area (Vmax):    1.55 cm AV Area (Vmean):   1.42 cm AV Area (VTI):     1.43 cm AV Vmax:           252.00 cm/s AV Vmean:           199.000 cm/s AV VTI:            0.543 m AV Peak Grad:      25.4 mmHg AV Mean Grad:      17.0 mmHg LVOT Vmax:         113.00 cm/s LVOT Vmean:        81.300 cm/s LVOT VTI:          0.224 m LVOT/AV VTI ratio: 0.41  AORTA Ao Root diam: 2.60 cm Ao Asc diam:  2.90 cm MITRAL VALVE  TRICUSPID VALVE MV Area (PHT): 3.03 cm     TR Peak grad:   20.2 mmHg MV Decel Time: 250 msec     TR Mean grad:   15.0 mmHg MR Peak grad: 67.6 mmHg     TR Vmax:        225.00 cm/s MR Vmax:      411.00 cm/s   TR Vmean:       186.0 cm/s MV E velocity: 101.00 cm/s                             SHUNTS                             Systemic VTI:  0.22 m                             Systemic Diam: 2.10 cm Armanda Magic MD Electronically signed by Armanda Magic MD Signature Date/Time: 01/04/2023/1:27:59 PM    Final     Cardiac Studies   Echo 04/17/2022  1. The aortic valve is tricuspid. There is moderate calcification of the  aortic valve. There is moderate thickening of the aortic valve. Aortic  valve regurgitation is not visualized. Moderate aortic valve stenosis.  Aortic valve area, by VTI measures  1.15 cm. Aortic valve mean gradient measures 23.0 mmHg. Aortic valve Vmax  measures 3.16 m/s.   2. Left ventricular ejection fraction, by estimation, is 65 to 70%. The  left ventricle has normal function. The left ventricle has no regional  wall motion abnormalities. Left ventricular diastolic parameters are  consistent with Grade I diastolic  dysfunction (impaired relaxation).   3. Right ventricular systolic function is normal. The right ventricular  size is normal. There is normal pulmonary artery systolic pressure. The  estimated right ventricular systolic pressure is 26.4 mmHg.   4. The mitral valve is grossly normal. Trivial mitral valve  regurgitation. No evidence of mitral stenosis.   5. The inferior vena cava is normal in size with greater than 50%  respiratory variability, suggesting right atrial pressure of 3  mmHg.   Comparison(s): Changes from prior study are noted. AS is now moderate.     Patient Profile     77 y.o. female with PMH of CABG (LIMA to LAD in 2010), CHB s/p Medtr[employer] PPM last year, Afib, HFpEF, GERD, HLD and HTN who presented with hypertensive urgency and chest pain and was in afib with RVR.   Assessment & Plan    Chest pain             - occurred in the setting of hypertensive urgency and rapid afib. Serial trop negative. No dissection seen on CTA of chest and abdomen             - Last echo 04/17/2022 moderate AS with EF 65-70%             - Echo obtained on 01/04/2023 showed 60-65%, RVSP 28.2 mmHg, mild to moderate AS, mild MR.   - no further work up, chest pain resolved after HR under control. Likely can be discharged today. Will consider outpatient DCCV in a few weeks after loaded on amiodarone   Afib with RVR             - self converted on IV cardizem, placed back on home cardizem. Eliquis initially held,  however since chest pain resolved and serial trop negative, eliquis resumed             - converted back to afib, HR better controlled after metoprolol increased to 50 and amiodarone added. Plan to continue load amiodarone at 400mg  BID for 1 week, 200 mg BID for 2 weeks, then 200mg  daily thereafter. Follow up in clinic in 3 weeks to decide on outpatient DCCV.    Partially calcified central mesenteric mass: incidentally found on CT image. Per primary team, discussed with [facility] who recommended EGD/colonoscopy as outpatient with surgical oncology follow up              Hypertensive urgency: BP normalized.    CAD s/p CABG    Aortic stenosis: moderate on last echo 04/2022, pending repeat.    CHB s/p Medtronic PPM   HLD      For questions or updates, please contact [facility] Please consult [email or web address] for contact info under        Signed, Azalee Course, PA  01/05/2023, 7:55 AM    I have personally seen and examined this patient. I agree with the assessment  and plan as outlined above.  She is doing well today. She remains in atrial fib but rate is controlled. Will continue loading with amiodarone and then plan outpatient f/u in 3 weeks. Can plan DCCV as an outpatient if she remains in atrial fib. Aortic stenosis remains moderate. OK to d/c home today. We will arrange outpatient follow up in our office.   Verne Carrow, MD, [facility] 01/05/2023 9:21 AM

## 2023-01-05 NOTE — Plan of Care (Signed)
  Problem: Education: Goal: Knowledge of General Education information will improve Description: Including pain rating scale, medication(s)/side effects and non-pharmacologic comfort measures 01/05/2023 1112 by Herma Carson, RN Outcome: Adequate for Discharge 01/05/2023 0805 by Herma Carson, RN Outcome: Progressing   Problem: Health Behavior/Discharge Planning: Goal: Ability to manage health-related needs will improve 01/05/2023 1112 by Herma Carson, RN Outcome: Adequate for Discharge 01/05/2023 0805 by Herma Carson, RN Outcome: Progressing   Problem: Clinical Measurements: Goal: Ability to maintain clinical measurements within normal limits will improve 01/05/2023 1112 by Herma Carson, RN Outcome: Adequate for Discharge 01/05/2023 0805 by Herma Carson, RN Outcome: Progressing Goal: Will remain free from infection 01/05/2023 1112 by Herma Carson, RN Outcome: Adequate for Discharge 01/05/2023 0805 by Herma Carson, RN Outcome: Progressing Goal: Diagnostic test results will improve 01/05/2023 1112 by Herma Carson, RN Outcome: Adequate for Discharge 01/05/2023 0805 by Herma Carson, RN Outcome: Progressing Goal: Respiratory complications will improve 01/05/2023 1112 by Herma Carson, RN Outcome: Adequate for Discharge 01/05/2023 0805 by Herma Carson, RN Outcome: Progressing Goal: Cardiovascular complication will be avoided 01/05/2023 1112 by Herma Carson, RN Outcome: Adequate for Discharge 01/05/2023 0805 by Herma Carson, RN Outcome: Progressing   Problem: Activity: Goal: Risk for activity intolerance will decrease 01/05/2023 1112 by Herma Carson, RN Outcome: Adequate for Discharge 01/05/2023 0805 by Herma Carson, RN Outcome: Progressing   Problem: Nutrition: Goal: Adequate nutrition will be maintained 01/05/2023 1112 by Herma Carson, RN Outcome: Adequate for Discharge 01/05/2023 0805 by Herma Carson, RN Outcome: Progressing    Problem: Coping: Goal: Level of anxiety will decrease 01/05/2023 1112 by Herma Carson, RN Outcome: Adequate for Discharge 01/05/2023 0805 by Herma Carson, RN Outcome: Progressing   Problem: Elimination: Goal: Will not experience complications related to bowel motility 01/05/2023 1112 by Herma Carson, RN Outcome: Adequate for Discharge 01/05/2023 0805 by Herma Carson, RN Outcome: Progressing Goal: Will not experience complications related to urinary retention 01/05/2023 1112 by Herma Carson, RN Outcome: Adequate for Discharge 01/05/2023 0805 by Herma Carson, RN Outcome: Progressing   Problem: Pain Managment: Goal: General experience of comfort will improve 01/05/2023 1112 by Herma Carson, RN Outcome: Adequate for Discharge 01/05/2023 0805 by Herma Carson, RN Outcome: Progressing   Problem: Safety: Goal: Ability to remain free from injury will improve 01/05/2023 1112 by Herma Carson, RN Outcome: Adequate for Discharge 01/05/2023 0805 by Herma Carson, RN Outcome: Progressing   Problem: Skin Integrity: Goal: Risk for impaired skin integrity will decrease 01/05/2023 1112 by Herma Carson, RN Outcome: Adequate for Discharge 01/05/2023 0805 by Herma Carson, RN Outcome: Progressing

## 2023-01-05 NOTE — Plan of Care (Signed)

## 2023-01-08 NOTE — Progress Notes (Signed)
[name], MD  Zannie Cove, MD Cc: Leodis Rains D This biopsy is high bleeding risk due to proximity of mesenteric arterial branches.  I would recommend a DOTATATE PET/CT which if positive would be diagnostic of neuroendocrine tumor.  Thanks DDH

## 2023-01-08 NOTE — Progress Notes (Signed)
Kathi Der, MD  [name], MD Cc: Zannie Cove, MD; Georgeann Oppenheim . I will arrange for outpatient oncology referral for further work up .       Previous Messages    ----- Message ----- From: Zannie Cove, MD Sent: 01/08/2023  10:05 AM EDT To: [name], MD; Kathi Der, MD; * Subject: RE: CT BX                                      Ok.. got it, will add Dr.Brahmbhatt to this chat, I discharged her last week. Thank you Zannie Cove ----- Message ----- From: [name], MD Sent: 01/08/2023   9:15 AM EDT To: Zannie Cove, MD; Dorise Hiss Subject: FW: CT BX                                      This biopsy is high bleeding risk due to proximity of mesenteric arterial branches.  I would recommend a DOTATATE PET/CT which if positive would be diagnostic of neuroendocrine tumor.  Thanks DDH

## 2023-01-09 ENCOUNTER — Other Ambulatory Visit (HOSPITAL_COMMUNITY): Payer: Self-pay | Admitting: Gastroenterology

## 2023-01-09 DIAGNOSIS — K668 Other specified disorders of peritoneum: Secondary | ICD-10-CM

## 2023-01-10 ENCOUNTER — Inpatient Hospital Stay: Payer: Medicare HMO | Attending: Physician Assistant | Admitting: Physician Assistant

## 2023-01-10 ENCOUNTER — Encounter: Payer: Self-pay | Admitting: Physician Assistant

## 2023-01-10 ENCOUNTER — Telehealth: Payer: Self-pay | Admitting: Physician Assistant

## 2023-01-10 ENCOUNTER — Inpatient Hospital Stay: Payer: Medicare HMO

## 2023-01-10 ENCOUNTER — Ambulatory Visit: Payer: Medicare HMO | Admitting: Student

## 2023-01-10 ENCOUNTER — Other Ambulatory Visit: Payer: Medicare HMO

## 2023-01-10 ENCOUNTER — Other Ambulatory Visit: Payer: Self-pay

## 2023-01-10 VITALS — BP 128/64 | HR 68 | Temp 97.9°F | Resp 14 | Ht 63.0 in | Wt 204.6 lb

## 2023-01-10 DIAGNOSIS — I25118 Atherosclerotic heart disease of native coronary artery with other forms of angina pectoris: Secondary | ICD-10-CM | POA: Diagnosis not present

## 2023-01-10 DIAGNOSIS — N183 Chronic kidney disease, stage 3 unspecified: Secondary | ICD-10-CM | POA: Diagnosis not present

## 2023-01-10 DIAGNOSIS — K6389 Other specified diseases of intestine: Secondary | ICD-10-CM

## 2023-01-10 DIAGNOSIS — I130 Hypertensive heart and chronic kidney disease with heart failure and stage 1 through stage 4 chronic kidney disease, or unspecified chronic kidney disease: Secondary | ICD-10-CM | POA: Insufficient documentation

## 2023-01-10 DIAGNOSIS — K639 Disease of intestine, unspecified: Secondary | ICD-10-CM | POA: Diagnosis not present

## 2023-01-10 DIAGNOSIS — Z950 Presence of cardiac pacemaker: Secondary | ICD-10-CM | POA: Diagnosis not present

## 2023-01-10 DIAGNOSIS — I4891 Unspecified atrial fibrillation: Secondary | ICD-10-CM | POA: Diagnosis not present

## 2023-01-10 DIAGNOSIS — I700 Atherosclerosis of aorta: Secondary | ICD-10-CM | POA: Diagnosis not present

## 2023-01-10 DIAGNOSIS — Z7901 Long term (current) use of anticoagulants: Secondary | ICD-10-CM | POA: Diagnosis not present

## 2023-01-10 DIAGNOSIS — I5032 Chronic diastolic (congestive) heart failure: Secondary | ICD-10-CM | POA: Diagnosis not present

## 2023-01-10 DIAGNOSIS — I442 Atrioventricular block, complete: Secondary | ICD-10-CM | POA: Diagnosis not present

## 2023-01-10 DIAGNOSIS — I480 Paroxysmal atrial fibrillation: Secondary | ICD-10-CM | POA: Diagnosis not present

## 2023-01-10 DIAGNOSIS — Z809 Family history of malignant neoplasm, unspecified: Secondary | ICD-10-CM

## 2023-01-10 DIAGNOSIS — I350 Nonrheumatic aortic (valve) stenosis: Secondary | ICD-10-CM | POA: Diagnosis not present

## 2023-01-10 LAB — CBC WITH DIFFERENTIAL (CANCER CENTER ONLY)
Abs Immature Granulocytes: 0.02 10*3/uL (ref 0.00–0.07)
Basophils Absolute: 0.1 10*3/uL (ref 0.0–0.1)
Basophils Relative: 1 %
Eosinophils Absolute: 0.1 10*3/uL (ref 0.0–0.5)
Eosinophils Relative: 1 %
HCT: 37.4 % (ref 36.0–46.0)
Hemoglobin: 12.3 g/dL (ref 12.0–15.0)
Immature Granulocytes: 0 %
Lymphocytes Relative: 21 %
Lymphs Abs: 2 10*3/uL (ref 0.7–4.0)
MCH: 30 pg (ref 26.0–34.0)
MCHC: 32.9 g/dL (ref 30.0–36.0)
MCV: 91.2 fL (ref 80.0–100.0)
Monocytes Absolute: 0.7 10*3/uL (ref 0.1–1.0)
Monocytes Relative: 8 %
Neutro Abs: 6.7 10*3/uL (ref 1.7–7.7)
Neutrophils Relative %: 69 %
Platelet Count: 262 10*3/uL (ref 150–400)
RBC: 4.1 MIL/uL (ref 3.87–5.11)
RDW: 14.5 % (ref 11.5–15.5)
WBC Count: 9.7 10*3/uL (ref 4.0–10.5)
nRBC: 0 % (ref 0.0–0.2)

## 2023-01-10 LAB — CMP (CANCER CENTER ONLY)
ALT: 98 U/L — ABNORMAL HIGH (ref 0–44)
AST: 64 U/L — ABNORMAL HIGH (ref 15–41)
Albumin: 4 g/dL (ref 3.5–5.0)
Alkaline Phosphatase: 93 U/L (ref 38–126)
Anion gap: 6 (ref 5–15)
BUN: 25 mg/dL — ABNORMAL HIGH (ref 8–23)
CO2: 27 mmol/L (ref 22–32)
Calcium: 9.3 mg/dL (ref 8.9–10.3)
Chloride: 109 mmol/L (ref 98–111)
Creatinine: 1.52 mg/dL — ABNORMAL HIGH (ref 0.44–1.00)
GFR, Estimated: 35 mL/min — ABNORMAL LOW (ref >60)
Glucose, Bld: 115 mg/dL — ABNORMAL HIGH (ref 70–99)
Potassium: 4.9 mmol/L (ref 3.5–5.1)
Sodium: 142 mmol/L (ref 135–145)
Total Bilirubin: 0.4 mg/dL (ref 0.3–1.2)
Total Protein: 7 g/dL (ref 6.5–8.1)

## 2023-01-10 IMAGING — CT CT HEAD W/O CM
4 series · 17 of 47 positions shown, 19 images · non-contrast
Comparison: January 13, 2015

CLINICAL DATA: New worsening headache

EXAM:
CT HEAD WITHOUT CONTRAST
TECHNIQUE: Contiguous axial images were obtained from the base of the skull
through the vertex without intravenous contrast.

[Series 3: head without · axial · non-contrast · 0.43mm/px · z∈[-147,-27]mm · 7 of 32 slices shown, 9 images]
[im 4/32  brain]
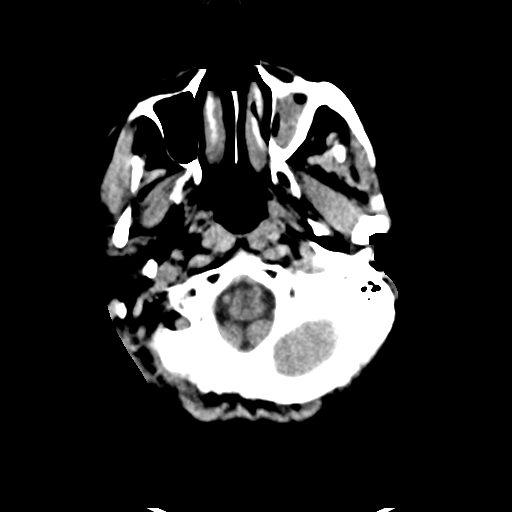
[im 4/32  bone]
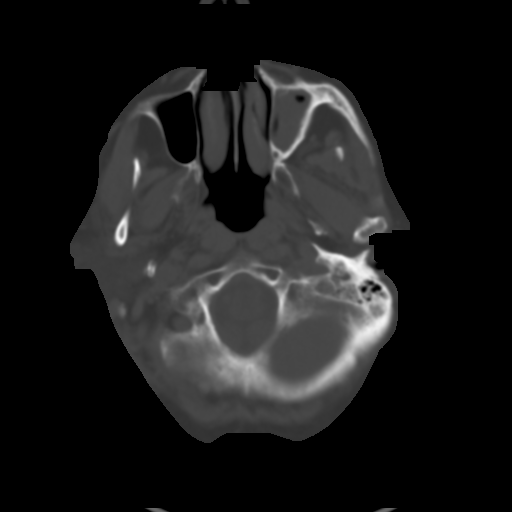
[im 8/32  brain]
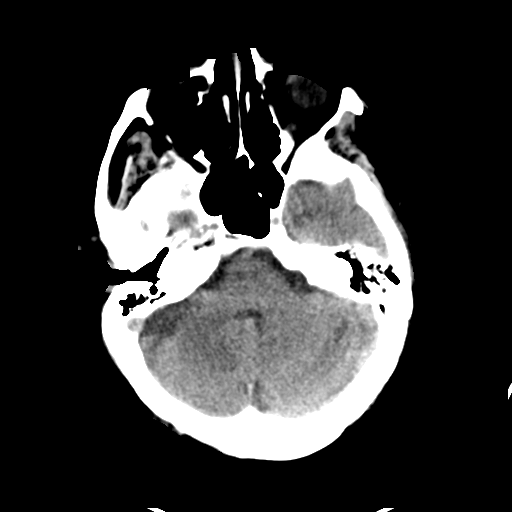
[im 12/32  brain]
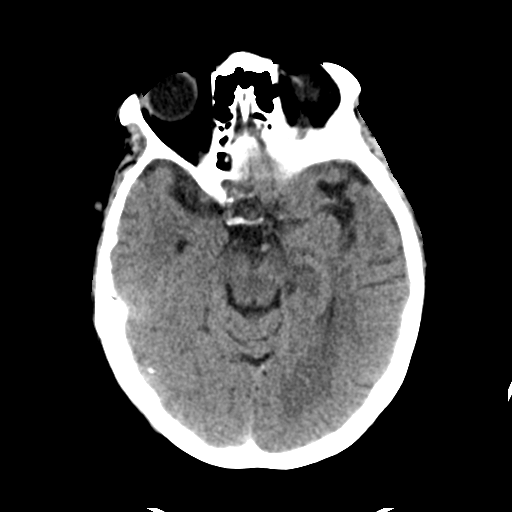
[im 16/32  brain]
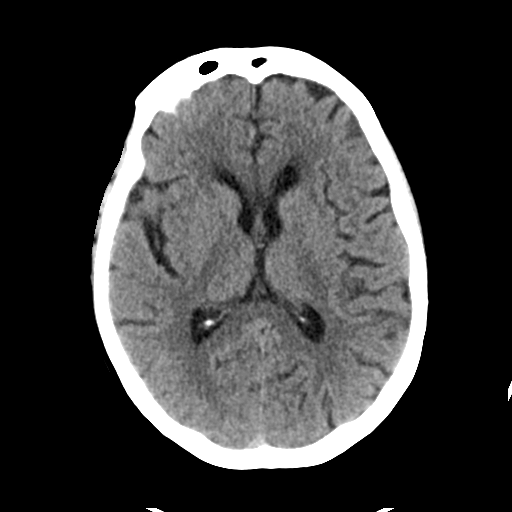
[im 20/32  brain]
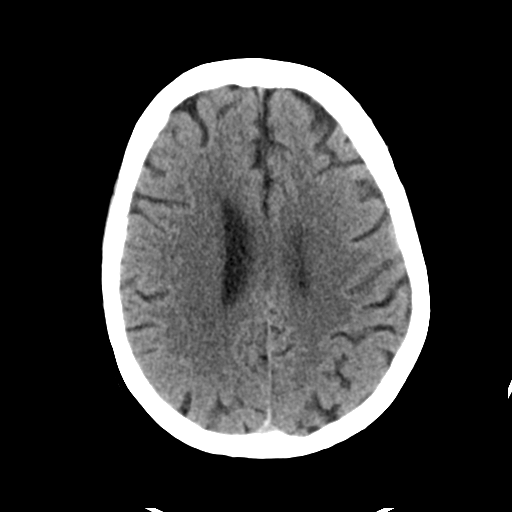
[im 20/32  bone]
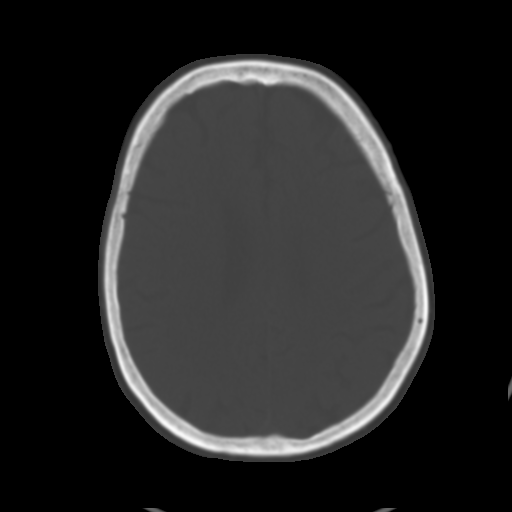
[im 24/32  brain]
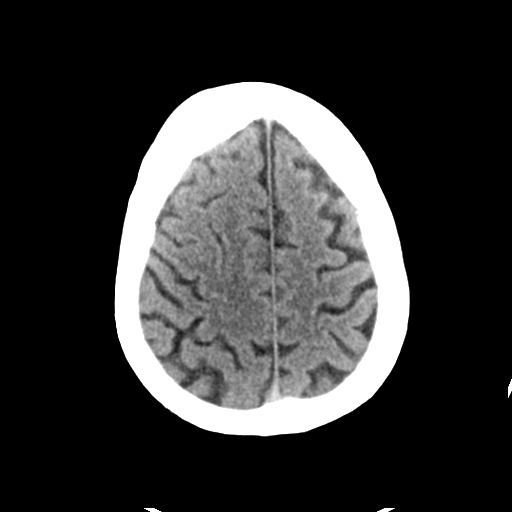
[im 28/32  brain]
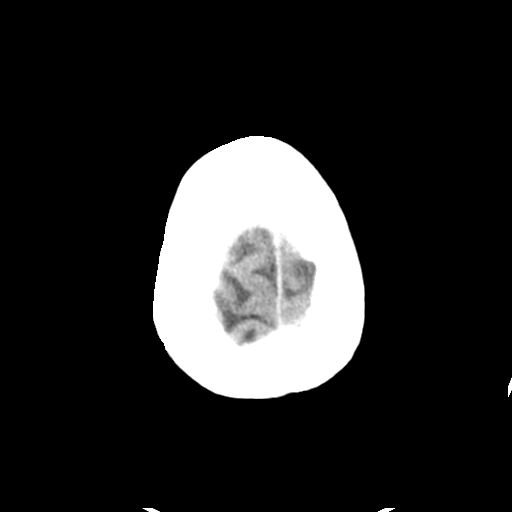

[Series 4: head bone · axial · 0.43mm/px · z∈[-148,-92]mm · 4 of 80 slices shown]
[im 8/80  bone]
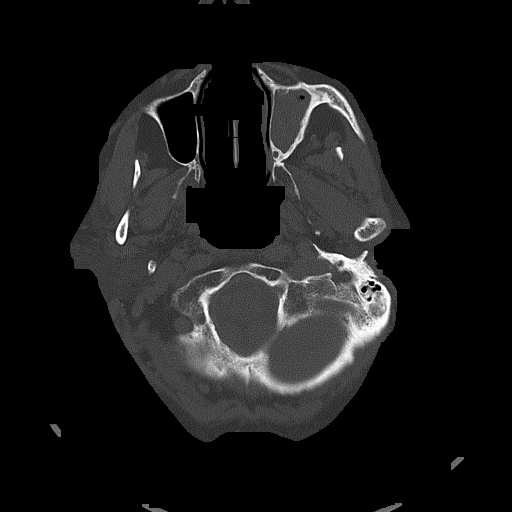
[im 16/80  bone]
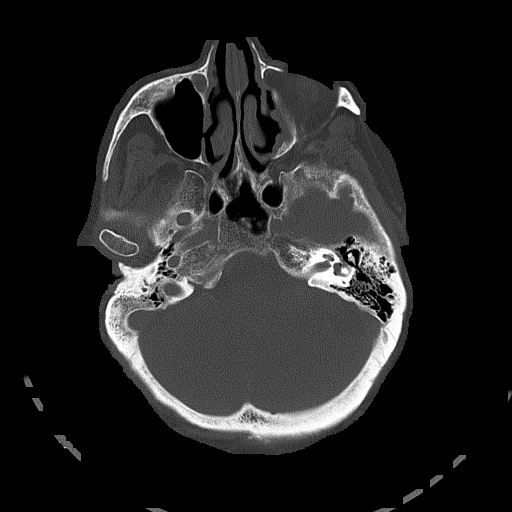
[im 24/80  bone]
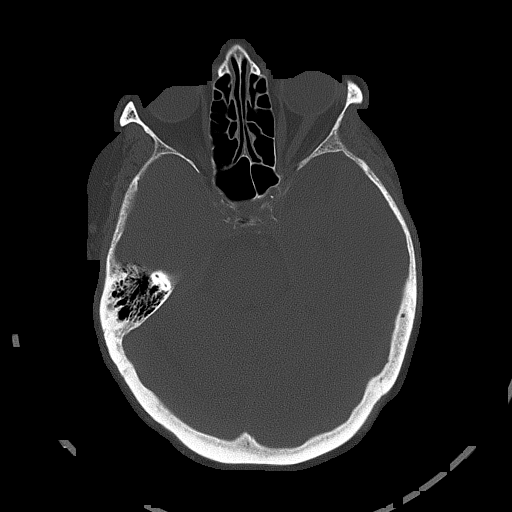
[im 36/80  bone]
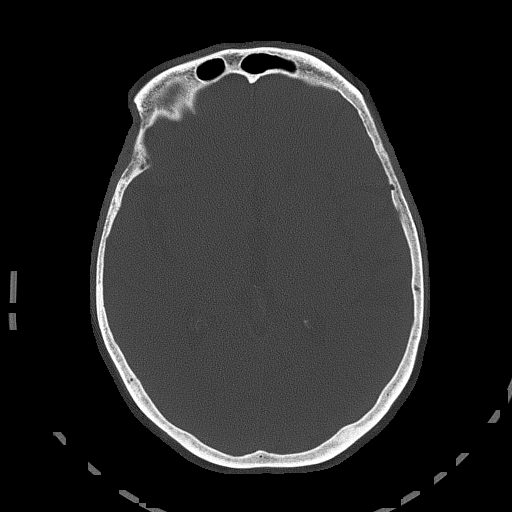

[Series 5: head without cor · coronal · non-contrast · 0.31mm/px · 3 of 67 slices shown]
[im 23/67  brain]
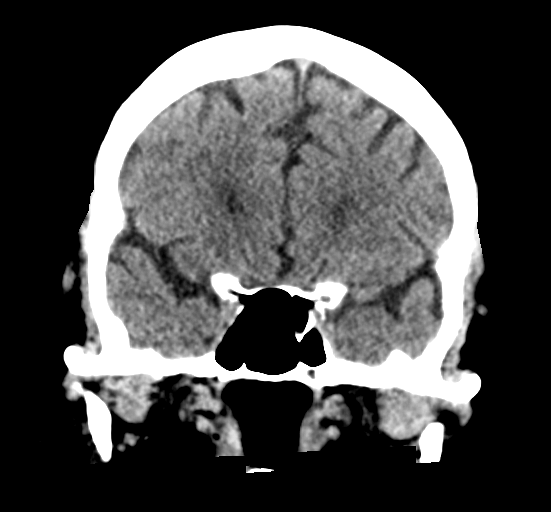
[im 30/67  brain]
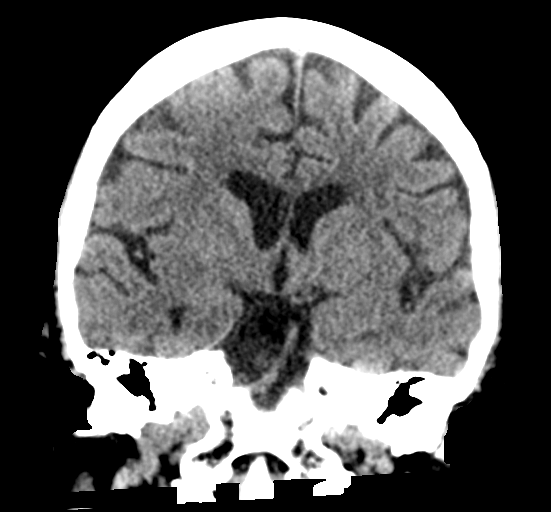
[im 37/67  brain]
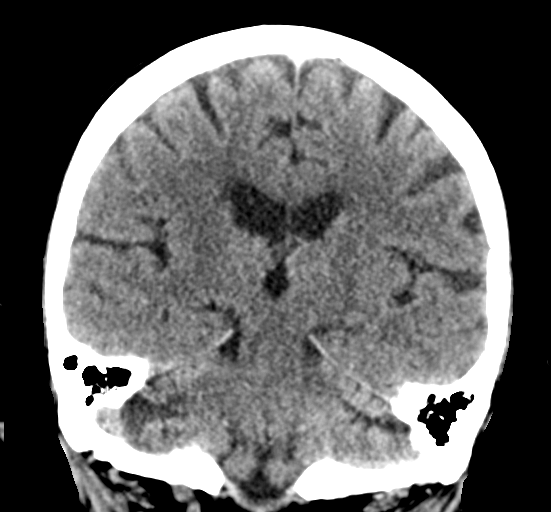

[Series 6: head without sag · sagittal · non-contrast · 0.35mm/px · 3 of 67 slices shown]
[im 23/67  brain]
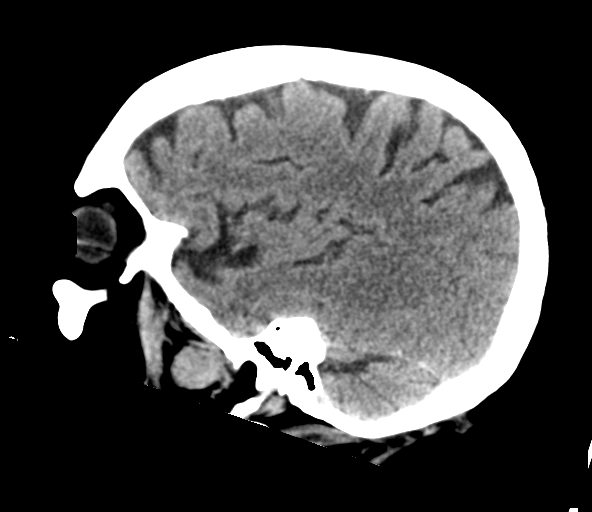
[im 34/67  brain]
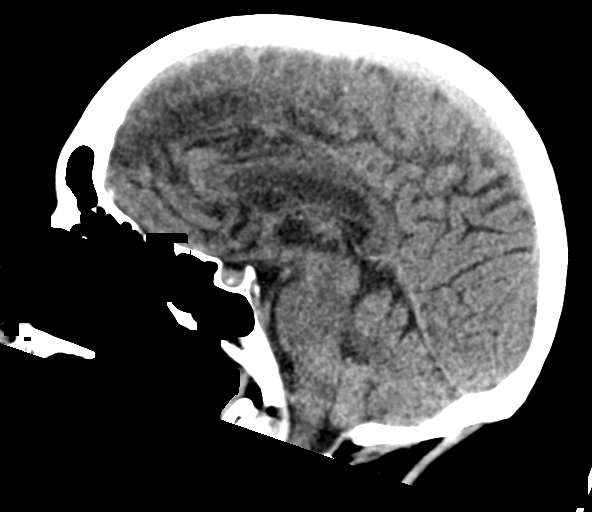
[im 45/67  brain]
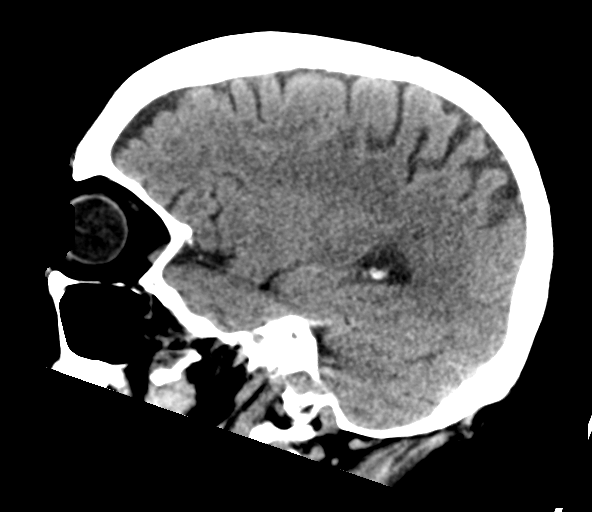

[17 of 47 positions shown; findings below may reference images not displayed]

FINDINGS: Brain: No significant volume loss for patient age. No evidence of
acute large vascular territory infarction, hemorrhage,
hydrocephalus, extra-axial collection or mass lesion/mass effect.

Vascular: No hyperdense vessel. Atherosclerotic calcifications the
internal arteries at skull base.

Skull: Normal. Negative for fracture or focal lesion.

Sinuses/Orbits: Similar near complete opacification of the left
maxillary sinus sclerotic wall thickening. The remainder of the
visualized paranasal sinuses and mastoid air cells are
predominantly. Prior lens surgery.

Other: Cerumen in the right external auditory canal.
IMPRESSION: 1. No acute intracranial abnormality.
2. Chronic left maxillary sinusitis.

## 2023-01-10 NOTE — Progress Notes (Unsigned)
[facility] [phone]   [phone]  INITIAL CONSULTATION:  Patient Care Team: Mila Palmer, MD as PCP - General (Family Medicine) Kathleene Hazel, MD as PCP - Cardiology (Cardiology)  CHIEF COMPLAINTS/PURPOSE OF CONSULTATION:  Mesenteric mass  HISTORY OF PRESENTING ILLNESS:  Samantha Short 77 y.o. female with medical history significant for CAD/CABG x1 2021, second-degree heart block sp PPM 2021, atrial fibrillation on anticoagulants, OSA on BiPAP, HLD, HTN, GERD, CKD stage III, hypothyroidism, anxiety and depression.  She presents to the diagnostic clinic for evaluation of a mesenteric mass seen on recent CT imaging.   On review of the previous records, [name] was admitted from 01/02/2023-01/05/2023 due to chest pain, hypertensive urgency and rapid atrial fibrillation. CT CAP angiogram was obtained and there was an incidental finding of a partially calcified central mesenteric mass.   On exam today, [name] reports that that she is not experiencing any abdominal pain. Several months ago, she did have some epigastric discomfort that last a couple of weeks and then resolved on its own. She reports her energy levels are fairly stable. She denies any appetite changes or weight loss. She does have some indigestion due to increased intake of fruits and vegetables. She denies nausea or vomiting. She does have constipation which is managed with colace and laxatives. She denies easy bruising or signs of active bleeding. She does have palpitations secondary to atrial fibrillation. She denies fevers, chills, sweats, shortness of breath, chest pain, flushing or cough. She has no other complaints. Rest of the 10 point ROS is below.   MEDICAL HISTORY:  Past Medical History:  Diagnosis Date   Anemia    pernicious   Anxiety    Arthritis    Asthma    Chronic diastolic CHF (congestive heart failure) (HCC)    a. 02/2011 Echo:  EF 55-60%, Gr2 DD, Mild MR   Chronic kidney disease    stage 3   Complication of anesthesia    Coronary artery disease    a. s/p CABG x 1 2010:  LIMA->LAD.;  b. amdx for CP => LHC 07/04/12: LAD 70-80%, mid RCA 30%, LIMA-LAD patent with competitive flow limiting distal LAD filling, EF 70% with hyperdynamic LV function. Medical therapy continued.   Depression    Dyslipidemia    Dysrhythmia    hx of Afib   GERD (gastroesophageal reflux disease)    Headache    hx of migraines   Heart murmur    History of hiatal hernia    History of kidney stones    Hyperlipidemia    Hypertension    Hyperthyroidism    Hypothyroidism    Mild aortic stenosis    Mitral regurgitation    a. mild by echo 02/2011.   Pneumonia    PONV (postoperative nausea and vomiting)    Presence of permanent cardiac pacemaker    Sleep apnea    diagnosed 08-2022    SURGICAL HISTORY: Past Surgical History:  Procedure Laterality Date   ABDOMINAL HYSTERECTOMY  1980   CARDIAC CATHETERIZATION  07/27/09 & 07/28/09   CESAREAN SECTION     x 2   CORONARY ARTERY BYPASS GRAFT  08/03/2009   x1 using left internal mammary artery to distal left anterior  descending coronary artery.    GALLBLADDER SURGERY  2001   LEFT HEART CATHETERIZATION WITH CORONARY/GRAFT ANGIOGRAM N/A 07/05/2012   Procedure: LEFT HEART CATHETERIZATION WITH Isabel Caprice;  Surgeon: Wendall Stade, MD;  Location: [facility]  CATH LAB;  Service: Cardiovascular;  Laterality: N/A;   PACEMAKER IMPLANT N/A 03/17/2020   Procedure: PACEMAKER IMPLANT;  Surgeon: Duke Salvia, MD;  Location: [facility] INVASIVE CV LAB;  Service: Cardiovascular;  Laterality: N/A;   RIGHT/LEFT HEART CATH AND CORONARY/GRAFT ANGIOGRAPHY N/A 03/17/2020   Procedure: RIGHT/LEFT HEART CATH AND CORONARY/GRAFT ANGIOGRAPHY;  Surgeon: Iran Ouch, MD;  Location: [facility] INVASIVE CV LAB;  Service: Cardiovascular;  Laterality: N/A;   SHOULDER SURGERY  1998 / 2001   from accident   rotator cuff   TOTAL  HIP ARTHROPLASTY Right 08/30/2022   Procedure: TOTAL HIP ARTHROPLASTY ANTERIOR APPROACH;  Surgeon: Ollen Gross, MD;  Location: [facility] ORS;  Service: Orthopedics;  Laterality: Right;   VESICOVAGINAL FISTULA CLOSURE W/ TAH  1998    SOCIAL HISTORY: Social History   Socioeconomic History   Marital status: Widowed    Spouse name: Not on file   Number of children: 3   Years of education: Not on file   Highest education level: Not on file  Occupational History   Occupation: Retired- Engineer, site  Tobacco Use   Smoking status: Former    Years: 7    Types: Cigarettes    Quit date: 07/12/2010    Years since quitting: 12.5   Smokeless tobacco: Never   Tobacco comments:    Never a heavy smoker 2 a day  Vaping Use   Vaping Use: Never used  Substance and Sexual Activity   Alcohol use: No   Drug use: No   Sexual activity: Not Currently  Other Topics Concern   Not on file  Social History Narrative   Not on file   Social Determinants of Health   Financial Resource Strain: Not on file  Food Insecurity: No Food Insecurity (01/03/2023)   Hunger Vital Sign    Worried About Running Out of Food in the Last Year: Never true    Ran Out of Food in the Last Year: Never true  Transportation Needs: No Transportation Needs (01/03/2023)   PRAPARE - Administrator, Civil Service (Medical): No    Lack of Transportation (Non-Medical): No  Physical Activity: Not on file  Stress: Not on file  Social Connections: Not on file  Intimate Partner Violence: Not At Risk (01/03/2023)   Humiliation, Afraid, Rape, and Kick questionnaire    Fear of Current or Ex-Partner: No    Emotionally Abused: No    Physically Abused: No    Sexually Abused: No    FAMILY HISTORY: Family History  Problem Relation Age of Onset   Heart attack Mother    Heart disease Mother        had pacemaker   High blood pressure Mother    High Cholesterol Mother    Obesity Mother    Cancer Father    Heart attack Father     High blood pressure Father    High Cholesterol Father    Heart disease Father    Alcoholism Father    Obesity Father    Heart disease Sister    Lung cancer Sister        metastases to brain   Lung cancer Sister    Breast cancer Sister    Cancer Brother        pituitary cancer   Heart disease Brother    Hypertension Brother    Coronary artery disease Son    Thyroid disease Neg Hx     ALLERGIES:  is allergic to ceclor [cefaclor], hctz [hydrochlorothiazide],  lipitor [atorvastatin calcium], norvasc [amlodipine], penicillins, sulfa antibiotics, statins, milk-related compounds, erythromycin, latex, levofloxacin, and tetracyclines & related.  MEDICATIONS:  Current Outpatient Medications  Medication Sig Dispense Refill   acetaminophen (TYLENOL) 500 MG tablet Take 500 mg by mouth every 6 (six) hours as needed for moderate pain.     amiodarone (PACERONE) 200 MG tablet Take 2 tablets (400 mg total) by mouth 2 (two) times daily for 7 days, THEN 1 tablet (200 mg total) 2 (two) times daily for 14 days, THEN 1 tablet (200 mg total) daily thereafter 60 tablet 0   apixaban (ELIQUIS) 5 MG TABS tablet Take 1 tablet (5 mg total) by mouth 2 (two) times daily. 180 tablet 3   cholecalciferol (VITAMIN D3) 25 MCG (1000 UNIT) tablet Take 1,000 Units by mouth daily.     cyanocobalamin (,VITAMIN B-12,) 1000 MCG/ML injection Inject 1,000 mcg into the muscle every 30 (thirty) days.     diltiazem (CARDIZEM CD) 120 MG 24 hr capsule Take 1 capsule (120 mg total) by mouth daily. 90 capsule 3   fluticasone (FLONASE) 50 MCG/ACT nasal spray Place 2 sprays into both nostrils daily as needed for allergies or rhinitis.     furosemide (LASIX) 20 MG tablet Take 1 tablet by mouth daily.     levothyroxine (SYNTHROID) 50 MCG tablet Take 50 mcg by mouth every morning.     loratadine (CLARITIN) 10 MG tablet Take 10 mg by mouth daily.     MELATONIN PO Take 1 tablet by mouth at bedtime.     metoprolol tartrate (LOPRESSOR) 50  MG tablet Take 1 tablet (50 mg total) by mouth 2 (two) times daily. 60 tablet 0   Multiple Vitamin (MULTIVITAMIN WITH MINERALS) TABS tablet Take 1 tablet by mouth daily.     Multiple Vitamins-Minerals (PRESERVISION AREDS 2) CAPS Take 1 capsule by mouth 2 (two) times daily.     nitroGLYCERIN (NITROSTAT) 0.4 MG SL tablet PLACE 1 TABLET UNDER THE TONGUE EVERY 5 MINUTES AS NEEDED FOR CHEST PAIN 25 tablet 0   Omega-3 Fatty Acids (FISH OIL PO) Take 1 capsule by mouth daily.     No current facility-administered medications for this visit.    REVIEW OF SYSTEMS:   Constitutional: ( - ) fevers, ( - )  chills , ( - ) night sweats Eyes: ( - ) blurriness of vision, ( - ) double vision, ( - ) watery eyes Ears, nose, mouth, throat, and face: ( - ) mucositis, ( - ) sore throat Respiratory: ( - ) cough, ( - ) dyspnea, ( - ) wheezes Cardiovascular: ( - ) palpitation, ( - ) chest discomfort, ( - ) lower extremity swelling Gastrointestinal:  ( - ) nausea, ( - ) heartburn, ( - ) change in bowel habits Skin: ( - ) abnormal skin rashes Lymphatics: ( - ) new lymphadenopathy, ( - ) easy bruising Neurological: ( - ) numbness, ( - ) tingling, ( - ) new weaknesses Behavioral/Psych: ( - ) mood change, ( - ) new changes  All other systems were reviewed with the patient and are negative.  PHYSICAL EXAMINATION: ECOG PERFORMANCE STATUS: 1 - Symptomatic but completely ambulatory  Vitals:   01/10/23 1539  BP: 128/64  Pulse: 68  Resp: 14  Temp: 97.9 F (36.6 C)  SpO2: 98%   Filed Weights   01/10/23 1539  Weight: 204 lb 9.6 oz (92.8 kg)    GENERAL: well appearing female in NAD  SKIN: skin color, texture, turgor are normal, no  rashes or significant lesions EYES: conjunctiva are pink and non-injected, sclera clear OROPHARYNX: no exudate, no erythema; lips, buccal mucosa, and tongue normal  NECK: supple, non-tender LYMPH:  no palpable lymphadenopathy in the cervical, axillary or supraclavicular lymph nodes.   LUNGS: clear to auscultation and percussion with normal breathing effort HEART: irregular rate & rhythm and no murmurs and no lower extremity edema ABDOMEN: soft, non-tender, non-distended, normal bowel sounds Musculoskeletal: no cyanosis of digits and no clubbing  PSYCH: alert & oriented x 3, fluent speech NEURO: no focal motor/sensory deficits  LABORATORY DATA:  I have reviewed the data as listed    Latest Ref Rng & Units 01/10/2023    3:26 PM 01/03/2023   12:50 AM 01/02/2023    6:18 PM  CBC  WBC 4.0 - 10.5 K/uL 9.7  10.2    Hemoglobin 12.0 - 15.0 g/dL 16.1  09.6  04.5   Hematocrit 36.0 - 46.0 % 37.4  40.7  43.0   Platelets 150 - 400 K/uL 262  261         Latest Ref Rng & Units 01/10/2023    3:26 PM 01/05/2023    2:50 AM 01/03/2023   12:50 AM  CMP  Glucose 70 - 99 mg/dL 409  88  811   BUN 8 - 23 mg/dL 25  27  20    Creatinine 0.44 - 1.00 mg/dL 9.14  7.82  9.56   Sodium 135 - 145 mmol/L 142  139  137   Potassium 3.5 - 5.1 mmol/L 4.9  4.3  4.1   Chloride 98 - 111 mmol/L 109  107  105   CO2 22 - 32 mmol/L 27  21  22    Calcium 8.9 - 10.3 mg/dL 9.3  9.1  9.4   Total Protein 6.5 - 8.1 g/dL 7.0     Total Bilirubin 0.3 - 1.2 mg/dL 0.4     Alkaline Phos 38 - 126 U/L 93     AST 15 - 41 U/L 64     ALT 0 - 44 U/L 98        RADIOGRAPHIC STUDIES: I have personally reviewed the radiological images as listed and agreed with the findings in the report. ECHOCARDIOGRAM COMPLETE  Result Date: 01/04/2023    ECHOCARDIOGRAM REPORT   Patient Name:   CEAIRA TAMBURRO Date of Exam: 01/04/2023 Medical Rec #:  [id number]       Height:       63.0 in Accession #:    [id number]      Weight:       200.9 lb Date of Birth:  January 03, 1946      BSA:          1.937 m Patient Age:    76 years        BP:           136/69 mmHg Patient Gender: F               HR:           80 bpm. Exam Location:  Inpatient Procedure: 2D Echo, Cardiac Doppler and Color Doppler Indications:    Aortic stenosis  History:        Patient has  prior history of Echocardiogram examinations, most                 recent 04/17/2022. CHF, CAD, Pacemaker and Prior CABG, Mitral  Valve Disease and Aortic Valve Disease, Signs/Symptoms:Murmur;                 Risk Factors:Sleep Apnea, Hypertension and Dyslipidemia. CKD.  Sonographer:    Milda Smart Referring Phys: 24 CHRISTOPHER D MCALHANY  Sonographer Comments: Image acquisition challenging due to patient body habitus and Image acquisition challenging due to respiratory motion. IMPRESSIONS  1. Left ventricular ejection fraction, by estimation, is 60 to 65%. The left ventricle has normal function. The left ventricle has no regional wall motion abnormalities. Left ventricular diastolic function could not be evaluated.  2. Right ventricular systolic function is normal. The right ventricular size is normal. There is normal pulmonary artery systolic pressure. The estimated right ventricular systolic pressure is 28.2 mmHg.  3. The mitral valve is normal in structure. Mild mitral valve regurgitation. No evidence of mitral stenosis. Moderate mitral annular calcification.  4. The aortic valve is normal in structure. There is severe calcifcation of the aortic valve. There is severe thickening of the aortic valve. Aortic valve regurgitation is trivial. Mild to moderate aortic valve stenosis. Aortic valve area, by VTI measures 1.43 cm. Aortic valve mean gradient measures 17.0 mmHg. Aortic valve Vmax measures 2.52 m/s. DVI 0.41  5. The inferior vena cava is dilated in size with >50% respiratory variability, suggesting right atrial pressure of 8 mmHg. FINDINGS  Left Ventricle: Left ventricular ejection fraction, by estimation, is 60 to 65%. The left ventricle has normal function. The left ventricle has no regional wall motion abnormalities. The left ventricular internal cavity size was normal in size. There is  no left ventricular hypertrophy. Left ventricular diastolic function could not be evaluated due to  atrial fibrillation. Left ventricular diastolic function could not be evaluated. Right Ventricle: The right ventricular size is normal. No increase in right ventricular wall thickness. Right ventricular systolic function is normal. There is normal pulmonary artery systolic pressure. The tricuspid regurgitant velocity is 2.25 m/s, and  with an assumed right atrial pressure of 8 mmHg, the estimated right ventricular systolic pressure is 28.2 mmHg. Left Atrium: Left atrial size was normal in size. Right Atrium: Right atrial size was normal in size. Pericardium: There is no evidence of pericardial effusion. Mitral Valve: The mitral valve is normal in structure. Moderate mitral annular calcification. Mild mitral valve regurgitation. No evidence of mitral valve stenosis. Tricuspid Valve: The tricuspid valve is normal in structure. Tricuspid valve regurgitation is mild . No evidence of tricuspid stenosis. Aortic Valve: The aortic valve is normal in structure. There is severe calcifcation of the aortic valve. There is severe thickening of the aortic valve. Aortic valve regurgitation is trivial. Mild to moderate aortic stenosis is present. Aortic valve mean  gradient measures 17.0 mmHg. Aortic valve peak gradient measures 25.4 mmHg. Aortic valve area, by VTI measures 1.43 cm. Pulmonic Valve: The pulmonic valve was normal in structure. Pulmonic valve regurgitation is trivial. No evidence of pulmonic stenosis. Aorta: The aortic root is normal in size and structure. Venous: The inferior vena cava is dilated in size with greater than 50% respiratory variability, suggesting right atrial pressure of 8 mmHg. IAS/Shunts: No atrial level shunt detected by color flow Doppler. Additional Comments: A venous catheter is visualized.  LEFT VENTRICLE PLAX 2D LVIDd:         3.70 cm LVIDs:         2.70 cm LV PW:         1.00 cm LV IVS:        1.00 cm LVOT  diam:     2.10 cm LV SV:         78 LV SV Index:   40 LVOT Area:     3.46 cm  RIGHT  VENTRICLE            IVC RV S prime:     6.31 cm/s  IVC diam: 2.30 cm LEFT ATRIUM             Index        RIGHT ATRIUM           Index LA diam:        4.20 cm 2.17 cm/m   RA Area:     17.90 cm LA Vol (A2C):   64.9 ml 33.50 ml/m  RA Volume:   51.00 ml  26.32 ml/m LA Vol (A4C):   54.0 ml 27.87 ml/m LA Biplane Vol: 59.2 ml 30.56 ml/m  AORTIC VALVE AV Area (Vmax):    1.55 cm AV Area (Vmean):   1.42 cm AV Area (VTI):     1.43 cm AV Vmax:           252.00 cm/s AV Vmean:          199.000 cm/s AV VTI:            0.543 m AV Peak Grad:      25.4 mmHg AV Mean Grad:      17.0 mmHg LVOT Vmax:         113.00 cm/s LVOT Vmean:        81.300 cm/s LVOT VTI:          0.224 m LVOT/AV VTI ratio: 0.41  AORTA Ao Root diam: 2.60 cm Ao Asc diam:  2.90 cm MITRAL VALVE                TRICUSPID VALVE MV Area (PHT): 3.03 cm     TR Peak grad:   20.2 mmHg MV Decel Time: 250 msec     TR Mean grad:   15.0 mmHg MR Peak grad: 67.6 mmHg     TR Vmax:        225.00 cm/s MR Vmax:      411.00 cm/s   TR Vmean:       186.0 cm/s MV E velocity: 101.00 cm/s                             SHUNTS                             Systemic VTI:  0.22 m                             Systemic Diam: 2.10 cm Armanda Magic MD Electronically signed by Armanda Magic MD Signature Date/Time: 01/04/2023/1:27:59 PM    Final    CT Angio Chest/Abd/Pel for Dissection W and/or W/WO  Result Date: 01/02/2023 CLINICAL DATA:  Chest pain, right pleural effusion EXAM: CT ANGIOGRAPHY CHEST, ABDOMEN AND PELVIS TECHNIQUE: Non-contrast CT of the chest was initially obtained. Multidetector CT imaging through the chest, abdomen and pelvis was performed using the standard protocol during bolus administration of intravenous contrast. Multiplanar reconstructed images and MIPs were obtained and reviewed to evaluate the vascular anatomy. RADIATION DOSE REDUCTION: This exam was performed according to the departmental dose-optimization program which includes automated exposure control,  adjustment of  the mA and/or kV according to patient size and/or use of iterative reconstruction technique. CONTRAST:  OMNIPAQUE IOHEXOL 350 MG/ML SOLN COMPARISON:  01/02/2023, 03/22/2021 FINDINGS: CTA CHEST FINDINGS Cardiovascular: Evaluation of the thoracic aorta is limited by patient motion during the exam. No evidence of thoracic aortic aneurysm or dissection. Dual lead pacemaker is seen within the heart. No pericardial effusion. Timing of contrast bolus limits evaluation of the pulmonary vasculature. Mediastinum/Nodes: Calcified mediastinal and hilar lymph nodes consistent with previous granulomatous disease. No pathologic adenopathy. Thyroid, trachea, and esophagus are unremarkable. Lungs/Pleura: Bilateral calcified granulomata are noted. Trace right pleural effusion. No airspace disease or pneumothorax. Central airways are patent. Musculoskeletal: No acute or destructive bony lesions. Reconstructed images demonstrate no additional findings. Review of the MIP images confirms the above findings. CTA ABDOMEN AND PELVIS FINDINGS VASCULAR Aorta: Normal caliber aorta without aneurysm, dissection, vasculitis or significant stenosis. Celiac: Patent without evidence of aneurysm, dissection, vasculitis or significant stenosis. SMA: Patent without evidence of aneurysm, dissection, vasculitis or significant stenosis. Renals: No duplicated bilateral renal arteries. Bilateral renal arteries are patent, without evidence of aneurysm, dissection, vasculitis, or fibromuscular dysplasia. No critical stenosis is identified. IMA: Patent without evidence of aneurysm, dissection, vasculitis or significant stenosis. Inflow: Patent without evidence of aneurysm, dissection, vasculitis or significant stenosis. Veins: No obvious venous abnormality within the limitations of this arterial phase study. Review of the MIP images confirms the above findings. NON-VASCULAR Hepatobiliary: No focal liver abnormality is seen. Status post  cholecystectomy. No biliary dilatation. Pancreas: Unremarkable. No pancreatic ductal dilatation or surrounding inflammatory changes. Spleen: Normal in size without focal abnormality. Adrenals/Urinary Tract: No urinary tract calculi or obstructive uropathy within either kidney. Kidneys enhance normally and symmetrically. The adrenals and bladder are unremarkable. Stomach/Bowel: No bowel obstruction or ileus. Normal appendix right lower quadrant. Diverticulosis of the distal colon without diverticulitis. No bowel wall thickening or inflammatory change. Lymphatic: Multiple subcentimeter mesenteric lymph nodes are identified. Within the central mesentery there is a partially calcified soft tissue mass measuring 4.5 x 2.8 cm in transverse dimension, reference image 154/6, and extending approximately 6 cm in craniocaudal extent. Portions of the mass demonstrate a mildly spiculated margin, while other borders remain lobular. Differential diagnosis would include carcinoid tumor, calcifying fibrous tumor of the mesentery, treated lymphoma, or sequela of previous infection or trauma. No previous imaging of the abdomen and pelvis is available for comparison. Reproductive: Status post hysterectomy. No adnexal masses. Other: No free fluid or free intraperitoneal gas. No abdominal wall hernia. Musculoskeletal: Right hip arthroplasty. No acute or destructive bony lesions. Reconstructed images demonstrate no additional findings. Review of the MIP images confirms the above findings. IMPRESSION: 1. No evidence of thoracoabdominal aortic aneurysm or dissection. 2. Trace right pleural effusion. 3. Indeterminate partially calcified central mesenteric mass. There is a wide differential diagnosis, which includes carcinoid tumor, calcified fibrous tumor of the mesentery, treated lymphoma, or sequela of previous infection or trauma. There is no previous abdominal imaging available for comparison. Please correlate with any known oncologic  history. 4. Distal colonic diverticulosis without diverticulitis. 5.  Aortic Atherosclerosis (ICD10-I70.0). Electronically Signed   By: Sharlet Salina M.D.   On: 01/02/2023 18:51   DG Chest Port 1 View  Result Date: 01/02/2023 CLINICAL DATA:  Chest pain EXAM: PORTABLE CHEST 1 VIEW COMPARISON:  03/22/21 cxr FINDINGS: Left-sided dual lead cardiac device in place with leads in the right atrium and right ventricle. Status post median sternotomy. Unchanged cardiac and mediastinal contours. New small right-sided pleural effusion. No pneumothorax.  No focal airspace opacity. No radiographically apparent displaced rib fractures. Visualized upper abdomen is unremarkable IMPRESSION: New small right-sided pleural effusion. Electronically Signed   By: Lorenza Cambridge M.D.   On: 01/02/2023 18:02    ASSESSMENT & PLAN Samantha Short is a 77 y.o. female who presents to the diagnostic clinic for evaluation for mesenteric mass. We discussed possible etiologies including benign mass, carcinoid tumor or possible lymphoma. Interventional radiology reviewed images and di not recommend a percutaneous biopsy as there is high bleeding risk due to proximity of mesenteric arterial branches. Patient is scheduled for a PET dotatate scan on 01/25/2023. We will proceed with labs today to check tumor markers including chromogranin, CEA and CA19-9 levels. If chromogranin levels are elevated, we will proceed with PET dotatate as ordered. If chromogranin levels are normal, we will switch to standard PET/CT Scan. Based on upcoming scans, we will evaluate for biopsy for tissue diagnosis.   Ms. Dorsey expressed understanding of the plan provided and has agreed to move forward with the workup.   #Mesenteric mass: --Labs today to check CBC, CMP, chromogranin, CEA and CA19-9 levels --PET dotatate scheduled for 01/25/2023 --Based on above workup, we will evaluate for biopsy for tissue diagnosis --RTC once workup is complete  #Family history of  cancer: --will send referral to genetics team for testing.   Orders Placed This Encounter  Procedures   CBC with Differential (Cancer Center Only)    Standing Status:   Future    Number of Occurrences:   1    Standing Expiration Date:   01/10/2024   CMP (Cancer Center only)    Standing Status:   Future    Number of Occurrences:   1    Standing Expiration Date:   01/10/2024   Chromogranin A    Standing Status:   Future    Number of Occurrences:   1    Standing Expiration Date:   01/10/2024   CEA (Access)-[facility] ONLY    Standing Status:   Future    Number of Occurrences:   1    Standing Expiration Date:   01/10/2024    All questions were answered. The patient knows to call the clinic with any problems, questions or concerns.  I have spent a total of 60 minutes minutes of face-to-face and non-face-to-face time, preparing to see the patient, obtaining and/or reviewing separately obtained history, performing a medically appropriate examination, counseling and educating the patient, ordering tests/procedures, referring and communicating with other health care professionals, documenting clinical information in the electronic health record,  and care coordination.   Georga Kaufmann, PA-C Department of Hematology/Oncology [facility] Phone: [phone]  Patient was seen with Dr. Candise Che

## 2023-01-10 NOTE — Telephone Encounter (Signed)
Reached out to patient to schedule Labs before seeing Karena Addison per [facility] request, patient going to come in at 11:30 for labs and she has a PCP appointment at 2 then will come back here for her visit with Karena Addison.

## 2023-01-11 LAB — CEA (ACCESS): CEA (CHCC): 4.32 ng/mL (ref 0.00–5.00)

## 2023-01-12 ENCOUNTER — Other Ambulatory Visit (HOSPITAL_COMMUNITY): Payer: Self-pay

## 2023-01-12 DIAGNOSIS — Z950 Presence of cardiac pacemaker: Secondary | ICD-10-CM | POA: Diagnosis not present

## 2023-01-12 DIAGNOSIS — Z8679 Personal history of other diseases of the circulatory system: Secondary | ICD-10-CM | POA: Diagnosis not present

## 2023-01-12 DIAGNOSIS — K668 Other specified disorders of peritoneum: Secondary | ICD-10-CM | POA: Diagnosis not present

## 2023-01-12 DIAGNOSIS — K219 Gastro-esophageal reflux disease without esophagitis: Secondary | ICD-10-CM | POA: Diagnosis not present

## 2023-01-12 DIAGNOSIS — Z862 Personal history of diseases of the blood and blood-forming organs and certain disorders involving the immune mechanism: Secondary | ICD-10-CM | POA: Diagnosis not present

## 2023-01-12 LAB — CHROMOGRANIN A: Chromogranin A (ng/mL): 240.8 ng/mL — ABNORMAL HIGH (ref 0.0–101.8)

## 2023-01-12 LAB — CANCER ANTIGEN 19-9: CA 19-9: 8 U/mL (ref 0–35)

## 2023-01-15 ENCOUNTER — Telehealth: Payer: Self-pay | Admitting: Oncology

## 2023-01-15 ENCOUNTER — Telehealth: Payer: Self-pay | Admitting: Genetic Counselor

## 2023-01-15 NOTE — Telephone Encounter (Signed)
Contacted patient to scheduled appointments. Patient is aware of appointments that are scheduled.   

## 2023-01-16 NOTE — Progress Notes (Signed)
Remote pacemaker transmission.   

## 2023-01-17 DIAGNOSIS — Z79899 Other long term (current) drug therapy: Secondary | ICD-10-CM | POA: Diagnosis not present

## 2023-01-17 DIAGNOSIS — I10 Essential (primary) hypertension: Secondary | ICD-10-CM | POA: Diagnosis not present

## 2023-01-17 DIAGNOSIS — E7800 Pure hypercholesterolemia, unspecified: Secondary | ICD-10-CM | POA: Diagnosis not present

## 2023-01-17 DIAGNOSIS — D510 Vitamin B12 deficiency anemia due to intrinsic factor deficiency: Secondary | ICD-10-CM | POA: Diagnosis not present

## 2023-01-17 DIAGNOSIS — I25118 Atherosclerotic heart disease of native coronary artery with other forms of angina pectoris: Secondary | ICD-10-CM | POA: Diagnosis not present

## 2023-01-17 DIAGNOSIS — E039 Hypothyroidism, unspecified: Secondary | ICD-10-CM | POA: Diagnosis not present

## 2023-01-17 DIAGNOSIS — R7303 Prediabetes: Secondary | ICD-10-CM | POA: Diagnosis not present

## 2023-01-17 DIAGNOSIS — E559 Vitamin D deficiency, unspecified: Secondary | ICD-10-CM | POA: Diagnosis not present

## 2023-01-17 LAB — LAB REPORT - SCANNED
A1c: 6
EGFR: 43

## 2023-01-19 ENCOUNTER — Other Ambulatory Visit (HOSPITAL_COMMUNITY): Payer: Self-pay

## 2023-01-19 ENCOUNTER — Other Ambulatory Visit: Payer: Self-pay

## 2023-01-19 MED ORDER — METOPROLOL TARTRATE 50 MG PO TABS: 50.0000 mg | ORAL_TABLET | Freq: Two times a day (BID) | ORAL | 0 refills | Status: DC

## 2023-01-19 MED ORDER — AMIODARONE HCL 200 MG PO TABS: ORAL_TABLET | ORAL | 0 refills | Status: DC

## 2023-01-19 NOTE — Telephone Encounter (Signed)
Pt's medications were sent to pt's pharmacy as requested. Confirmation received.  

## 2023-01-21 DIAGNOSIS — G4733 Obstructive sleep apnea (adult) (pediatric): Secondary | ICD-10-CM | POA: Diagnosis not present

## 2023-01-22 ENCOUNTER — Encounter: Payer: Self-pay | Admitting: Cardiovascular Disease

## 2023-01-22 DIAGNOSIS — E039 Hypothyroidism, unspecified: Secondary | ICD-10-CM | POA: Diagnosis not present

## 2023-01-22 DIAGNOSIS — I480 Paroxysmal atrial fibrillation: Secondary | ICD-10-CM | POA: Diagnosis not present

## 2023-01-22 DIAGNOSIS — E538 Deficiency of other specified B group vitamins: Secondary | ICD-10-CM | POA: Diagnosis not present

## 2023-01-22 DIAGNOSIS — N2581 Secondary hyperparathyroidism of renal origin: Secondary | ICD-10-CM | POA: Diagnosis not present

## 2023-01-22 DIAGNOSIS — Z0000 Encounter for general adult medical examination without abnormal findings: Secondary | ICD-10-CM | POA: Diagnosis not present

## 2023-01-22 DIAGNOSIS — I2510 Atherosclerotic heart disease of native coronary artery without angina pectoris: Secondary | ICD-10-CM | POA: Diagnosis not present

## 2023-01-25 ENCOUNTER — Encounter (HOSPITAL_COMMUNITY)
Admission: RE | Admit: 2023-01-25 | Discharge: 2023-01-25 | Disposition: A | Discharge status: Home / Self Care | Payer: Medicare HMO | Source: Ambulatory Visit | Attending: Gastroenterology | Admitting: Gastroenterology

## 2023-01-25 DIAGNOSIS — R918 Other nonspecific abnormal finding of lung field: Secondary | ICD-10-CM | POA: Diagnosis not present

## 2023-01-25 DIAGNOSIS — K668 Other specified disorders of peritoneum: Secondary | ICD-10-CM | POA: Insufficient documentation

## 2023-01-25 DIAGNOSIS — R1909 Other intra-abdominal and pelvic swelling, mass and lump: Secondary | ICD-10-CM | POA: Insufficient documentation

## 2023-01-25 DIAGNOSIS — C7B8 Other secondary neuroendocrine tumors: Secondary | ICD-10-CM | POA: Diagnosis not present

## 2023-01-25 DIAGNOSIS — C7A8 Other malignant neuroendocrine tumors: Secondary | ICD-10-CM | POA: Diagnosis not present

## 2023-01-25 DIAGNOSIS — D490 Neoplasm of unspecified behavior of digestive system: Secondary | ICD-10-CM | POA: Insufficient documentation

## 2023-01-25 DIAGNOSIS — R911 Solitary pulmonary nodule: Secondary | ICD-10-CM | POA: Insufficient documentation

## 2023-01-25 MED ORDER — COPPER CU 64 DOTATATE 1 MCI/ML IV SOLN
4.0000 | Freq: Once | INTRAVENOUS | Status: AC
  Administered 2023-01-25: 4 via INTRAVENOUS

## 2023-01-29 ENCOUNTER — Other Ambulatory Visit: Payer: Self-pay | Admitting: Physician Assistant

## 2023-01-29 ENCOUNTER — Telehealth: Payer: Self-pay | Admitting: Physician Assistant

## 2023-01-29 NOTE — Telephone Encounter (Signed)
I called Ms. Samantha Short to review the PET DOTATATE scan results from 01/25/2023. There is mild radiotracer activity associated with the central mesenteric mass to suggest a neuroendocrine tumor. Dr. Candise Che recommends a biopsy to confirm diagnosis and grade of the tumor. We will request EUS with GI for biopsy. Dr. Candise Che will follow up with patient once we obtain a biopsy. Patient expressed understanding of the plan provided.

## 2023-02-02 ENCOUNTER — Ambulatory Visit: Payer: Medicare HMO | Attending: Student | Admitting: Physician Assistant

## 2023-02-02 ENCOUNTER — Encounter: Payer: Self-pay | Admitting: Physician Assistant

## 2023-02-02 VITALS — BP 168/82 | HR 66 | Ht 62.0 in | Wt 204.0 lb

## 2023-02-02 DIAGNOSIS — Z79899 Other long term (current) drug therapy: Secondary | ICD-10-CM

## 2023-02-02 DIAGNOSIS — D6869 Other thrombophilia: Secondary | ICD-10-CM | POA: Diagnosis not present

## 2023-02-02 DIAGNOSIS — I10 Essential (primary) hypertension: Secondary | ICD-10-CM

## 2023-02-02 DIAGNOSIS — I480 Paroxysmal atrial fibrillation: Secondary | ICD-10-CM

## 2023-02-02 DIAGNOSIS — Z950 Presence of cardiac pacemaker: Secondary | ICD-10-CM

## 2023-02-02 DIAGNOSIS — I441 Atrioventricular block, second degree: Secondary | ICD-10-CM | POA: Diagnosis not present

## 2023-02-02 LAB — CUP PACEART INCLINIC DEVICE CHECK
Battery Remaining Longevity: 136 mo
Battery Voltage: 3.02 V
Brady Statistic AP VP Percent: 0.24 %
Brady Statistic AP VS Percent: 10.07 %
Brady Statistic AS VP Percent: 14.55 %
Brady Statistic AS VS Percent: 75.08 %
Brady Statistic RA Percent Paced: 9 %
Brady Statistic RV Percent Paced: 24.1 %
Date Time Interrogation Session: 20240524182629
Implantable Lead Connection Status: 753985
Implantable Lead Connection Status: 753985
Implantable Lead Implant Date: 20210707
Implantable Lead Implant Date: 20210707
Implantable Lead Location: 753859
Implantable Lead Location: 753860
Implantable Lead Model: 5076
Implantable Lead Model: 5076
Implantable Lead Serial Number: 8264443
Implantable Pulse Generator Implant Date: 20210707
Lead Channel Impedance Value: 323 Ohm
Lead Channel Impedance Value: 380 Ohm
Lead Channel Impedance Value: 437 Ohm
Lead Channel Impedance Value: 513 Ohm
Lead Channel Pacing Threshold Amplitude: 0.625 V
Lead Channel Pacing Threshold Amplitude: 1.125 V
Lead Channel Pacing Threshold Pulse Width: 0.4 ms
Lead Channel Pacing Threshold Pulse Width: 0.4 ms
Lead Channel Sensing Intrinsic Amplitude: 2.5 mV
Lead Channel Sensing Intrinsic Amplitude: 3.625 mV
Lead Channel Sensing Intrinsic Amplitude: 6.5 mV
Lead Channel Sensing Intrinsic Amplitude: 7 mV
Lead Channel Setting Pacing Amplitude: 1.5 V
Lead Channel Setting Pacing Amplitude: 2.5 V
Lead Channel Setting Pacing Pulse Width: 0.4 ms
Lead Channel Setting Sensing Sensitivity: 2.8 mV
Zone Setting Status: 755011
Zone Setting Status: 755011

## 2023-02-02 NOTE — Progress Notes (Signed)
Cardiology Office Note Date:  02/02/2023  Patient ID:  Samantha Short 04-20-1946, MRN [id number] PCP:  Mila Palmer, MD  Cardiologist:  Dr. Clifton James Electrophysiologist: Dr. Graciela Husbands    Chief Complaint:  6 mo  History of Present Illness: Samantha Short is a 77 y.o. female with history of CAD (CABG x1 in 2010, Cardiac cath October 2013 with LAD 70-80%, mid RCA 30%, LIMA-LAD patent, July 2021 with intermittent complete heart block. Cardiac cath with stable CAD, patent LIMA to LAD), HTN, HLD, GERD, chronic CHF (diastolic), CHB w/PPM, , hyperthyroid > ablation, CKD III, VHD w/AS  She saw Dr. Clifton James 04/13/22, feeling well, planned for an echo to f/u on her AS.  She saw Dr. Graciela Husbands 07/12/2022, DOE, fatigue, aggravated in the context of caring for her 2 siblings with cancer, and her grandchild who lives with her, who has autism. Pending hip surgery Noted PVCs her lisinopril stopped and started on dilt. AFib noted on her device 18 h, ASA stopped > Eliquis.  + sleep test  Admitted 01/02/23, c/p CP, palpitations, dx w/HTN urgency and rapid AFib. Did have spontaneous CV with dilt gtt > back in AF Amiodarone and metoprolol added, no ACS, to f/u 2-3 weeks or so to consider DCCV if still in AF Incidental finding partially calcified central mesenteric mass for outpt GI/PMD follow up Discharged 01/05/23 She saw oncology 01/10/23, discussed poss benign mass, carcinoid tumor or possible and lymphoma, planned for PET scan,labs, pending these +/- bx need  PET DOTATATE scan results from 01/25/2023. There is mild radiotracer activity associated with the central mesenteric mass to suggest a neuroendocrine tumor. Dr. Candise Che recommends a biopsy to confirm diagnosis and grade of the tumor    TODAY She comes today with many concerns, emotional. Understandably anxious/worried about the finding of a mass and diagnois' and health concerns. She has a sister and brother both in stage IV of different cancers, one  recently transitioning to Hospice. All of this very draining and somewhat overwhelming.  She is weary of the amiodarone, acute rise in her TSH and her kidney function Frustrated that we had not alerted her to the Afib episode prior to her having to go to the hospital C/w palpitations flutters, and intermittently painful heart beats.  No bleeding or signs of bleeding No SOB No near syncope or syncope.    Device information MDT dual chamber PPM implanted 03/17/20   Past Medical History:  Diagnosis Date   Anemia    pernicious   Anxiety    Arthritis    Asthma    Chronic diastolic CHF (congestive heart failure) (HCC)    a. 02/2011 Echo: EF 55-60%, Gr2 DD, Mild MR   Chronic kidney disease    stage 3   Complication of anesthesia    Coronary artery disease    a. s/p CABG x 1 2010:  LIMA->LAD.;  b. amdx for CP => LHC 07/04/12: LAD 70-80%, mid RCA 30%, LIMA-LAD patent with competitive flow limiting distal LAD filling, EF 70% with hyperdynamic LV function. Medical therapy continued.   Depression    Dyslipidemia    Dysrhythmia    hx of Afib   GERD (gastroesophageal reflux disease)    Headache    hx of migraines   Heart murmur    History of hiatal hernia    History of kidney stones    Hyperlipidemia    Hypertension    Hyperthyroidism    Hypothyroidism    Mild aortic stenosis  Mitral regurgitation    a. mild by echo 02/2011.   Pneumonia    PONV (postoperative nausea and vomiting)    Presence of permanent cardiac pacemaker    Sleep apnea    diagnosed 08-2022    Past Surgical History:  Procedure Laterality Date   ABDOMINAL HYSTERECTOMY  1980   CARDIAC CATHETERIZATION  07/27/09 & 07/28/09   CESAREAN SECTION     x 2   CORONARY ARTERY BYPASS GRAFT  08/03/2009   x1 using left internal mammary artery to distal left anterior  descending coronary artery.    GALLBLADDER SURGERY  2001   LEFT HEART CATHETERIZATION WITH CORONARY/GRAFT ANGIOGRAM N/A 07/05/2012   Procedure: LEFT  HEART CATHETERIZATION WITH Isabel Caprice;  Surgeon: Wendall Stade, MD;  Location: [facility] CATH LAB;  Service: Cardiovascular;  Laterality: N/A;   PACEMAKER IMPLANT N/A 03/17/2020   Procedure: PACEMAKER IMPLANT;  Surgeon: Duke Salvia, MD;  Location: [facility] INVASIVE CV LAB;  Service: Cardiovascular;  Laterality: N/A;   RIGHT/LEFT HEART CATH AND CORONARY/GRAFT ANGIOGRAPHY N/A 03/17/2020   Procedure: RIGHT/LEFT HEART CATH AND CORONARY/GRAFT ANGIOGRAPHY;  Surgeon: Iran Ouch, MD;  Location: [facility] INVASIVE CV LAB;  Service: Cardiovascular;  Laterality: N/A;   SHOULDER SURGERY  1998 / 2001   from accident   rotator cuff   TOTAL HIP ARTHROPLASTY Right 08/30/2022   Procedure: TOTAL HIP ARTHROPLASTY ANTERIOR APPROACH;  Surgeon: Ollen Gross, MD;  Location: [facility] ORS;  Service: Orthopedics;  Laterality: Right;   VESICOVAGINAL FISTULA CLOSURE W/ TAH  1998    Current Outpatient Medications  Medication Sig Dispense Refill   acetaminophen (TYLENOL) 500 MG tablet Take 500 mg by mouth every 6 (six) hours as needed for moderate pain.     amiodarone (PACERONE) 200 MG tablet Take 2 tablets (400 mg total) by mouth 2 (two) times daily for 7 days, THEN 1 tablet (200 mg total) 2 (two) times daily for 14 days, THEN 1 tablet (200 mg total) daily thereafter 60 tablet 0   apixaban (ELIQUIS) 5 MG TABS tablet Take 1 tablet (5 mg total) by mouth 2 (two) times daily. 180 tablet 3   cholecalciferol (VITAMIN D3) 25 MCG (1000 UNIT) tablet Take 1,000 Units by mouth daily.     cyanocobalamin (,VITAMIN B-12,) 1000 MCG/ML injection Inject 1,000 mcg into the muscle every 30 (thirty) days.     diltiazem (CARDIZEM CD) 120 MG 24 hr capsule Take 1 capsule (120 mg total) by mouth daily. 90 capsule 3   fluticasone (FLONASE) 50 MCG/ACT nasal spray Place 2 sprays into both nostrils daily as needed for allergies or rhinitis.     furosemide (LASIX) 20 MG tablet Take 1 tablet by mouth daily.     levothyroxine (SYNTHROID) 75 MCG tablet  Take 75 mcg by mouth daily before breakfast.     loratadine (CLARITIN) 10 MG tablet Take 10 mg by mouth daily.     MELATONIN PO Take 1 tablet by mouth at bedtime.     metoprolol tartrate (LOPRESSOR) 50 MG tablet Take 1 tablet (50 mg total) by mouth 2 (two) times daily. 60 tablet 0   Multiple Vitamin (MULTIVITAMIN WITH MINERALS) TABS tablet Take 1 tablet by mouth daily.     Multiple Vitamins-Minerals (PRESERVISION AREDS 2) CAPS Take 1 capsule by mouth 2 (two) times daily.     nitroGLYCERIN (NITROSTAT) 0.4 MG SL tablet PLACE 1 TABLET UNDER THE TONGUE EVERY 5 MINUTES AS NEEDED FOR CHEST PAIN 25 tablet 0   Omega-3 Fatty Acids (FISH OIL  PO) Take 1 capsule by mouth daily.     No current facility-administered medications for this visit.    Allergies:   Ceclor [cefaclor], Hctz [hydrochlorothiazide], Lipitor [atorvastatin calcium], Norvasc [amlodipine], Penicillins, Sulfa antibiotics, Statins, Milk-related compounds, Erythromycin, Latex, Levofloxacin, and Tetracyclines & related   Social History:  The patient  reports that she quit smoking about 12 years ago. Her smoking use included cigarettes. She has never used smokeless tobacco. She reports that she does not drink alcohol and does not use drugs.   Family History:  The patient's family history includes Alcoholism in her father; Breast cancer in her sister; Cancer in her brother and father; Coronary artery disease in her son; Heart attack in her father and mother; Heart disease in her brother, father, mother, and sister; High Cholesterol in her father and mother; High blood pressure in her father and mother; Hypertension in her brother; Lung cancer in her sister and sister; Obesity in her father and mother.  ROS:  Please see the history of present illness.    All other systems are reviewed and otherwise negative.   PHYSICAL EXAM:  VS:  BP (!) 168/82   Pulse 66   Ht 5\' 2"  (1.575 m)   Wt 204 lb (92.5 kg)   SpO2 98%   BMI 37.31 kg/m  BMI: Body mass  index is 37.31 kg/m. Well nourished, well developed, in no acute distress HEENT: normocephalic, atraumatic Neck: no JVD, carotid bruits or masses Cardiac:  RRR; 3/6 SM, no rubs, or gallops Lungs:  CTA b/l, no wheezing, rhonchi or rales Abd: soft, nontender MS: no deformity or atrophy Ext: no edema Skin: warm and dry, no rash Neuro:  No gross deficits appreciated Psych: euthymic mood, full affect  PPM site is stable, no tethering or discomfort  EKG:  Done today and reviewed by myself shows  SR 64bpm, RBBB (old)  Device interrogation done today and reviewed by myself:  Battery and lead measurements are good She is immediately aware of RV pacing, reproducing her "painful" beats AF burden 13.5% Last episode 01/08/22 lasted 5 days   01/04/23: TTE 1. Left ventricular ejection fraction, by estimation, is 60 to 65%. The  left ventricle has normal function. The left ventricle has no regional  wall motion abnormalities. Left ventricular diastolic function could not  be evaluated.   2. Right ventricular systolic function is normal. The right ventricular  size is normal. There is normal pulmonary artery systolic pressure. The  estimated right ventricular systolic pressure is 28.2 mmHg.   3. The mitral valve is normal in structure. Mild mitral valve  regurgitation. No evidence of mitral stenosis. Moderate mitral annular  calcification.   4. The aortic valve is normal in structure. There is severe calcifcation  of the aortic valve. There is severe thickening of the aortic valve.  Aortic valve regurgitation is trivial. Mild to moderate aortic valve  stenosis. Aortic valve area, by VTI  measures 1.43 cm. Aortic valve mean gradient measures 17.0 mmHg. Aortic  valve Vmax measures 2.52 m/s. DVI 0.41   5. The inferior vena cava is dilated in size with >50% respiratory  variability, suggesting right atrial pressure of 8 mmHg.   Echo 04/17/2022  1. The aortic valve is tricuspid. There is  moderate calcification of the  aortic valve. There is moderate thickening of the aortic valve. Aortic  valve regurgitation is not visualized. Moderate aortic valve stenosis.  Aortic valve area, by VTI measures  1.15 cm. Aortic valve mean gradient measures 23.0  mmHg. Aortic valve Vmax  measures 3.16 m/s.   2. Left ventricular ejection fraction, by estimation, is 65 to 70%. The  left ventricle has normal function. The left ventricle has no regional  wall motion abnormalities. Left ventricular diastolic parameters are  consistent with Grade I diastolic  dysfunction (impaired relaxation).   3. Right ventricular systolic function is normal. The right ventricular  size is normal. There is normal pulmonary artery systolic pressure. The  estimated right ventricular systolic pressure is 26.4 mmHg.   4. The mitral valve is grossly normal. Trivial mitral valve  regurgitation. No evidence of mitral stenosis.   5. The inferior vena cava is normal in size with greater than 50%  respiratory variability, suggesting right atrial pressure of 3 mmHg.   Comparison(s): Changes from prior study are noted. AS is now moderate.    Echo July 2021:  1. Left ventricular ejection fraction, by estimation, is 60 to 65%. The  left ventricle has normal function. The left ventricle has no regional  wall motion abnormalities. Mild septal-lateral dyssynchrony likely due to  RV pacing. Left ventricular  diastolic parameters are indeterminate.   2. The mitral valve is normal in structure. Mild mitral valve  regurgitation. No evidence of mitral stenosis.   3. The aortic valve is tricuspid. Aortic valve regurgitation is not  visualized. Mild aortic valve stenosis. Aortic valve area, by VTI measures  1.74 cm. Aortic valve mean gradient measures 17.0 mmHg.   4. Right ventricular systolic function is normal. The right ventricular  size is normal. Tricuspid regurgitation signal is inadequate for assessing  PA pressure.    5. The inferior vena cava is normal in size with <50% respiratory  variability, suggesting right atrial pressure of 8 mmHg.   6. Trivial pericardial effusion.   Recent Labs: 01/05/2023: Magnesium 2.1 01/10/2023: ALT 98; BUN 25; Creatinine 1.52; Hemoglobin 12.3; Platelet Count 262; Potassium 4.9; Sodium 142  01/02/2023: Cholesterol 179; HDL 62; LDL Cholesterol 107; Total CHOL/HDL Ratio 2.9; Triglycerides 50; VLDL 10   CrCl cannot be calculated (Patient's most recent lab result is older than the maximum 21 days allowed.).   Wt Readings from Last 3 Encounters:  02/02/23 204 lb (92.5 kg)  01/10/23 204 lb 9.6 oz (92.8 kg)  01/03/23 200 lb 14.4 oz (91.1 kg)     Other studies reviewed: Additional studies/records reviewed today include: summarized above  ASSESSMENT AND PLAN:  PPM Intsct function No programming changes made  Paroxysmal Afib CHA2DS2Vasc is 6, on Eliquis, appropriately dosed 13.5 % burden (pre-amiodarone) Amiodarone  05/04/22 TSH 3.98 01/17/23 14.82  01/10/23  AST 64 ALT 98  01/17/23 AST 24 ALT 41  She is very symptomatic/"painful or pounding" with RV pacing Will need to try and avoid RV pacing Currently 24% RV pacing May need to back off metoprolol and or amiodarone  Labs today to ensure LFT stabilization  Though currently she is in a very high emotional stressful state and think maintaining both if able is a good idea. As long as her TSH (s/p iodine tx) will allow, her PMD adjusted her synthroid with plans to repeat labs in a few weeks She is down to 200mg  daily, maintenance dose, TSH may stabilize as well  Will have her back in 6 weeks or so to follow up   CAD Intol statins, decined alternative tx No anginal symptoms C/w Dr. McAlhany/team  HTN A recheck 148/70  Chronic CHF (diastolic) She does not have symptoms or exam findings of volume OL  VHD  Mild-mod AS on her last echo  C/w Dr. McAlhany/team   Secondary hypercoagulable  state    Disposition: F/u with as above, sooner if needed  Current medicines are reviewed at length with the patient today.  The patient did not have any concerns regarding medicines.  Norma Fredrickson, PA-C 02/02/2023 5:15 PM     [facility] [address] [place] [address] [phone] (office)  [phone] (fax)

## 2023-02-02 NOTE — Patient Instructions (Signed)
Medication Instructions:   Your physician recommends that you continue on your current medications as directed. Please refer to the Current Medication list given to you today.   *If you need a refill on your cardiac medications before your next appointment, please call your pharmacy*   Lab Work:  CMET TODAY      If you have labs (blood work) drawn today and your tests are completely normal, you will receive your results only by: MyChart Message (if you have MyChart) OR A paper copy in the mail If you have any lab test that is abnormal or we need to change your treatment, we will call you to review the results.   Testing/Procedures: NONE ORDERED  TODAY     Follow-Up: At [facility], you and your health needs are our priority.  As part of our continuing mission to provide you with exceptional heart care, we have created designated Provider Care Teams.  These Care Teams include your primary Cardiologist (physician) and Advanced Practice Providers (APPs -  Physician Assistants and Nurse Practitioners) who all work together to provide you with the care you need, when you need it.  We recommend signing up for the patient portal called "MyChart".  Sign up information is provided on this After Visit Summary.  MyChart is used to connect with patients for Virtual Visits (Telemedicine).  Patients are able to view lab/test results, encounter notes, upcoming appointments, etc.  Non-urgent messages can be sent to your provider as well.   To learn more about what you can do with MyChart, go to [email or web address].    Your next appointment:   6 week(s)  Provider:   Sherryl Manges, MD or Francis Dowse, PA-C    Other Instructions

## 2023-02-03 LAB — COMPREHENSIVE METABOLIC PANEL
ALT: 30 IU/L (ref 0–32)
AST: 32 IU/L (ref 0–40)
Albumin/Globulin Ratio: 1.4 (ref 1.2–2.2)
Albumin: 4 g/dL (ref 3.8–4.8)
Alkaline Phosphatase: 99 IU/L (ref 44–121)
BUN/Creatinine Ratio: 22 (ref 12–28)
BUN: 25 mg/dL (ref 8–27)
Bilirubin Total: 0.3 mg/dL (ref 0.0–1.2)
CO2: 22 mmol/L (ref 20–29)
Calcium: 9.7 mg/dL (ref 8.7–10.3)
Chloride: 108 mmol/L — ABNORMAL HIGH (ref 96–106)
Creatinine, Ser: 1.15 mg/dL — ABNORMAL HIGH (ref 0.57–1.00)
Globulin, Total: 2.9 g/dL (ref 1.5–4.5)
Glucose: 91 mg/dL (ref 70–99)
Potassium: 4.5 mmol/L (ref 3.5–5.2)
Sodium: 146 mmol/L — ABNORMAL HIGH (ref 134–144)
Total Protein: 6.9 g/dL (ref 6.0–8.5)
eGFR: 49 mL/min/{1.73_m2} — ABNORMAL LOW (ref >59)

## 2023-02-06 ENCOUNTER — Emergency Department (HOSPITAL_COMMUNITY)
Admission: EM | Admit: 2023-02-06 | Discharge: 2023-02-06 | Disposition: A | Discharge status: Home / Self Care | Payer: Medicare HMO | Attending: Emergency Medicine | Admitting: Emergency Medicine

## 2023-02-06 ENCOUNTER — Other Ambulatory Visit: Payer: Self-pay

## 2023-02-06 ENCOUNTER — Encounter (HOSPITAL_COMMUNITY): Payer: Self-pay

## 2023-02-06 ENCOUNTER — Emergency Department (HOSPITAL_COMMUNITY): Payer: Medicare HMO

## 2023-02-06 DIAGNOSIS — Z96641 Presence of right artificial hip joint: Secondary | ICD-10-CM | POA: Diagnosis not present

## 2023-02-06 DIAGNOSIS — Z7901 Long term (current) use of anticoagulants: Secondary | ICD-10-CM | POA: Diagnosis not present

## 2023-02-06 DIAGNOSIS — Z91040 Latex allergy status: Secondary | ICD-10-CM | POA: Insufficient documentation

## 2023-02-06 DIAGNOSIS — R29898 Other symptoms and signs involving the musculoskeletal system: Secondary | ICD-10-CM | POA: Diagnosis not present

## 2023-02-06 DIAGNOSIS — M7062 Trochanteric bursitis, left hip: Secondary | ICD-10-CM | POA: Diagnosis not present

## 2023-02-06 DIAGNOSIS — Y939 Activity, unspecified: Secondary | ICD-10-CM | POA: Insufficient documentation

## 2023-02-06 DIAGNOSIS — M7072 Other bursitis of hip, left hip: Secondary | ICD-10-CM | POA: Diagnosis not present

## 2023-02-06 DIAGNOSIS — Z743 Need for continuous supervision: Secondary | ICD-10-CM | POA: Diagnosis not present

## 2023-02-06 DIAGNOSIS — Z471 Aftercare following joint replacement surgery: Secondary | ICD-10-CM | POA: Diagnosis not present

## 2023-02-06 DIAGNOSIS — Z96642 Presence of left artificial hip joint: Secondary | ICD-10-CM | POA: Diagnosis not present

## 2023-02-06 DIAGNOSIS — M25552 Pain in left hip: Secondary | ICD-10-CM | POA: Diagnosis not present

## 2023-02-06 DIAGNOSIS — I10 Essential (primary) hypertension: Secondary | ICD-10-CM | POA: Diagnosis not present

## 2023-02-06 LAB — COMPREHENSIVE METABOLIC PANEL
ALT: 32 U/L (ref 0–44)
AST: 31 U/L (ref 15–41)
Albumin: 3.9 g/dL (ref 3.5–5.0)
Alkaline Phosphatase: 82 U/L (ref 38–126)
Anion gap: 9 (ref 5–15)
BUN: 23 mg/dL (ref 8–23)
CO2: 25 mmol/L (ref 22–32)
Calcium: 9.4 mg/dL (ref 8.9–10.3)
Chloride: 101 mmol/L (ref 98–111)
Creatinine, Ser: 1.29 mg/dL — ABNORMAL HIGH (ref 0.44–1.00)
GFR, Estimated: 43 mL/min — ABNORMAL LOW (ref >60)
Glucose, Bld: 90 mg/dL (ref 70–99)
Potassium: 4.1 mmol/L (ref 3.5–5.1)
Sodium: 135 mmol/L (ref 135–145)
Total Bilirubin: 0.6 mg/dL (ref 0.3–1.2)
Total Protein: 7.7 g/dL (ref 6.5–8.1)

## 2023-02-06 LAB — CBC WITH DIFFERENTIAL/PLATELET
Abs Immature Granulocytes: 0.02 10*3/uL (ref 0.00–0.07)
Basophils Absolute: 0.1 10*3/uL (ref 0.0–0.1)
Basophils Relative: 1 %
Eosinophils Absolute: 0.1 10*3/uL (ref 0.0–0.5)
Eosinophils Relative: 1 %
HCT: 43.3 % (ref 36.0–46.0)
Hemoglobin: 13.7 g/dL (ref 12.0–15.0)
Immature Granulocytes: 0 %
Lymphocytes Relative: 28 %
Lymphs Abs: 2.2 10*3/uL (ref 0.7–4.0)
MCH: 28.6 pg (ref 26.0–34.0)
MCHC: 31.6 g/dL (ref 30.0–36.0)
MCV: 90.4 fL (ref 80.0–100.0)
Monocytes Absolute: 0.5 10*3/uL (ref 0.1–1.0)
Monocytes Relative: 7 %
Neutro Abs: 4.9 10*3/uL (ref 1.7–7.7)
Neutrophils Relative %: 63 %
Platelets: 255 10*3/uL (ref 150–400)
RBC: 4.79 MIL/uL (ref 3.87–5.11)
RDW: 14.7 % (ref 11.5–15.5)
WBC: 7.8 10*3/uL (ref 4.0–10.5)
nRBC: 0 % (ref 0.0–0.2)

## 2023-02-06 MED ORDER — KETOROLAC TROMETHAMINE 15 MG/ML IJ SOLN
15.0000 mg | Freq: Once | INTRAMUSCULAR | Status: AC
  Administered 2023-02-06: 15 mg via INTRAVENOUS
  Filled 2023-02-06: qty 1

## 2023-02-06 NOTE — ED Provider Triage Note (Signed)
Emergency Medicine Provider Triage Evaluation Note  Samantha Short , a 77 y.o. female  was evaluated in triage.  Pt complains of extremity weakness.  Patient is reporting this morning she did experience left lower extremity weakness originating at the hip.  Patient recently had right total hip replacement performed in December 2023.  She reports that she did not receive any physical therapy following surgery.  Not currently on anticoagulants.  Denies any bruising at the site of pain.  Denies any chest pain or shortness of breath.  Pain and weakness typically exacerbated by movement of the left leg.  Review of Systems  Positive: As above Negative: As above  Physical Exam  BP (!) 153/63 (BP Location: Right Arm)   Pulse 61   Temp 98.6 F (37 C) (Oral)   Resp 17   Ht 5\' 2"  (1.575 m)   Wt 92.5 kg   SpO2 99%   BMI 37.30 kg/m  Gen:   Awake, no distress   Resp:  Normal effort  MSK:   Pain with deep palpation of the left hip along the lateral aspect. Other:  Strength bilaterally in lower extremities.  Medical Decision Making  Medically screening exam initiated at 12:58 PM.  Appropriate orders placed.  Samantha Short was informed that the remainder of the evaluation will be completed by another provider, this initial triage assessment does not replace that evaluation, and the importance of remaining in the ED until their evaluation is complete.    Smitty Knudsen, PA-C 02/06/23 1300

## 2023-02-06 NOTE — ED Triage Notes (Signed)
Pt had R hip replacement in December. Working hard with PT but has been doing different exercises since her pool has been closed. Has been putting more strain on her L leg since the surgery and just felt like it gave out today. She lowered herself onto the couch. NIH neg, just feels like the leg is tired. No pain

## 2023-02-06 NOTE — Discharge Instructions (Addendum)
You were seen in the emergency department for pain and weakness in your left leg. Labs were reassuring an xray imaging was negative at this time. The area of pain you currently experience is consistent with trochanteric bursitis and I would advise managing this with anti-inflammatories, heat packs, and rest. After a few days, try to restart gentle activity as pain allows. Follow up with your PCP as needed. If symptoms worsen, return to the ER.

## 2023-02-06 NOTE — ED Provider Notes (Signed)
[place] EMERGENCY DEPARTMENT AT [facility] Provider Note   CSN: [id number] Arrival date & time: 02/06/23  1233     History Chief Complaint  Patient presents with   Extremity Weakness    Samantha Short is a 77 y.o. female.  Patient presented to the emergency department complaints of left leg weakness.  She reports that she recently had right hip replacement in December 2023.  She reports that since then she has been doing different types of physical therapy type activities to try to restrictive area.  She does not report that she is having some left hip tenderness and weakness in the left leg.  Denies any low back pain.  Denies any headaches, confusion, nausea, vomiting.  Denies any upper extremity weakness or any numbness or tingling throughout the body.   Extremity Weakness       Home Medications Prior to Admission medications   Medication Sig Start Date End Date Taking? Authorizing Provider  acetaminophen (TYLENOL) 500 MG tablet Take 500 mg by mouth every 6 (six) hours as needed for moderate pain.    [provider]  amiodarone (PACERONE) 200 MG tablet Take 2 tablets (400 mg total) by mouth 2 (two) times daily for 7 days, THEN 1 tablet (200 mg total) 2 (two) times daily for 14 days, THEN 1 tablet (200 mg total) daily thereafter 01/19/23 02/12/23  Duke Salvia, MD  apixaban (ELIQUIS) 5 MG TABS tablet Take 1 tablet (5 mg total) by mouth 2 (two) times daily. 07/12/22   Duke Salvia, MD  cholecalciferol (VITAMIN D3) 25 MCG (1000 UNIT) tablet Take 1,000 Units by mouth daily.    [provider]  cyanocobalamin (,VITAMIN B-12,) 1000 MCG/ML injection Inject 1,000 mcg into the muscle every 30 (thirty) days.    [provider]  diltiazem (CARDIZEM CD) 120 MG 24 hr capsule Take 1 capsule (120 mg total) by mouth daily. 07/12/22   Duke Salvia, MD  fluticasone [facility]) 50 MCG/ACT nasal spray Place 2 sprays into both nostrils daily as needed for  allergies or rhinitis.    [provider]  furosemide (LASIX) 20 MG tablet Take 1 tablet by mouth daily. 04/12/20   [provider]  levothyroxine (SYNTHROID) 75 MCG tablet Take 75 mcg by mouth daily before breakfast.    [provider]  loratadine (CLARITIN) 10 MG tablet Take 10 mg by mouth daily.    [provider]  MELATONIN PO Take 1 tablet by mouth at bedtime.    [provider]  metoprolol tartrate (LOPRESSOR) 50 MG tablet Take 1 tablet (50 mg total) by mouth 2 (two) times daily. 01/19/23   Duke Salvia, MD  Multiple Vitamin (MULTIVITAMIN WITH MINERALS) TABS tablet Take 1 tablet by mouth daily.    [provider]  Multiple Vitamins-Minerals (PRESERVISION AREDS 2) CAPS Take 1 capsule by mouth 2 (two) times daily.    [provider]  nitroGLYCERIN (NITROSTAT) 0.4 MG SL tablet PLACE 1 TABLET UNDER THE TONGUE EVERY 5 MINUTES AS NEEDED FOR CHEST PAIN 03/07/22   Kathleene Hazel, MD  Omega-3 Fatty Acids (FISH OIL PO) Take 1 capsule by mouth daily.    [provider]      Allergies    Ceclor [cefaclor], Hctz [hydrochlorothiazide], Lipitor [atorvastatin calcium], Norvasc [amlodipine], Penicillins, Sulfa antibiotics, Statins, Milk-related compounds, Erythromycin, Latex, Levofloxacin, and Tetracyclines & related    Review of Systems   Review of Systems  Musculoskeletal:  Positive for extremity weakness.  All other systems reviewed and are negative.   Physical Exam Updated Vital Signs BP (!) 147/74   Pulse 60   Temp (!) 97.5 F (36.4 C) (Oral)   Resp 15   Ht 5\' 2"  (1.575 m)   Wt 92.5 kg   SpO2 100%   BMI 37.30 kg/m  Physical Exam Vitals and nursing note reviewed.  Constitutional:      General: She is not in acute distress.    Appearance: She is well-developed.  HENT:     Head: Normocephalic and atraumatic.  Eyes:     Conjunctiva/sclera: Conjunctivae normal.  Cardiovascular:     Rate and Rhythm: Normal  rate and regular rhythm.     Heart sounds: No murmur heard. Pulmonary:     Effort: Pulmonary effort is normal. No respiratory distress.     Breath sounds: Normal breath sounds.  Abdominal:     Palpations: Abdomen is soft.     Tenderness: There is no abdominal tenderness.  Musculoskeletal:        General: No swelling.     Cervical back: Neck supple.  Skin:    General: Skin is warm and dry.     Capillary Refill: Capillary refill takes less than 2 seconds.  Neurological:     General: No focal deficit present.     Mental Status: She is alert.     Motor: No weakness.     Comments: Pain with range of motion testing of the left lower leg at the hip.  Weakness appears to be brought on by resisted range of motion testing.  CN III-XII intact.  Psychiatric:        Mood and Affect: Mood normal.     ED Results / Procedures / Treatments   Labs (all labs ordered are listed, but only abnormal results are displayed) Labs Reviewed  COMPREHENSIVE METABOLIC PANEL - Abnormal; Notable for the following components:      Result Value   Creatinine, Ser 1.29 (*)    GFR, Estimated 43 (*)    All other components within normal limits  CBC WITH DIFFERENTIAL/PLATELET    EKG None  Radiology DG Hip Unilat W or Wo Pelvis 2-3 Views Left  Result Date: 02/06/2023 CLINICAL DATA:  Hip pain, no known injury EXAM: DG HIP (WITH OR WITHOUT PELVIS) 2-3V LEFT COMPARISON:  None Available. FINDINGS: Osteopenia. There is no evidence of displaced hip fracture or dislocation. Status post right hip total arthroplasty. Moderate superior joint space narrowing and osteophytosis of the left hip. Nonobstructive pattern of overlying bowel gas. IMPRESSION: 1. Osteopenia. No evidence of displaced hip fracture or dislocation of the left hip. 2.  Moderate left hip arthrosis. 3.  Status post right hip total arthroplasty. Electronically Signed   By: Jearld Lesch M.D.   On: 02/06/2023 13:39    Procedures Procedures   Medications  Ordered in ED Medications  ketorolac (TORADOL) 15 MG/ML injection 15 mg (15 mg Intravenous Given 02/06/23 1654)    ED Course/ Medical Decision Making/ A&P                           Medical Decision Making Amount and/or Complexity of Data Reviewed Labs: ordered. Radiology: ordered.  Risk Prescription drug management.   This patient presents to the ED for concern of extremity weakness.  Differential diagnosis includes lumbar radiculopathy, stroke, trochanteric bursitis patellar dislocation   Lab Tests:  I Ordered, and personally interpreted labs.  The pertinent results include:  CBC normal, CMP with baseline decline in GFR   Imaging Studies ordered:  I ordered imaging studies including x-ray of hip and pelvis I independently visualized and interpreted imaging which showed no acute fracture or dislocation I agree with the radiologist interpretation   Medicines ordered and prescription drug management:  I ordered medication including Toradol for pain Reevaluation of the patient after these medicines showed that the patient improved I have reviewed the patients home medicines and have made adjustments as needed   Problem List / ED Course:  Patient presents to the emergency department with complaints of extremity weakness.  She denies any weakness in the upper extremities, numbness or tingling.  On examination, patient did have tenderness to the left hip and left trochanter on palpitation.  No obvious bruising or erythema in these areas.  Strength symmetric bilaterally in the lower and upper extremities without any acute deficits noted.  Low concern or suspicion for acute stroke.  Given patient having lack of pain radiating into lower extremity do not believe this is consistent with lumbar radicular behavior.  X-ray imaging did not show any abnormal findings such as osteoarthritis, fracture, dislocation.  Given patient's tenderness at the trochanteric bursa site on the left hip,  believe this is most likely consistent with trochanteric bursitis.  Advised patient to use over-the-counter analgesics as well as warm compresses and gentle stretches of the area.  Advised patient to follow-up with primary care provider and orthopedics for further evaluation and potential treatment.  Patient is agreeable to treatment plan verbalized understanding all return precautions.  Final Clinical Impression(s) / ED Diagnoses Final diagnoses:  Trochanteric bursitis of left hip    Rx / DC Orders ED Discharge Orders     None         Smitty Knudsen, PA-C 02/06/23 1728    Tegeler, Canary Brim, MD 02/07/23 1504

## 2023-02-07 DIAGNOSIS — M7062 Trochanteric bursitis, left hip: Secondary | ICD-10-CM | POA: Diagnosis not present

## 2023-02-09 ENCOUNTER — Telehealth: Payer: Self-pay | Admitting: Genetic Counselor

## 2023-02-09 NOTE — Telephone Encounter (Signed)
Patient left a voicemail in regards to her appointments. Called the patient twice, but wasn't able to leave a message due to voicemail box being full.

## 2023-02-12 ENCOUNTER — Telehealth: Payer: Self-pay | Admitting: Physician Assistant

## 2023-02-12 NOTE — Telephone Encounter (Signed)
Patient wanted to cancel her appointment.

## 2023-02-15 ENCOUNTER — Encounter (INDEPENDENT_AMBULATORY_CARE_PROVIDER_SITE_OTHER): Payer: Medicare HMO | Admitting: Ophthalmology

## 2023-02-15 ENCOUNTER — Other Ambulatory Visit: Payer: Self-pay | Admitting: Internal Medicine

## 2023-02-15 DIAGNOSIS — H35033 Hypertensive retinopathy, bilateral: Secondary | ICD-10-CM

## 2023-02-15 DIAGNOSIS — H353132 Nonexudative age-related macular degeneration, bilateral, intermediate dry stage: Secondary | ICD-10-CM

## 2023-02-15 DIAGNOSIS — I10 Essential (primary) hypertension: Secondary | ICD-10-CM | POA: Diagnosis not present

## 2023-02-15 DIAGNOSIS — H43813 Vitreous degeneration, bilateral: Secondary | ICD-10-CM | POA: Diagnosis not present

## 2023-02-16 ENCOUNTER — Encounter (INDEPENDENT_AMBULATORY_CARE_PROVIDER_SITE_OTHER): Payer: Medicare HMO | Admitting: Ophthalmology

## 2023-02-20 ENCOUNTER — Telehealth: Payer: Self-pay | Admitting: Physician Assistant

## 2023-02-20 ENCOUNTER — Telehealth: Payer: Self-pay | Admitting: *Deleted

## 2023-02-20 DIAGNOSIS — K6389 Other specified diseases of intestine: Secondary | ICD-10-CM | POA: Diagnosis not present

## 2023-02-20 DIAGNOSIS — Z0279 Encounter for issue of other medical certificate: Secondary | ICD-10-CM

## 2023-02-20 NOTE — Telephone Encounter (Signed)
Patient with diagnosis of afib on Eliquis for anticoagulation.    Procedure: DIAGNOSTIC LAPAROSCOPY, EXCISION OF MESENTERIC MASS, POSSIBLE SMALL BOWEL RESECTION SURGERY   Date of procedure: TBD  CHA2DS2-VASc Score = 6  This indicates a 9.7% annual risk of stroke. The patient's score is based upon: CHF History: 1 HTN History: 1 Diabetes History: 0 Stroke History: 0 Vascular Disease History: 1 Age Score: 2 Gender Score: 1   CrCl 33mL/min using adj body weight Platelet count 255K  Per office protocol, patient can hold ELiquis for 2-3 days prior to procedure.    **This guidance is not considered finalized until pre-operative APP has relayed final recommendations.**

## 2023-02-20 NOTE — Telephone Encounter (Signed)
Two disability forms and payment received.  Forms in [employer].

## 2023-02-20 NOTE — Telephone Encounter (Signed)
   Pre-operative Risk Assessment    Patient Name: Samantha Short  DOB: 1945-10-01 MRN: [id number]      Request for Surgical Clearance    Procedure:   DIAGNOSTIC LAPAROSCOPY, EXCISION OF MESENTERIC MASS, POSSIBLE SMALL BOWEL RESECTION SURGERY  Date of Surgery:  Clearance TBD                                 Surgeon:  DR. Sophronia Simas Surgeon's Group or Practice Name:  [facility] Phone number:  [phone] Fax number:  [phone] ATTN: [name], CMA   Type of Clearance Requested:   - Medical  - Pharmacy:  Hold Apixaban (Eliquis)     Type of Anesthesia:  General    Additional requests/questions:    [name]   02/20/2023, 1:01 PM

## 2023-02-21 DIAGNOSIS — G4733 Obstructive sleep apnea (adult) (pediatric): Secondary | ICD-10-CM | POA: Diagnosis not present

## 2023-02-23 NOTE — Telephone Encounter (Addendum)
   Primary Cardiologist: Verne Carrow, MD  I called her to ensure no changes in recent medical history.   Chart reviewed as part of pre-operative protocol coverage. Given past medical history and time since last visit, based on ACC/AHA guidelines, Samantha Short would be at acceptable risk for the planned procedure without further cardiovascular testing.   Patient was advised that if she develops new symptoms prior to surgery to contact our office to arrange a follow-up appointment.  He verbalized understanding.  Per office protocol, patient can hold ELiquis for 2-3 days prior to procedure.   I will route this recommendation to the requesting party via Epic fax function and remove from pre-op pool.  Please call with questions.  Levi Aland, NP-C  02/23/2023, 12:37 PM [address] Office [phone] Fax [phone]

## 2023-02-23 NOTE — Telephone Encounter (Signed)
Disability forms are in Renee's basket.

## 2023-02-23 NOTE — Telephone Encounter (Signed)
I spoke with the patient today for preop clearance. She asked me to check on her disability forms. If these are not in Lund' box, will you check with Edson Snowball?  Thank you! Marcelino Duster

## 2023-02-26 DIAGNOSIS — E538 Deficiency of other specified B group vitamins: Secondary | ICD-10-CM | POA: Diagnosis not present

## 2023-02-26 DIAGNOSIS — E039 Hypothyroidism, unspecified: Secondary | ICD-10-CM | POA: Diagnosis not present

## 2023-02-27 ENCOUNTER — Other Ambulatory Visit: Payer: Self-pay | Admitting: *Deleted

## 2023-02-27 ENCOUNTER — Other Ambulatory Visit: Payer: Self-pay | Admitting: Internal Medicine

## 2023-02-27 MED ORDER — AMIODARONE HCL 200 MG PO TABS: 200.0000 mg | ORAL_TABLET | Freq: Every day | ORAL | 3 refills | Status: DC

## 2023-02-27 NOTE — Telephone Encounter (Signed)
-----   Message from [place], [place] sent at 02/27/2023 10:33 AM EDT ----- Regarding: amiodorne Good morning,   The patient said that her Amiodarone refill now says 1 tab 2 times per day, she was checking on the prescription because she was taking 1 tab daily.  The patient said that she doesn't recall being told  to increase the medication at her last visit.  I told the patient that I don't see that it was increased at her last visit but that I would send a message to the nurse to clarify how the patient is supposed to take the medication.  The patient said she will wait on a call before she takes the medication.

## 2023-02-27 NOTE — Telephone Encounter (Signed)
Spoke with patient and verified  she is currently taking Amiodarone 200 mg once a day. New Rx was sent into pharmacy. Patient thanked for call an clarification.

## 2023-02-28 ENCOUNTER — Other Ambulatory Visit: Payer: Self-pay

## 2023-02-28 NOTE — Progress Notes (Signed)
The proposed treatment discussed in conference is for discussion purpose only and is not a binding recommendation.  The patients have not been physically examined, or presented with their treatment options.  Therefore, final treatment plans cannot be decided.  

## 2023-03-01 ENCOUNTER — Telehealth: Payer: Self-pay | Admitting: Cardiovascular Disease

## 2023-03-01 ENCOUNTER — Other Ambulatory Visit: Payer: Medicare HMO

## 2023-03-01 ENCOUNTER — Ambulatory Visit: Payer: Medicare HMO | Admitting: Cardiology

## 2023-03-01 ENCOUNTER — Encounter: Payer: Medicare HMO | Admitting: Genetic Counselor

## 2023-03-01 NOTE — Telephone Encounter (Signed)
Pt calling to f/u on paperwork that has been faxed. Please advise.

## 2023-03-01 NOTE — Telephone Encounter (Signed)
Spoke with the pt, she is requesting an update on her disability forms. Will forward to APP.

## 2023-03-02 ENCOUNTER — Ambulatory Visit: Payer: Self-pay | Admitting: Surgery

## 2023-03-02 NOTE — Telephone Encounter (Signed)
Mailbox full- Unable to leave a message

## 2023-03-05 NOTE — Telephone Encounter (Signed)
Completed forms signed by Dr. Clifton James.  Placed back at front desk for final process.

## 2023-03-05 NOTE — Telephone Encounter (Signed)
Completed forms scanned to patient's documents and to requesting facility. Patient and billing notified.

## 2023-03-08 DIAGNOSIS — E669 Obesity, unspecified: Secondary | ICD-10-CM | POA: Diagnosis not present

## 2023-03-08 DIAGNOSIS — G4733 Obstructive sleep apnea (adult) (pediatric): Secondary | ICD-10-CM | POA: Diagnosis not present

## 2023-03-08 DIAGNOSIS — I10 Essential (primary) hypertension: Secondary | ICD-10-CM | POA: Diagnosis not present

## 2023-03-12 ENCOUNTER — Encounter: Payer: Self-pay | Admitting: Internal Medicine

## 2023-03-12 NOTE — Pre-Procedure Instructions (Signed)
Surgical Instructions    Your procedure is scheduled on Monday 03/19/23.   Report to [facility] Main Entrance "A" at 05:30 A.M., then check in with the Admitting office.  Call this number if you have problems the morning of surgery:  [phone]   If you have any questions prior to your surgery date call [phone]: Open Monday-Friday 8am-4pm If you experience any cold or flu symptoms such as cough, fever, chills, shortness of breath, etc. between now and your scheduled surgery, please notify us at the above number     Remember:  Do not eat after midnight the night before your surgery  You may drink clear liquids until 04:30 A.M. the morning of your surgery.   Clear liquids allowed are: Water, Non-Citrus Juices (without pulp), Carbonated Beverages, Clear Tea, Black Coffee ONLY (NO MILK, CREAM OR POWDERED CREAMER of any kind), and Gatorade  Patient Instructions  The night before surgery:  No food after midnight. ONLY clear liquids after midnight  The day of surgery (if you do NOT have diabetes):  Drink ONE (1) Pre-Surgery Clear Ensure by 04:30 A.M. the morning of surgery. Drink in one sitting. Do not sip.  This drink was given to you during your hospital  pre-op appointment visit.  Nothing else to drink after completing the  Pre-Surgery Clear Ensure.         If you have questions, please contact your surgeon's office.     Take these medicines the morning of surgery with A SIP OF WATER:   amiodarone (PACERONE)   diltiazem (CARDIZEM CD)   levothyroxine (SYNTHROID)   loratadine (CLARITIN)   metoprolol tartrate (LOPRESSOR)     Take these medicines if needed:   acetaminophen (TYLENOL)   famotidine (PEPCID)   nitroGLYCERIN (NITROSTAT)   Polyethylene Glycol 400 (BLINK TEARS OP)    Please follow your surgeon's instructions regarding apixaban (ELIQUIS). If you have not received instructions then please contact your surgeon's office for instructions.   As of today, STOP  taking any Aspirin (unless otherwise instructed by your surgeon) Aleve, Naproxen, Ibuprofen, Motrin, Advil, Goody's, BC's, all herbal medications, fish oil, and all vitamins.           Do not wear jewelry or makeup. Do not wear lotions, powders, perfumes/cologne or deodorant. Do not shave 48 hours prior to surgery.  Men may shave face and neck. Do not bring valuables to the hospital. Do not wear nail polish, gel polish, artificial nails, or any other type of covering on natural nails (fingers and toes) If you have artificial nails or gel coating that need to be removed by a nail salon, please have this removed prior to surgery. Artificial nails or gel coating may interfere with anesthesia's ability to adequately monitor your vital signs.  [facility] is not responsible for any belongings or valuables.    Do NOT Smoke (Tobacco/Vaping)  24 hours prior to your procedure  If you use a CPAP at night, you may bring your mask for your overnight stay.   Contacts, glasses, hearing aids, dentures or partials may not be worn into surgery, please bring cases for these belongings   For patients admitted to the hospital, discharge time will be determined by your treatment team.   Patients discharged the day of surgery will not be allowed to drive home, and someone needs to stay with them for 24 hours.   SURGICAL WAITING ROOM VISITATION Patients having surgery or a procedure may have no more than 2 support people in  the waiting area - these visitors may rotate.   Children under the age of 3 must have an adult with them who is not the patient. If the patient needs to stay at the hospital during part of their recovery, the visitor guidelines for inpatient rooms apply. Pre-op nurse will coordinate an appropriate time for 1 support person to accompany patient in pre-op.  This support person may not rotate.   Please refer to [email or web address] for the  visitor guidelines for Inpatients (after your surgery is over and you are in a regular room).    Special instructions:    Oral Hygiene is also important to reduce your risk of infection.  Remember - BRUSH YOUR TEETH THE MORNING OF SURGERY WITH YOUR REGULAR TOOTHPASTE   [place]- Preparing For Surgery  Before surgery, you can play an important role. Because skin is not sterile, your skin needs to be as free of germs as possible. You can reduce the number of germs on your skin by washing with CHG (chlorahexidine gluconate) Soap before surgery.  CHG is an antiseptic cleaner which kills germs and bonds with the skin to continue killing germs even after washing.     Please do not use if you have an allergy to CHG or antibacterial soaps. If your skin becomes reddened/irritated stop using the CHG.  Do not shave (including legs and underarms) for at least 48 hours prior to first CHG shower. It is OK to shave your face.  Please follow these instructions carefully.     Shower the NIGHT BEFORE SURGERY and the MORNING OF SURGERY with CHG Soap.   If you chose to wash your hair, wash your hair first as usual with your normal shampoo. After you shampoo, rinse your hair and body thoroughly to remove the shampoo.  Then [employer] and genitals (private parts) with your normal soap and rinse thoroughly to remove soap.  After that Use CHG Soap as you would any other liquid soap. You can apply CHG directly to the skin and wash gently with a scrungie or a clean washcloth.   Apply the CHG Soap to your body ONLY FROM THE NECK DOWN.  Do not use on open wounds or open sores. Avoid contact with your eyes, ears, mouth and genitals (private parts). Wash Face and genitals (private parts)  with your normal soap.   Wash thoroughly, paying special attention to the area where your surgery will be performed.  Thoroughly rinse your body with warm water from the neck down.  DO NOT shower/wash with your normal soap after  using and rinsing off the CHG Soap.  Pat yourself dry with a CLEAN TOWEL.  Wear CLEAN PAJAMAS to bed the night before surgery  Place CLEAN SHEETS on your bed the night before your surgery  DO NOT SLEEP WITH PETS.   Day of Surgery:  Take a shower with CHG soap. Wear Clean/Comfortable clothing the morning of surgery Do not apply any deodorants/lotions.   Remember to brush your teeth WITH YOUR REGULAR TOOTHPASTE.    If you received a COVID test during your pre-op visit, it is requested that you wear a mask when out in public, stay away from anyone that may not be feeling well, and notify your surgeon if you develop symptoms. If you have been in contact with anyone that has tested positive in the last 10 days, please notify your surgeon.    Please read over the following fact sheets that you were given.

## 2023-03-12 NOTE — Progress Notes (Signed)
Perioperative device orders request sent via [employer] to [facility].

## 2023-03-12 NOTE — Progress Notes (Signed)
PERIOPERATIVE PRESCRIPTION FOR IMPLANTED CARDIAC DEVICE PROGRAMMING  Patient Information: Name:  Samantha Short  DOB:  07-May-1946  MRN:  [id number]    Planned Procedure:  Diagnostic Laparoscopy, Biopsy Versus Small Bowel Resection  Surgeon:  Dr. Sophronia Simas  Date of Procedure:  03/19/23  Cautery will be used.  Position during surgery:  Supine   Please send documentation back to:  [facility] (Fax # [phone])  Device Information:  Clinic EP Physician:  Sherryl Manges, MD   Device Type:  Pacemaker Manufacturer and Phone #:  [employer]: [id number] Pacemaker Dependent?:  No. Date of Last Device Check:  02/02/23 Normal Device Function?:  Yes.    Electrophysiologist's Recommendations:  Have magnet available. Provide continuous ECG monitoring when magnet is used or reprogramming is to be performed.  Procedure may interfere with device function.  Magnet should be placed over device during procedure.  Per Device Clinic Standing Orders, Lenor Coffin, RN  1:09 PM 03/12/2023

## 2023-03-13 ENCOUNTER — Other Ambulatory Visit: Payer: Self-pay

## 2023-03-13 ENCOUNTER — Encounter (HOSPITAL_COMMUNITY): Payer: Self-pay

## 2023-03-13 ENCOUNTER — Encounter (HOSPITAL_COMMUNITY)
Admission: RE | Admit: 2023-03-13 | Discharge: 2023-03-13 | Disposition: A | Discharge status: Home / Self Care | Payer: Medicare HMO | Source: Ambulatory Visit | Attending: Surgery | Admitting: Surgery

## 2023-03-13 VITALS — BP 159/49 | HR 65 | Temp 97.7°F | Resp 17 | Ht 62.0 in | Wt 205.5 lb

## 2023-03-13 DIAGNOSIS — I4430 Unspecified atrioventricular block: Secondary | ICD-10-CM | POA: Insufficient documentation

## 2023-03-13 DIAGNOSIS — I2510 Atherosclerotic heart disease of native coronary artery without angina pectoris: Secondary | ICD-10-CM | POA: Diagnosis not present

## 2023-03-13 DIAGNOSIS — I160 Hypertensive urgency: Secondary | ICD-10-CM

## 2023-03-13 DIAGNOSIS — I5032 Chronic diastolic (congestive) heart failure: Secondary | ICD-10-CM | POA: Insufficient documentation

## 2023-03-13 DIAGNOSIS — E785 Hyperlipidemia, unspecified: Secondary | ICD-10-CM | POA: Diagnosis not present

## 2023-03-13 DIAGNOSIS — I480 Paroxysmal atrial fibrillation: Secondary | ICD-10-CM | POA: Diagnosis not present

## 2023-03-13 DIAGNOSIS — Z01812 Encounter for preprocedural laboratory examination: Secondary | ICD-10-CM | POA: Insufficient documentation

## 2023-03-13 DIAGNOSIS — D649 Anemia, unspecified: Secondary | ICD-10-CM | POA: Insufficient documentation

## 2023-03-13 DIAGNOSIS — Z951 Presence of aortocoronary bypass graft: Secondary | ICD-10-CM | POA: Diagnosis not present

## 2023-03-13 DIAGNOSIS — I110 Hypertensive heart disease with heart failure: Secondary | ICD-10-CM | POA: Diagnosis not present

## 2023-03-13 DIAGNOSIS — I10 Essential (primary) hypertension: Secondary | ICD-10-CM

## 2023-03-13 HISTORY — DX: Prediabetes: R73.03

## 2023-03-13 LAB — BASIC METABOLIC PANEL
Anion gap: 8 (ref 5–15)
BUN: 24 mg/dL — ABNORMAL HIGH (ref 8–23)
CO2: 27 mmol/L (ref 22–32)
Calcium: 9.3 mg/dL (ref 8.9–10.3)
Chloride: 105 mmol/L (ref 98–111)
Creatinine, Ser: 1.47 mg/dL — ABNORMAL HIGH (ref 0.44–1.00)
GFR, Estimated: 37 mL/min — ABNORMAL LOW (ref >60)
Glucose, Bld: 97 mg/dL (ref 70–99)
Potassium: 4.7 mmol/L (ref 3.5–5.1)
Sodium: 140 mmol/L (ref 135–145)

## 2023-03-13 LAB — CBC
HCT: 41.6 % (ref 36.0–46.0)
Hemoglobin: 13.3 g/dL (ref 12.0–15.0)
MCH: 30.1 pg (ref 26.0–34.0)
MCHC: 32 g/dL (ref 30.0–36.0)
MCV: 94.1 fL (ref 80.0–100.0)
Platelets: 235 10*3/uL (ref 150–400)
RBC: 4.42 MIL/uL (ref 3.87–5.11)
RDW: 15.3 % (ref 11.5–15.5)
WBC: 8.5 10*3/uL (ref 4.0–10.5)
nRBC: 0 % (ref 0.0–0.2)

## 2023-03-13 NOTE — Progress Notes (Addendum)
PCP - Mila Palmer, MD Cardiologist - Sherryl Manges, MD and Verne Carrow, MD  PPM/ICD - Pacemaker Device Orders - Faxed and received.  Rep Notified - Yes. Notified on 03/13/2023  Chest x-ray - 01/02/2023 EKG - 02/02/2023 Stress Test - Denies ECHO - 01/04/2023 Cardiac Cath - 03/17/2020  Sleep Study - 10/17/2022 BPAP: patient states she wears BPAP majority of the nights  DM: Pre-diabetic  Blood Thinner Instructions: Per cardiologist: hold Eliquis 2-3 days prior to procedure. Last dose on 03/16/2023 Aspirin Instructions: N/A  ERAS Protcol - Yes PRE-SURGERY Ensure or G2- Ensure  COVID TEST- No   Anesthesia review: yes, cardiac history  Patient denies shortness of breath, fever, cough and chest pain at PAT appointment   All instructions explained to the patient, with a verbal understanding of the material. Patient agrees to go over the instructions while at home for a better understanding.The opportunity to ask questions was provided.

## 2023-03-14 NOTE — Progress Notes (Signed)
Anesthesia Chart Review:  Follows with cardiology for history of  CAD s/p CABG x 1 in 2010, HTN, HLD, paroxysmal A-fib on Eliquis, diastolic CHF, CHB s/p [employer]tr[employer] PPM, mild to moderate aortic stenosis (mean gradient 17 mmHg by echo 12/2022), OSA on CPAP.  Cardiac clearance per telephone encounter 02/23/2023 by Eligha Bridegroom, NP, "Chart reviewed as part of pre-operative protocol coverage. Given past medical history and time since last visit, based on ACC/AHA guidelines, Samantha Short would be at acceptable risk for the planned procedure without further cardiovascular testing. Patient was advised that if she develops new symptoms prior to surgery to contact our office to arrange a follow-up appointment.  He verbalized understanding. Per office protocol, patient can hold ELiquis for 2-3 days prior to procedure."  History of hyperthyroidism s/p ablation.  Preop labs reviewed, creatinine elevated 1.47 consistent with history of CKD 3, otherwise unremarkable.  EKG 02/02/2023:NSR.  Rate 64.  Right bundle branch block.  CT chest abdomen pelvis 01/02/2023: IMPRESSION: 1. No evidence of thoracoabdominal aortic aneurysm or dissection. 2. Trace right pleural effusion. 3. Indeterminate partially calcified central mesenteric mass. There is a wide differential diagnosis, which includes carcinoid tumor, calcified fibrous tumor of the mesentery, treated lymphoma, or sequela of previous infection or trauma. There is no previous abdominal imaging available for comparison. Please correlate with any known oncologic history. 4. Distal colonic diverticulosis without diverticulitis. 5.  Aortic Atherosclerosis (ICD10-I70.0).  TTE 01/04/2023:  1. Left ventricular ejection fraction, by estimation, is 60 to 65%. The  left ventricle has normal function. The left ventricle has no regional  wall motion abnormalities. Left ventricular diastolic function could not  be evaluated.   2. Right ventricular systolic function  is normal. The right ventricular  size is normal. There is normal pulmonary artery systolic pressure. The  estimated right ventricular systolic pressure is 28.2 mmHg.   3. The mitral valve is normal in structure. Mild mitral valve  regurgitation. No evidence of mitral stenosis. Moderate mitral annular  calcification.   4. The aortic valve is normal in structure. There is severe calcifcation  of the aortic valve. There is severe thickening of the aortic valve.  Aortic valve regurgitation is trivial. Mild to moderate aortic valve  stenosis. Aortic valve area, by VTI  measures 1.43 cm. Aortic valve mean gradient measures 17.0 mmHg. Aortic  valve Vmax measures 2.52 m/s. DVI 0.41   5. The inferior vena cava is dilated in size with >50% respiratory  variability, suggesting right atrial pressure of 8 mmHg.   Right/Left heart cath 03/17/2020:  LIMA and is small. The graft exhibits no disease. There is competitive flow. Prox LAD to Mid LAD lesion is 20% stenosed.   1.  Mild nonobstructive coronary artery disease.  LIMA to LAD is patent.  However, the native LAD has normal antegrade flow and thus there is competitive flow for the LIMA flow. 2.  Left ventricular angiography was not performed due to chronic kidney disease. 3.  Right heart catheterization showed moderately elevated filling pressures, moderate pulmonary hypertension and normal cardiac output.  Prominent V waves on pulmonary capillary wedge pressure (39 mmHg) suggestive of significant mitral regurgitation. 4.  Minimal aortic valve gradient with peak to peak systolic gradient less than 10 mmHg.   Recommendations: No ischemic etiology for high-grade AV block.  Proceed with pacemaker placement as planned. Recommend evaluation of mitral valve with an echocardiogram. Consider gentle diuresis given elevated filling pressures.    Samantha Poles, PA-C [facility]/Anesthesiology Phone [phone])  [phone] 03/14/2023 11:47 AM

## 2023-03-14 NOTE — Anesthesia Preprocedure Evaluation (Addendum)
Anesthesia Evaluation  Patient identified by MRN, date of birth, ID band Patient awake    Reviewed: Allergy & Precautions, NPO status , Patient's Chart, lab work & pertinent test results  History of Anesthesia Complications (+) PONV and history of anesthetic complications  Airway Mallampati: II  TM Distance: >3 FB Neck ROM: Full    Dental no notable dental hx. (+) Teeth Intact, Dental Advisory Given   Pulmonary asthma , sleep apnea and Continuous Positive Airway Pressure Ventilation , former smoker   Pulmonary exam normal breath sounds clear to auscultation       Cardiovascular hypertension, + CAD, + CABG (S/P) and +CHF  Normal cardiovascular exam+ dysrhythmias Atrial Fibrillation + pacemaker + Valvular Problems/Murmurs (mod) AS  Rhythm:Regular Rate:Normal  12/2022 TTE 1. Left ventricular ejection fraction, by estimation, is 60 to 65%. The  left ventricle has normal function. The left ventricle has no regional  wall motion abnormalities. Left ventricular diastolic function could not  be evaluated.   2. Right ventricular systolic function is normal. The right ventricular  size is normal. There is normal pulmonary artery systolic pressure. The  estimated right ventricular systolic pressure is 28.2 mmHg.   3. The mitral valve is normal in structure. Mild mitral valve  regurgitation. No evidence of mitral stenosis. Moderate mitral annular  calcification.   4. The aortic valve is normal in structure. There is severe calcifcation  of the aortic valve. There is severe thickening of the aortic valve.  Aortic valve regurgitation is trivial. Mild to moderate aortic valve  stenosis. Aortic valve area, by VTI  measures 1.43 cm. Aortic valve mean gradient measures 17.0 mmHg. Aortic  valve Vmax measures 2.52 m/s. DVI 0.41   5. The inferior vena cava is dilated in size with >50% respiratory  variability, suggesting right atrial pressure of 8  mmHg.      Neuro/Psych  PSYCHIATRIC DISORDERS Anxiety Depression       GI/Hepatic hiatal hernia,GERD  ,,  Endo/Other  Hypothyroidism    Renal/GU Lab Results      Component                Value               Date                      CREATININE               1.47 (H)            03/13/2023                 K                        4.7                 03/13/2023                   Musculoskeletal  (+) Arthritis ,    Abdominal   Peds  Hematology Lab Results      Component                Value               Date                      WBC  8.5                 03/13/2023                HGB                      13.3                03/13/2023                HCT                      41.6                03/13/2023                  PLT                      235                 03/13/2023              Anesthesia Other Findings ALL: Latex see list  Reproductive/Obstetrics                             Anesthesia Physical Anesthesia Plan  ASA: 3  Anesthesia Plan: General   Post-op Pain Management: Tylenol PO (pre-op)*, Precedex and Lidocaine infusion*   Induction: Intravenous  PONV Risk Score and Plan: 4 or greater and Treatment may vary due to age or medical condition, Ondansetron, Midazolam and Aprepitant  Airway Management Planned: Oral ETT  Additional Equipment: None  Intra-op Plan:   Post-operative Plan:   Informed Consent: I have reviewed the patients History and Physical, chart, labs and discussed the procedure including the risks, benefits and alternatives for the proposed anesthesia with the patient or authorized representative who has indicated his/her understanding and acceptance.     Dental advisory given  Plan Discussed with:   Anesthesia Plan Comments: (PAT note by [name], PA-C:  Follows with cardiology for history of  CAD s/p CABG x 1 in 2010, HTN, HLD, paroxysmal A-fib on Eliquis, diastolic CHF, CHB s/p  Medtronic PPM, mild to moderate aortic stenosis (mean gradient 17 mmHg by echo 12/2022), OSA on CPAP.  Cardiac clearance per telephone encounter 02/23/2023 by Eligha Bridegroom, NP, "Chart reviewed as part of pre-operative protocol coverage. Given past medical history and time since last visit, based on ACC/AHA guidelines, Samantha Short would be at acceptable risk for the planned procedure without further cardiovascular testing. Patient was advised that if she develops new symptoms prior to surgery to contact our office to arrange a follow-up appointment.  He verbalized understanding. Per office protocol, patient can hold ELiquis for 2-3 days prior to procedure."  History of hyperthyroidism s/p ablation.  Preop labs reviewed, creatinine elevated 1.47 consistent with history of CKD 3, otherwise unremarkable.  EKG 02/02/2023:NSR.  Rate 64.  Right bundle branch block.  CT chest abdomen pelvis 01/02/2023: IMPRESSION: 1. No evidence of thoracoabdominal aortic aneurysm or dissection. 2. Trace right pleural effusion. 3. Indeterminate partially calcified central mesenteric mass. There is a wide differential diagnosis, which includes carcinoid tumor, calcified fibrous tumor of the mesentery, treated lymphoma, or sequela of previous infection or trauma. There is no previous abdominal imaging available for comparison. Please correlate with any known oncologic history. 4. Distal colonic diverticulosis without diverticulitis. 5.  Aortic Atherosclerosis (ICD10-I70.0).  TTE 01/04/2023: 1. Left ventricular ejection fraction, by estimation, is 60 to 65%. The  left ventricle has normal function. The left ventricle has no regional  wall motion abnormalities. Left ventricular diastolic function could not  be evaluated.  2. Right ventricular systolic function is normal. The right ventricular  size is normal. There is normal pulmonary artery systolic pressure. The  estimated right ventricular systolic pressure  is 28.2 mmHg.  3. The mitral valve is normal in structure. Mild mitral valve  regurgitation. No evidence of mitral stenosis. Moderate mitral annular  calcification.  4. The aortic valve is normal in structure. There is severe calcifcation  of the aortic valve. There is severe thickening of the aortic valve.  Aortic valve regurgitation is trivial. Mild to moderate aortic valve  stenosis. Aortic valve area, by VTI  measures 1.43 cm. Aortic valve mean gradient measures 17.0 mmHg. Aortic  valve Vmax measures 2.52 m/s. DVI 0.41  5. The inferior vena cava is dilated in size with >50% respiratory  variability, suggesting right atrial pressure of 8 mmHg.   Right/Left heart cath 03/17/2020: ?  LIMA and is small. ? The graft exhibits no disease. ? There is competitive flow. ? Prox LAD to Mid LAD lesion is 20% stenosed.   1.  Mild nonobstructive coronary artery disease.  LIMA to LAD is patent.  However, the native LAD has normal antegrade flow and thus there is competitive flow for the LIMA flow. 2.  Left ventricular angiography was not performed due to chronic kidney disease. 3.  Right heart catheterization showed moderately elevated filling pressures, moderate pulmonary hypertension and normal cardiac output.  Prominent V waves on pulmonary capillary wedge pressure (39 mmHg) suggestive of significant mitral regurgitation. 4.  Minimal aortic valve gradient with peak to peak systolic gradient less than 10 mmHg.  Recommendations: No ischemic etiology for high-grade AV block.  Proceed with pacemaker placement as planned. Recommend evaluation of mitral valve with an echocardiogram. Consider gentle diuresis given elevated filling pressures.  )        Anesthesia Quick Evaluation

## 2023-03-16 ENCOUNTER — Ambulatory Visit (INDEPENDENT_AMBULATORY_CARE_PROVIDER_SITE_OTHER): Payer: Medicare HMO

## 2023-03-16 DIAGNOSIS — I441 Atrioventricular block, second degree: Secondary | ICD-10-CM | POA: Diagnosis not present

## 2023-03-16 LAB — CUP PACEART REMOTE DEVICE CHECK
Battery Remaining Longevity: 133 mo
Battery Voltage: 3.02 V
Brady Statistic AP VP Percent: 17.02 %
Brady Statistic AP VS Percent: 41.39 %
Brady Statistic AS VP Percent: 12.43 %
Brady Statistic AS VS Percent: 29.16 %
Brady Statistic RA Percent Paced: 58.39 %
Brady Statistic RV Percent Paced: 29.45 %
Date Time Interrogation Session: 20240704200341
Implantable Lead Connection Status: 753985
Implantable Lead Connection Status: 753985
Implantable Lead Implant Date: 20210707
Implantable Lead Implant Date: 20210707
Implantable Lead Location: 753859
Implantable Lead Location: 753860
Implantable Lead Model: 5076
Implantable Lead Model: 5076
Implantable Lead Serial Number: 8264443
Implantable Pulse Generator Implant Date: 20210707
Lead Channel Impedance Value: 323 Ohm
Lead Channel Impedance Value: 380 Ohm
Lead Channel Impedance Value: 418 Ohm
Lead Channel Impedance Value: 494 Ohm
Lead Channel Pacing Threshold Amplitude: 0.625 V
Lead Channel Pacing Threshold Amplitude: 1 V
Lead Channel Pacing Threshold Pulse Width: 0.4 ms
Lead Channel Pacing Threshold Pulse Width: 0.4 ms
Lead Channel Sensing Intrinsic Amplitude: 2.5 mV
Lead Channel Sensing Intrinsic Amplitude: 2.5 mV
Lead Channel Sensing Intrinsic Amplitude: 6.625 mV
Lead Channel Sensing Intrinsic Amplitude: 6.625 mV
Lead Channel Setting Pacing Amplitude: 1.5 V
Lead Channel Setting Pacing Amplitude: 2.5 V
Lead Channel Setting Pacing Pulse Width: 0.4 ms
Lead Channel Setting Sensing Sensitivity: 2.8 mV
Zone Setting Status: 755011
Zone Setting Status: 755011

## 2023-03-19 ENCOUNTER — Other Ambulatory Visit: Payer: Self-pay

## 2023-03-19 ENCOUNTER — Inpatient Hospital Stay (HOSPITAL_COMMUNITY): Payer: Medicare HMO | Admitting: Anesthesiology

## 2023-03-19 ENCOUNTER — Ambulatory Visit (HOSPITAL_COMMUNITY)
Admission: RE | Admit: 2023-03-19 | Discharge: 2023-03-19 | Disposition: A | Discharge status: Home / Self Care | Payer: Medicare HMO | Attending: Surgery | Admitting: Surgery

## 2023-03-19 ENCOUNTER — Encounter (HOSPITAL_COMMUNITY)
Admission: RE | Disposition: A | Discharge status: Home / Self Care | Payer: Self-pay | Source: Home / Self Care | Attending: Surgery

## 2023-03-19 ENCOUNTER — Encounter (HOSPITAL_COMMUNITY): Payer: Self-pay | Admitting: Surgery

## 2023-03-19 ENCOUNTER — Inpatient Hospital Stay (HOSPITAL_COMMUNITY): Payer: Medicare HMO | Admitting: Physician Assistant

## 2023-03-19 DIAGNOSIS — I350 Nonrheumatic aortic (valve) stenosis: Secondary | ICD-10-CM | POA: Diagnosis not present

## 2023-03-19 DIAGNOSIS — I2510 Atherosclerotic heart disease of native coronary artery without angina pectoris: Secondary | ICD-10-CM | POA: Insufficient documentation

## 2023-03-19 DIAGNOSIS — I5032 Chronic diastolic (congestive) heart failure: Secondary | ICD-10-CM

## 2023-03-19 DIAGNOSIS — Z950 Presence of cardiac pacemaker: Secondary | ICD-10-CM | POA: Insufficient documentation

## 2023-03-19 DIAGNOSIS — I130 Hypertensive heart and chronic kidney disease with heart failure and stage 1 through stage 4 chronic kidney disease, or unspecified chronic kidney disease: Secondary | ICD-10-CM | POA: Diagnosis not present

## 2023-03-19 DIAGNOSIS — Z7901 Long term (current) use of anticoagulants: Secondary | ICD-10-CM | POA: Diagnosis not present

## 2023-03-19 DIAGNOSIS — R1900 Intra-abdominal and pelvic swelling, mass and lump, unspecified site: Secondary | ICD-10-CM | POA: Diagnosis not present

## 2023-03-19 DIAGNOSIS — Z951 Presence of aortocoronary bypass graft: Secondary | ICD-10-CM | POA: Insufficient documentation

## 2023-03-19 DIAGNOSIS — I442 Atrioventricular block, complete: Secondary | ICD-10-CM | POA: Insufficient documentation

## 2023-03-19 DIAGNOSIS — K6389 Other specified diseases of intestine: Secondary | ICD-10-CM | POA: Diagnosis not present

## 2023-03-19 DIAGNOSIS — I110 Hypertensive heart disease with heart failure: Secondary | ICD-10-CM | POA: Diagnosis not present

## 2023-03-19 DIAGNOSIS — I4891 Unspecified atrial fibrillation: Secondary | ICD-10-CM | POA: Diagnosis not present

## 2023-03-19 DIAGNOSIS — Z87891 Personal history of nicotine dependence: Secondary | ICD-10-CM

## 2023-03-19 DIAGNOSIS — E785 Hyperlipidemia, unspecified: Secondary | ICD-10-CM | POA: Diagnosis not present

## 2023-03-19 DIAGNOSIS — K668 Other specified disorders of peritoneum: Secondary | ICD-10-CM | POA: Diagnosis not present

## 2023-03-19 DIAGNOSIS — K654 Sclerosing mesenteritis: Secondary | ICD-10-CM | POA: Diagnosis not present

## 2023-03-19 HISTORY — PX: MASS EXCISION: SHX2000

## 2023-03-19 HISTORY — PX: LAPAROSCOPY: SHX197

## 2023-03-19 SURGERY — LAPAROSCOPY, DIAGNOSTIC
Anesthesia: General | Site: Abdomen

## 2023-03-19 MED ORDER — DEXMEDETOMIDINE HCL IN NACL 80 MCG/20ML IV SOLN
INTRAVENOUS | Status: DC | PRN
  Administered 2023-03-19: 8 ug via INTRAVENOUS

## 2023-03-19 MED ORDER — ROCURONIUM BROMIDE 10 MG/ML (PF) SYRINGE
PREFILLED_SYRINGE | INTRAVENOUS | Status: AC
  Filled 2023-03-19: qty 10

## 2023-03-19 MED ORDER — DEXMEDETOMIDINE HCL IN NACL 80 MCG/20ML IV SOLN
INTRAVENOUS | Status: AC
  Filled 2023-03-19: qty 20

## 2023-03-19 MED ORDER — AMISULPRIDE (ANTIEMETIC) 5 MG/2ML IV SOLN
INTRAVENOUS | Status: AC
  Administered 2023-03-19: 10 mg via INTRAVENOUS
  Filled 2023-03-19: qty 4

## 2023-03-19 MED ORDER — DEXAMETHASONE SODIUM PHOSPHATE 10 MG/ML IJ SOLN
INTRAMUSCULAR | Status: DC | PRN
  Administered 2023-03-19: 5 mg via INTRAVENOUS

## 2023-03-19 MED ORDER — ROCURONIUM BROMIDE 100 MG/10ML IV SOLN
INTRAVENOUS | Status: DC | PRN
  Administered 2023-03-19: 70 mg via INTRAVENOUS

## 2023-03-19 MED ORDER — FENTANYL CITRATE (PF) 250 MCG/5ML IJ SOLN
INTRAMUSCULAR | Status: AC
  Filled 2023-03-19: qty 5

## 2023-03-19 MED ORDER — PROPOFOL 10 MG/ML IV BOLUS
INTRAVENOUS | Status: AC
  Filled 2023-03-19: qty 20

## 2023-03-19 MED ORDER — CHLORHEXIDINE GLUCONATE 0.12 % MT SOLN
15.0000 mL | Freq: Once | OROMUCOSAL | Status: AC
  Administered 2023-03-19: 15 mL via OROMUCOSAL
  Filled 2023-03-19: qty 15

## 2023-03-19 MED ORDER — BUPIVACAINE-EPINEPHRINE (PF) 0.25% -1:200000 IJ SOLN
INTRAMUSCULAR | Status: DC | PRN
  Administered 2023-03-19: 15 mL

## 2023-03-19 MED ORDER — APREPITANT 40 MG PO CAPS
40.0000 mg | ORAL_CAPSULE | Freq: Once | ORAL | Status: AC
  Administered 2023-03-19: 40 mg via ORAL
  Filled 2023-03-19: qty 1

## 2023-03-19 MED ORDER — GLYCOPYRROLATE PF 0.2 MG/ML IJ SOSY
PREFILLED_SYRINGE | INTRAMUSCULAR | Status: AC
  Filled 2023-03-19: qty 1

## 2023-03-19 MED ORDER — PROPOFOL 10 MG/ML IV BOLUS
INTRAVENOUS | Status: DC | PRN
  Administered 2023-03-19: 120 mg via INTRAVENOUS

## 2023-03-19 MED ORDER — SUGAMMADEX SODIUM 200 MG/2ML IV SOLN
INTRAVENOUS | Status: DC | PRN
  Administered 2023-03-19: 150 mg via INTRAVENOUS
  Administered 2023-03-19: 100 mg via INTRAVENOUS
  Administered 2023-03-19: 50 mg via INTRAVENOUS

## 2023-03-19 MED ORDER — BUPIVACAINE-EPINEPHRINE (PF) 0.25% -1:200000 IJ SOLN
INTRAMUSCULAR | Status: AC
  Filled 2023-03-19: qty 30

## 2023-03-19 MED ORDER — ONDANSETRON HCL 4 MG/2ML IJ SOLN
INTRAMUSCULAR | Status: DC | PRN
  Administered 2023-03-19 (×2): 4 mg via INTRAVENOUS

## 2023-03-19 MED ORDER — KETOROLAC TROMETHAMINE 15 MG/ML IJ SOLN: 15.0000 mg | Freq: Once | INTRAMUSCULAR | Status: DC | PRN

## 2023-03-19 MED ORDER — ORAL CARE MOUTH RINSE: 15.0000 mL | Freq: Once | OROMUCOSAL | Status: AC

## 2023-03-19 MED ORDER — LIDOCAINE 2% (20 MG/ML) 5 ML SYRINGE
INTRAMUSCULAR | Status: AC
  Filled 2023-03-19: qty 5

## 2023-03-19 MED ORDER — LIDOCAINE 2% (20 MG/ML) 5 ML SYRINGE
INTRAMUSCULAR | Status: DC | PRN
  Administered 2023-03-19: 100 mg via INTRAVENOUS

## 2023-03-19 MED ORDER — FENTANYL CITRATE (PF) 100 MCG/2ML IJ SOLN
INTRAMUSCULAR | Status: DC | PRN
  Administered 2023-03-19 (×2): 50 ug via INTRAVENOUS

## 2023-03-19 MED ORDER — HYDROCODONE-ACETAMINOPHEN 5-325 MG PO TABS
1.0000 | ORAL_TABLET | Freq: Four times a day (QID) | ORAL | 0 refills | Status: AC | PRN
Start: 2023-03-19 — End: 2023-03-22

## 2023-03-19 MED ORDER — FENTANYL CITRATE (PF) 100 MCG/2ML IJ SOLN: 25.0000 ug | INTRAMUSCULAR | Status: DC | PRN

## 2023-03-19 MED ORDER — CHLORHEXIDINE GLUCONATE CLOTH 2 % EX PADS: 6.0000 | MEDICATED_PAD | Freq: Once | CUTANEOUS | Status: DC

## 2023-03-19 MED ORDER — AMISULPRIDE (ANTIEMETIC) 5 MG/2ML IV SOLN: 10.0000 mg | Freq: Once | INTRAVENOUS | Status: AC

## 2023-03-19 MED ORDER — GLYCOPYRROLATE 0.2 MG/ML IJ SOLN
INTRAMUSCULAR | Status: DC | PRN
  Administered 2023-03-19: .1 mg via INTRAVENOUS

## 2023-03-19 MED ORDER — ONDANSETRON HCL 4 MG/2ML IJ SOLN: 4.0000 mg | Freq: Once | INTRAMUSCULAR | Status: DC | PRN

## 2023-03-19 MED ORDER — 0.9 % SODIUM CHLORIDE (POUR BTL) OPTIME
TOPICAL | Status: DC | PRN
  Administered 2023-03-19: 1000 mL

## 2023-03-19 MED ORDER — SODIUM CHLORIDE 0.9 % IR SOLN
Status: DC | PRN
  Administered 2023-03-19: 500 mL

## 2023-03-19 MED ORDER — LACTATED RINGERS IV SOLN: INTRAVENOUS | Status: DC | PRN

## 2023-03-19 MED ORDER — ENSURE PRE-SURGERY PO LIQD: 296.0000 mL | Freq: Once | ORAL | Status: DC

## 2023-03-19 MED ORDER — ACETAMINOPHEN 500 MG PO TABS
1000.0000 mg | ORAL_TABLET | ORAL | Status: AC
  Administered 2023-03-19: 1000 mg via ORAL
  Filled 2023-03-19: qty 2

## 2023-03-19 MED ORDER — LACTATED RINGERS IV SOLN: INTRAVENOUS | Status: DC

## 2023-03-19 MED ORDER — ONDANSETRON HCL 4 MG/2ML IJ SOLN
INTRAMUSCULAR | Status: AC
  Filled 2023-03-19: qty 2

## 2023-03-19 MED ORDER — VANCOMYCIN HCL IN DEXTROSE 1-5 GM/200ML-% IV SOLN
1000.0000 mg | INTRAVENOUS | Status: AC
  Administered 2023-03-19: 1000 mg via INTRAVENOUS
  Filled 2023-03-19: qty 200

## 2023-03-19 SURGICAL SUPPLY — 50 items
ADH SKN CLS APL DERMABOND .7 (GAUZE/BANDAGES/DRESSINGS) ×2
APL PRP STRL LF DISP 70% ISPRP (MISCELLANEOUS) ×2
BAG COUNTER SPONGE SURGICOUNT (BAG) ×2 IMPLANT
BAG SPNG CNTER NS LX DISP (BAG) ×2
CANISTER SUCT 3000ML PPV (MISCELLANEOUS) ×2 IMPLANT
CHLORAPREP W/TINT 26 (MISCELLANEOUS) ×2 IMPLANT
COVER SURGICAL LIGHT HANDLE (MISCELLANEOUS) ×2 IMPLANT
DERMABOND ADVANCED .7 DNX12 (GAUZE/BANDAGES/DRESSINGS) ×4 IMPLANT
DRAPE INCISE IOBAN 66X45 STRL (DRAPES) ×2 IMPLANT
DRAPE WARM FLUID 44X44 (DRAPES) ×2 IMPLANT
DRSG TELFA 3X8 NADH STRL (GAUZE/BANDAGES/DRESSINGS) IMPLANT
ELECT CAUTERY BLADE 6.4 (BLADE) ×2 IMPLANT
ELECT REM PT RETURN 9FT ADLT (ELECTROSURGICAL) ×2
ELECTRODE REM PT RTRN 9FT ADLT (ELECTROSURGICAL) ×2 IMPLANT
GAUZE SPONGE 4X4 12PLY STRL (GAUZE/BANDAGES/DRESSINGS) ×2 IMPLANT
GLOVE BIOGEL PI IND STRL 6 (GLOVE) ×2 IMPLANT
GLOVE BIOGEL PI MICRO STRL 5.5 (GLOVE) ×4 IMPLANT
GLOVE SURG POLY MICRO LF SZ5.5 (GLOVE) ×2 IMPLANT
GOWN STRL REUS W/ TWL LRG LVL3 (GOWN DISPOSABLE) ×6 IMPLANT
GOWN STRL REUS W/TWL LRG LVL3 (GOWN DISPOSABLE) ×6
IRRIG SUCT STRYKERFLOW 2 WTIP (MISCELLANEOUS) ×2
IRRIGATION SUCT STRKRFLW 2 WTP (MISCELLANEOUS) IMPLANT
KIT BASIN OR (CUSTOM PROCEDURE TRAY) ×2 IMPLANT
KIT TURNOVER KIT B (KITS) ×2 IMPLANT
L-HOOK LAP DISP 36CM (ELECTROSURGICAL) ×2
LHOOK LAP DISP 36CM (ELECTROSURGICAL) IMPLANT
NDL INSUFFLATION 14GA 120MM (NEEDLE) ×2 IMPLANT
NEEDLE INSUFFLATION 14GA 120MM (NEEDLE) ×2 IMPLANT
NS IRRIG 1000ML POUR BTL (IV SOLUTION) ×4 IMPLANT
PAD ARMBOARD 7.5X6 YLW CONV (MISCELLANEOUS) ×4 IMPLANT
PENCIL SMOKE EVACUATOR (MISCELLANEOUS) ×2 IMPLANT
SET TUBE SMOKE EVAC HIGH FLOW (TUBING) ×2 IMPLANT
SLEEVE Z-THREAD 5X100MM (TROCAR) ×2 IMPLANT
SPECIMEN JAR LARGE (MISCELLANEOUS) IMPLANT
SPECIMEN JAR SMALL (MISCELLANEOUS) ×2 IMPLANT
SUT MON AB 4-0 PC3 18 (SUTURE) ×2 IMPLANT
SUT PDS AB 1 TP1 96 (SUTURE) ×4 IMPLANT
SUT SILK 2 0 SH CR/8 (SUTURE) ×2 IMPLANT
SUT SILK 2 0 TIES 10X30 (SUTURE) ×2 IMPLANT
SUT SILK 3 0 SH CR/8 (SUTURE) ×2 IMPLANT
SUT SILK 3 0 TIES 10X30 (SUTURE) ×2 IMPLANT
SUT VIC AB 3-0 SH 27 (SUTURE) ×4
SUT VIC AB 3-0 SH 27XBRD (SUTURE) ×4 IMPLANT
SYR CONTROL 10ML LL (SYRINGE) ×2 IMPLANT
TOWEL GREEN STERILE (TOWEL DISPOSABLE) ×2 IMPLANT
TOWEL GREEN STERILE FF (TOWEL DISPOSABLE) ×2 IMPLANT
TRAY LAPAROSCOPIC MC (CUSTOM PROCEDURE TRAY) ×2 IMPLANT
TROCAR Z-THREAD OPTICAL 5X100M (TROCAR) ×2 IMPLANT
WARMER LAPAROSCOPE (MISCELLANEOUS) ×2 IMPLANT
YANKAUER SUCT BULB TIP NO VENT (SUCTIONS) ×2 IMPLANT

## 2023-03-19 NOTE — Anesthesia Procedure Notes (Signed)
Procedure Name: Intubation Date/Time: 03/19/2023 7:34 AM  Performed by: Marny Lowenstein, CRNAPre-anesthesia Checklist: Patient identified, Emergency Drugs available, Suction available and Patient being monitored Patient Re-evaluated:Patient Re-evaluated prior to induction Oxygen Delivery Method: Circle system utilized Preoxygenation: Pre-oxygenation with 100% oxygen Induction Type: IV induction Ventilation: Mask ventilation without difficulty Laryngoscope Size: Miller and 2 Grade View: Grade I Tube type: Oral Tube size: 7.0 mm Number of attempts: 1 Airway Equipment and Method: Stylet Placement Confirmation: ETT inserted through vocal cords under direct vision, positive ETCO2 and breath sounds checked- equal and bilateral Secured at: 21 cm Tube secured with: Tape Dental Injury: Teeth and Oropharynx as per pre-operative assessment

## 2023-03-19 NOTE — Transfer of Care (Signed)
Immediate Anesthesia Transfer of Care Note  Patient: Samantha Short  Procedure(s) Performed: LAPAROSCOPY DIAGNOSTIC BIOPSY OF MESENTERIC MASS (Abdomen)  Patient Location: PACU  Anesthesia Type:General  Level of Consciousness: awake, alert , and oriented  Airway & Oxygen Therapy: Patient Spontanous Breathing and Patient connected to nasal cannula oxygen  Post-op Assessment: Report given to RN and Post -op Vital signs reviewed and stable  Post vital signs: Reviewed and stable  Last Vitals:  Vitals Value Taken Time  BP 124/57 03/19/23 0830  Temp    Pulse 60 03/19/23 0833  Resp 15 03/19/23 0833  SpO2 99 % 03/19/23 0833  Vitals shown include unvalidated device data.  Last Pain:  Vitals:   03/19/23 0612  TempSrc:   PainSc: 0-No pain         Complications: No notable events documented.

## 2023-03-19 NOTE — H&P (Signed)
Samantha Short is an 77 y.o. female.   Chief Complaint: abdominal mass HPI: Samantha Short is a 77 y.o. female who was referred to me for evaluation of a mesenteric mass. She was admitted in April with chest pain, hypertensive urgency and a-fib, and was incidentally found to have a calcified mesenteric mass. She was seen in heme/onc diagnostic clinic on 5/1 and tumor markers were sent. She was noted to have an elevated chromogranin of 240. Biopsy of the mass was discussed with IR but it was not felt be amenable to percutaneous biopsy due to proximity to mesenteric vessels. She had a dotatate scan on 5/16, which showed mild tracer uptake in the mesenteric mass, consistent with a neuroendocrine tumor. No other sites of uptake were noted. She has not had any abdominal pain, changes in appetite, and unplanned weight loss.  She has multiple cardiac comorbidities, including CAD (s/p CABG in 2010, most recent LHC in 2021 showed mild nonobstructive CAD), complete heart block (s/p pacemaker placement 2021), and a-fib (she is on Eliquis). Her most recent echo in April showed an LVEF of 65% and mild to moderate aortic stenosis. She received preop cardiology clearance and stopped taking her Eliquis 3 days ago. She is here today for surgery.   Past Medical History:  Diagnosis Date   Anemia    pernicious   Anxiety    Arthritis    Asthma    Chronic diastolic CHF (congestive heart failure) (HCC)    a. 02/2011 Echo: EF 55-60%, Gr2 DD, Mild MR   Chronic kidney disease    stage 3   Complication of anesthesia    Coronary artery disease    a. s/p CABG x 1 2010:  LIMA->LAD.;  b. amdx for CP => LHC 07/04/12: LAD 70-80%, mid RCA 30%, LIMA-LAD patent with competitive flow limiting distal LAD filling, EF 70% with hyperdynamic LV function. Medical therapy continued.   Depression    Dyslipidemia    Dysrhythmia    hx of Afib   GERD (gastroesophageal reflux disease)    Headache    hx of migraines   Heart murmur     History of hiatal hernia    History of kidney stones    Hyperlipidemia    Hypertension    Hyperthyroidism    Hypothyroidism    Mild aortic stenosis    Mitral regurgitation    a. mild by echo 02/2011.   Pneumonia    PONV (postoperative nausea and vomiting)    Pre-diabetes    Presence of permanent cardiac pacemaker    Sleep apnea    diagnosed 08-2022    Past Surgical History:  Procedure Laterality Date   ABDOMINAL HYSTERECTOMY  1980   CARDIAC CATHETERIZATION  07/27/09 & 07/28/09   CESAREAN SECTION     x 2   CORONARY ARTERY BYPASS GRAFT  08/03/2009   x1 using left internal mammary artery to distal left anterior  descending coronary artery.    GALLBLADDER SURGERY  2001   LEFT HEART CATHETERIZATION WITH CORONARY/GRAFT ANGIOGRAM N/A 07/05/2012   Procedure: LEFT HEART CATHETERIZATION WITH Isabel Caprice;  Surgeon: Wendall Stade, MD;  [facility];  Service: Cardiovascular;  Laterality: N/A;   PACEMAKER IMPLANT N/A 03/17/2020   Procedure: PACEMAKER IMPLANT;  Surgeon: Duke Salvia, MD;  [facility]: [facility] INVASIVE CV LAB;  Service: Cardiovascular;  Laterality: N/A;   RIGHT/LEFT HEART CATH AND CORONARY/GRAFT ANGIOGRAPHY N/A 03/17/2020   Procedure: RIGHT/LEFT HEART CATH AND CORONARY/GRAFT ANGIOGRAPHY;  Surgeon: Iran Ouch,  MD;  Location: [facility] INVASIVE CV LAB;  Service: Cardiovascular;  Laterality: N/A;   SHOULDER SURGERY  1998 / 2001   from accident   rotator cuff   TOTAL HIP ARTHROPLASTY Right 08/30/2022   Procedure: TOTAL HIP ARTHROPLASTY ANTERIOR APPROACH;  Surgeon: Ollen Gross, MD;  Location: [facility] ORS;  Service: Orthopedics;  Laterality: Right;   VESICOVAGINAL FISTULA CLOSURE W/ TAH  1998    Family History  Problem Relation Age of Onset   Heart attack Mother    Heart disease Mother        had pacemaker   High blood pressure Mother    High Cholesterol Mother    Obesity Mother    Cancer Father    Heart attack Father    High blood pressure Father    High  Cholesterol Father    Heart disease Father    Alcoholism Father    Obesity Father    Heart disease Sister    Lung cancer Sister        metastases to brain   Lung cancer Sister    Breast cancer Sister    Cancer Brother        pituitary cancer   Heart disease Brother    Hypertension Brother    Coronary artery disease Son    Thyroid disease Neg Hx    Social History:  reports that she quit smoking about 12 years ago. Her smoking use included cigarettes. She has never used smokeless tobacco. She reports that she does not drink alcohol and does not use drugs.  Allergies:  Allergies  Allergen Reactions   Ceclor [Cefaclor] Other (See Comments)    Lost vision in eye   Hctz [Hydrochlorothiazide] Other (See Comments)    Renal insufficiency   Lipitor [Atorvastatin Calcium] Other (See Comments)    Increased A1C   Norvasc [Amlodipine] Other (See Comments)    Makes patient stiff   Penicillins Hives, Shortness Of Breath, Itching and Rash   Sulfa Antibiotics Other (See Comments)    CAUSES SHOCK   Statins Other (See Comments)    MYALGIAS AND WEAKNESS   Milk-Related Compounds Diarrhea and Nausea And Vomiting   Erythromycin Rash   Latex Rash   Levofloxacin Rash   Tetracyclines & Related Rash    Medications Prior to Admission  Medication Sig Dispense Refill   acetaminophen (TYLENOL) 650 MG CR tablet Take 650 mg by mouth every 8 (eight) hours as needed for pain.     amiodarone (PACERONE) 200 MG tablet Take 1 tablet (200 mg total) by mouth daily. 90 tablet 3   apixaban (ELIQUIS) 5 MG TABS tablet Take 1 tablet (5 mg total) by mouth 2 (two) times daily. 180 tablet 3   cholecalciferol (VITAMIN D3) 25 MCG (1000 UNIT) tablet Take 1,000 Units by mouth daily.     cyanocobalamin (,VITAMIN B-12,) 1000 MCG/ML injection Inject 1,000 mcg into the muscle every 30 (thirty) days.     diltiazem (CARDIZEM CD) 120 MG 24 hr capsule Take 1 capsule (120 mg total) by mouth daily. 90 capsule 3   famotidine  (PEPCID) 20 MG tablet Take 20 mg by mouth daily as needed for heartburn or indigestion.     furosemide (LASIX) 20 MG tablet Take 20-40 tablets by mouth See admin instructions. Take 20 mg daily, may increase to 40 mg daily as needed for excessive swelling     levothyroxine (SYNTHROID) 88 MCG tablet Take 88 mcg by mouth daily before breakfast.     loratadine (CLARITIN) 10  MG tablet Take 10 mg by mouth daily.     metoprolol tartrate (LOPRESSOR) 50 MG tablet TAKE 1 TABLET BY MOUTH TWICE A DAY 90 tablet 3   Multiple Vitamin (MULTIVITAMIN WITH MINERALS) TABS tablet Take 1 tablet by mouth daily.     Multiple Vitamins-Minerals (PRESERVISION AREDS 2) CAPS Take 1 capsule by mouth 2 (two) times daily.     nitroGLYCERIN (NITROSTAT) 0.4 MG SL tablet PLACE 1 TABLET UNDER THE TONGUE EVERY 5 MINUTES AS NEEDED FOR CHEST PAIN 25 tablet 0   Polyethylene Glycol 400 (BLINK TEARS OP) Place 1 drop into both eyes daily as needed (dry eyes).      No results found for this or any previous visit (from the past 48 hour(s)). No results found.  Review of Systems  Blood pressure 122/74, pulse 62, temperature 98.3 F (36.8 C), temperature source Oral, resp. rate 18, height 5\' 2"  (1.575 m), weight 90.7 kg, SpO2 95 %. Physical Exam Vitals reviewed.  Constitutional:      General: She is not in acute distress.    Appearance: Normal appearance.  HENT:     Head: Normocephalic and atraumatic.  Pulmonary:     Effort: Pulmonary effort is normal. No respiratory distress.  Abdominal:     General: There is no distension.     Palpations: Abdomen is soft.     Tenderness: There is no abdominal tenderness.  Musculoskeletal:        General: Normal range of motion.  Skin:    General: Skin is warm and dry.  Neurological:     General: No focal deficit present.     Mental Status: She is alert and oriented to person, place, and time.      Assessment/Plan 77 yo female with a small bowel mesenteric mass, not amenable to  percutaneous biopsy. Proceed to the OR for diagnostic laparoscopy with biopsies vs excision, and possible small bowel resection. I reviewed the procedure details with the patient and informed consent was obtained. Plan for discharge home if no bowel resection performed. All questions were answered.  Fritzi Mandes, MD 03/19/2023, 7:16 AM

## 2023-03-19 NOTE — Anesthesia Postprocedure Evaluation (Signed)
Anesthesia Post Note  Patient: Acupuncturist  Procedure(s) Performed: LAPAROSCOPY DIAGNOSTIC BIOPSY OF MESENTERIC MASS (Abdomen)     Patient location during evaluation: PACU Anesthesia Type: General Level of consciousness: awake and alert Pain management: pain level controlled Vital Signs Assessment: post-procedure vital signs reviewed and stable Respiratory status: spontaneous breathing, nonlabored ventilation, respiratory function stable and patient connected to nasal cannula oxygen Cardiovascular status: blood pressure returned to baseline and stable Postop Assessment: no apparent nausea or vomiting Anesthetic complications: no   No notable events documented.  Last Vitals:  Vitals:   03/19/23 0915 03/19/23 0925  BP: 127/70   Pulse: 60 60  Resp: 14 15  Temp:  36.9 C  SpO2: 95% 97%    Last Pain:  Vitals:   03/19/23 0900  TempSrc:   PainSc: 0-No pain                 Trevor Iha

## 2023-03-19 NOTE — Addendum Note (Signed)
Addendum  created 03/19/23 1002 by Marny Lowenstein, CRNA   Intraprocedure Meds edited

## 2023-03-19 NOTE — Progress Notes (Signed)
[employer] rep, Alean Rinne [phone], notified of patient's pacemaker status post procedure. Pacer dual av pacing at a rate of 60. Magnet placed during procedure as per electrophysiologist recommendation.  [employer] rep stated pacemaker working to it's settings. No further interventions needed.

## 2023-03-19 NOTE — Op Note (Signed)
Date: 03/19/23  Patient: Samantha Short MRN: [id number]  Preoperative Diagnosis: Small bowel mesenteric mass Postoperative Diagnosis: Same  Procedure: Diagnostic laparoscopy with biopsies of mesenteric mass  Surgeon: Sophronia Simas, MD Assistant: Clabe Seal, MD  EBL: 15 mL  Anesthesia: General endotracheal  Specimens: Mesenteric mass  Indications: [name] is a 77 yo female who was admitted in April of this year with hypertensive urgency and on imaging workup was incidentally found to have a mass in the small bowel mesentery. This was not amenable to percutaneous biopsy and she was referred for surgical evaluation. PET/dotatate scan showed mild tracer uptake in the mass, but no other evidence of metastatic disease. After a discussion of the risks and benefits of surgery, she agreed to proceed with diagnostic laparoscopy with biopsies versus possible resection.  Findings: Spiculated, ill-defined mass in the root of the small bowel mesentery, not amenable to resection. Multiple tissue samples were obtained for biopsy.  Procedure details: Informed consent was obtained in the preoperative area prior to the procedure. The patient was brought to the operating room and placed on the table in the supine position. General anesthesia was induced and appropriate lines and drains were placed for intraoperative monitoring. Perioperative antibiotics were administered per SCIP guidelines. The abdomen was prepped and draped in the usual sterile fashion. A pre-procedure timeout was taken verifying patient identity, surgical site and procedure to be performed.  A small skin incision was made in the left upper quadrant at Palmer's point, the fascia was grasped and elevated, and a Veress needle was inserted through the fascia. Intraperitoneal placement was confirmed with the saline drop test and the abdomen was insufflated. A 5mm port was placed and the abdomen was inspected with no visceral or vascular  injury. The diaphragm, liver, and peritoneal surface did not have any nodules concerning for metastatic disease. Additional 5mm ports were placed in the supraumbilical area and in the LLQ, both under direct visualization. The omentum was retracted cephalad to expose the root of the mesentery. There was a spiculated, firm mass visible in the central small bowel mesentery, which was ill-defined. This was clearly not amenable to resection given the central location, so we proceeded with biopsies. A biopsy forceps was used to obtain multiple samples of the mass, taking care not to go too deep into the mesentery in order to avoid bleeding. Cautery was also used to excise several tissue samples and maintain hemostasis. The tissue was sent for routine pathology. The surgical site appeared hemostatic. The ports were removed and the abdomen was desufflated. The port sites were closed with 4-0 monocryl subcuticular sutures. Dermabond was applied.  The patient tolerated the procedure well with no apparent complications. All counts were correct x2 at the end of the procedure. The patient was extubated and taken to PACU in stable condition.  Sophronia Simas, MD 03/19/23 8:17 AM

## 2023-03-19 NOTE — Discharge Instructions (Addendum)
   [facility] DISCHARGE INSTRUCTIONS  Activity Ok to shower in 24 hours, but do not bathe or submerge incisions underwater for 2 weeks. Do not drive while taking narcotic pain medication. You may drive when you are no longer taking prescription pain medication, you can comfortably wear a seatbelt, and you can safely maneuver your car and apply brakes.  Wound Care Your incisions are covered with skin glue called Dermabond. This will peel off on its own over time. You may shower and allow warm soapy water to run over your incisions. Gently pat dry. Do not submerge your incision underwater until cleared by your surgeon. Monitor your incision for any new redness, tenderness, or drainage.  Medications A  prescription for pain medication may be given to you upon discharge.  Take your pain medication as prescribed, if needed.  Take Tylenol 650mg  every 6 hours for pain. For more severe pain that is not covered by Tylenol, you may take the prescribed opiate as needed up to every 6 hours for very severe pain. It is common to experience some constipation if taking pain medication after surgery.  Increasing fluid intake and taking a stool softener (such as Colace) will usually help or prevent this problem from occurring.  A mild laxative (Milk of Magnesia or Miralax) should be taken according to package directions if there are no bowel movements after 48 hours. Take your usually prescribed medications unless otherwise directed. If you need a refill on your pain medication, please contact your pharmacy.  They will contact our office to request authorization. Prescriptions will not be filled after 5 pm or on weekends. You may resume taking your Eliquis in 24 hours after surgery (on the evening of Tuesday, July 9).  When to Call us: Fever greater than 100.5 New redness, drainage, or swelling at incision site Severe pain, nausea, or vomiting Persistent bleeding from incisions  Follow-up You  have an appointment scheduled with Dr. Freida Busman on April 10, 2023 at 9:30am. This will be at the [facility] office at [address], [address], [place], [place]. Please arrive at least 15 minutes prior to your scheduled appointment time.   The clinic staff is available to answer your questions during regular business hours.  Please don't hesitate to call and ask to speak to one of the nurses for clinical concerns.  If you have a medical emergency, go to the nearest emergency room or call 911.  A surgeon from [facility] is always on call at the hospital  [address], [address], [place], [place]  [address] ?  [address] [address], [place], [place]   [address] [phone] ? Toll Free: [phone] ? FAX [phone] Web site: [email or web address]

## 2023-03-20 ENCOUNTER — Encounter (HOSPITAL_COMMUNITY): Payer: Self-pay | Admitting: Surgery

## 2023-03-21 LAB — SURGICAL PATHOLOGY

## 2023-03-23 DIAGNOSIS — G4733 Obstructive sleep apnea (adult) (pediatric): Secondary | ICD-10-CM | POA: Diagnosis not present

## 2023-03-26 DIAGNOSIS — E538 Deficiency of other specified B group vitamins: Secondary | ICD-10-CM | POA: Diagnosis not present

## 2023-03-26 DIAGNOSIS — E039 Hypothyroidism, unspecified: Secondary | ICD-10-CM | POA: Diagnosis not present

## 2023-04-03 NOTE — Progress Notes (Signed)
Remote pacemaker transmission.   

## 2023-04-04 DIAGNOSIS — G4733 Obstructive sleep apnea (adult) (pediatric): Secondary | ICD-10-CM | POA: Diagnosis not present

## 2023-04-09 NOTE — Progress Notes (Unsigned)
Electrophysiology Office Note:   Date:  04/10/2023  ID:  Samantha Short, DOB 09/05/1946, MRN [id number]  Primary Cardiologist: Verne Carrow, MD Electrophysiologist: Sherryl Manges, MD      History of Present Illness:   Samantha Short is a 77 y.o. female with h/o pre-DM, OSA on BiPAP, hyperthyroid s/p ablation, CKD III, CAD s/p CABG (2010), mild non-obs disease on LHC (2021), VHD with AS, AF on eliquis, CHB s/p PPM seen today for routine electrophysiology followup.   Admit to hospital 12/2022 with CP, palpitations, HTN urgency and AFwRVR.  Diltiazem infusion > spontaneous conversion > back to AF.  Amiodarone + metoprolol added. No ACS. Incidential findings of partially calcified central mesenteric mass that was planned for biopsy.   Seen in EP Clinic 02/02/23 and was anxious/emotional about upcoming biopsy, anticipated grief of losing siblings who are in hospice.  Her device was functioning properly, no adjustments made.  Last PACEART check 03/15/2023 showed battery / lead measurements within normal limits, appropriate histograms.   On 03/19/23, the patient underwent a laparoscopic small bowel mass biopsy. Pathology showed acute on chronic inflammation.   Since last being seen in our clinic the patient reports doing fairly well - has a lot of stress currently with her siblings being sick / on hospice. She reports she has been having nocturnal hallucinations for approximately 6 months that she thought might be related to her amiodarone or cardizem.  She does not weigh herself but goes on her LE's for when to increase her lasix dosing. No bleeding or issues with anticoagulation. Her PCP has been adjusting her synthroid and her most recent labs are reportedly normal.   She denies chest pain, palpitations, dyspnea, PND, orthopnea, nausea, vomiting, dizziness, syncope, edema, weight gain, or early satiety.   Review of systems complete and found to be negative unless listed in HPI.    EP  Information / Studies Reviewed:    EKG is not ordered today. EKG from 5/54/54 reviewed which showed NSR 64 with RBB      PPM Interrogation-  reviewed in detail today,  See PACEART report. VP 31.8% (MVP on), AP 64.8%. Battery / leads / threshold stable. NOTE: patient symptomatic with RV pacing / has chest discomfort.   Device History: Medtronic Dual Chamber PPM implanted 03/17/2020 for CHB  Studies: 03/2020 R/HC > mild non-obs CAD, RHC showed moderately elevated filling pressures, moderate PH and normal CO.  01/04/23 ECHO > LVEF 60-65%, no RWMA, RV systolic function normal, normal PASP, estimated RV systolic pressure 28.2, mild MVR, severe calcification of the AV, severe thickening of AV, trivial AVR, mild to moderate AV stenosis.   Risk Assessment/Calculations:     CHA2DS2Vasc 5, 6.7% risk / yr of stroke > on Eliquis            Physical Exam:   VS:  BP 126/70   Pulse 86   Ht 5\' 2"  (1.575 m)   Wt 208 lb 9.6 oz (94.6 kg)   SpO2 96%   BMI 38.15 kg/m    Wt Readings from Last 3 Encounters:  04/10/23 208 lb 9.6 oz (94.6 kg)  03/19/23 200 lb (90.7 kg)  03/13/23 205 lb 8 oz (93.2 kg)     GEN: Well nourished, well developed in no acute distress NECK: No JVD; No carotid bruits CARDIAC: Regular rate and rhythm, 2-3/6 SEM, no rubs, gallops RESPIRATORY:  Clear to auscultation without rales, wheezing or rhonchi  ABDOMEN: Soft, non-tender, non-distended EXTREMITIES:  1+ BLE L>R edema (left  was vein harvest site); No deformity   ASSESSMENT AND PLAN:    CHB s/p Medtronic PPM  -Normal PPM function -See Pace Art report -No changes today  AF  Secondary Hypercoagulable State  -eliquis 5mg  BID, dose reviewed / appropriate by age/weight, Cr 1.47 on 03/13/23 -continue amiodarone > LFT's normal 01/2023, TSH 8.387 03/23/23 (PCP managing) -continue cardizem CD 120mg   -follow up LFT's today and again in 6 months  CAD s/p CABG Non-Obs CAD  LHC 2021 with mild non-obstructive disease  -follows  with Dr. Clifton James  VHD with AS -ECHO as above, per Dr. Clifton James  OSA  -BiPAP compliance encouraged   Hypothyroidism  -per PCP, pt reports most recent labs were "good" per PCP  Hallucinations  Approx 6 months, Nocturnal, does have macular degeneration. No obvious offending medications on list. No OTC use either.  -encouraged patient to follow up with PCP  -significant emotional stressors in life with siblings on hospice   Disposition:   Follow up with Dr. Graciela Husbands in 6 months  Signed, Canary Brim, MSN, APRN, NP-C, AGACNP-BC [facility] - Electrophysiology  04/10/2023, 11:23 AM

## 2023-04-10 ENCOUNTER — Encounter: Payer: Self-pay | Admitting: Pulmonary Disease

## 2023-04-10 ENCOUNTER — Ambulatory Visit: Payer: Medicare HMO | Attending: Physician Assistant | Admitting: Pulmonary Disease

## 2023-04-10 VITALS — BP 126/70 | HR 86 | Ht 62.0 in | Wt 208.6 lb

## 2023-04-10 DIAGNOSIS — Z950 Presence of cardiac pacemaker: Secondary | ICD-10-CM | POA: Diagnosis not present

## 2023-04-10 DIAGNOSIS — G4733 Obstructive sleep apnea (adult) (pediatric): Secondary | ICD-10-CM

## 2023-04-10 DIAGNOSIS — I480 Paroxysmal atrial fibrillation: Secondary | ICD-10-CM

## 2023-04-10 DIAGNOSIS — I10 Essential (primary) hypertension: Secondary | ICD-10-CM

## 2023-04-10 DIAGNOSIS — I2510 Atherosclerotic heart disease of native coronary artery without angina pectoris: Secondary | ICD-10-CM

## 2023-04-10 LAB — CUP PACEART INCLINIC DEVICE CHECK
Battery Remaining Longevity: 133 mo
Battery Voltage: 3.02 V
Brady Statistic AP VP Percent: 20.49 %
Brady Statistic AP VS Percent: 44.39 %
Brady Statistic AS VP Percent: 11.3 %
Brady Statistic AS VS Percent: 23.81 %
Brady Statistic RA Percent Paced: 64.83 %
Brady Statistic RV Percent Paced: 31.85 %
Date Time Interrogation Session: 20240730115920
Implantable Lead Connection Status: 753985
Implantable Lead Connection Status: 753985
Implantable Lead Implant Date: 20210707
Implantable Lead Implant Date: 20210707
Implantable Lead Location: 753859
Implantable Lead Location: 753860
Implantable Lead Model: 5076
Implantable Lead Model: 5076
Implantable Lead Serial Number: 8264443
Implantable Pulse Generator Implant Date: 20210707
Lead Channel Impedance Value: 361 Ohm
Lead Channel Impedance Value: 399 Ohm
Lead Channel Impedance Value: 456 Ohm
Lead Channel Impedance Value: 589 Ohm
Lead Channel Pacing Threshold Amplitude: 0.75 V
Lead Channel Pacing Threshold Amplitude: 1 V
Lead Channel Pacing Threshold Pulse Width: 0.4 ms
Lead Channel Pacing Threshold Pulse Width: 0.4 ms
Lead Channel Sensing Intrinsic Amplitude: 2.75 mV
Lead Channel Sensing Intrinsic Amplitude: 3.625 mV
Lead Channel Sensing Intrinsic Amplitude: 7.375 mV
Lead Channel Sensing Intrinsic Amplitude: 7.625 mV
Lead Channel Setting Pacing Amplitude: 1.5 V
Lead Channel Setting Pacing Amplitude: 2.5 V
Lead Channel Setting Pacing Pulse Width: 0.4 ms
Lead Channel Setting Sensing Sensitivity: 2.8 mV
Zone Setting Status: 755011
Zone Setting Status: 755011

## 2023-04-10 LAB — COMPREHENSIVE METABOLIC PANEL
ALT: 28 IU/L (ref 0–32)
AST: 26 IU/L (ref 0–40)
Albumin: 4.2 g/dL (ref 3.8–4.8)
Alkaline Phosphatase: 98 IU/L (ref 44–121)
BUN/Creatinine Ratio: 19 (ref 12–28)
BUN: 26 mg/dL (ref 8–27)
Bilirubin Total: 0.3 mg/dL (ref 0.0–1.2)
CO2: 24 mmol/L (ref 20–29)
Calcium: 9.6 mg/dL (ref 8.7–10.3)
Chloride: 104 mmol/L (ref 96–106)
Creatinine, Ser: 1.34 mg/dL — ABNORMAL HIGH (ref 0.57–1.00)
Globulin, Total: 2.9 g/dL (ref 1.5–4.5)
Glucose: 93 mg/dL (ref 70–99)
Potassium: 4.4 mmol/L (ref 3.5–5.2)
Sodium: 142 mmol/L (ref 134–144)
Total Protein: 7.1 g/dL (ref 6.0–8.5)
eGFR: 41 mL/min/{1.73_m2} — ABNORMAL LOW (ref >59)

## 2023-04-10 NOTE — Patient Instructions (Signed)
Medication Instructions:   Your physician recommends that you continue on your current medications as directed. Please refer to the Current Medication list given to you today.   *If you need a refill on your cardiac medications before your next appointment, please call your pharmacy*   Lab Work:  CMET TODAY     If you have labs (blood work) drawn today and your tests are completely normal, you will receive your results only by: MyChart Message (if you have MyChart) OR A paper copy in the mail If you have any lab test that is abnormal or we need to change your treatment, we will call you to review the results.   Testing/Procedures: NONE ORDERED  TODAY     Follow-Up: At [facility], you and your health needs are our priority.  As part of our continuing mission to provide you with exceptional heart care, we have created designated Provider Care Teams.  These Care Teams include your primary Cardiologist (physician) and Advanced Practice Providers (APPs -  Physician Assistants and Nurse Practitioners) who all work together to provide you with the care you need, when you need it.  We recommend signing up for the patient portal called "MyChart".  Sign up information is provided on this After Visit Summary.  MyChart is used to connect with patients for Virtual Visits (Telemedicine).  Patients are able to view lab/test results, encounter notes, upcoming appointments, etc.  Non-urgent messages can be sent to your provider as well.   To learn more about what you can do with MyChart, go to [email or web address].    Your next appointment:    6 month(s)  Provider:    You may see Sherryl Manges, MD or one of the following Advanced Practice Providers on your designated Care Team:   Francis Dowse, [place]

## 2023-04-11 ENCOUNTER — Encounter: Payer: Self-pay | Admitting: Physician Assistant

## 2023-04-11 ENCOUNTER — Other Ambulatory Visit: Payer: Self-pay

## 2023-04-11 NOTE — Progress Notes (Signed)
The proposed treatment discussed in conference is for discussion purpose only and is not a binding recommendation.  The patients have not been physically examined, or presented with their treatment options.  Therefore, final treatment plans cannot be decided.  

## 2023-04-11 NOTE — Progress Notes (Addendum)
Cardiology Office Note    Date:  04/13/2023  ID:  Samantha Short, Samantha Short [birth year], MRN [id number] PCP:  Mila Palmer, MD  Cardiologist:  Verne Carrow, MD  Electrophysiologist:  Sherryl Manges, MD   Chief Complaint: f/u CAD, CHF  History of Present Illness: .    Samantha Short is a 77 y.o. female with visit-pertinent history of CAD s/p 1V CABG in 2010, HTN, HLD, GERD, mitral regurgitation, aortic stenosis, chronic HFpEF, high grade AV block s/p MDT permanent pacemaker placement, PAF, OSA who is seen for general cardiology follow-up. She does not tolerate statins and has not tolerated HCTZ or Norvasc in the past. She stopped Zetia due to cost. She has not wished to consider a PCSK9 inhibitor. Last cath was in 2021 with patent LIMA-LAD - native LAD had normal antegrade flow and thus there was competitive flow for the LIMA flow. Medical therapy was recommended. She had prominent V waves on pulmonary capillary wedge pressure (39 mmHg) suggestive of significant mitral regurgitation, but echo only showed mild MR. Last echo 12/2022 showed EF 60-65%, normal RV, mild MR, mild-moderate AS. She has also been known to have PAF, initially diagnosed by her device, prompting transition from ASA to Eliquis. She was last admitted 12/2022 with CP, palpitations, HTN urgency, AF RVR. She was treated with diltiazem with spontaneous conversion back to NSR. Amiodarone and metoprolol were added. She also had an incidental mesenteric mass requiring further workup, and had also been under a lot of family stress. She has two siblings receiving hospice services out of state. On 03/19/23, the patient underwent a laparoscopic small bowel mass biopsy. Pathology showed acute and chronic inflammation with fat necrosis, fibrosis and dystrophic calcification. She saw EP back in follow-up 04/10/23 and was felt to be doin well from rhythm/PPM standpoint. She was reporting some nocturnal hallucinations and was advised to f/u PCP.  She  returns for follow-up overall doing OK from cardiac standpoint except reporting increased LE edema today. She reports that when she swells, it tends to be L>R. This goes back many years per chart. She had normal ABIs for some left leg coolness in 2021. She takes furosemide 20mg  daily with extra tablet daily PRN - she says it usually only takes one day of the extra dose to return to normal. No recent anginal pain. She's had some nausea today which she thinks is related to constipation issues. This has been reported in the past as well. She is taking OTC meds for this. She also thinks this is contributing to some gradual weight gain. Reports chronic unchanged DOE. She also follows with a GI team. Initial BP 140/82, recheck 148/82   Labwork independently reviewed: 03/2023 K 4.4, Cr 1.34 c/w prior, LFTs ok, CBC wnl, TSH wnl by [facility] 03/2023 12/2022 A1c 5.7, LDL 107, trig 50, Mg 2.1, trop neg 04/2022 TSH OK  Prior d-dimers have been minimally elevated but normal when adjusted for age.  ROS: .    Please see the history of present illness.  All other systems are reviewed and otherwise negative.  Studies Reviewed: Marland Kitchen    EKG:  EKG is ordered today, personally reviewed, demonstrating AV paced rhythm 60bpm without acute STT changes baseline artifact  CV Studies: Cardiac studies reviewed are outlined and summarized above. Otherwise please see EMR for full report.  Physical Exam:    VS:  BP (!) 140/82   Pulse 60   Ht 5\' 2"  (1.575 m)   Wt 209 lb 6.4 oz (95  kg)   SpO2 97%   BMI 38.30 kg/m    Wt Readings from Last 3 Encounters:  04/13/23 209 lb 6.4 oz (95 kg)  04/10/23 208 lb 9.6 oz (94.6 kg)  03/19/23 200 lb (90.7 kg)    GEN: Well nourished, well developed in no acute distress NECK: No JVD; No carotid bruits CARDIAC: RRR 2/6 SEM. No rubs or gallops RESPIRATORY:  Clear to auscultation without rales, wheezing or rhonchi  ABDOMEN: Soft, non-tender, non-distended EXTREMITIES: No acute deformity. 1+ BLE  edema with slight prominence of LLE compared to RLE   Asessement and Plan:.    1. CAD s/p CABG, HLD - doing well without recent angina. No longer on ASA due to Eliquis. Continue metoprolol. We revisited discussion of lipid therapy. She is open to speaking to pharmD about options. Will arrange referral. She may also be candidate for GLP1. Her daughter is on Ozempic.  2. HTN with chronic HFpEF - mild hypervolemia on exam today. She report she typically takes an extra Lasix for one day and it goes back down to normal. She will enact this plan for today, OK to take 1 extra tablet daily until swelling resolves. We also discussed SGLT2i therapy. She denies h/o UTIs/yeast infection. We'll trial Jardiance 10mg  daily with BMET in 1 week with clinical f/u ini 4-6 weeks. Also discussed limiting sodium. Of note she is on diltiazem which can cause edema but it looks like this asymmetric edema goes back almost 10 years. If insurance won't cover Jardiance, OK to switch to [place] 10mg  daily.  Initial BP 140/82, recheck 148/82. Her BP was normal the other day here. She will track this at home with med change above. Asked her to notify if she is seeing readings running >130 systolic.  3. Mild MR, mild-moderate AS by echo 12/2022 - continue clinical follow-up, anticipate echo 12/2023, can arrange in follow-up.  4. H/o PPM, PAF - continue management per EP.  HYPERTENSION CONTROL Vitals:   04/13/23 0953 04/13/23 1030  BP: (!) 140/82 (!) 148/80    The patient's blood pressure is elevated above target today.  In order to address the patient's elevated BP: Blood pressure will be monitored at home to determine if medication changes need to be made.; A new medication was prescribed today.         Disposition: F/u with me in 4-6 weeks to f/u SGLT2i initiation.   Signed, Laurann Montana, PA-C

## 2023-04-13 ENCOUNTER — Ambulatory Visit: Payer: Medicare HMO | Attending: Physician Assistant | Admitting: Physician Assistant

## 2023-04-13 ENCOUNTER — Encounter: Payer: Self-pay | Admitting: Physician Assistant

## 2023-04-13 VITALS — BP 148/80 | HR 60 | Ht 62.0 in | Wt 209.4 lb

## 2023-04-13 DIAGNOSIS — I350 Nonrheumatic aortic (valve) stenosis: Secondary | ICD-10-CM

## 2023-04-13 DIAGNOSIS — I340 Nonrheumatic mitral (valve) insufficiency: Secondary | ICD-10-CM | POA: Diagnosis not present

## 2023-04-13 DIAGNOSIS — I5032 Chronic diastolic (congestive) heart failure: Secondary | ICD-10-CM | POA: Diagnosis not present

## 2023-04-13 DIAGNOSIS — I480 Paroxysmal atrial fibrillation: Secondary | ICD-10-CM

## 2023-04-13 DIAGNOSIS — E785 Hyperlipidemia, unspecified: Secondary | ICD-10-CM

## 2023-04-13 DIAGNOSIS — I10 Essential (primary) hypertension: Secondary | ICD-10-CM | POA: Diagnosis not present

## 2023-04-13 DIAGNOSIS — I2510 Atherosclerotic heart disease of native coronary artery without angina pectoris: Secondary | ICD-10-CM | POA: Diagnosis not present

## 2023-04-13 MED ORDER — EMPAGLIFLOZIN 10 MG PO TABS: 10.0000 mg | ORAL_TABLET | Freq: Every day | ORAL | 3 refills | Status: DC

## 2023-04-13 MED ORDER — EMPAGLIFLOZIN 10 MG PO TABS: 10.0000 mg | ORAL_TABLET | Freq: Every day | ORAL | Status: DC

## 2023-04-13 NOTE — Patient Instructions (Signed)
Medication Instructions:  Your physician has recommended you make the following change in your medication:   Start Jardiance 10 mg daily  *If you need a refill on your cardiac medications before your next appointment, please call your pharmacy*   Lab Work: BMET  in 1 week If you have labs (blood work) drawn today and your tests are completely normal, you will receive your results only by: MyChart Message (if you have MyChart) OR A paper copy in the mail If you have any lab test that is abnormal or we need to change your treatment, we will call you to review the results.   Follow-Up: At [facility], you and your health needs are our priority.  As part of our continuing mission to provide you with exceptional heart care, we have created designated Provider Care Teams.  These Care Teams include your primary Cardiologist (physician) and Advanced Practice Providers (APPs -  Physician Assistants and Nurse Practitioners) who all work together to provide you with the care you need, when you need it.   Your next appointment:   4-6 week(s)  Provider:   Ronie Spies

## 2023-04-16 ENCOUNTER — Other Ambulatory Visit: Payer: Self-pay | Admitting: Internal Medicine

## 2023-04-16 NOTE — Progress Notes (Signed)
Pt has N&V Hx and had failed multiple treatments in past

## 2023-04-20 ENCOUNTER — Ambulatory Visit: Payer: Medicare HMO | Attending: Physician Assistant

## 2023-04-20 DIAGNOSIS — E785 Hyperlipidemia, unspecified: Secondary | ICD-10-CM

## 2023-04-20 DIAGNOSIS — I350 Nonrheumatic aortic (valve) stenosis: Secondary | ICD-10-CM | POA: Diagnosis not present

## 2023-04-20 DIAGNOSIS — I480 Paroxysmal atrial fibrillation: Secondary | ICD-10-CM

## 2023-04-20 DIAGNOSIS — I2510 Atherosclerotic heart disease of native coronary artery without angina pectoris: Secondary | ICD-10-CM

## 2023-04-20 DIAGNOSIS — I340 Nonrheumatic mitral (valve) insufficiency: Secondary | ICD-10-CM | POA: Diagnosis not present

## 2023-04-20 DIAGNOSIS — I5032 Chronic diastolic (congestive) heart failure: Secondary | ICD-10-CM

## 2023-04-20 DIAGNOSIS — I10 Essential (primary) hypertension: Secondary | ICD-10-CM

## 2023-04-20 LAB — BASIC METABOLIC PANEL
BUN/Creatinine Ratio: 19 (ref 12–28)
BUN: 25 mg/dL (ref 8–27)
CO2: 21 mmol/L (ref 20–29)
Calcium: 9.5 mg/dL (ref 8.7–10.3)
Chloride: 106 mmol/L (ref 96–106)
Creatinine, Ser: 1.35 mg/dL — ABNORMAL HIGH (ref 0.57–1.00)
Glucose: 135 mg/dL — ABNORMAL HIGH (ref 70–99)
Potassium: 4.5 mmol/L (ref 3.5–5.2)
Sodium: 143 mmol/L (ref 134–144)
eGFR: 41 mL/min/{1.73_m2} — ABNORMAL LOW (ref >59)

## 2023-04-23 DIAGNOSIS — G4733 Obstructive sleep apnea (adult) (pediatric): Secondary | ICD-10-CM | POA: Diagnosis not present

## 2023-04-25 DIAGNOSIS — K5900 Constipation, unspecified: Secondary | ICD-10-CM | POA: Diagnosis not present

## 2023-04-25 DIAGNOSIS — R7303 Prediabetes: Secondary | ICD-10-CM | POA: Diagnosis not present

## 2023-04-25 DIAGNOSIS — E039 Hypothyroidism, unspecified: Secondary | ICD-10-CM | POA: Diagnosis not present

## 2023-04-25 DIAGNOSIS — E538 Deficiency of other specified B group vitamins: Secondary | ICD-10-CM | POA: Diagnosis not present

## 2023-04-25 LAB — LAB REPORT - SCANNED: A1c: 5.7

## 2023-05-03 ENCOUNTER — Encounter: Payer: Self-pay | Admitting: Student

## 2023-05-03 ENCOUNTER — Ambulatory Visit: Payer: Medicare HMO | Attending: Cardiovascular Disease | Admitting: Student

## 2023-05-03 VITALS — BP 150/74 | HR 60 | Ht 62.0 in | Wt 209.0 lb

## 2023-05-03 DIAGNOSIS — E785 Hyperlipidemia, unspecified: Secondary | ICD-10-CM | POA: Diagnosis not present

## 2023-05-03 DIAGNOSIS — I10 Essential (primary) hypertension: Secondary | ICD-10-CM | POA: Diagnosis not present

## 2023-05-03 MED ORDER — EZETIMIBE 10 MG PO TABS: 10.0000 mg | ORAL_TABLET | Freq: Every day | ORAL | 3 refills | Status: DC

## 2023-05-03 MED ORDER — VALSARTAN 80 MG PO TABS: 80.0000 mg | ORAL_TABLET | Freq: Every day | ORAL | 3 refills | Status: DC

## 2023-05-03 NOTE — Assessment & Plan Note (Addendum)
Assessment:  LDL goal: < 70 mg/dl last LDLc 94 mg/dl (65/78/4696)  Currently not on any medications Intolerance to statins - pravastatin 10 mg and 20 mg , myalgia atorvastatin 40 mg-muscle weakness, increased A1c    Was enrolled in bempedoic acid trial - went off the trial and Nexletol is now cost prohibitive  Discussed next potential options (ezetimibe,PCSK-9 inhibitors, and inclisiran); cost, dosing efficacy, side effects  Prefers generic oral medication over brand name injectables  Patient follows Mediterranean diet and does water aerobics and swimming ( 60- 90 min)once a week   Plan: Start taking Zetia 10 mg daily  Will repeat lab - LFT and FLP in 2-3 months

## 2023-05-03 NOTE — Assessment & Plan Note (Signed)
Assessment: BP is uncontrolled in office BP 159/84 mmHg 2nd reading 150/74 (goal<130/80). Last EF 60-65% (12/2022) Takes Amiodarone 200 mg daily, Cardizem CD 120 mg daily Lopressor 50 mg twice daily  and tolerates them well without any side effects Denies SOB, palpitation, chest pain, headaches,or swelling Does not check BP at home, need to buy new home BP monitor  Reiterated the importance of regular exercise and low salt diet   Plan:  Start taking valsartan 80 mg daily  Continue taking current heat medications  Patient to keep record of BP readings with heart rate and report to [place] at the next visit  Bring home monitor for validation at the next OV Patient to see PharmD in 4 weeks for follow up  Follow up lab(s) : BMP on Sept 3 to assess renal function and K level

## 2023-05-03 NOTE — Progress Notes (Signed)
Patient ID: Samantha Short                 DOB: [birth year]                    MRN: [id number]      HPI: Samantha Short is a 77 y.o. female patient referred to lipid clinic by Maryruth Hancock. PMH is significant for CAD s/p 1V CABG in 2010, HTN, HLD, GERD, mitral regurgitation, aortic stenosis, chronic HFpEF, high grade AV block s/p MDT permanent pacemaker placement, PAF, OSA.  Patient reported intolerance to statins, hydrochlorothiazide, and Norvasc. Nexletol was stopped due to cost.  She does not want to consider PCSK9i.  Patient was put on Jardiance on Aug 02 for HTN with chronic HFpEF.patient has shown interest in considering PCSK9i and GLP1 at last visit with Ronie Spies. Her last A1c 3 months ago was 6.0 ( still in prediabetic range) her BMI 38 and Weight 209 lbs. Given hx of CAD  s/p 1V CABG in 2010 with obesity insurance may cover GLP1 will assess coverage for [facility]   Patient presented today in good spirit. Reports she dos not check her BP at home. She was on lisinopril before and her BP was staying in good range when she was on that. Today she is little upset as her GPS took her to the wrong place and she was little late for the appointment. She started taking Jardiance, she tolerates it well and her left leg swelling has improved significantly. Her sister and her 2 best friends husband died so she has been going through lot resentment. She has started following Mediterranean diet and she does water aerobics for 45 min once week and 20 min of swimming once week. She was on bempedoic acid clinical trial and she stayed on Nexletol for few months then cost went up to $500 per month so she has stopped taking it. Currently she is not on any medications for lipid. She had tried different statins over the years and she could not tolerate due to myalgia. We discussed PCSK9i and Ezetimibe. She does not want to go on injectable therapy. But willing to try generic oral medication. Denies SOB,palpitation,  swelling, chest pain or dizziness.   Current Medications: none  Intolerances: statins - pravastatin 10 mg and 20 mg , myalgia atorvastatin 40 mg-muscle weakness, increased A1c , latex allergy - skin rash Risk Factors: CAD s/p 1V CABG in 2010, HTN, HLD LDL goal: <70 mg/dl  Last Lab: 28/41/3244 LDLc 94, TG 58, HDL 61, TC 166   Current HTN/ HFpEF medications: lasix 20 mg daily and prn for swelling, Jardiance 10 mg -  hs not filled yet , Cardizem 120 mg daily, lopressor 50 mg twice daily Previously tried: hydrochlorothiazide- renal insufficiency  and Norvasc- stiffness   BP goal: <130/80  Renal function: eGFR 41 (04/20/2023) Crcl 38 mL/min (Adj BW)  Lab Results  Component Value Date   CREATININE 1.35 (H) 04/20/2023   CREATININE 1.34 (H) 04/10/2023   CREATININE 1.47 (H) 03/13/2023        Diet: mediterranean diet   Exercise: swimming - once week 20 min   Water aerobics - once week 30 min   Family History:  Sister cancer  Brother : cancer   Confirmed patient has no personal or family history of medullary thyroid carcinoma (MTC) or Multiple Endocrine Neoplasia syndrome type 2 (MEN 2).     Labs: Lab Results  Component Value Date   HGBA1C 5.7 (  H) 01/02/2023    Wt Readings from Last 1 Encounters:  05/03/23 209 lb (94.8 kg)    BP Readings from Last 1 Encounters:  05/03/23 (!) 150/74   Pulse Readings from Last 1 Encounters:  05/03/23 60   Labs: Lipid Panel     Component Value Date/Time   CHOL 179 01/02/2023 1732   CHOL 141 05/06/2018 1203   TRIG 50 01/02/2023 1732   HDL 62 01/02/2023 1732   HDL 50 05/06/2018 1203   CHOLHDL 2.9 01/02/2023 1732   VLDL 10 01/02/2023 1732   LDLCALC 107 (H) 01/02/2023 1732   LDLCALC 77 05/06/2018 1203   LABVLDL 14 05/06/2018 1203    Past Medical History:  Diagnosis Date   Anemia    pernicious   Anxiety    Arthritis    Asthma    Chronic diastolic CHF (congestive heart failure) (HCC)    a. 02/2011 Echo: EF 55-60%, Gr2 DD, Mild MR    Chronic kidney disease    stage 3   Complication of anesthesia    Coronary artery disease    a. s/p CABG x 1 2010:  LIMA->LAD.;  b. amdx for CP => LHC 07/04/12: LAD 70-80%, mid RCA 30%, LIMA-LAD patent with competitive flow limiting distal LAD filling, EF 70% with hyperdynamic LV function. Medical therapy continued.   Depression    Dyslipidemia    GERD (gastroesophageal reflux disease)    Headache    hx of migraines   Heart murmur    History of hiatal hernia    History of kidney stones    Hyperlipidemia    Hypertension    Hyperthyroidism    Hypothyroidism    Mild aortic stenosis    Mitral regurgitation    a. mild by echo 02/2011.   PAF (paroxysmal atrial fibrillation) (HCC)    Pneumonia    PONV (postoperative nausea and vomiting)    Pre-diabetes    Presence of permanent cardiac pacemaker    Sleep apnea    diagnosed 08-2022    Current Outpatient Medications on File Prior to Visit  Medication Sig Dispense Refill   acetaminophen (TYLENOL) 650 MG CR tablet Take 650 mg by mouth every 8 (eight) hours as needed for pain.     amiodarone (PACERONE) 200 MG tablet Take 1 tablet (200 mg total) by mouth daily. 90 tablet 3   apixaban (ELIQUIS) 5 MG TABS tablet Take 1 tablet (5 mg total) by mouth 2 (two) times daily. 180 tablet 3   cholecalciferol (VITAMIN D3) 25 MCG (1000 UNIT) tablet Take 1,000 Units by mouth daily.     cyanocobalamin (,VITAMIN B-12,) 1000 MCG/ML injection Inject 1,000 mcg into the muscle every 30 (thirty) days.     diltiazem (CARDIZEM CD) 120 MG 24 hr capsule TAKE 1 CAPSULE BY MOUTH EVERY DAY 90 capsule 3   empagliflozin (JARDIANCE) 10 MG TABS tablet Take 1 tablet (10 mg total) by mouth daily before breakfast. 90 tablet 3   famotidine (PEPCID) 20 MG tablet Take 20 mg by mouth daily as needed for heartburn or indigestion.     furosemide (LASIX) 20 MG tablet Take 20-40 tablets by mouth See admin instructions. Take 20 mg daily, may increase to 40 mg daily as needed for  excessive swelling     levothyroxine (SYNTHROID) 88 MCG tablet Take 88 mcg by mouth daily before breakfast.     loratadine (CLARITIN) 10 MG tablet Take 10 mg by mouth daily.     metoprolol tartrate (LOPRESSOR) 50 MG tablet  TAKE 1 TABLET BY MOUTH TWICE A DAY 90 tablet 3   Multiple Vitamin (MULTIVITAMIN WITH MINERALS) TABS tablet Take 1 tablet by mouth daily.     Multiple Vitamins-Minerals (PRESERVISION AREDS 2) CAPS Take 1 capsule by mouth 2 (two) times daily.     nitroGLYCERIN (NITROSTAT) 0.4 MG SL tablet PLACE 1 TABLET UNDER THE TONGUE EVERY 5 MINUTES AS NEEDED FOR CHEST PAIN 25 tablet 0   Polyethylene Glycol 400 (BLINK TEARS OP) Place 1 drop into both eyes daily as needed (dry eyes).     No current facility-administered medications on file prior to visit.    Allergies  Allergen Reactions   Ceclor [Cefaclor] Other (See Comments)    Lost vision in eye   Hctz [Hydrochlorothiazide] Other (See Comments)    Renal insufficiency   Lipitor [Atorvastatin Calcium] Other (See Comments)    Increased A1C   Norvasc [Amlodipine] Other (See Comments)    Makes patient stiff   Penicillins Hives, Shortness Of Breath, Itching and Rash   Sulfa Antibiotics Other (See Comments)    CAUSES SHOCK   Statins Other (See Comments)    MYALGIAS AND WEAKNESS   Milk-Related Compounds Diarrhea and Nausea And Vomiting   Erythromycin Rash   Latex Rash   Levofloxacin Rash   Tetracyclines & Related Rash    Assessment/Plan:  1. Hyperlipidemia -  Problem  Hyperlipidemia  Hypertension   Hypertension Assessment: BP is uncontrolled in office BP 159/84 mmHg 2nd reading 150/74 (goal<130/80). Last EF 60-65% (12/2022) Takes Amiodarone 200 mg daily, Cardizem CD 120 mg daily Lopressor 50 mg twice daily  and tolerates them well without any side effects Denies SOB, palpitation, chest pain, headaches,or swelling Does not check BP at home, need to buy new home BP monitor  Reiterated the importance of regular exercise and  low salt diet   Plan:  Start taking valsartan 80 mg daily  Continue taking current heat medications  Patient to keep record of BP readings with heart rate and report to [place] at the next visit  Bring home monitor for validation at the next OV Patient to see PharmD in 4 weeks for follow up  Follow up lab(s) : BMP on Sept 3 to assess renal function and K level    Hyperlipidemia Assessment:  LDL goal: < 70 mg/dl last LDLc 94 mg/dl (16/06/9603)  Currently not on any medications Intolerance to statins - pravastatin 10 mg and 20 mg , myalgia atorvastatin 40 mg-muscle weakness, increased A1c    Was enrolled in bempedoic acid trial - went off the trial and Nexletol is now cost prohibitive  Discussed next potential options (ezetimibe,PCSK-9 inhibitors, and inclisiran); cost, dosing efficacy, side effects  Prefers generic oral medication over brand name injectables  Patient follows Mediterranean diet and does water aerobics and swimming ( 60- 90 min)once a week   Plan: Start taking Zetia 10 mg daily  Will repeat lab - LFT and FLP in 2-3 months      Thank you,  Carmela Hurt, Pharm.D [facility] [address] [place], [place] [address]  Phone: [phone]; Fax: [phone]

## 2023-05-03 NOTE — Patient Instructions (Signed)
Changes made by your pharmacist Carmela Hurt, PharmD at today's visit:    Instructions/Changes  (what do you need to do) Your Notes  (what you did and when you did it)  Start taking valsartan 80 mg daily and Zetia 10 mg daily    BMP -lab due on Sept 3 at [facility] office    Lipid and liver function lab due Nov 22 at [facility] office     Bring all of your meds, your BP cuff and your record of home blood pressures to your next appointment.    HOW TO TAKE YOUR BLOOD PRESSURE AT HOME  Rest 5 minutes before taking your blood pressure.  Don't smoke or drink caffeinated beverages for at least 30 minutes before. Take your blood pressure before (not after) you eat. Sit comfortably with your back supported and both feet on the floor (don't cross your legs). Elevate your arm to heart level on a table or a desk. Use the proper sized cuff. It should fit smoothly and snugly around your bare upper arm. There should be enough room to slip a fingertip under the cuff. The bottom edge of the cuff should be 1 inch above the crease of the elbow. Ideally, take 3 measurements at one sitting and record the average.  Important lifestyle changes to control high blood pressure  Intervention  Effect on the BP  Lose extra pounds and watch your waistline Weight loss is one of the most effective lifestyle changes for controlling blood pressure. If you're overweight or obese, losing even a small amount of weight can help reduce blood pressure. Blood pressure might go down by about 1 millimeter of mercury (mm Hg) with each kilogram (about 2.2 pounds) of weight lost.  Exercise regularly As a general goal, aim for at least 30 minutes of moderate physical activity every day. Regular physical activity can lower high blood pressure by about 5 to 8 mm Hg.  Eat a healthy diet Eating a diet rich in whole grains, fruits, vegetables, and low-fat dairy products and low in saturated fat and cholesterol. A healthy diet can lower high blood  pressure by up to 11 mm Hg.  Reduce salt (sodium) in your diet Even a small reduction of sodium in the diet can improve heart health and reduce high blood pressure by about 5 to 6 mm Hg.  Limit alcohol One drink equals 12 ounces of beer, 5 ounces of wine, or 1.5 ounces of 80-proof liquor.  Limiting alcohol to less than one drink a day for women or two drinks a day for men can help lower blood pressure by about 4 mm Hg.   If you have any questions or concerns please use My Chart to send questions or call the office at [phone]

## 2023-05-15 ENCOUNTER — Encounter: Payer: Self-pay | Admitting: Pharmacist

## 2023-05-15 DIAGNOSIS — I10 Essential (primary) hypertension: Secondary | ICD-10-CM | POA: Diagnosis not present

## 2023-05-15 LAB — BASIC METABOLIC PANEL
BUN/Creatinine Ratio: 18 (ref 12–28)
BUN: 26 mg/dL (ref 8–27)
CO2: 24 mmol/L (ref 20–29)
Calcium: 9.6 mg/dL (ref 8.7–10.3)
Chloride: 105 mmol/L (ref 96–106)
Creatinine, Ser: 1.48 mg/dL — ABNORMAL HIGH (ref 0.57–1.00)
Glucose: 88 mg/dL (ref 70–99)
Potassium: 4.9 mmol/L (ref 3.5–5.2)
Sodium: 141 mmol/L (ref 134–144)
eGFR: 36 mL/min/{1.73_m2} — ABNORMAL LOW (ref >59)

## 2023-05-15 NOTE — Telephone Encounter (Signed)
error 

## 2023-05-16 ENCOUNTER — Telehealth: Payer: Self-pay | Admitting: Pharmacist

## 2023-05-16 DIAGNOSIS — H353132 Nonexudative age-related macular degeneration, bilateral, intermediate dry stage: Secondary | ICD-10-CM | POA: Diagnosis not present

## 2023-05-16 DIAGNOSIS — Z961 Presence of intraocular lens: Secondary | ICD-10-CM | POA: Diagnosis not present

## 2023-05-16 DIAGNOSIS — H16223 Keratoconjunctivitis sicca, not specified as Sjogren's, bilateral: Secondary | ICD-10-CM | POA: Diagnosis not present

## 2023-05-16 NOTE — Telephone Encounter (Signed)
Spoke to patient, discussed last BMP result. Patient states she forgot to drink enough water when she gets busy. Reiterated importance of hydration.

## 2023-05-17 ENCOUNTER — Telehealth: Payer: Self-pay | Admitting: Cardiovascular Disease

## 2023-05-17 MED ORDER — APIXABAN 5 MG PO TABS: 5.0000 mg | ORAL_TABLET | Freq: Two times a day (BID) | ORAL | 1 refills | Status: DC

## 2023-05-17 NOTE — Telephone Encounter (Signed)
Patient calling the office for samples of medication:   1.  What medication and dosage are you requesting samples for?   apixaban (ELIQUIS) 5 MG TABS tablet    empagliflozin (JARDIANCE) 10 MG TABS tablet    2.  Are you currently out of this medication? No - running low on both

## 2023-05-17 NOTE — Telephone Encounter (Signed)
Called pt to inform her about patient assistance and pt stated that she would be bringing that applications into the office tomorrow.

## 2023-05-17 NOTE — Telephone Encounter (Signed)
Looks like pt needed updated rx for Eliquis which I have sent in, she has an active rx for Jardiance. Should be able to fill both meds at the pharmacy. If cost is an issue, would encourage her to apply for pt assistance. Also sees Susa Day for follow up in a few weeks if she prefers to discuss then in person.

## 2023-05-18 ENCOUNTER — Telehealth: Payer: Self-pay

## 2023-05-18 MED ORDER — APIXABAN 5 MG PO TABS: 5.0000 mg | ORAL_TABLET | Freq: Two times a day (BID) | ORAL | Status: DC

## 2023-05-18 MED ORDER — EMPAGLIFLOZIN 10 MG PO TABS: 10.0000 mg | ORAL_TABLET | Freq: Every day | ORAL | Status: AC

## 2023-05-18 NOTE — Telephone Encounter (Signed)
**Note De-Identified Mekhi Lascola Obfuscation** I have completed the providers page of the pts [employer] application for Jardiance assistance and have e-mailed all to Dr Gibson Ramp nurse so she can obtain his signature, date it, and to then fax all to [facility] Cares at the fax number written on the cover letter included.

## 2023-05-18 NOTE — Telephone Encounter (Signed)
Patient has dropped off financial assistance paperwork for some medications.  I have placed them in your folder at the [employer].  Thank you.

## 2023-05-18 NOTE — Telephone Encounter (Signed)
Pt's patient assistance applications for Jardiance and Eliquis was scanned to Carl, [employer]. Leaving pt 2 weeks of samples of Eliquis 5 mg tablets and 2 weeks of Jardiance 10 mg tablets at [facility] front desk for pt to pick up. FYI

## 2023-05-21 ENCOUNTER — Telehealth: Payer: Self-pay | Admitting: Cardiovascular Disease

## 2023-05-21 NOTE — Telephone Encounter (Signed)
Pt is requesting a callback regarding lab orders. Please advise

## 2023-05-21 NOTE — Telephone Encounter (Signed)
Signed forms faxed.

## 2023-05-22 NOTE — Telephone Encounter (Signed)
Called patient, patient was wondering about FLP and LFT when to re-check. Zetia started on Sept 22 os f/u labs are due on Nov 22 pt made aware

## 2023-05-22 NOTE — Telephone Encounter (Signed)
Called back, VM box is full

## 2023-05-23 NOTE — Progress Notes (Addendum)
Cardiology Office Note    Date:  05/25/2023  ID:  Samantha Short, Samantha Short Oct 03, 1945, MRN [id number] PCP:  Mila Palmer, MD  Cardiologist:  Verne Carrow, MD  Electrophysiologist:  Sherryl Manges, MD   Chief Complaint: f/u edema  History of Present Illness: .    Samantha Short is a 77 y.o. female with visit-pertinent history of CAD s/p 1V CABG in 2010, HTN, HLD, GERD, mitral regurgitation, aortic stenosis, chronic HFpEF, high grade AV block s/p MDT permanent pacemaker placement, PAF, CKD 3b, OSA [facility] by [facility] who is seen for general cardiology follow-up.   She does not tolerate statins and has not tolerated HCTZ or Norvasc in the past. She previously stopped Zetia due to cost. She has not wished to consider any injectable medicines. Last cath was in 2021 with patent LIMA-LAD - native LAD had normal antegrade flow and thus there was competitive flow for the LIMA flow. Medical therapy was recommended. She had prominent V waves on PCWP (39 mmHg) suggestive of significant MR but echo only showed mild MR. Last echo 12/2022 showed EF 60-65%, normal RV, mild MR, mild-moderate AS. She has also been known to have PAF initially diagnosed by her device, prompting transition from ASA to Eliquis. She was admitted 12/2022 with CP, palpitations, HTN urgency, AF RVR. She was treated with diltiazem with spontaneous conversion back to NSR. Amiodarone and metoprolol were added. She also had an incidental mesenteric mass requiring further workup, and had also been under a lot of family stress. In 03/2023, the patient underwent biopsy showing acute and chronic inflammation with fat necrosis, fibrosis and dystrophic calcification. She saw EP back in follow-up 04/10/23 and was felt to be doing well from rhythm/PPM standpoint. I saw her in follow-up 04/13/23 and BP was elevated. She had some increased edema on exam (chronically L>R going back many years) so Jardiance was started. Pharm lipid clinic re-started Zetia and  added valsartan 80mg  on 05/03/23.  She is seen for follow-up today. She reports that her edema has improved quite a bit. She clarifies she takes Lasix 20mg  alternating with 40mg  every other day. She reports she felt "wonderful" several days after starting the Jardiance but then a few days later began to notice headache and clear nasal congestion in the morning. She is simultaneously getting used to a new CPAP machine as well. She also reports some persistently blurred vision but this was present even before going on the [place]. She has been seeing her eye doctor for this and reports no clear problem identified. There are no focal neurologic features with this, such as visual field cut/defect; just a generalized blurred vision going on for over a month now. She also has noticed decreased hearing as well. Her sister recently passed away. Her brother remains in hospice out of state. Home BPs have been well controlled.  Labwork independently reviewed: 05/15/23 K 4.9, Cr 1.48 04/2023 scan TSH wnl 03/2023 K 4.4, Cr 1.34 c/w prior range 1.2-1.5, LFTs ok, CBC wnl, TSH wnl by [facility] 03/2023   ROS: .    Please see the history of present illness.  All other systems are reviewed and otherwise negative.  Studies Reviewed: Marland Kitchen    EKG:  EKG is not ordered today.   CV Studies: Cardiac studies reviewed are outlined and summarized above. Otherwise please see EMR for full report.   Current Reported Medications:.    Current Meds  Medication Sig   acetaminophen (TYLENOL) 650 MG CR tablet Take 650 mg by  mouth every 8 (eight) hours as needed for pain.   amiodarone (PACERONE) 200 MG tablet Take 1 tablet (200 mg total) by mouth daily.   apixaban (ELIQUIS) 5 MG TABS tablet Take 1 tablet (5 mg total) by mouth 2 (two) times daily.   cholecalciferol (VITAMIN D3) 25 MCG (1000 UNIT) tablet Take 1,000 Units by mouth daily.   cyanocobalamin (,VITAMIN B-12,) 1000 MCG/ML injection Inject 1,000 mcg into the muscle every 30  (thirty) days.   diltiazem (CARDIZEM CD) 120 MG 24 hr capsule TAKE 1 CAPSULE BY MOUTH EVERY DAY   empagliflozin (JARDIANCE) 10 MG TABS tablet Take 1 tablet (10 mg total) by mouth daily before breakfast.   ezetimibe (ZETIA) 10 MG tablet Take 1 tablet (10 mg total) by mouth daily.   famotidine (PEPCID) 20 MG tablet Take 20 mg by mouth daily as needed for heartburn or indigestion.   furosemide (LASIX) 20 MG tablet Take 20-40 tablets by mouth See admin instructions. Take 20 mg daily, may increase to 40 mg daily as needed for excessive swelling   levothyroxine (SYNTHROID) 88 MCG tablet Take 88 mcg by mouth daily before breakfast.   loratadine (CLARITIN) 10 MG tablet Take 10 mg by mouth daily.   metoprolol tartrate (LOPRESSOR) 50 MG tablet TAKE 1 TABLET BY MOUTH TWICE A DAY   Multiple Vitamin (MULTIVITAMIN WITH MINERALS) TABS tablet Take 1 tablet by mouth daily.   Multiple Vitamins-Minerals (PRESERVISION AREDS 2) CAPS Take 1 capsule by mouth 2 (two) times daily.   nitroGLYCERIN (NITROSTAT) 0.4 MG SL tablet PLACE 1 TABLET UNDER THE TONGUE EVERY 5 MINUTES AS NEEDED FOR CHEST PAIN   Polyethylene Glycol 400 (BLINK TEARS OP) Place 1 drop into both eyes daily as needed (dry eyes).   valsartan (DIOVAN) 80 MG tablet Take 1 tablet (80 mg total) by mouth daily.    Physical Exam:    VS:  BP (!) 152/70   Pulse 60   Ht 5\' 2"  (1.575 m)   Wt 207 lb 6.4 oz (94.1 kg)   SpO2 99%   BMI 37.93 kg/m    Wt Readings from Last 3 Encounters:  05/25/23 207 lb 6.4 oz (94.1 kg)  05/03/23 209 lb (94.8 kg)  04/13/23 209 lb 6.4 oz (95 kg)    GEN: Well nourished, well developed in no acute distress NECK: No JVD; No carotid bruits CARDIAC: RRR, 2/6 SEM, no rubs or gallops RESPIRATORY:  Clear to auscultation without rales, wheezing or rhonchi  ABDOMEN: Soft, non-tender, non-distended EXTREMITIES:  Mild LLE edema still present but less prominent, RLE OK, No acute deformity   Asessement and Plan:.    1. Lower extremity  edema, chronic HFpEF - edema improved with addition of Jardiance. There is a chronic asymmetric component therefore I do not expect complete resolution but there is definite improvement from previous visit. She also takes Lasix 20mg  alternating with 40mg  every other day. Of note she does report some chronic blurry vision (occurring before Jardiance) as well as headache and nasal congestion in the mornings which she wonders if due to the [place]. She felt great the first few days she took this. She has also been working with new CPAP around this time. The symptoms are relatively vague and I cannot say for sure they are med related. We will have her trial off Jardiance over the weekend and give an update on Monday - if still present, this would suggest they were not medication related and I would recommend to restart Jardiance. If they  resolved off Jardiance, can trial [place] 10mg  daily instead. I also asked her to check her BP during any episodes that arise and relay that information to [place]. I also recommended she follow-up with her PCP since these are nonspecific and she has also been concerned about some hearing loss as well.   2. HTN, CKD 3b - PharmD recently started valsartan. Today BP 152/70 recheck by me 138/72. She brings in a log of home BP readings which are within goal of <130/80. (She reports h/o white coat HTN.) She had one random outlier in the 90s/30s with normal recheck 30 minutes later. Will not make any additional changes today since home BPs were controlled. We will recheck BMET today. She seeks Dr. Glenna Fellows with [facility] in November. She will keep f/u as scheduled with pharmD 06/04/23 and bring BP log to the visit. She may benefit from discussion of GLP1 at that visit. Her daughter is on Ozempic.  3. CAD s/p CABG, HLD - doing well without recurrent angina. No longer on ASA, due to Eliquis. Continue metoprolol. She is back on Zetia now. Recheck CMET/lipids after 08/03/23 per pharmD notes.  4. Mild  MR, milld-moderate AS by echo 12/2022 - continue clinical follow-up, would consider echo 12/2023, can arrange in follow-up.  5. PAF with h/o PPM as well - continue management per EP. Remains on Eliquis, dose appropriate.     Disposition: F/u with pharmD 9/23 as scheduled, me 3-4 months.  Signed, Laurann Montana, PA-C

## 2023-05-24 DIAGNOSIS — G4733 Obstructive sleep apnea (adult) (pediatric): Secondary | ICD-10-CM | POA: Diagnosis not present

## 2023-05-25 ENCOUNTER — Ambulatory Visit: Payer: Medicare HMO | Attending: Physician Assistant | Admitting: Physician Assistant

## 2023-05-25 ENCOUNTER — Encounter: Payer: Self-pay | Admitting: Physician Assistant

## 2023-05-25 VITALS — BP 138/72 | HR 60 | Ht 62.0 in | Wt 207.4 lb

## 2023-05-25 DIAGNOSIS — I5032 Chronic diastolic (congestive) heart failure: Secondary | ICD-10-CM | POA: Diagnosis not present

## 2023-05-25 DIAGNOSIS — I350 Nonrheumatic aortic (valve) stenosis: Secondary | ICD-10-CM

## 2023-05-25 DIAGNOSIS — R600 Localized edema: Secondary | ICD-10-CM

## 2023-05-25 DIAGNOSIS — I480 Paroxysmal atrial fibrillation: Secondary | ICD-10-CM | POA: Diagnosis not present

## 2023-05-25 DIAGNOSIS — I340 Nonrheumatic mitral (valve) insufficiency: Secondary | ICD-10-CM | POA: Diagnosis not present

## 2023-05-25 DIAGNOSIS — Z950 Presence of cardiac pacemaker: Secondary | ICD-10-CM

## 2023-05-25 DIAGNOSIS — E785 Hyperlipidemia, unspecified: Secondary | ICD-10-CM | POA: Diagnosis not present

## 2023-05-25 DIAGNOSIS — I2510 Atherosclerotic heart disease of native coronary artery without angina pectoris: Secondary | ICD-10-CM

## 2023-05-25 DIAGNOSIS — N1832 Chronic kidney disease, stage 3b: Secondary | ICD-10-CM

## 2023-05-25 DIAGNOSIS — I10 Essential (primary) hypertension: Secondary | ICD-10-CM | POA: Diagnosis not present

## 2023-05-25 NOTE — Patient Instructions (Addendum)
Medication Instructions:  Your physician recommends that you continue on your current medications as directed. Please refer to the Current Medication list given to you today.  **Hold your Jardiance over the weekend and call us with an update on Monday 05/28/2023.**  *If you need a refill on your cardiac medications before your next appointment, please call your pharmacy*   Lab Work: TODAY: BMET After 08/03/23: fasting CMET, Lipids If you have labs (blood work) drawn today and your tests are completely normal, you will receive your results only by: MyChart Message (if you have MyChart) OR A paper copy in the mail If you have any lab test that is abnormal or we need to change your treatment, we will call you to review the results.  Testing/Procedures: None ordered today.  Follow-Up: At [facility], you and your health needs are our priority.  As part of our continuing mission to provide you with exceptional heart care, we have created designated Provider Care Teams.  These Care Teams include your primary Cardiologist (physician) and Advanced Practice Providers (APPs -  Physician Assistants and Nurse Practitioners) who all work together to provide you with the care you need, when you need it.  Your next appointment:   3-4 month(s)  The format for your next appointment:   In Person  Provider:   Ronie Spies, PA-C

## 2023-05-26 LAB — BASIC METABOLIC PANEL
BUN/Creatinine Ratio: 18 (ref 12–28)
BUN: 27 mg/dL (ref 8–27)
CO2: 24 mmol/L (ref 20–29)
Calcium: 10.2 mg/dL (ref 8.7–10.3)
Chloride: 103 mmol/L (ref 96–106)
Creatinine, Ser: 1.52 mg/dL — ABNORMAL HIGH (ref 0.57–1.00)
Glucose: 87 mg/dL (ref 70–99)
Potassium: 4.9 mmol/L (ref 3.5–5.2)
Sodium: 141 mmol/L (ref 134–144)
eGFR: 35 mL/min/{1.73_m2} — ABNORMAL LOW (ref >59)

## 2023-05-28 ENCOUNTER — Telehealth: Payer: Self-pay

## 2023-05-28 DIAGNOSIS — E538 Deficiency of other specified B group vitamins: Secondary | ICD-10-CM | POA: Diagnosis not present

## 2023-05-28 NOTE — Telephone Encounter (Signed)
-----   Message from Laurann Montana sent at 05/28/2023  7:53 AM EDT ----- Please let pt know that her labs are generally stable compared to prior baseline looking back over span of last 2 years. Per Friday OV she was supposed to hold Jardiance over the weekend and let us know how she was doing today (was having some odd symptoms like headache, nasal congestion in the mornings not typical of reaction to [place]). If symptoms did not change off Jardiance, resume previous dosing of Jardiance 10mg  once daily. If these symptoms completely resolved off Jardiance, would offer trial of Farxiga 10mg  daily with BMET when she returns for pharmD visit on 9/23  - looks like that is at [employer]. Thank you!

## 2023-05-28 NOTE — Telephone Encounter (Signed)
Call to patient to advise  that per Dayna, labs are stable compared to prior over 2 years. Patient verbalizes understanding.   Reviewed treatment plan of stopping jardiance over the weekend due to nasal congestion/headache, patient states she did stop jardiance and did not notice a big change. Advised patient to continue on Jardiance.

## 2023-05-31 DIAGNOSIS — I10 Essential (primary) hypertension: Secondary | ICD-10-CM | POA: Diagnosis not present

## 2023-05-31 DIAGNOSIS — G4733 Obstructive sleep apnea (adult) (pediatric): Secondary | ICD-10-CM | POA: Diagnosis not present

## 2023-05-31 DIAGNOSIS — E669 Obesity, unspecified: Secondary | ICD-10-CM | POA: Diagnosis not present

## 2023-06-04 ENCOUNTER — Telehealth: Payer: Self-pay | Admitting: Cardiovascular Disease

## 2023-06-04 ENCOUNTER — Ambulatory Visit: Payer: Medicare HMO | Attending: Cardiology | Admitting: Student

## 2023-06-04 VITALS — BP 123/72 | HR 60 | Ht 62.0 in | Wt 207.0 lb

## 2023-06-04 DIAGNOSIS — I5032 Chronic diastolic (congestive) heart failure: Secondary | ICD-10-CM

## 2023-06-04 MED ORDER — SPIRONOLACTONE 25 MG PO TABS: 12.5000 mg | ORAL_TABLET | Freq: Every day | ORAL | 3 refills | Status: DC

## 2023-06-04 NOTE — Telephone Encounter (Signed)
Pt calling to f/u on Pt Assistance application for Eliquis. Pt states that she dropped applications off on 9/6.    Patient calling the office for samples of medication:  Patient calling the office for samples of medication:   1.  What medication and dosage are you requesting samples for? apixaban (ELIQUIS) 5 MG TABS tablet   2.  Are you currently out of this medication?  Yes

## 2023-06-04 NOTE — Telephone Encounter (Signed)
Called and spoke w the patient.  She has 3 more tablets of Eliquis 5 mg and she so she will be out by tomorrow night.  She called the [facility] who told her they did not receive the application for assistance.  I refaxed at this time.  The patient will come by the office tomorrow for samples so she does not run out.

## 2023-06-04 NOTE — Progress Notes (Unsigned)
Patient ID: Samantha Short                 DOB: [birth year]                    MRN: [id number]      HPI: Samantha Short is a 77 y.o. female patient referred to lipid clinic by Maryruth Hancock. PMH is significant for CAD s/p 1V CABG in 2010, HTN, HLD, GERD, mitral regurgitation, aortic stenosis, chronic HFpEF, high grade AV block s/p MDT permanent pacemaker placement, PAF, OSA.  Patient reported intolerance to statins, hydrochlorothiazide, and Norvasc. Nexletol was stopped due to cost.  She does not want to consider PCSK9i.  Patient was put on Jardiance on Aug 02 for HTN with chronic HFpEF.patient has shown interest in considering PCSK9i and GLP1 at last visit with Ronie Spies. Her last A1c 3 months ago was 6.0 ( still in prediabetic range) her BMI 38 and Weight 209 lbs. Given hx of CAD  s/p 1V CABG in 2010 with obesity insurance may cover GLP1 will assess coverage for [facility].   Patient presented today in good spirit. Reports she dos not check her BP at home. She was on lisinopril before and her BP was staying in good range when she was on that. Today she is little upset as her GPS took her to the wrong place and she was little late for the appointment. She started taking Jardiance, she tolerates it well and her left leg swelling has improved significantly. Her sister and her 2 best friends husband died so she has been going through lot resentment. She has started following Mediterranean diet and she does water aerobics for 45 min once week and 20 min of swimming once week. She was on bempedoic acid clinical trial and she stayed on Nexletol for few months then cost went up to $500 per month so she has stopped taking it. Currently she is not on any medications for lipid. She had tried different statins over the years and she could not tolerate due to myalgia. We discussed PCSK9i and Ezetimibe. She does not want to go on injectable therapy. But willing to try generic oral medication. Denies SOB,palpitation,  swelling, chest pain or dizziness.  First week legs were hurting  home BP ~ 120-130/ 64 139/90 highest one time. Taking lasix 20 mg daily. Incontinence - SOB  is improving and no longer has headache.     Current HTN/ HFpEF medications: lasix 20 mg daily and prn for swelling, Jardiance 10 mg -  has not filled yet , Cardizem 120 mg daily, lopressor 50 mg twice daily, Diovan Previously tried: hydrochlorothiazide- renal insufficiency  and Norvasc- stiffness   BP goal: <130/80  Renal function: eGFR 41 (04/20/2023) Crcl 38 mL/min (Adj BW)  Lab Results  Component Value Date   CREATININE 1.52 (H) 05/25/2023   CREATININE 1.48 (H) 05/15/2023   CREATININE 1.35 (H) 04/20/2023        Diet: mediterranean diet   Exercise: swimming - once week 20 min   Water aerobics - once week 30 min  Walking 30 min per day   Family History:  Sister cancer  Brother : cancer   Confirmed patient has no personal or family history of medullary thyroid carcinoma (MTC) or Multiple Endocrine Neoplasia syndrome type 2 (MEN 2).     Labs: Lab Results  Component Value Date   HGBA1C 5.7 (H) 01/02/2023    Wt Readings from Last 1 Encounters:  05/25/23 207  lb 6.4 oz (94.1 kg)    BP Readings from Last 1 Encounters:  05/25/23 138/72   Pulse Readings from Last 1 Encounters:  05/25/23 60   Labs: Lipid Panel     Component Value Date/Time   CHOL 179 01/02/2023 1732   CHOL 141 05/06/2018 1203   TRIG 50 01/02/2023 1732   HDL 62 01/02/2023 1732   HDL 50 05/06/2018 1203   CHOLHDL 2.9 01/02/2023 1732   VLDL 10 01/02/2023 1732   LDLCALC 107 (H) 01/02/2023 1732   LDLCALC 77 05/06/2018 1203   LABVLDL 14 05/06/2018 1203    Past Medical History:  Diagnosis Date   Anemia    pernicious   Anxiety    Arthritis    Asthma    Chronic diastolic CHF (congestive heart failure) (HCC)    a. 02/2011 Echo: EF 55-60%, Gr2 DD, Mild MR   Chronic kidney disease    stage 3   Complication of anesthesia    Coronary artery  disease    a. s/p CABG x 1 2010:  LIMA->LAD.;  b. amdx for CP => LHC 07/04/12: LAD 70-80%, mid RCA 30%, LIMA-LAD patent with competitive flow limiting distal LAD filling, EF 70% with hyperdynamic LV function. Medical therapy continued.   Depression    Dyslipidemia    GERD (gastroesophageal reflux disease)    Headache    hx of migraines   Heart murmur    History of hiatal hernia    History of kidney stones    Hyperlipidemia    Hypertension    Hyperthyroidism    Hypothyroidism    Mild aortic stenosis    Mitral regurgitation    a. mild by echo 02/2011.   PAF (paroxysmal atrial fibrillation) (HCC)    Pneumonia    PONV (postoperative nausea and vomiting)    Pre-diabetes    Presence of permanent cardiac pacemaker    Sleep apnea    diagnosed 08-2022    Current Outpatient Medications on File Prior to Visit  Medication Sig Dispense Refill   acetaminophen (TYLENOL) 650 MG CR tablet Take 650 mg by mouth every 8 (eight) hours as needed for pain.     amiodarone (PACERONE) 200 MG tablet Take 1 tablet (200 mg total) by mouth daily. 90 tablet 3   apixaban (ELIQUIS) 5 MG TABS tablet Take 1 tablet (5 mg total) by mouth 2 (two) times daily. 28 tablet    cholecalciferol (VITAMIN D3) 25 MCG (1000 UNIT) tablet Take 1,000 Units by mouth daily.     cyanocobalamin (,VITAMIN B-12,) 1000 MCG/ML injection Inject 1,000 mcg into the muscle every 30 (thirty) days.     diltiazem (CARDIZEM CD) 120 MG 24 hr capsule TAKE 1 CAPSULE BY MOUTH EVERY DAY 90 capsule 3   empagliflozin (JARDIANCE) 10 MG TABS tablet Take 1 tablet (10 mg total) by mouth daily before breakfast. 14 tablet    ezetimibe (ZETIA) 10 MG tablet Take 1 tablet (10 mg total) by mouth daily. 90 tablet 3   famotidine (PEPCID) 20 MG tablet Take 20 mg by mouth daily as needed for heartburn or indigestion.     furosemide (LASIX) 20 MG tablet Take 20-40 tablets by mouth See admin instructions. Take 20 mg daily, may increase to 40 mg daily as needed for  excessive swelling     levothyroxine (SYNTHROID) 88 MCG tablet Take 88 mcg by mouth daily before breakfast.     loratadine (CLARITIN) 10 MG tablet Take 10 mg by mouth daily.  metoprolol tartrate (LOPRESSOR) 50 MG tablet TAKE 1 TABLET BY MOUTH TWICE A DAY 90 tablet 3   Multiple Vitamin (MULTIVITAMIN WITH MINERALS) TABS tablet Take 1 tablet by mouth daily.     Multiple Vitamins-Minerals (PRESERVISION AREDS 2) CAPS Take 1 capsule by mouth 2 (two) times daily.     nitroGLYCERIN (NITROSTAT) 0.4 MG SL tablet PLACE 1 TABLET UNDER THE TONGUE EVERY 5 MINUTES AS NEEDED FOR CHEST PAIN 25 tablet 0   Polyethylene Glycol 400 (BLINK TEARS OP) Place 1 drop into both eyes daily as needed (dry eyes).     valsartan (DIOVAN) 80 MG tablet Take 1 tablet (80 mg total) by mouth daily. 90 tablet 3   No current facility-administered medications on file prior to visit.    Allergies  Allergen Reactions   Ceclor [Cefaclor] Other (See Comments)    Lost vision in eye   Hctz [Hydrochlorothiazide] Other (See Comments)    Renal insufficiency   Lipitor [Atorvastatin Calcium] Other (See Comments)    Increased A1C   Norvasc [Amlodipine] Other (See Comments)    Makes patient stiff   Penicillins Hives, Shortness Of Breath, Itching and Rash   Sulfa Antibiotics Other (See Comments)    CAUSES SHOCK   Statins Other (See Comments)    MYALGIAS AND WEAKNESS   Milk-Related Compounds Diarrhea and Nausea And Vomiting   Erythromycin Rash   Latex Rash   Levofloxacin Rash   Tetracyclines & Related Rash    Assessment/Plan:  1. Hyperlipidemia -  No problems updated.  No problem-specific Assessment & Plan notes found for this encounter.    Thank you,  Carmela Hurt, Pharm.D [facility] A Division of [facility] [address] [place], [place] [address]  Phone: [phone]; Fax: [phone]

## 2023-06-04 NOTE — Patient Instructions (Addendum)
Changes made by your pharmacist Carmela Hurt, PharmD at today's visit:    Instructions/Changes  (what do you need to do) Your Notes  (what you did and when you did it)  Switch furosemide 20 mg from daily to when needed only for swelling   Start taking spironolactone 12.5 mg daily and continue taking Jardiance 10 mg daily Cardizem 120 mg daily, lopressor 50 twice daily, and valsartan 80 mg daily    Continue checking BP and bring log at the next office visit     Bring all of your meds, your BP cuff and your record of home blood pressures to your next appointment.    HOW TO TAKE YOUR BLOOD PRESSURE AT HOME  Rest 5 minutes before taking your blood pressure.  Don't smoke or drink caffeinated beverages for at least 30 minutes before. Take your blood pressure before (not after) you eat. Sit comfortably with your back supported and both feet on the floor (don't cross your legs). Elevate your arm to heart level on a table or a desk. Use the proper sized cuff. It should fit smoothly and snugly around your bare upper arm. There should be enough room to slip a fingertip under the cuff. The bottom edge of the cuff should be 1 inch above the crease of the elbow. Ideally, take 3 measurements at one sitting and record the average.  Important lifestyle changes to control high blood pressure  Intervention  Effect on the BP  Lose extra pounds and watch your waistline Weight loss is one of the most effective lifestyle changes for controlling blood pressure. If you're overweight or obese, losing even a small amount of weight can help reduce blood pressure. Blood pressure might go down by about 1 millimeter of mercury (mm Hg) with each kilogram (about 2.2 pounds) of weight lost.  Exercise regularly As a general goal, aim for at least 30 minutes of moderate physical activity every day. Regular physical activity can lower high blood pressure by about 5 to 8 mm Hg.  Eat a healthy diet Eating a diet rich in  whole grains, fruits, vegetables, and low-fat dairy products and low in saturated fat and cholesterol. A healthy diet can lower high blood pressure by up to 11 mm Hg.  Reduce salt (sodium) in your diet Even a small reduction of sodium in the diet can improve heart health and reduce high blood pressure by about 5 to 6 mm Hg.  Limit alcohol One drink equals 12 ounces of beer, 5 ounces of wine, or 1.5 ounces of 80-proof liquor.  Limiting alcohol to less than one drink a day for women or two drinks a day for men can help lower blood pressure by about 4 mm Hg.   If you have any questions or concerns please use My Chart to send questions or call the office at [phone] or [phone]

## 2023-06-05 ENCOUNTER — Other Ambulatory Visit: Payer: Self-pay

## 2023-06-05 ENCOUNTER — Encounter: Payer: Self-pay | Admitting: Student

## 2023-06-05 MED ORDER — APIXABAN 5 MG PO TABS: 5.0000 mg | ORAL_TABLET | Freq: Two times a day (BID) | ORAL | Status: DC

## 2023-06-05 NOTE — Telephone Encounter (Signed)
**Note De-Identified Stellar Gensel Obfuscation** Per Lucia Estelle, operator at [facility]: She'll be by the office today to pick up Eliquis she stated and told me to let you know she thanks you.

## 2023-06-05 NOTE — Assessment & Plan Note (Signed)
Assessment and Plan:  Last echo 12/2022 showed EF 60-65%, in office BP 123/72 At last visit with PharmD Diovan 80 mg added. SBP before Diovan was in 150 range. BMP was WNL post Diovan initiation Denies SOB,palpitation, swelling, chest pain or dizziness  Reports her home BP ~ 120-130/ 64  On diuretics, SGLt2i, ARB, CCB and beta-blocker  SGLT2i - on patient assistance  Will optimize therapy by adding MRA low dose and change diuretics (Lasix 20-40 mg)  from daily dose to PRN follow up BMP in 2-3 weeks ( last BMP 05/25/2023 Crcl - 47 mL/min) Follow up with PharmD in 4 weeks

## 2023-06-05 NOTE — Telephone Encounter (Signed)
**Note De-Identified Samantha Short Obfuscation** I called the pt but got no answe and her Voice mailbax is full so I could not leave a message.  I have placed 2 sample boxes (28 tablets) of Eliquis 5 mg in the front office for the pt. I am forwarding this note to Dr Gibson Ramp nurse as Lorain Childes incase the pt calls back and I am unavailable.

## 2023-06-12 ENCOUNTER — Telehealth: Payer: Self-pay | Admitting: Student

## 2023-06-12 NOTE — Telephone Encounter (Signed)
Spoke to patient, suppose to get BMP, FLP and LFTs done - spironolactone 12.5 mg started 2 weeks ago, and Zetia started in Aug 2024.

## 2023-06-12 NOTE — Telephone Encounter (Signed)
New Message:     Please call,questions about her lab orders.

## 2023-06-13 DIAGNOSIS — I10 Essential (primary) hypertension: Secondary | ICD-10-CM | POA: Diagnosis not present

## 2023-06-13 DIAGNOSIS — E785 Hyperlipidemia, unspecified: Secondary | ICD-10-CM | POA: Diagnosis not present

## 2023-06-13 DIAGNOSIS — I5032 Chronic diastolic (congestive) heart failure: Secondary | ICD-10-CM | POA: Diagnosis not present

## 2023-06-14 ENCOUNTER — Telehealth: Payer: Self-pay | Admitting: Pharmacist

## 2023-06-14 ENCOUNTER — Telehealth: Payer: Self-pay | Admitting: *Deleted

## 2023-06-14 DIAGNOSIS — Z950 Presence of cardiac pacemaker: Secondary | ICD-10-CM | POA: Diagnosis not present

## 2023-06-14 DIAGNOSIS — K668 Other specified disorders of peritoneum: Secondary | ICD-10-CM | POA: Diagnosis not present

## 2023-06-14 DIAGNOSIS — Z862 Personal history of diseases of the blood and blood-forming organs and certain disorders involving the immune mechanism: Secondary | ICD-10-CM | POA: Diagnosis not present

## 2023-06-14 DIAGNOSIS — Z800 Family history of malignant neoplasm of digestive organs: Secondary | ICD-10-CM | POA: Diagnosis not present

## 2023-06-14 DIAGNOSIS — K219 Gastro-esophageal reflux disease without esophagitis: Secondary | ICD-10-CM | POA: Diagnosis not present

## 2023-06-14 DIAGNOSIS — Z8679 Personal history of other diseases of the circulatory system: Secondary | ICD-10-CM | POA: Diagnosis not present

## 2023-06-14 DIAGNOSIS — I5032 Chronic diastolic (congestive) heart failure: Secondary | ICD-10-CM

## 2023-06-14 LAB — BASIC METABOLIC PANEL
BUN/Creatinine Ratio: 13 (ref 12–28)
BUN: 17 mg/dL (ref 8–27)
CO2: 24 mmol/L (ref 20–29)
Calcium: 9.5 mg/dL (ref 8.7–10.3)
Chloride: 107 mmol/L — ABNORMAL HIGH (ref 96–106)
Creatinine, Ser: 1.32 mg/dL — ABNORMAL HIGH (ref 0.57–1.00)
Glucose: 81 mg/dL (ref 70–99)
Potassium: 5.8 mmol/L (ref 3.5–5.2)
Sodium: 140 mmol/L (ref 134–144)
eGFR: 42 mL/min/{1.73_m2} — ABNORMAL LOW (ref >59)

## 2023-06-14 LAB — LIPID PANEL
Chol/HDL Ratio: 2.2 {ratio} (ref 0.0–4.4)
Cholesterol, Total: 163 mg/dL (ref 100–199)
HDL: 73 mg/dL (ref >39)
LDL Chol Calc (NIH): 79 mg/dL (ref 0–99)
Triglycerides: 54 mg/dL (ref 0–149)
VLDL Cholesterol Cal: 11 mg/dL (ref 5–40)

## 2023-06-14 LAB — HEPATIC FUNCTION PANEL
ALT: 27 [IU]/L (ref 0–32)
AST: 27 [IU]/L (ref 0–40)
Albumin: 4.1 g/dL (ref 3.8–4.8)
Alkaline Phosphatase: 86 [IU]/L (ref 44–121)
Bilirubin Total: 0.4 mg/dL (ref 0.0–1.2)
Bilirubin, Direct: 0.15 mg/dL (ref 0.00–0.40)
Total Protein: 6.8 g/dL (ref 6.0–8.5)

## 2023-06-14 NOTE — Telephone Encounter (Signed)
Spoke to patient, advised to stop spironolactone due to hyperkalemia and resume furosemide 20 mg daily. LDLc still above goal will discuss next options at next OV visit on 10/23.

## 2023-06-14 NOTE — Telephone Encounter (Signed)
I s/w the pt and she has been scheduled for in office appt 06/22/23 with Joni Reining, DNP at the [facility] office, as the 1st pre op appt at [facility] was not until 07/04/23. Pt asked for the sooner appt. I will update all parties involved. Pt is aware the appt is at [facility] office.

## 2023-06-14 NOTE — Telephone Encounter (Signed)
Pharmacy please advise on holding Eliquis prior to Colonoscopy/Endoscopy scheduled for 07/19/2023. Thank you.

## 2023-06-14 NOTE — Telephone Encounter (Signed)
Pre-operative Risk Assessment    Patient Name: Samantha Short  DOB: [birth year] MRN: [id number]    DATE OF LAST VISIT: 05/25/23 Ronie Spies, PAC DATE OF NEXT VISIT: 08/22/23 Ronie Spies, [facility]  Request for Surgical Clearance    Procedure:   COLONOSCOPY/ENDOSCOPY ; Fx Hx COLON CANCER, OMENTAL MASS  Date of Surgery:  Clearance 07/19/23                                 Surgeon:  DR. Levora Angel Surgeon's Group or Practice Name:  [facility] Phone number:  [phone] Fax number:  [phone]   Type of Clearance Requested:   - Medical  - Pharmacy:  Hold Apixaban (Eliquis) x 2 DAYS PRIOR   Type of Anesthesia:   PROPOFOL   Additional requests/questions:    [name]   06/14/2023, 10:04 AM

## 2023-06-14 NOTE — Telephone Encounter (Signed)
Name: Samantha Short  DOB: 21-Mar-1946  MRN: [id number]  Primary Cardiologist: Verne Carrow, MD  Chart reviewed as part of pre-operative protocol coverage. Because of Samantha Short's past medical history and time since last visit, she will require a follow-up in-office visit in order to better assess preoperative cardiovascular risk. Not cleared due to issues with hyperkalemia and need for pharmacy input.  See note from Dayna Dunn,PA in response to my query.   - Please contact requesting surgeon's office via preferred method (i.e, phone, fax) to inform them of need for appointment prior to surgery.   Joni Reining, NP  06/14/2023, 3:09 PM

## 2023-06-14 NOTE — Telephone Encounter (Signed)
Samantha Short, the patient's chart was reviewed for preoperative cardiac evaluation.  She was seen by you on 05/25/2023 and according to protocol, we request that you comment on cardiac risk for upcoming procedure since office visit was less than 2 months ago. I am waiting on Pharm to weigh in on Eliquis also.    Please route your response to p cv div preop.  Thank you, Joni Reining NP

## 2023-06-15 ENCOUNTER — Telehealth: Payer: Self-pay

## 2023-06-15 ENCOUNTER — Ambulatory Visit (INDEPENDENT_AMBULATORY_CARE_PROVIDER_SITE_OTHER): Payer: Medicare HMO

## 2023-06-15 DIAGNOSIS — I441 Atrioventricular block, second degree: Secondary | ICD-10-CM

## 2023-06-15 NOTE — Telephone Encounter (Signed)
Form signed by Dr. Clifton James and has been sent to drug company.

## 2023-06-15 NOTE — Telephone Encounter (Signed)
Spoke with L[place] in V. Patel's absence today regarding Lasix/recheck K. She documented in other phone note from yesterday. Reasonable to keep plan for OV to re-eval before endoscopy given recent med adjustments.

## 2023-06-15 NOTE — Addendum Note (Signed)
Addended by: Malena Peer D on: 06/15/2023 11:08 AM   Modules accepted: Orders

## 2023-06-15 NOTE — Telephone Encounter (Addendum)
**Note De-Identified Clebert Wenger Obfuscation** The pts [employer] application was e-mailed to me on 9/6 but I misplaced it. I have located it and have completed the providers page of the application and have e-mailed all to Dr Gibson Ramp nurse with a request for her to have him sign/date it and to fax all to [employer] at the fax number written on the cover letter included.  I called the pt to apologize for the delay in getting her BMSPAF application faxed to them but got no answer and could not leave VM as her voice mailbox is full.

## 2023-06-15 NOTE — Telephone Encounter (Signed)
Pt aware and agrees with plan ./cy ?

## 2023-06-15 NOTE — Telephone Encounter (Signed)
I spoke with Samantha Short about concerns over this pt K and her lasix dose. Discussed case and I agree that pt needs to have her K checked prior to next appointment.  Patient needs to have a recheck of K on Monday 10/7 to make sure its coming down. I will place the lab order. She should resume her lasix at alternating 20mg  with 40mg  daily.  She needs to keep her appointment on Friday with Samantha Short.   Triage, can you please relay message to patient. Thank you.

## 2023-06-16 LAB — CUP PACEART REMOTE DEVICE CHECK
Battery Remaining Longevity: 131 mo
Battery Voltage: 3.01 V
Brady Statistic AP VP Percent: 5.25 %
Brady Statistic AP VS Percent: 60.93 %
Brady Statistic AS VP Percent: 3.28 %
Brady Statistic AS VS Percent: 30.54 %
Brady Statistic RA Percent Paced: 66.22 %
Brady Statistic RV Percent Paced: 8.53 %
Date Time Interrogation Session: 20241004042953
Implantable Lead Connection Status: 753985
Implantable Lead Connection Status: 753985
Implantable Lead Implant Date: 20210707
Implantable Lead Implant Date: 20210707
Implantable Lead Location: 753859
Implantable Lead Location: 753860
Implantable Lead Model: 5076
Implantable Lead Model: 5076
Implantable Lead Serial Number: 8264443
Implantable Pulse Generator Implant Date: 20210707
Lead Channel Impedance Value: 342 Ohm
Lead Channel Impedance Value: 380 Ohm
Lead Channel Impedance Value: 437 Ohm
Lead Channel Impedance Value: 532 Ohm
Lead Channel Pacing Threshold Amplitude: 0.75 V
Lead Channel Pacing Threshold Amplitude: 1.125 V
Lead Channel Pacing Threshold Pulse Width: 0.4 ms
Lead Channel Pacing Threshold Pulse Width: 0.4 ms
Lead Channel Sensing Intrinsic Amplitude: 3.25 mV
Lead Channel Sensing Intrinsic Amplitude: 3.25 mV
Lead Channel Sensing Intrinsic Amplitude: 7.875 mV
Lead Channel Sensing Intrinsic Amplitude: 7.875 mV
Lead Channel Setting Pacing Amplitude: 1.5 V
Lead Channel Setting Pacing Amplitude: 2.5 V
Lead Channel Setting Pacing Pulse Width: 0.4 ms
Lead Channel Setting Sensing Sensitivity: 2.8 mV
Zone Setting Status: 755011
Zone Setting Status: 755011

## 2023-06-18 ENCOUNTER — Encounter: Payer: Self-pay | Admitting: Gastroenterology

## 2023-06-18 ENCOUNTER — Telehealth: Payer: Self-pay | Admitting: *Deleted

## 2023-06-18 ENCOUNTER — Ambulatory Visit: Payer: Medicare HMO | Attending: Cardiovascular Disease

## 2023-06-18 DIAGNOSIS — I10 Essential (primary) hypertension: Secondary | ICD-10-CM | POA: Diagnosis not present

## 2023-06-18 DIAGNOSIS — E785 Hyperlipidemia, unspecified: Secondary | ICD-10-CM

## 2023-06-18 DIAGNOSIS — I5032 Chronic diastolic (congestive) heart failure: Secondary | ICD-10-CM | POA: Diagnosis not present

## 2023-06-18 MED ORDER — APIXABAN 5 MG PO TABS: 5.0000 mg | ORAL_TABLET | Freq: Two times a day (BID) | ORAL | Status: DC

## 2023-06-18 MED ORDER — FUROSEMIDE 20 MG PO TABS: 20.0000 mg | ORAL_TABLET | Freq: Every day | ORAL | 1 refills | Status: DC

## 2023-06-18 NOTE — Addendum Note (Signed)
Addended by: Margaret Pyle D on: 06/18/2023 11:53 AM   Modules accepted: Orders

## 2023-06-18 NOTE — Telephone Encounter (Signed)
Name: Samantha Short  DOB: 10-24-1945  MRN: [id number]  Primary Cardiologist: Verne Carrow, MD  Chart reviewed as part of pre-operative protocol coverage. The patient has an upcoming visit scheduled with Ronie Spies, PA  on 08/22/2023 and is seeing Pharmacy on 07/04/2023, at which time clearance can be addressed in case there are any issues that would impact surgical recommendations.  CHA2DS2-VASc Score = 6   This indicates a 9.7% annual risk of stroke. The patient's score is based upon: CHF History: 1 HTN History: 1 Diabetes History: 0 Stroke History: 0 Vascular Disease History: 1 Age Score: 2 Gender Score: 1       CrCl 38 ml/min   Per office protocol, patient can hold Eliquis for 2 days prior to procedure.    Colonoscopy/Endoscopy Is not scheduled until 07/19/2023 as below. I added preop FYI to appointment note so that provider is aware to address at time of outpatient visit.  Per office protocol the cardiology provider should forward their finalized clearance decision and recommendations regarding antiplatelet therapy to the requesting party below.    I will route this message as FYI to requesting party and remove this message from the preop box as separate preop APP input not needed at this time.   Please call with any questions.  Joni Reining, NP  06/18/2023, 10:14 AM

## 2023-06-18 NOTE — Telephone Encounter (Signed)
I will update all parties involved. Pt has appt 06/22/23 with Joni Reining, DNP.

## 2023-06-18 NOTE — Telephone Encounter (Signed)
Patient with diagnosis of afib on Eliquis for anticoagulation.    Procedure: COLONOSCOPY/ENDOSCOPY ; Fx Hx COLON CANCER, OMENTAL MASS  Date of procedure: 07/19/23   CHA2DS2-VASc Score = 6   This indicates a 9.7% annual risk of stroke. The patient's score is based upon: CHF History: 1 HTN History: 1 Diabetes History: 0 Stroke History: 0 Vascular Disease History: 1 Age Score: 2 Gender Score: 1      CrCl 38 ml/min  Per office protocol, patient can hold Eliquis for 2 days prior to procedure.    **This guidance is not considered finalized until pre-operative APP has relayed final recommendations.**

## 2023-06-18 NOTE — Telephone Encounter (Addendum)
-----   Message from Verne Carrow sent at 06/14/2023  7:34 AM EDT ----- See labs. Aldactone was recently added. Do you think we should hold this for now and repeat her BMET given the elevated potassium? This could all be hemolysis. Thanks, Thayer Ohm    spoke to patient, advised to stop spironolactone due to hyperkalemia and resume furosemide 20 mg daily. LDLc still above goal will discuss next options at next OV visit on 10/23.  Written by Tylene Fantasia, RPH on 06/14/2023 10:28 AM EDT ______________________________________________________________   Medication list updated/mw

## 2023-06-18 NOTE — Telephone Encounter (Signed)
Leaving pt 2 weeks of Eliquis 5 mg tablet samples at [facility] front desk for pt to pick up. FYI

## 2023-06-19 ENCOUNTER — Telehealth: Payer: Self-pay | Admitting: Cardiovascular Disease

## 2023-06-19 ENCOUNTER — Encounter: Payer: Self-pay | Admitting: *Deleted

## 2023-06-19 LAB — COMPREHENSIVE METABOLIC PANEL
ALT: 31 [IU]/L (ref 0–32)
AST: 33 [IU]/L (ref 0–40)
Albumin: 4.3 g/dL (ref 3.8–4.8)
Alkaline Phosphatase: 89 [IU]/L (ref 44–121)
BUN/Creatinine Ratio: 19 (ref 12–28)
BUN: 25 mg/dL (ref 8–27)
Bilirubin Total: 0.2 mg/dL (ref 0.0–1.2)
CO2: 23 mmol/L (ref 20–29)
Calcium: 9.6 mg/dL (ref 8.7–10.3)
Chloride: 106 mmol/L (ref 96–106)
Creatinine, Ser: 1.32 mg/dL — ABNORMAL HIGH (ref 0.57–1.00)
Globulin, Total: 2.6 g/dL (ref 1.5–4.5)
Glucose: 84 mg/dL (ref 70–99)
Potassium: 4.7 mmol/L (ref 3.5–5.2)
Sodium: 142 mmol/L (ref 134–144)
Total Protein: 6.9 g/dL (ref 6.0–8.5)
eGFR: 42 mL/min/{1.73_m2} — ABNORMAL LOW (ref >59)

## 2023-06-19 LAB — LIPID PANEL
Chol/HDL Ratio: 2.2 {ratio} (ref 0.0–4.4)
Cholesterol, Total: 168 mg/dL (ref 100–199)
HDL: 78 mg/dL (ref >39)
LDL Chol Calc (NIH): 78 mg/dL (ref 0–99)
Triglycerides: 61 mg/dL (ref 0–149)
VLDL Cholesterol Cal: 12 mg/dL (ref 5–40)

## 2023-06-19 MED ORDER — FUROSEMIDE 20 MG PO TABS: ORAL_TABLET | ORAL | 1 refills | Status: DC

## 2023-06-19 NOTE — Telephone Encounter (Addendum)
Additional changes were made on Friday 10/4 to address this in another phone note (origination date 10/3). Patient had been advised to stop spironolactone and go back to Lasix 40mg  alternating with 20mg  every other day as she had been doing. FYI only, no action needed in this note. I will route my comments on f/u plan in the result note from the labs drawn yesterday.

## 2023-06-19 NOTE — Telephone Encounter (Signed)
Patient is returning call. Unsure who called. Requesting call back.

## 2023-06-19 NOTE — Telephone Encounter (Signed)
-----   Message from Laurann Montana sent at 06/19/2023 10:30 AM EDT ----- Triage, please let patient know that her labs have returned to baseline off spironolactone. Stay off this, no more spironolactone. On Friday 10/7 our office called her to advise she go back to Lasix 40mg  alternating with 20mg  daily as she was on at recent OV (this was outlined in the 10/3 phone note - a little confusing because there are multiple phone notes open on her). Please confirm she did this and find out how she is feeling and how BPs are running. If she otherwise feels well without any acute complaints, we can cancel the Friday appointment with Joni Reining and I will send an update to GI that she is cleared from my perspective for their procedures. If she has any new concerns she'd like to discuss, we can keep this OV instead to discuss clearing. Just let me know what she decides.  Looks like lab drew lipid profile a little too early but it's still been 7 weeks since Zetia initiation. Will cc to [facility] who has pharm f/u 07/04/23, can keep this appointment to discuss next steps for lipid management since LDL still above goal.

## 2023-06-19 NOTE — Telephone Encounter (Signed)
I spoke w the patient.  She is feeling overall okay.  No swelling, no new or worsening SOB.  BPs on average are 125/70s.  Will continue to monitor these.  She feels okay to cancel the appointment for pre op on 10/11 so I have done so.  No new concerns.    Her lab work from [facility] has been scanned into her chart.  She said her GI procedures have been scheduled.

## 2023-06-20 NOTE — Telephone Encounter (Signed)
I will go back to original clearance note and route finalized clearance to GI.

## 2023-06-20 NOTE — Telephone Encounter (Signed)
Patient Name: Samantha Short  DOB: [birth year] MRN: [id number]  Primary Cardiologist: Verne Carrow, MD  Chart reviewed as part of pre-operative protocol coverage. Her hyperkalemia has resolved back on previous regimen of Lasix 40mg  alternating with 20mg  every other day and discontinuation of spironolactone. In a phone message yesterday she denied any new concerns and reported home BPs are well controlled. Therefore, based on ACC/AHA guidelines, Dashley R Prante would be at acceptable risk for the planned procedure without further cardiovascular testing.   Per office protocol, patient can hold Eliquis for 2 days prior to procedure.    Will route this bundled recommendation to requesting provider via Epic fax function. Please call with questions.   Laurann Montana, PA-C 06/20/2023, 7:57 AM

## 2023-06-21 ENCOUNTER — Telehealth: Payer: Self-pay

## 2023-06-21 NOTE — Telephone Encounter (Signed)
Spoke with patient via daughters phone. Made aware of 10AM appointment and will be present. [employer] rep to be present as well.

## 2023-06-21 NOTE — Telephone Encounter (Signed)
Pt is asking is her pacing increasing? She was taken off her blood pressure medication and wants to know if that has anything to do with the results of the reading?

## 2023-06-21 NOTE — Telephone Encounter (Signed)
Spoke w. Pt. Symptomatic w. Increase in RV pacing. Pt agreeable to 10/11 device clinic apt w/ industry to troubleshoot. Messaged SK before making apt for adjustments.

## 2023-06-22 ENCOUNTER — Ambulatory Visit: Payer: Medicare HMO | Attending: Cardiology

## 2023-06-22 ENCOUNTER — Ambulatory Visit: Payer: Medicare HMO | Admitting: Hematology

## 2023-06-22 ENCOUNTER — Ambulatory Visit: Payer: Medicare HMO | Admitting: Adult Health

## 2023-06-22 DIAGNOSIS — Z950 Presence of cardiac pacemaker: Secondary | ICD-10-CM

## 2023-06-22 LAB — CUP PACEART INCLINIC DEVICE CHECK
Date Time Interrogation Session: 20241011125803
Implantable Lead Connection Status: 753985
Implantable Lead Connection Status: 753985
Implantable Lead Implant Date: 20210707
Implantable Lead Implant Date: 20210707
Implantable Lead Location: 753859
Implantable Lead Location: 753860
Implantable Lead Model: 5076
Implantable Lead Model: 5076
Implantable Lead Serial Number: 8264443
Implantable Pulse Generator Implant Date: 20210707

## 2023-06-22 NOTE — Progress Notes (Signed)
Pt brought in for increase in RV pacing drastically up to 100% x several weeks accompanied by symptoms. Symptoms include chest discomfort, feeling heart pounding in her throat, and headache. Pt RV pacing 10% on presentation. Intrinsic A-V @ rest ~320ms. Intrinsic A-V w/ low-moderate exertion ~220-261ms. Peak HR 78 during a brisk walk of about 300 ft. Del, [employer] rep, present for testing today. Paced AVD pulled out to and Sensed AVD pulled out to . MVP is already programmed on. Pt states she swims laps every day. Recently patient has only been able to do 2 laps down from a previous of 10 and 25 not so recently. She attributes some of this some oif the medications she is on. Del and I are in agreement due to the complicated clinical picture this pt would benefit from seeing Dr. Graciela Husbands sooner rather than later. Appointment scheduled for October 21 in [place].

## 2023-06-23 DIAGNOSIS — G4733 Obstructive sleep apnea (adult) (pediatric): Secondary | ICD-10-CM | POA: Diagnosis not present

## 2023-06-25 DIAGNOSIS — E538 Deficiency of other specified B group vitamins: Secondary | ICD-10-CM | POA: Diagnosis not present

## 2023-06-25 DIAGNOSIS — Z23 Encounter for immunization: Secondary | ICD-10-CM | POA: Diagnosis not present

## 2023-06-27 NOTE — Progress Notes (Signed)
Remote pacemaker transmission.   

## 2023-06-28 ENCOUNTER — Encounter: Payer: Self-pay | Admitting: *Deleted

## 2023-07-02 ENCOUNTER — Other Ambulatory Visit: Payer: Self-pay

## 2023-07-02 ENCOUNTER — Ambulatory Visit: Payer: Medicare HMO | Attending: Internal Medicine | Admitting: Internal Medicine

## 2023-07-02 VITALS — BP 128/88 | HR 60 | Ht 62.0 in | Wt 207.8 lb

## 2023-07-02 DIAGNOSIS — K6389 Other specified diseases of intestine: Secondary | ICD-10-CM

## 2023-07-02 DIAGNOSIS — I480 Paroxysmal atrial fibrillation: Secondary | ICD-10-CM | POA: Diagnosis not present

## 2023-07-02 LAB — CUP PACEART INCLINIC DEVICE CHECK
Battery Remaining Longevity: 131 mo
Battery Voltage: 3.02 V
Brady Statistic AP VP Percent: 0.22 %
Brady Statistic AP VS Percent: 69.53 %
Brady Statistic AS VP Percent: 3.89 %
Brady Statistic AS VS Percent: 26.36 %
Brady Statistic RA Percent Paced: 69.8 %
Brady Statistic RV Percent Paced: 4.11 %
Date Time Interrogation Session: 20241021113242
Implantable Lead Connection Status: 753985
Implantable Lead Connection Status: 753985
Implantable Lead Implant Date: 20210707
Implantable Lead Implant Date: 20210707
Implantable Lead Location: 753859
Implantable Lead Location: 753860
Implantable Lead Model: 5076
Implantable Lead Model: 5076
Implantable Lead Serial Number: 8264443
Implantable Pulse Generator Implant Date: 20210707
Lead Channel Impedance Value: 380 Ohm
Lead Channel Impedance Value: 418 Ohm
Lead Channel Impedance Value: 475 Ohm
Lead Channel Impedance Value: 627 Ohm
Lead Channel Pacing Threshold Amplitude: 0.75 V
Lead Channel Pacing Threshold Amplitude: 0.875 V
Lead Channel Pacing Threshold Amplitude: 1.25 V
Lead Channel Pacing Threshold Pulse Width: 0.4 ms
Lead Channel Pacing Threshold Pulse Width: 0.4 ms
Lead Channel Pacing Threshold Pulse Width: 0.4 ms
Lead Channel Sensing Intrinsic Amplitude: 2.75 mV
Lead Channel Sensing Intrinsic Amplitude: 4 mV
Lead Channel Sensing Intrinsic Amplitude: 6.125 mV
Lead Channel Sensing Intrinsic Amplitude: 7.625 mV
Lead Channel Setting Pacing Amplitude: 1.5 V
Lead Channel Setting Pacing Amplitude: 2.5 V
Lead Channel Setting Pacing Pulse Width: 0.4 ms
Lead Channel Setting Sensing Sensitivity: 2.8 mV
Zone Setting Status: 755011
Zone Setting Status: 755011

## 2023-07-02 NOTE — Progress Notes (Signed)
Patient Care Team: Mila Palmer, MD as PCP - General (Family Medicine) Kathleene Hazel, MD as PCP - Cardiology (Cardiology) Duke Salvia, MD as PCP - Electrophysiology (Cardiology) Johney Maine, MD as Consulting Physician (Hematology)   HPI  Samantha Short is a 77 y.o. female seen in followup for pacemaker Med[employer] implanted 7/21 for symptomatic second-degree heart block and intermittent complete heart block.  Procedure was complicated by microperforation requiring lead revision.  Coronary artery disease with catheterization 2010 demonstrating LAD proximal stenosis 80% for which she underwent LIMA-to the LAD  6/24 hospitalized with Afib RVR manifesting with chest pain; spontaneous conversion and then reversion.  Amio initiated.    Cleda Daub added and stopped 2/2 hyperkalemia  10/20 for complaints of increasing exercise intolerance (swimming laps down from 10-25--2) associated with 100% ventricular pacing accompanied by palpitations and chest discomfort.  A mesenteric calcified mass was noted incidentally; laparoscopic biopsies apparently are negative.  Oncology is to be seen tomorrow.  She relates the incredible stress of living at home with her daughter and her autistic adopted grandson who is nonverbal and violent      DATE TEST EF   7/21 Echo and  65% %   7/21 cMRI  79 catheter % No amyloid  7/21 LHC  LIMA-LAD patent but small LADp20% Ao Stenosis mean Grad   8/23 Echo 65-70% AoStenosis  Mean Grad 23  4/24 Echo  60-65% Ao Stenosis Mean Grad 17     Date Cr K Hgb TSH LFTs  7/22 1.35 4.2 13.8 8.4   8/23    3.98    10/24 1.32 4.7<<5.8 13.3   33   Thromboembolic risk factors ( age  -2, HTN-1, Vasc disease -1, Gender-1) for a CHADSVASc Score of >=5    Records and Results Reviewed   Past Medical History:  Diagnosis Date   Anemia    pernicious   Anxiety    Arthritis    Asthma    Chronic diastolic CHF (congestive heart failure) (HCC)    a.  02/2011 Echo: EF 55-60%, Gr2 DD, Mild MR   Chronic kidney disease    stage 3   Complication of anesthesia    Coronary artery disease    a. s/p CABG x 1 2010:  LIMA->LAD.;  b. amdx for CP => LHC 07/04/12: LAD 70-80%, mid RCA 30%, LIMA-LAD patent with competitive flow limiting distal LAD filling, EF 70% with hyperdynamic LV function. Medical therapy continued.   Depression    Dyslipidemia    GERD (gastroesophageal reflux disease)    Headache    hx of migraines   Heart murmur    History of hiatal hernia    History of kidney stones    Hyperlipidemia    Hypertension    Hyperthyroidism    Hypothyroidism    Mild aortic stenosis    Mitral regurgitation    a. mild by echo 02/2011.   PAF (paroxysmal atrial fibrillation) (HCC)    Pneumonia    PONV (postoperative nausea and vomiting)    Pre-diabetes    Presence of permanent cardiac pacemaker    Sleep apnea    diagnosed 08-2022    Past Surgical History:  Procedure Laterality Date   ABDOMINAL HYSTERECTOMY  1980   CARDIAC CATHETERIZATION  07/27/09 & 07/28/09   CESAREAN SECTION     x 2   CORONARY ARTERY BYPASS GRAFT  08/03/2009   x1 using left internal mammary artery to distal left anterior  descending coronary  artery.    GALLBLADDER SURGERY  2001   LAPAROSCOPY N/A 03/19/2023   Procedure: LAPAROSCOPY DIAGNOSTIC;  Surgeon: Fritzi Mandes, MD;  Location: [facility] OR;  Service: General;  Laterality: N/A;   LEFT HEART CATHETERIZATION WITH CORONARY/GRAFT ANGIOGRAM N/A 07/05/2012   Procedure: LEFT HEART CATHETERIZATION WITH Isabel Caprice;  Surgeon: Wendall Stade, MD;  Location: [facility] CATH LAB;  Service: Cardiovascular;  Laterality: N/A;   MASS EXCISION N/A 03/19/2023   Procedure: BIOPSY OF MESENTERIC MASS;  Surgeon: Fritzi Mandes, MD;  Location: [facility] OR;  Service: General;  Laterality: N/A;   PACEMAKER IMPLANT N/A 03/17/2020   Procedure: PACEMAKER IMPLANT;  Surgeon: Duke Salvia, MD;  Location: [facility] INVASIVE CV LAB;  Service: Cardiovascular;   Laterality: N/A;   RIGHT/LEFT HEART CATH AND CORONARY/GRAFT ANGIOGRAPHY N/A 03/17/2020   Procedure: RIGHT/LEFT HEART CATH AND CORONARY/GRAFT ANGIOGRAPHY;  Surgeon: Iran Ouch, MD;  Location: [facility] INVASIVE CV LAB;  Service: Cardiovascular;  Laterality: N/A;   SHOULDER SURGERY  1998 / 2001   from accident   rotator cuff   TOTAL HIP ARTHROPLASTY Right 08/30/2022   Procedure: TOTAL HIP ARTHROPLASTY ANTERIOR APPROACH;  Surgeon: Ollen Gross, MD;  Location: [facility] ORS;  Service: Orthopedics;  Laterality: Right;   VESICOVAGINAL FISTULA CLOSURE W/ TAH  1998    Current Meds  Medication Sig   acetaminophen (TYLENOL) 650 MG CR tablet Take 650 mg by mouth every 8 (eight) hours as needed for pain.   apixaban (ELIQUIS) 5 MG TABS tablet Take 1 tablet (5 mg total) by mouth 2 (two) times daily.   cholecalciferol (VITAMIN D3) 25 MCG (1000 UNIT) tablet Take 1,000 Units by mouth daily.   cyanocobalamin (,VITAMIN B-12,) 1000 MCG/ML injection Inject 1,000 mcg into the muscle every 30 (thirty) days.   diltiazem (CARDIZEM CD) 120 MG 24 hr capsule TAKE 1 CAPSULE BY MOUTH EVERY DAY   empagliflozin (JARDIANCE) 10 MG TABS tablet Take 1 tablet (10 mg total) by mouth daily before breakfast.   ezetimibe (ZETIA) 10 MG tablet Take 1 tablet (10 mg total) by mouth daily.   famotidine (PEPCID) 20 MG tablet Take 20 mg by mouth daily as needed for heartburn or indigestion.   furosemide (LASIX) 20 MG tablet Take 20 mg daily alternating with 40 mg every other day   levothyroxine (SYNTHROID) 88 MCG tablet Take 88 mcg by mouth daily before breakfast.   loratadine (CLARITIN) 10 MG tablet Take 10 mg by mouth daily.   metoprolol tartrate (LOPRESSOR) 50 MG tablet TAKE 1 TABLET BY MOUTH TWICE A DAY   Multiple Vitamin (MULTIVITAMIN WITH MINERALS) TABS tablet Take 1 tablet by mouth daily.   Multiple Vitamins-Minerals (PRESERVISION AREDS 2) CAPS Take 1 capsule by mouth 2 (two) times daily.   nitroGLYCERIN (NITROSTAT) 0.4 MG SL tablet  PLACE 1 TABLET UNDER THE TONGUE EVERY 5 MINUTES AS NEEDED FOR CHEST PAIN   Polyethylene Glycol 400 (BLINK TEARS OP) Place 1 drop into both eyes daily as needed (dry eyes).   valsartan (DIOVAN) 80 MG tablet Take 1 tablet (80 mg total) by mouth daily.   [DISCONTINUED] amiodarone (PACERONE) 200 MG tablet Take 1 tablet (200 mg total) by mouth daily.    Allergies  Allergen Reactions   Ceclor [Cefaclor] Other (See Comments)    Lost vision in eye   Hctz [Hydrochlorothiazide] Other (See Comments)    Renal insufficiency   Lipitor [Atorvastatin Calcium] Other (See Comments)    Increased A1C   Norvasc [Amlodipine] Other (See Comments)  Makes patient stiff   Penicillins Hives, Shortness Of Breath, Itching and Rash   Sulfa Antibiotics Other (See Comments)    CAUSES SHOCK   Statins Other (See Comments)    MYALGIAS AND WEAKNESS   Milk-Related Compounds Diarrhea and Nausea And Vomiting   Erythromycin Rash   Latex Rash   Levofloxacin Rash   Tetracyclines & Related Rash      Review of Systems negative except from HPI and PMH  Physical Exam  BP 128/88 (BP Location: Left Arm, Patient Position: Sitting, Cuff Size: Normal)   Pulse 60   Ht 5\' 2"  (1.575 m)   Wt 207 lb 12.8 oz (94.3 kg)   SpO2 96%   BMI 38.01 kg/m  Well developed and well nourished in no acute distress HENT normal Neck supple with JVP-flat Clear Device pocket well healed; without hematoma or erythema.  There is no tethering  Regular rate and rhythm, no  gallop 3/6 murmur Abd-soft with active BS No Clubbing cyanosis  edema Skin-warm and dry A & Oriented  Grossly normal sensory and motor function  ECG sinus rhythm with intrinsic conduction Intervals 25/13/46 Right bundle branch block  Device function is normal. Programming changes AV delays were lengthened; rate response was activated See Paceart for details   Initial programming resulted in a low threshold, slope's of 3 and 3, and ADL rate/exercise rate of 96/295.   This was quite symptomatic and the threshold was changed to medium high, slopes 2/2 and rates 90/115 with significant improvement with walking   Estimated Creatinine Clearance: 38.8 mL/min (A) (by C-G formula based on SCr of 1.32 mg/dL (H)).   Assessment and  Plan Heart block-second-degree intermittent   Sinus bradycardia-iatrogenic  Chest pain with ventricular pacing   Dyspnea on exertion    Coronary artery disease-single-vessel CABG  Granulomatous lung disease  Obesity   Aortic stenosis-mild   Renal insufficiency    Atrial fibrillation persistent  Psychosocial stress   Intermittent heart block has been associate with periodic ventricular pacing which is complicated by significant pain.  Interestingly, when she was pacing with pseudofusion she had no pain suggesting that the pain itself is a consequence of ventricular contraction as opposed to the paced impulse.  This is something I have seen before although infrequently.  To mitigate this we have reprogrammed AV delays to lengthen them  She also had iatrogenic bradycardia coincidental with initiation of amiodarone.  We have discontinued it.  This likely is also contributed to the ventricular pacing burden by slowing AV nodal conduction.  We have activated rate response.  In the that she has recurrent atrial fibrillation which despite rate control has significant symptoms we will need to consider alternative antiarrhythmic strategies.  Ventricular pacing with her current configuration would be unacceptable therefore AV junction ablation is not an alternative.  As a relates to the ventricular pacing pain, given her conduction system disease, there may be need to revise her pacing system either by adding a lead pursuing conduction system pacing or by extracting and then adding.  No bleeding.  Continue the Eliquis.   PVCs are quiescient inferred from histogram data  I spent today reviewing her records including  previous charts and labs, taking her history and face-to-face counseling,  adjusting the device , and documentation

## 2023-07-02 NOTE — Patient Instructions (Signed)
Medication Instructions:  STOP Amiodaraone  IF you have recurrent atrial fibrillation episode, you can increase your Metoprolol Tartrate (Lopressor) from twice daily to three times daily.   *If you need a refill on your cardiac medications before your next appointment, please call your pharmacy*   Follow-Up: At [facility], you and your health needs are our priority.  As part of our continuing mission to provide you with exceptional heart care, we have created designated Provider Care Teams.  These Care Teams include your primary Cardiologist (physician) and Advanced Practice Providers (APPs -  Physician Assistants and Nurse Practitioners) who all work together to provide you with the care you need, when you need it.  We recommend signing up for the patient portal called "MyChart".  Sign up information is provided on this After Visit Summary.  MyChart is used to connect with patients for Virtual Visits (Telemedicine).  Patients are able to view lab/test results, encounter notes, upcoming appointments, etc.  Non-urgent messages can be sent to your provider as well.   To learn more about what you can do with MyChart, go to [email or web address].    Your next appointment:   4 month(s)  Provider:   Sherryl Manges, MD

## 2023-07-03 ENCOUNTER — Inpatient Hospital Stay: Payer: Medicare HMO | Attending: Hematology | Admitting: Hematology

## 2023-07-03 VITALS — BP 129/59 | HR 69 | Temp 97.7°F | Resp 18 | Wt 211.3 lb

## 2023-07-03 DIAGNOSIS — K6389 Other specified diseases of intestine: Secondary | ICD-10-CM | POA: Diagnosis not present

## 2023-07-03 DIAGNOSIS — R222 Localized swelling, mass and lump, trunk: Secondary | ICD-10-CM | POA: Insufficient documentation

## 2023-07-03 NOTE — Progress Notes (Signed)
HEMATOLOGY/ONCOLOGY CLINIC NOTE  Date of Service: 07/03/2023  Patient Care Team: Mila Palmer, MD as PCP - General (Family Medicine) Kathleene Hazel, MD as PCP - Cardiology (Cardiology) Duke Salvia, MD as PCP - Electrophysiology (Cardiology) Johney Maine, MD as Consulting Physician (Hematology)  CHIEF COMPLAINTS/PURPOSE OF CONSULTATION:  Mesenteric mass  HISTORY OF PRESENTING ILLNESS: 01/10/2023 Samantha Short 77 y.o. female with medical history significant for CAD/CABG x1 2021, second-degree heart block sp PPM 2021, atrial fibrillation on anticoagulants, OSA on BiPAP, HLD, HTN, GERD, CKD stage III, hypothyroidism, anxiety and depression.  She presents to the diagnostic clinic for evaluation of a mesenteric mass seen on recent CT imaging.    On review of the previous records, Samantha Short was admitted from 01/02/2023-01/05/2023 due to chest pain, hypertensive urgency and rapid atrial fibrillation. CT CAP angiogram was obtained and there was an incidental finding of a partially calcified central mesenteric mass.    On exam today, Samantha Short reports that that she is not experiencing any abdominal pain. Several months ago, she did have some epigastric discomfort that last a couple of weeks and then resolved on its own. She reports her energy levels are fairly stable. She denies any appetite changes or weight loss. She does have some indigestion due to increased intake of fruits and vegetables. She denies nausea or vomiting. She does have constipation which is managed with colace and laxatives. She denies easy bruising or signs of active bleeding. She does have palpitations secondary to atrial fibrillation. She denies fevers, chills, sweats, shortness of breath, chest pain, flushing or cough. She has no other complaints.   INTERVAL HISTORY: Samantha Short is a wonderful 77 y.o. female who is here for continued evaluation and management of Mesenteric mass. Patient  initially saw PA Thayil on 01/10/2023 as stated in HPI above. Patient was suppose to follow-up with PA Thayil on 01/29/2023, but she canceled her appointment.   Patient notes she has been doing well overall since her last visit with PA Thayil. She has colonoscopy and endoscopy is scheduled for next month.  She denies any new infection issues, fever, chills, night sweats, abdominal pain, chest pain, back pain, or leg swelling.   MEDICAL HISTORY:  Past Medical History:  Diagnosis Date   Anemia    pernicious   Anxiety    Arthritis    Asthma    Chronic diastolic CHF (congestive heart failure) (HCC)    a. 02/2011 Echo: EF 55-60%, Gr2 DD, Mild MR   Chronic kidney disease    stage 3   Complication of anesthesia    Coronary artery disease    a. s/p CABG x 1 2010:  LIMA->LAD.;  b. amdx for CP => LHC 07/04/12: LAD 70-80%, mid RCA 30%, LIMA-LAD patent with competitive flow limiting distal LAD filling, EF 70% with hyperdynamic LV function. Medical therapy continued.   Depression    Dyslipidemia    GERD (gastroesophageal reflux disease)    Headache    hx of migraines   Heart murmur    History of hiatal hernia    History of kidney stones    Hyperlipidemia    Hypertension    Hyperthyroidism    Hypothyroidism    Mild aortic stenosis    Mitral regurgitation    a. mild by echo 02/2011.   PAF (paroxysmal atrial fibrillation) (HCC)    Pneumonia    PONV (postoperative nausea and vomiting)    Pre-diabetes    Presence of permanent cardiac  pacemaker    Sleep apnea    diagnosed 08-2022    SURGICAL HISTORY: Past Surgical History:  Procedure Laterality Date   ABDOMINAL HYSTERECTOMY  1980   CARDIAC CATHETERIZATION  07/27/09 & 07/28/09   CESAREAN SECTION     x 2   CORONARY ARTERY BYPASS GRAFT  08/03/2009   x1 using left internal mammary artery to distal left anterior  descending coronary artery.    GALLBLADDER SURGERY  2001   LAPAROSCOPY N/A 03/19/2023   Procedure: LAPAROSCOPY DIAGNOSTIC;   Surgeon: Fritzi Mandes, MD;  Location: [facility] OR;  Service: General;  Laterality: N/A;   LEFT HEART CATHETERIZATION WITH CORONARY/GRAFT ANGIOGRAM N/A 07/05/2012   Procedure: LEFT HEART CATHETERIZATION WITH Isabel Caprice;  Surgeon: Wendall Stade, MD;  Location: [facility] CATH LAB;  Service: Cardiovascular;  Laterality: N/A;   MASS EXCISION N/A 03/19/2023   Procedure: BIOPSY OF MESENTERIC MASS;  Surgeon: Fritzi Mandes, MD;  Location: [facility] OR;  Service: General;  Laterality: N/A;   PACEMAKER IMPLANT N/A 03/17/2020   Procedure: PACEMAKER IMPLANT;  Surgeon: Duke Salvia, MD;  Location: [facility] INVASIVE CV LAB;  Service: Cardiovascular;  Laterality: N/A;   RIGHT/LEFT HEART CATH AND CORONARY/GRAFT ANGIOGRAPHY N/A 03/17/2020   Procedure: RIGHT/LEFT HEART CATH AND CORONARY/GRAFT ANGIOGRAPHY;  Surgeon: Iran Ouch, MD;  Location: [facility] INVASIVE CV LAB;  Service: Cardiovascular;  Laterality: N/A;   SHOULDER SURGERY  1998 / 2001   from accident   rotator cuff   TOTAL HIP ARTHROPLASTY Right 08/30/2022   Procedure: TOTAL HIP ARTHROPLASTY ANTERIOR APPROACH;  Surgeon: Ollen Gross, MD;  Location: [facility] ORS;  Service: Orthopedics;  Laterality: Right;   VESICOVAGINAL FISTULA CLOSURE W/ TAH  1998    SOCIAL HISTORY: Social History   Socioeconomic History   Marital status: Widowed    Spouse name: Not on file   Number of children: 3   Years of education: Not on file   Highest education level: Not on file  Occupational History   Occupation: Retired- Engineer, site  Tobacco Use   Smoking status: Former    Current packs/day: 0.00    Types: Cigarettes    Start date: 07/13/2003    Quit date: 07/12/2010    Years since quitting: 12.9   Smokeless tobacco: Never   Tobacco comments:    Never a heavy smoker 2 a day  Vaping Use   Vaping status: Never Used  Substance and Sexual Activity   Alcohol use: No   Drug use: No   Sexual activity: Not Currently  Other Topics Concern   Not on file  Social History  Narrative   Not on file   Social Determinants of Health   Financial Resource Strain: Not on file  Food Insecurity: No Food Insecurity (01/03/2023)   Hunger Vital Sign    Worried About Running Out of Food in the Last Year: Never true    Ran Out of Food in the Last Year: Never true  Transportation Needs: No Transportation Needs (01/03/2023)   PRAPARE - Administrator, Civil Service (Medical): No    Lack of Transportation (Non-Medical): No  Physical Activity: Not on file  Stress: Not on file  Social Connections: Unknown (01/19/2022)   Received from [facility]   Social Network    Social Network: Not on file  Intimate Partner Violence: Not At Risk (01/03/2023)   Humiliation, Afraid, Rape, and Kick questionnaire    Fear of Current or Ex-Partner: No    Emotionally Abused:  No    Physically Abused: No    Sexually Abused: No    FAMILY HISTORY: Family History  Problem Relation Age of Onset   Heart attack Mother    Heart disease Mother        had pacemaker   High blood pressure Mother    High Cholesterol Mother    Obesity Mother    Cancer Father    Heart attack Father    High blood pressure Father    High Cholesterol Father    Heart disease Father    Alcoholism Father    Obesity Father    Heart disease Sister    Lung cancer Sister        metastases to brain   Lung cancer Sister    Breast cancer Sister    Cancer Brother        pituitary cancer   Heart disease Brother    Hypertension Brother    Coronary artery disease Son    Thyroid disease Neg Hx     ALLERGIES:  is allergic to ceclor [cefaclor], hctz [hydrochlorothiazide], lipitor [atorvastatin calcium], norvasc [amlodipine], penicillins, sulfa antibiotics, statins, milk-related compounds, erythromycin, latex, levofloxacin, and tetracyclines & related.  MEDICATIONS:  Current Outpatient Medications  Medication Sig Dispense Refill   acetaminophen (TYLENOL) 650 MG CR tablet Take 650 mg by mouth  every 8 (eight) hours as needed for pain.     apixaban (ELIQUIS) 5 MG TABS tablet Take 1 tablet (5 mg total) by mouth 2 (two) times daily. 28 tablet    cholecalciferol (VITAMIN D3) 25 MCG (1000 UNIT) tablet Take 1,000 Units by mouth daily.     cyanocobalamin (,VITAMIN B-12,) 1000 MCG/ML injection Inject 1,000 mcg into the muscle every 30 (thirty) days.     diltiazem (CARDIZEM CD) 120 MG 24 hr capsule TAKE 1 CAPSULE BY MOUTH EVERY DAY 90 capsule 3   empagliflozin (JARDIANCE) 10 MG TABS tablet Take 1 tablet (10 mg total) by mouth daily before breakfast. 14 tablet    ezetimibe (ZETIA) 10 MG tablet Take 1 tablet (10 mg total) by mouth daily. 90 tablet 3   famotidine (PEPCID) 20 MG tablet Take 20 mg by mouth daily as needed for heartburn or indigestion.     furosemide (LASIX) 20 MG tablet Take 20 mg daily alternating with 40 mg every other day 90 tablet 1   levothyroxine (SYNTHROID) 88 MCG tablet Take 88 mcg by mouth daily before breakfast.     loratadine (CLARITIN) 10 MG tablet Take 10 mg by mouth daily.     metoprolol tartrate (LOPRESSOR) 50 MG tablet TAKE 1 TABLET BY MOUTH TWICE A DAY 90 tablet 3   Multiple Vitamin (MULTIVITAMIN WITH MINERALS) TABS tablet Take 1 tablet by mouth daily.     Multiple Vitamins-Minerals (PRESERVISION AREDS 2) CAPS Take 1 capsule by mouth 2 (two) times daily.     nitroGLYCERIN (NITROSTAT) 0.4 MG SL tablet PLACE 1 TABLET UNDER THE TONGUE EVERY 5 MINUTES AS NEEDED FOR CHEST PAIN 25 tablet 0   Polyethylene Glycol 400 (BLINK TEARS OP) Place 1 drop into both eyes daily as needed (dry eyes).     valsartan (DIOVAN) 80 MG tablet Take 1 tablet (80 mg total) by mouth daily. 90 tablet 3   No current facility-administered medications for this visit.    REVIEW OF SYSTEMS:    10 Point review of Systems was done is negative except as noted above.  PHYSICAL EXAMINATION: ECOG PERFORMANCE STATUS: 1 - Symptomatic but completely ambulatory  .  Vitals:   07/03/23 1148  BP: (!)  129/59  Pulse: 69  Resp: 18  Temp: 97.7 F (36.5 C)  SpO2: 97%   Filed Weights   07/03/23 1148  Weight: 211 lb 4.8 oz (95.8 kg)   .Body mass index is 38.65 kg/m.  GENERAL:alert, in no acute distress and comfortable SKIN: no acute rashes, no significant lesions EYES: conjunctiva are pink and non-injected, sclera anicteric OROPHARYNX: MMM, no exudates, no oropharyngeal erythema or ulceration NECK: supple, no JVD LYMPH:  no palpable lymphadenopathy in the cervical, axillary or inguinal regions LUNGS: clear to auscultation b/l with normal respiratory effort HEART: regular rate & rhythm ABDOMEN:  normoactive bowel sounds , non tender, not distended. Extremity: no pedal edema PSYCH: alert & oriented x 3 with fluent speech NEURO: no focal motor/sensory deficits  LABORATORY DATA:  I have reviewed the data as listed  .    Latest Ref Rng & Units 03/13/2023   10:56 AM 02/06/2023    3:06 PM 01/10/2023    3:26 PM  CBC  WBC 4.0 - 10.5 K/uL 8.5  7.8  9.7   Hemoglobin 12.0 - 15.0 g/dL 16.1  09.6  04.5   Hematocrit 36.0 - 46.0 % 41.6  43.3  37.4   Platelets 150 - 400 K/uL 235  255  262     .    Latest Ref Rng & Units 06/18/2023   11:27 AM 06/13/2023    9:17 AM 06/13/2023    9:15 AM  CMP  Glucose 70 - 99 mg/dL 84  81    BUN 8 - 27 mg/dL 25  17    Creatinine 4.09 - 1.00 mg/dL 8.11  9.14    Sodium 782 - 144 mmol/L 142  140    Potassium 3.5 - 5.2 mmol/L 4.7  5.8    Chloride 96 - 106 mmol/L 106  107    CO2 20 - 29 mmol/L 23  24    Calcium 8.7 - 10.3 mg/dL 9.6  9.5    Total Protein 6.0 - 8.5 g/dL 6.9   6.8   Total Bilirubin 0.0 - 1.2 mg/dL 0.2   0.4   Alkaline Phos 44 - 121 IU/L 89   86   AST 0 - 40 IU/L 33   27   ALT 0 - 32 IU/L 31   27     SURGICAL PATHOLOGY CASE: [id number] PATIENT: Samantha Short Surgical Pathology Report     Clinical History: mesenteric mass (cm)     FINAL MICROSCOPIC DIAGNOSIS:  A. MESENTERIC MASS, EXCISION: Mesenteric adipose with acute and  chronic inflammation, fat necrosis, fibrosis and dystrophic calcification No tumor seen  COMMENT:  As the biopsy shows marked crush and cautery artifact, three immunohistochemical stains performed with adequate control to further exclude the presence of malignancy.  A pancytokeratin (AE1/AE3) is negative for occult epithelial cells.  The crushed blue cells are negative for the neuroendocrine marker synaptophysin and further support that they are in fact inflammatory cells.  The B-cell markers CD20 stains some but not all the cells supporting a benign lymphocytic inflammatory infiltrate.     RADIOGRAPHIC STUDIES: I have personally reviewed the radiological images as listed and agreed with the findings in the report. CUP PACEART INCLINIC DEVICE CHECK  Result Date: 07/02/2023 Patient brought in acutely today to follow up from 10/11 device clinic visit and symptoms.  After adjustments made 10/11, patient continues to complain of intermittent dizziness, HA, SOB, chest tightness, unsteady on feet with exertional activities.  VP has decreased to 4%.  She is able to swim 4 laps which is improvement. Interrogation of device:  normal device function, sensing, threshold and lead impedance trends all stable per baseline.  Histograms are flat with little to no heart rate variability since starting Amiodarone.  Reviewed with Dr. Graciela Husbands.  Suspect amiodarone  has suppressed sinus node and prolonged conduction and decreased heart rate variability and created an increase in AP.  Overall amiodarone suppression may also be contributing to symptoms.  Orders today to turn on Rate response. Patient is very active. NOTE: patient experienced significant symptoms with ambulation after initial turning on of rate response at nominal settings.  After ambulation re-trial adjustments to rate response, patient tolerated well.  She was given device clinic number to call if any concerns or need for adjustment. PROGRAMMING  CHANGES MADE TODAY: 1. RV programmed threshold:  2.5@ .4  TURNED ADAPTIVE OFF - threshold test today 1.25@0 .4ms. 2.  PAV 320  SAV 350 3. Turned RATE RESPONSE ON * initial changes USR: 125, ADL rate: 95, ADL Response: 3, ADL setpoint 8,  Exertion response 3, Activity Threshold: Low  THESE CHANGES were too aggressive. * 2ndary changes - Successful Ambulation Test Rate Response adjustment:  USR - 120,  ADL RATE: 90,  ADL RESPONSE: 2,  ADL SETPOINT: 12, EXERTION RESPONSE:  2,  ACTIVITY THREHOLD: Med High.Syliva Overman, RN  CUP PACEART INCLINIC DEVICE CHECK  Result Date: 06/22/2023 Pt brought in for increase in RV pacing drastically up to 100% x several weeks accompanied by symptoms. Symptoms include chest discomfort, feeling heart pounding in her throat, and headache. Pt RV pacing 10% on presentation. Intrinsic A-V @ rest ~340ms. Intrinsic A-V w/ low-moderate exertion ~220-24ms. Peak HR 78 during a brisk walk of about 300 ft. Del, MDT rep, present for testing today. Paced AVD pulled out to and Sensed AVD pulled out to . MVP is already programmed on. Pt states she swims laps every day. Recently patient has only been able to do 2 laps down from a previous of 10 and 25 not so recently. She attributes some of this some oif the medications she is on. Del and I are in agreement due to the complicated clinical picture this pt would benefit from seeing Dr. Graciela Husbands sooner rather than later. Appointment scheduled for October 21 in [place].Raj Janus, RN  CUP PACEART REMOTE DEVICE CHECK  Result Date: 06/16/2023 Scheduled remote reviewed. Normal device function.  Significant increase in RV pacing to 100% over the last week, intermittently noted per trends, MVP is enabled to optimize intrinsic conduction.  Known history of high grade AV block per Epic. Next remote 91 days. - CS, CV[facility]   ASSESSMENT & PLAN:  Samantha Short is a 77 y.o. female who presents to the diagnostic clinic for evaluation for mesenteric  mass. We discussed possible etiologies including benign mass, carcinoid tumor or possible lymphoma. Interventional radiology reviewed images and di not recommend a percutaneous biopsy as there is high bleeding risk due to proximity of mesenteric arterial branches. Patient is scheduled for a PET dotatate scan on 01/25/2023. We will proceed with labs today to check tumor markers including chromogranin, CEA and CA19-9 levels. If chromogranin levels are elevated, we will proceed with PET dotatate as ordered. If chromogranin levels are normal, we will switch to standard PET/CT Scan. Based on upcoming scans, we will evaluate for biopsy for tissue diagnosis.    Samantha Short expressed understanding of the plan provided and has agreed to move  forward with the workup.    #Mesenteric mass: partially calcified  #Family history of cancer: --will send referral to genetics team for testing.  PLAN: -Discussed lab results from today, 07/03/2023, in detail with the patient.  -Chromogranin A level was elevated at 198.3 on 06/14/2023 lab.  PET/CT dotatate study on 01/25/2023 showed  Mild radiotracer activity associated with the central mesenteric mass. Findings most consistent with well different neuroendocrine tumor with mild avidity. --Discussed the biopsy result from 03/19/2023 in detail with the patient. Does not show any tumor. Mesenteric adipose with acute and chronic inflammation, fat necrosis,fibrosis and dystrophic calcification - no additional oncologic workup or intervention indicated  FOLLOW-UP: RTC with PCP .The total time spent in the appointment was 20 minutes* .  All of the patient's questions were answered with apparent satisfaction. The patient knows to call the clinic with any problems, questions or concerns.   Wyvonnia Lora MD MS AAHIVMS [facility] Hematology/Oncology Physician [facility]  .*Total Encounter Time as defined by the Centers for Medicare and Medicaid Services includes,  in addition to the face-to-face time of a patient visit (documented in the note above) non-face-to-face time: obtaining and reviewing outside history, ordering and reviewing medications, tests or procedures, care coordination (communications with other health care professionals or caregivers) and documentation in the medical record.   07/03/2023 10:06 AM    I,Param Shah,acting as a scribe for Wyvonnia Lora, MD.,have documented all relevant documentation on the behalf of Wyvonnia Lora, MD,as directed by  Wyvonnia Lora, MD while in the presence of Wyvonnia Lora, MD.   .I have reviewed the above documentation for accuracy and completeness, and I agree with the above. Johney Maine MD

## 2023-07-04 ENCOUNTER — Encounter: Payer: Self-pay | Admitting: Pharmacist Clinician (PhC)/ Clinical Pharmacy Specialist

## 2023-07-04 ENCOUNTER — Ambulatory Visit
Payer: Medicare HMO | Attending: Cardiovascular Disease | Admitting: Pharmacist Clinician (PhC)/ Clinical Pharmacy Specialist

## 2023-07-04 VITALS — BP 131/85 | HR 62

## 2023-07-04 DIAGNOSIS — I5032 Chronic diastolic (congestive) heart failure: Secondary | ICD-10-CM | POA: Diagnosis not present

## 2023-07-04 NOTE — Patient Instructions (Signed)
Follow up appointment: with Ronie Spies on December 11  Take your BP meds as follows:  Continue with your current medications  Check your blood pressure at home daily (if able) and keep record of the readings.  Hypertension "High blood pressure"  Hypertension is often called "The Silent Killer." It rarely causes symptoms until it is extremely  high or has done damage to other organs in the body. For this reason, you should have your  blood pressure checked regularly by your physician. We will check your blood pressure  every time you see a provider at one of our offices.   Your blood pressure reading consists of two numbers. Ideally, blood pressure should be  below 120/80. The first ("top") number is called the systolic pressure. It measures the  pressure in your arteries as your heart beats. The second ("bottom") number is called the diastolic pressure. It measures the pressure in your arteries as the heart relaxes between beats.  The benefits of getting your blood pressure under control are enormous. A 10-point  reduction in systolic blood pressure can reduce your risk of stroke by 27% and heart failure by 28%  Your blood pressure goal is  130/80  To check your pressure at home you will need to:  1. Sit up in a chair, with feet flat on the floor and back supported. Do not cross your ankles or legs. 2. Rest your left arm so that the cuff is about heart level. If the cuff goes on your upper arm,  then just relax the arm on the table, arm of the chair or your lap. If you have a wrist cuff, we  suggest relaxing your wrist against your chest (think of it as Pledging the Flag with the  wrong arm).  3. Place the cuff snugly around your arm, about 1 inch above the crook of your elbow. The  cords should be inside the groove of your elbow.  4. Sit quietly, with the cuff in place, for about 5 minutes. After that 5 minutes press the power  button to start a reading. 5. Do not talk or move  while the reading is taking place.  6. Record your readings on a sheet of paper. Although most cuffs have a memory, it is often  easier to see a pattern developing when the numbers are all in front of you.  7. You can repeat the reading after 1-3 minutes if it is recommended  Make sure your bladder is empty and you have not had caffeine or tobacco within the last 30 min  Always bring your blood pressure log with you to your appointments. If you have not brought your monitor in to be double checked for accuracy, please bring it to your next appointment.  You can find a list of quality blood pressure cuffs at [email or web address]

## 2023-07-04 NOTE — Progress Notes (Signed)
Office Visit    Patient Name: Samantha Short Date of Encounter: 07/04/2023  Primary Care Provider:  Mila Palmer, MD Primary Cardiologist:  Verne Carrow, MD  Chief Complaint    Heart Failure Medication Titration - EF 60-65% (by echo 12/2022)  Significant Past Medical History   CAD 2010 CABG x 1  HTN Controlled with GDMT  HLD LDL at 78 on ezetimibe 10  AV block PPM   AF CHADS2-VASc = 6, on Eliquis    Allergies  Allergen Reactions   Ceclor [Cefaclor] Other (See Comments)    Lost vision in eye   Hctz [Hydrochlorothiazide] Other (See Comments)    Renal insufficiency   Lipitor [Atorvastatin Calcium] Other (See Comments)    Increased A1C   Norvasc [Amlodipine] Other (See Comments)    Makes patient stiff   Penicillins Hives, Shortness Of Breath, Itching and Rash   Sulfa Antibiotics Other (See Comments)    CAUSES SHOCK   Statins Other (See Comments)    MYALGIAS AND WEAKNESS   Milk-Related Compounds Diarrhea and Nausea And Vomiting   Erythromycin Rash   Latex Rash   Levofloxacin Rash   Tetracyclines & Related Rash    History of Present Illness    Samantha Short is a 77 y.o. female patient of Drs. McAlhany and Graciela Husbands, in the office today for heart failure medication titration.  She was seen last month by Carmela Hurt PharmD in August and valsartan 80 mg daily was added to her regimen.  She tolerated it well and BP dropped to normal range.  She was then asked to add spironolactone, but unfortunately this caused hyperkalemia as well as polyuria and was stopped shortly afterward.   Today she is on 3/4 GDMT and tolerating those without concern.  She saw Dr. Graciela Husbands earlier this week and is now happy to be off the amiodarone.  Notes that she is already feeling much better.  She does continue to have some LEE and currently takes furosemide 20 mg every other day/40 mg every other day.    Blood Pressure Goal:  130/80  GDMT: ACEI/ARB/ARNI [x] Yes [] No Valsartan 80 mg  every day   Beta blocker [x] Yes [] No Metoprolol tart 50 mg bid  MRA [] Yes [x] No Spironolactone caused hyperkalemia, polyuria  SGLT2 inhibitor [x] Yes [] No Empagliflozin 10 mg every day    Family Hx:   both parents died from MI, mother had multiple heart issues, brother had CABG x 3, sisters all with heart issues  Social Hx:      Tobacco: no  Alcohol: no  Caffeine: coffee daily  Diet:    daughter on Ozempic (has lost 80 pounds), does the cooking, so more Mediterranean style; vegetables fresh, higher protein as able  Exercise: swimming - 3-4 times per week, water aerobics, walks 30 min/day  Home BP readings:   has 21 readings from the last month, average 122/69 HR 60.  (Range 102-139/58-88)   Accessory Clinical Findings    Lab Results  Component Value Date   CREATININE 1.32 (H) 06/18/2023   BUN 25 06/18/2023   NA 142 06/18/2023   K 4.7 06/18/2023   CL 106 06/18/2023   CO2 23 06/18/2023   Lab Results  Component Value Date   ALT 31 06/18/2023   AST 33 06/18/2023   ALKPHOS 89 06/18/2023   BILITOT 0.2 06/18/2023   Lab Results  Component Value Date   HGBA1C 5.7 (H) 01/02/2023    Home Medications/Allergies    Current Outpatient Medications  Medication  Sig Dispense Refill   acetaminophen (TYLENOL) 650 MG CR tablet Take 650 mg by mouth every 8 (eight) hours as needed for pain.     apixaban (ELIQUIS) 5 MG TABS tablet Take 1 tablet (5 mg total) by mouth 2 (two) times daily. 28 tablet    cholecalciferol (VITAMIN D3) 25 MCG (1000 UNIT) tablet Take 1,000 Units by mouth daily.     cyanocobalamin (,VITAMIN B-12,) 1000 MCG/ML injection Inject 1,000 mcg into the muscle every 30 (thirty) days.     diltiazem (CARDIZEM CD) 120 MG 24 hr capsule TAKE 1 CAPSULE BY MOUTH EVERY DAY 90 capsule 3   empagliflozin (JARDIANCE) 10 MG TABS tablet Take 1 tablet (10 mg total) by mouth daily before breakfast. 14 tablet    ezetimibe (ZETIA) 10 MG tablet Take 1 tablet (10 mg total) by mouth daily. 90  tablet 3   famotidine (PEPCID) 20 MG tablet Take 20 mg by mouth daily as needed for heartburn or indigestion.     furosemide (LASIX) 20 MG tablet Take 20 mg daily alternating with 40 mg every other day 90 tablet 1   levothyroxine (SYNTHROID) 88 MCG tablet Take 88 mcg by mouth daily before breakfast.     loratadine (CLARITIN) 10 MG tablet Take 10 mg by mouth daily.     metoprolol tartrate (LOPRESSOR) 50 MG tablet TAKE 1 TABLET BY MOUTH TWICE A DAY 90 tablet 3   Multiple Vitamin (MULTIVITAMIN WITH MINERALS) TABS tablet Take 1 tablet by mouth daily.     Multiple Vitamins-Minerals (PRESERVISION AREDS 2) CAPS Take 1 capsule by mouth 2 (two) times daily.     nitroGLYCERIN (NITROSTAT) 0.4 MG SL tablet PLACE 1 TABLET UNDER THE TONGUE EVERY 5 MINUTES AS NEEDED FOR CHEST PAIN 25 tablet 0   Polyethylene Glycol 400 (BLINK TEARS OP) Place 1 drop into both eyes daily as needed (dry eyes).     valsartan (DIOVAN) 80 MG tablet Take 1 tablet (80 mg total) by mouth daily. 90 tablet 3   No current facility-administered medications for this visit.     Allergies  Allergen Reactions   Ceclor [Cefaclor] Other (See Comments)    Lost vision in eye   Hctz [Hydrochlorothiazide] Other (See Comments)    Renal insufficiency   Lipitor [Atorvastatin Calcium] Other (See Comments)    Increased A1C   Norvasc [Amlodipine] Other (See Comments)    Makes patient stiff   Penicillins Hives, Shortness Of Breath, Itching and Rash   Sulfa Antibiotics Other (See Comments)    CAUSES SHOCK   Statins Other (See Comments)    MYALGIAS AND WEAKNESS   Milk-Related Compounds Diarrhea and Nausea And Vomiting   Erythromycin Rash   Latex Rash   Levofloxacin Rash   Tetracyclines & Related Rash       Assessment & Plan      Chronic diastolic CHF (congestive heart failure) (HCC) Assessment: BP in office today is 131/85 Patient with HFpEF Tolerates current medications without any side effects Denies SOB, palpitation, chest pain,  headaches Still has some LEE, currently on furosemide 20 mg every other day/40 mg qod No concerns with regards to cost of medications, compliance, ability to pick up at pharmacy  (has patient assistance for Jardiance and Eliquis) Reiterated the importance of regular exercise and low salt diet  Plan: GDMT ACEI/ARB/ARNI Valsartan 80 mg qd   Beta blocker Metoprolol tart 50 mg bid   MRA none Spiro - hyperkalemia, polyuria  SGLT2 Empagliflozin 10 mg qd    Labs  orderd:  none today Follow up with Ronie Spies in December   Phillips Hay PharmD CPP [facility]  [address] [place], [place] [address] [phone]

## 2023-07-04 NOTE — Assessment & Plan Note (Signed)
Assessment: BP in office today is 131/85 Patient with HFpEF Tolerates current medications without any side effects Denies SOB, palpitation, chest pain, headaches Still has some LEE, currently on furosemide 20 mg every other day/40 mg qod No concerns with regards to cost of medications, compliance, ability to pick up at pharmacy  (has patient assistance for Jardiance and Eliquis) Reiterated the importance of regular exercise and low salt diet  Plan: GDMT ACEI/ARB/ARNI Valsartan 80 mg qd   Beta blocker Metoprolol tart 50 mg bid   MRA none Spiro - hyperkalemia, polyuria  SGLT2 Empagliflozin 10 mg qd    Labs orderd:  none today Follow up with Ronie Spies in December

## 2023-07-06 DIAGNOSIS — G4733 Obstructive sleep apnea (adult) (pediatric): Secondary | ICD-10-CM | POA: Diagnosis not present

## 2023-07-10 DIAGNOSIS — K668 Other specified disorders of peritoneum: Secondary | ICD-10-CM | POA: Diagnosis not present

## 2023-07-10 DIAGNOSIS — K6389 Other specified diseases of intestine: Secondary | ICD-10-CM | POA: Diagnosis not present

## 2023-07-13 ENCOUNTER — Other Ambulatory Visit: Payer: Self-pay | Admitting: Surgery

## 2023-07-13 DIAGNOSIS — K6389 Other specified diseases of intestine: Secondary | ICD-10-CM

## 2023-07-17 ENCOUNTER — Telehealth: Payer: Self-pay

## 2023-07-17 NOTE — Telephone Encounter (Signed)
Transmission received and I printed it out and gave it to Dr. Graciela Husbands.

## 2023-07-17 NOTE — Telephone Encounter (Signed)
I spoke with the patient and she agreed to send a transmission when she get off work.

## 2023-07-18 ENCOUNTER — Telehealth: Payer: Self-pay

## 2023-07-18 NOTE — Telephone Encounter (Signed)
Pt having continued symptoms from 10/11 +10/21 appointments. Reviewed w/ SK. Stopped dilt and metop d/t graph trends indicating suppressed SN activity. Appointment made 11/11 in [place] @ 0800 per SK.

## 2023-07-19 DIAGNOSIS — D122 Benign neoplasm of ascending colon: Secondary | ICD-10-CM | POA: Diagnosis not present

## 2023-07-19 DIAGNOSIS — R0901 Asphyxia: Secondary | ICD-10-CM | POA: Diagnosis not present

## 2023-07-19 DIAGNOSIS — R933 Abnormal findings on diagnostic imaging of other parts of digestive tract: Secondary | ICD-10-CM | POA: Diagnosis not present

## 2023-07-19 DIAGNOSIS — K5730 Diverticulosis of large intestine without perforation or abscess without bleeding: Secondary | ICD-10-CM | POA: Diagnosis not present

## 2023-07-19 DIAGNOSIS — K2940 Chronic atrophic gastritis without bleeding: Secondary | ICD-10-CM | POA: Diagnosis not present

## 2023-07-19 DIAGNOSIS — K2930 Chronic superficial gastritis without bleeding: Secondary | ICD-10-CM | POA: Diagnosis not present

## 2023-07-19 DIAGNOSIS — D120 Benign neoplasm of cecum: Secondary | ICD-10-CM | POA: Diagnosis not present

## 2023-07-19 DIAGNOSIS — D123 Benign neoplasm of transverse colon: Secondary | ICD-10-CM | POA: Diagnosis not present

## 2023-07-23 ENCOUNTER — Ambulatory Visit: Payer: Medicare HMO | Attending: Internal Medicine | Admitting: Internal Medicine

## 2023-07-23 ENCOUNTER — Encounter: Payer: Medicare HMO | Admitting: Internal Medicine

## 2023-07-23 ENCOUNTER — Encounter: Payer: Self-pay | Admitting: Internal Medicine

## 2023-07-23 VITALS — BP 102/66 | HR 91 | Ht 62.0 in | Wt 212.0 lb

## 2023-07-23 DIAGNOSIS — R001 Bradycardia, unspecified: Secondary | ICD-10-CM | POA: Insufficient documentation

## 2023-07-23 DIAGNOSIS — Z950 Presence of cardiac pacemaker: Secondary | ICD-10-CM

## 2023-07-23 DIAGNOSIS — I4819 Other persistent atrial fibrillation: Secondary | ICD-10-CM

## 2023-07-23 DIAGNOSIS — I441 Atrioventricular block, second degree: Secondary | ICD-10-CM

## 2023-07-23 MED ORDER — METOPROLOL TARTRATE 50 MG PO TABS: 50.0000 mg | ORAL_TABLET | Freq: Two times a day (BID) | ORAL | Status: DC

## 2023-07-23 NOTE — Progress Notes (Deleted)
Patient Care Team: Mila Palmer, MD as PCP - General (Family Medicine) Kathleene Hazel, MD as PCP - Cardiology (Cardiology) Duke Salvia, MD as PCP - Electrophysiology (Cardiology) Johney Maine, MD as Consulting Physician (Hematology)   HPI  Samantha Short is a 77 y.o. female seen in followup for pacemaker [employer] implanted 7/21 for symptomatic second-degree heart block and intermittent complete heart block.  Procedure was complicated by microperforation requiring lead revision.  Coronary artery disease with catheterization 2010 demonstrating LAD proximal stenosis 80% for which she underwent LIMA-to the LAD  6/24 hospitalized with Afib RVR manifesting with chest pain; spontaneous conversion and then reversion.  Amio initiated.    Cleda Daub added and stopped 2/2 hyperkalemia  10/20 for complaints of increasing exercise intolerance (swimming laps down from 10-25--2) associated with 100% ventricular pacing accompanied by palpitations and chest discomfort.  A mesenteric calcified mass was noted incidentally; laparoscopic biopsies apparently are negative.  Oncology >>no additional oncologic workup or intervention indicated.    She relates the incredible stress of living at home with her daughter and her autistic adopted grandson who is nonverbal and violent      DATE TEST EF   7/21 Echo and  65% %   7/21 cMRI  79 catheter % No amyloid  7/21 LHC  LIMA-LAD patent but small LADp20% Ao Stenosis mean Grad   8/23 Echo 65-70% AoStenosis  Mean Grad 23  4/24 Echo  60-65% Ao Stenosis Mean Grad 17     Date Cr K Hgb TSH LFTs  7/22 1.35 4.2 13.8 8.4   8/23    3.98    10/24 1.32 4.7<<5.8 13.3   33   Thromboembolic risk factors ( age  -2, HTN-1, Vasc disease -1, Gender-1) for a CHADSVASc Score of >=5    Records and Results Reviewed   Past Medical History:  Diagnosis Date   Anemia    pernicious   Anxiety    Arthritis    Asthma    Chronic diastolic CHF  (congestive heart failure) (HCC)    a. 02/2011 Echo: EF 55-60%, Gr2 DD, Mild MR   Chronic kidney disease    stage 3   Complication of anesthesia    Coronary artery disease    a. s/p CABG x 1 2010:  LIMA->LAD.;  b. amdx for CP => LHC 07/04/12: LAD 70-80%, mid RCA 30%, LIMA-LAD patent with competitive flow limiting distal LAD filling, EF 70% with hyperdynamic LV function. Medical therapy continued.   Depression    Dyslipidemia    GERD (gastroesophageal reflux disease)    Headache    hx of migraines   Heart murmur    History of hiatal hernia    History of kidney stones    Hyperlipidemia    Hypertension    Hyperthyroidism    Hypothyroidism    Mild aortic stenosis    Mitral regurgitation    a. mild by echo 02/2011.   PAF (paroxysmal atrial fibrillation) (HCC)    Pneumonia    PONV (postoperative nausea and vomiting)    Pre-diabetes    Presence of permanent cardiac pacemaker    Sleep apnea    diagnosed 08-2022    Past Surgical History:  Procedure Laterality Date   ABDOMINAL HYSTERECTOMY  1980   CARDIAC CATHETERIZATION  07/27/09 & 07/28/09   CESAREAN SECTION     x 2   CORONARY ARTERY BYPASS GRAFT  08/03/2009   x1 using left internal mammary artery to distal left  anterior  descending coronary artery.    GALLBLADDER SURGERY  2001   LAPAROSCOPY N/A 03/19/2023   Procedure: LAPAROSCOPY DIAGNOSTIC;  Surgeon: Fritzi Mandes, MD;  Location: [facility] OR;  Service: General;  Laterality: N/A;   LEFT HEART CATHETERIZATION WITH CORONARY/GRAFT ANGIOGRAM N/A 07/05/2012   Procedure: LEFT HEART CATHETERIZATION WITH Isabel Caprice;  Surgeon: Wendall Stade, MD;  Location: [facility] CATH LAB;  Service: Cardiovascular;  Laterality: N/A;   MASS EXCISION N/A 03/19/2023   Procedure: BIOPSY OF MESENTERIC MASS;  Surgeon: Fritzi Mandes, MD;  Location: [facility] OR;  Service: General;  Laterality: N/A;   PACEMAKER IMPLANT N/A 03/17/2020   Procedure: PACEMAKER IMPLANT;  Surgeon: Duke Salvia, MD;  Location: [facility]  INVASIVE CV LAB;  Service: Cardiovascular;  Laterality: N/A;   RIGHT/LEFT HEART CATH AND CORONARY/GRAFT ANGIOGRAPHY N/A 03/17/2020   Procedure: RIGHT/LEFT HEART CATH AND CORONARY/GRAFT ANGIOGRAPHY;  Surgeon: Iran Ouch, MD;  Location: [facility] INVASIVE CV LAB;  Service: Cardiovascular;  Laterality: N/A;   SHOULDER SURGERY  1998 / 2001   from accident   rotator cuff   TOTAL HIP ARTHROPLASTY Right 08/30/2022   Procedure: TOTAL HIP ARTHROPLASTY ANTERIOR APPROACH;  Surgeon: Ollen Gross, MD;  Location: [facility] ORS;  Service: Orthopedics;  Laterality: Right;   VESICOVAGINAL FISTULA CLOSURE W/ TAH  1998    No outpatient medications have been marked as taking for the 07/23/23 encounter (Appointment) with Duke Salvia, MD.    Allergies  Allergen Reactions   Ceclor [Cefaclor] Other (See Comments)    Lost vision in eye   Hctz [Hydrochlorothiazide] Other (See Comments)    Renal insufficiency   Lipitor [Atorvastatin Calcium] Other (See Comments)    Increased A1C   Norvasc [Amlodipine] Other (See Comments)    Makes patient stiff   Penicillins Hives, Shortness Of Breath, Itching and Rash   Sulfa Antibiotics Other (See Comments)    CAUSES SHOCK   Statins Other (See Comments)    MYALGIAS AND WEAKNESS   Milk-Related Compounds Diarrhea and Nausea And Vomiting   Erythromycin Rash   Latex Rash   Levofloxacin Rash   Tetracyclines & Related Rash      Review of Systems negative except from HPI and PMH  Physical Exam  There were no vitals taken for this visit. Well developed and well nourished in no acute distress HENT normal Neck supple with JVP-flat Clear Device pocket well healed; without hematoma or erythema.  There is no tethering  Regular rate and rhythm, no *** gallop No ***/*** murmur Abd-soft with active BS No Clubbing cyanosis *** edema Skin-warm and dry A & Oriented  Grossly normal sensory and motor function  ECG ***  Device function is ***normal. ***Programming changes  ***  See Paceart for details    Device function is normal. Programming changes AV delays were lengthened; rate response was activated See Paceart for details   Initial programming resulted in a low threshold, slope's of 3 and 3, and ADL rate/exercise rate of 46/962.  This was quite symptomatic and the threshold was changed to medium high, slopes 2/2 and rates 90/115 with significant improvement with walking   CrCl cannot be calculated (Patient's most recent lab result is older than the maximum 21 days allowed.).   Assessment and  Plan Heart block-second-degree intermittent   Sinus bradycardia-iatrogenic  Chest pain with ventricular pacing   Dyspnea on exertion    Coronary artery disease-single-vessel CABG  Granulomatous lung disease  Obesity   Aortic stenosis-mild   Renal insufficiency  Atrial fibrillation persistent  Psychosocial stress   Intermittent heart block has been associate with periodic ventricular pacing which is complicated by significant pain.  Interestingly, when she was pacing with pseudofusion she had no pain suggesting that the pain itself is a consequence of ventricular contraction as opposed to the paced impulse.  This is something I have seen before although infrequently.  To mitigate this we have reprogrammed AV delays to lengthen them  She also had iatrogenic bradycardia coincidental with initiation of amiodarone.  We have discontinued it.  This likely is also contributed to the ventricular pacing burden by slowing AV nodal conduction.  We have activated rate response.  In the that she has recurrent atrial fibrillation which despite rate control has significant symptoms we will need to consider alternative antiarrhythmic strategies.  Ventricular pacing with her current configuration would be unacceptable therefore AV junction ablation is not an alternative.  As a relates to the ventricular pacing pain, given her conduction system disease, there may be  need to revise her pacing system either by adding a lead pursuing conduction system pacing or by extracting and then adding.  No bleeding.  Continue the Eliquis.   PVCs are quiescient inferred from histogram data  I spent today reviewing her records including previous charts and labs, taking her history and face-to-face counseling,  adjusting the device , and documentation

## 2023-07-23 NOTE — Progress Notes (Signed)
Patient Care Team: Mila Palmer, MD as PCP - General (Family Medicine) Kathleene Hazel, MD as PCP - Cardiology (Cardiology) Duke Salvia, MD as PCP - Electrophysiology (Cardiology) Johney Maine, MD as Consulting Physician (Hematology)   HPI  Samantha Short is a 77 y.o. female seen in followup for pacemaker [employer] implanted 7/21 for symptomatic second-degree heart block and intermittent complete heart block.  Procedure was complicated by microperforation requiring lead revision.  Coronary artery disease with catheterization 2010 demonstrating LAD proximal stenosis 80% for which she underwent LIMA-to the LAD  6/24 hospitalized with Afib RVR manifesting with chest pain; spontaneous conversion and then reversion.  Amio initiated.    Cleda Daub added and stopped 2/2 hyperkalemia  10/20 for complaints of increasing exercise intolerance (swimming laps down from 10-25--2) associated with 100% ventricular pacing accompanied by palpitations and chest discomfort.  A mesenteric calcified mass was noted incidentally; laparoscopic biopsies apparently are negative.  Oncology is to be seen tomorrow.  She relates the incredible stress of living at home with her daughter and her autistic adopted grandson who is nonverbal and violent Seen today because of complaints of pain intermittently which have been going on for about a year    DATE TEST EF   7/21 Echo and  65% %   7/21 cMRI  79 catheter % No amyloid  7/21 LHC  LIMA-LAD patent but small LADp20% Ao Stenosis mean Grad   8/23 Echo 65-70% AoStenosis  Mean Grad 23  4/24 Echo  60-65% Ao Stenosis Mean Grad 17     Date Cr K Hgb TSH LFTs  7/22 1.35 4.2 13.8 8.4   8/23    3.98    10/24 1.32 4.7<<5.8 13.3   33   Thromboembolic risk factors ( age  -1, HTN-1, Vasc disease -1, Gender-1) for a CHADSVASc Score of >=5    Records and Results Reviewed   Past Medical History:  Diagnosis Date   Anemia    pernicious    Anxiety    Arthritis    Asthma    Chronic diastolic CHF (congestive heart failure) (HCC)    a. 02/2011 Echo: EF 55-60%, Gr2 DD, Mild MR   Chronic kidney disease    stage 3   Complication of anesthesia    Coronary artery disease    a. s/p CABG x 1 2010:  LIMA->LAD.;  b. amdx for CP => LHC 07/04/12: LAD 70-80%, mid RCA 30%, LIMA-LAD patent with competitive flow limiting distal LAD filling, EF 70% with hyperdynamic LV function. Medical therapy continued.   Depression    Dyslipidemia    GERD (gastroesophageal reflux disease)    Headache    hx of migraines   Heart murmur    History of hiatal hernia    History of kidney stones    Hyperlipidemia    Hypertension    Hyperthyroidism    Hypothyroidism    Mild aortic stenosis    Mitral regurgitation    a. mild by echo 02/2011.   PAF (paroxysmal atrial fibrillation) (HCC)    Pneumonia    PONV (postoperative nausea and vomiting)    Pre-diabetes    Presence of permanent cardiac pacemaker    Sleep apnea    diagnosed 08-2022    Past Surgical History:  Procedure Laterality Date   ABDOMINAL HYSTERECTOMY  1980   CARDIAC CATHETERIZATION  07/27/09 & 07/28/09   CESAREAN SECTION     x 2   CORONARY ARTERY BYPASS GRAFT  08/03/2009  x1 using left internal mammary artery to distal left anterior  descending coronary artery.    GALLBLADDER SURGERY  2001   LAPAROSCOPY N/A 03/19/2023   Procedure: LAPAROSCOPY DIAGNOSTIC;  Surgeon: Fritzi Mandes, MD;  Location: [facility] OR;  Service: General;  Laterality: N/A;   LEFT HEART CATHETERIZATION WITH CORONARY/GRAFT ANGIOGRAM N/A 07/05/2012   Procedure: LEFT HEART CATHETERIZATION WITH Isabel Caprice;  Surgeon: Wendall Stade, MD;  Location: [facility] CATH LAB;  Service: Cardiovascular;  Laterality: N/A;   MASS EXCISION N/A 03/19/2023   Procedure: BIOPSY OF MESENTERIC MASS;  Surgeon: Fritzi Mandes, MD;  Location: [facility] OR;  Service: General;  Laterality: N/A;   PACEMAKER IMPLANT N/A 03/17/2020   Procedure: PACEMAKER  IMPLANT;  Surgeon: Duke Salvia, MD;  Location: [facility] INVASIVE CV LAB;  Service: Cardiovascular;  Laterality: N/A;   RIGHT/LEFT HEART CATH AND CORONARY/GRAFT ANGIOGRAPHY N/A 03/17/2020   Procedure: RIGHT/LEFT HEART CATH AND CORONARY/GRAFT ANGIOGRAPHY;  Surgeon: Iran Ouch, MD;  Location: [facility] INVASIVE CV LAB;  Service: Cardiovascular;  Laterality: N/A;   SHOULDER SURGERY  1998 / 2001   from accident   rotator cuff   TOTAL HIP ARTHROPLASTY Right 08/30/2022   Procedure: TOTAL HIP ARTHROPLASTY ANTERIOR APPROACH;  Surgeon: Ollen Gross, MD;  Location: [facility] ORS;  Service: Orthopedics;  Laterality: Right;   VESICOVAGINAL FISTULA CLOSURE W/ TAH  1998    Current Meds  Medication Sig   acetaminophen (TYLENOL) 650 MG CR tablet Take 650 mg by mouth every 8 (eight) hours as needed for pain.   apixaban (ELIQUIS) 5 MG TABS tablet Take 1 tablet (5 mg total) by mouth 2 (two) times daily.   cholecalciferol (VITAMIN D3) 25 MCG (1000 UNIT) tablet Take 1,000 Units by mouth daily.   cyanocobalamin (,VITAMIN B-12,) 1000 MCG/ML injection Inject 1,000 mcg into the muscle every 30 (thirty) days.   empagliflozin (JARDIANCE) 10 MG TABS tablet Take 1 tablet (10 mg total) by mouth daily before breakfast.   famotidine (PEPCID) 20 MG tablet Take 20 mg by mouth daily as needed for heartburn or indigestion.   furosemide (LASIX) 20 MG tablet Take 20 mg daily alternating with 40 mg every other day   levothyroxine (SYNTHROID) 88 MCG tablet Take 88 mcg by mouth daily before breakfast.   loratadine (CLARITIN) 10 MG tablet Take 10 mg by mouth daily.   Multiple Vitamin (MULTIVITAMIN WITH MINERALS) TABS tablet Take 1 tablet by mouth daily.   Multiple Vitamins-Minerals (PRESERVISION AREDS 2) CAPS Take 1 capsule by mouth 2 (two) times daily.   nitroGLYCERIN (NITROSTAT) 0.4 MG SL tablet PLACE 1 TABLET UNDER THE TONGUE EVERY 5 MINUTES AS NEEDED FOR CHEST PAIN   pantoprazole (PROTONIX) 40 MG tablet Take 40 mg by mouth daily.    Polyethylene Glycol 400 (BLINK TEARS OP) Place 1 drop into both eyes daily as needed (dry eyes).   valsartan (DIOVAN) 80 MG tablet Take 1 tablet (80 mg total) by mouth daily.    Allergies  Allergen Reactions   Ceclor [Cefaclor] Other (See Comments)    Lost vision in eye   Hctz [Hydrochlorothiazide] Other (See Comments)    Renal insufficiency   Lipitor [Atorvastatin Calcium] Other (See Comments)    Increased A1C   Norvasc [Amlodipine] Other (See Comments)    Makes patient stiff   Penicillins Hives, Shortness Of Breath, Itching and Rash   Sulfa Antibiotics Other (See Comments)    CAUSES SHOCK   Statins Other (See Comments)    MYALGIAS AND WEAKNESS   Milk-Related  Compounds Diarrhea and Nausea And Vomiting   Erythromycin Rash   Latex Rash   Levofloxacin Rash   Tetracyclines & Related Rash      Review of Systems negative except from HPI and PMH  Physical Exam  BP 102/66   Pulse 91   Ht 5\' 2"  (1.575 m)   Wt 212 lb (96.2 kg)   SpO2 98%   BMI 38.78 kg/m  Well developed and well nourished in no acute distress HENT normal Neck supple with JVP-flat Clear Device pocket well healed; without hematoma or erythema.  There is no tethering  Regular rate and rhythm, no  gallop 3/6 murmur Abd-soft with active BS No Clubbing cyanosis  edema Skin-warm and dry A & Oriented  Grossly normal sensory and motor function  ECG sinus rhythm with intrinsic conduction Intervals 25/13/46 Right bundle branch block  Device function is normal. Programming changes AV delays were lengthened; rate response was activated See Paceart for details   Initial programming resulted in a low threshold, slope's of 3 and 3, and ADL rate/exercise rate of 13/244.  This was quite symptomatic and the threshold was changed to medium high, slopes 2/2 and rates 90/115 with significant improvement with walking   CrCl cannot be calculated (Patient's most recent lab result is older than the maximum 21 days  allowed.).   Assessment and  Plan Heart block-second-degree intermittent   Sinus bradycardia-iatrogenic  Chest pain with ventricular pacing   Dyspnea on exertion    Coronary artery disease-single-vessel CABG  Granulomatous lung disease  Obesity   Aortic stenosis-mild   Renal insufficiency    Atrial fibrillation persistent  Psychosocial stress   Still with pain episodically with for reasons that are not clear to me.  Occasionally was associated with termination of MVP pacing, we could not reproduce it with high output atrial pacing high output ventricular pacing dyssynchronous pacing.  I ended up shortening the AV delay because even though reports only 60% ventricular pacing today she was pacemaker to ended.  She seemed to feel better with a more appropriate AV delay.  The stresses with her grandson remain overwhelming

## 2023-07-23 NOTE — Patient Instructions (Addendum)
Medication Instructions:  Your physician has recommended you make the following change in your medication:   ** Resume Metoprolol Tartrate 50mg  - 1 tablet by mouth twice daily  *If you need a refill on your cardiac medications before your next appointment, please call your pharmacy*   Lab Work: None ordered.  If you have labs (blood work) drawn today and your tests are completely normal, you will receive your results only by: MyChart Message (if you have MyChart) OR A paper copy in the mail If you have any lab test that is abnormal or we need to change your treatment, we will call you to review the results.   Testing/Procedures: None ordered.    Follow-Up: At [facility], you and your health needs are our priority.  As part of our continuing mission to provide you with exceptional heart care, we have created designated Provider Care Teams.  These Care Teams include your primary Cardiologist (physician) and Advanced Practice Providers (APPs -  Physician Assistants and Nurse Practitioners) who all work together to provide you with the care you need, when you need it.  We recommend signing up for the patient portal called "MyChart".  Sign up information is provided on this After Visit Summary.  MyChart is used to connect with patients for Virtual Visits (Telemedicine).  Patients are able to view lab/test results, encounter notes, upcoming appointments, etc.  Non-urgent messages can be sent to your provider as well.   To learn more about what you can do with MyChart, go to [email or web address].    Your next appointment:   As scheduled  Other Instructions ZIO XT- Long Term Monitor Instructions  Your physician has requested you wear a ZIO patch monitor for 3 days.  This is a single patch monitor. [employer] supplies one patch monitor per enrollment. Additional stickers are not available. Please do not apply patch if you will be having a Nuclear Stress Test,  Echocardiogram,  Cardiac CT, MRI, or Chest Xray during the period you would be wearing the  monitor. The patch cannot be worn during these tests. You cannot remove and re-apply the  ZIO XT patch monitor.  Your ZIO patch monitor will be mailed 3 day USPS to your address on file. It may take 3-5 days  to receive your monitor after you have been enrolled.  Once you have received your monitor, please review the enclosed instructions. Your monitor  has already been registered assigning a specific monitor serial # to you.  Billing and Patient Assistance Program Information  We have supplied [employer] with any of your insurance information on file for billing purposes. [employer] offers a sliding scale Patient Assistance Program for patients that do not have  insurance, or whose insurance does not completely cover the cost of the ZIO monitor.  You must apply for the Patient Assistance Program to qualify for this discounted rate.  To apply, please call [employer] at [phone], select option 4, select option 2, ask to apply for  Patient Assistance Program. Meredeth Ide will ask your household income, and how many people  are in your household. They will quote your out-of-pocket cost based on that information.  [employer] will also be able to set up a 36-month, interest-free payment plan if needed.  Applying the monitor   Shave hair from upper left chest.  Hold abrader disc by orange tab. Rub abrader in 40 strokes over the upper left chest as  indicated in your monitor instructions.  Clean area with 4 enclosed alcohol pads.  Let dry.  Apply patch as indicated in monitor instructions. Patch will be placed under collarbone on left  side of chest with arrow pointing upward.  Rub patch adhesive wings for 2 minutes. Remove white label marked "1". Remove the white  label marked "2". Rub patch adhesive wings for 2 additional minutes.  While looking in a mirror, press and release button in center of patch. A small green light will   flash 3-4 times. This will be your only indicator that the monitor has been turned on.  Do not shower for the first 24 hours. You may shower after the first 24 hours.  Press the button if you feel a symptom. You will hear a small click. Record Date, Time and  Symptom in the Patient Logbook.  When you are ready to remove the patch, follow instructions on the last 2 pages of Patient  Logbook. Stick patch monitor onto the last page of Patient Logbook.  Place Patient Logbook in the blue and white box. Use locking tab on box and tape box closed  securely. The blue and white box has prepaid postage on it. Please place it in the mailbox as  soon as possible. Your physician should have your test results approximately 7 days after the  monitor has been mailed back to [facility]  Call [facility] Customer Care at [phone] if you have questions regarding  your ZIO XT patch monitor. Call them immediately if you see an orange light blinking on your  monitor.  If your monitor falls off in less than 4 days, contact our Monitor department at [phone].  If your monitor becomes loose or falls off after 4 days call [employer] at [phone] for  suggestions on securing your monitor

## 2023-07-24 ENCOUNTER — Emergency Department (HOSPITAL_COMMUNITY): Payer: Medicare HMO

## 2023-07-24 ENCOUNTER — Ambulatory Visit: Payer: Medicare HMO | Attending: Internal Medicine

## 2023-07-24 ENCOUNTER — Other Ambulatory Visit: Payer: Self-pay

## 2023-07-24 ENCOUNTER — Telehealth: Payer: Self-pay

## 2023-07-24 ENCOUNTER — Inpatient Hospital Stay (HOSPITAL_COMMUNITY)
Admission: EM | Admit: 2023-07-24 | Discharge: 2023-07-26 | DRG: 280 | Disposition: A | Discharge status: Home / Self Care | Payer: Medicare HMO | Attending: Cardiology | Admitting: Cardiology

## 2023-07-24 ENCOUNTER — Encounter (HOSPITAL_COMMUNITY): Payer: Self-pay

## 2023-07-24 DIAGNOSIS — I249 Acute ischemic heart disease, unspecified: Principal | ICD-10-CM

## 2023-07-24 DIAGNOSIS — I25118 Atherosclerotic heart disease of native coronary artery with other forms of angina pectoris: Secondary | ICD-10-CM | POA: Diagnosis not present

## 2023-07-24 DIAGNOSIS — R1909 Other intra-abdominal and pelvic swelling, mass and lump: Secondary | ICD-10-CM | POA: Diagnosis not present

## 2023-07-24 DIAGNOSIS — R7989 Other specified abnormal findings of blood chemistry: Secondary | ICD-10-CM | POA: Diagnosis not present

## 2023-07-24 DIAGNOSIS — Z733 Stress, not elsewhere classified: Secondary | ICD-10-CM | POA: Diagnosis not present

## 2023-07-24 DIAGNOSIS — I2510 Atherosclerotic heart disease of native coronary artery without angina pectoris: Secondary | ICD-10-CM | POA: Diagnosis not present

## 2023-07-24 DIAGNOSIS — F32A Depression, unspecified: Secondary | ICD-10-CM | POA: Diagnosis not present

## 2023-07-24 DIAGNOSIS — I5043 Acute on chronic combined systolic (congestive) and diastolic (congestive) heart failure: Secondary | ICD-10-CM | POA: Diagnosis not present

## 2023-07-24 DIAGNOSIS — E785 Hyperlipidemia, unspecified: Secondary | ICD-10-CM | POA: Diagnosis not present

## 2023-07-24 DIAGNOSIS — Z609 Problem related to social environment, unspecified: Secondary | ICD-10-CM | POA: Diagnosis present

## 2023-07-24 DIAGNOSIS — Z79890 Hormone replacement therapy (postmenopausal): Secondary | ICD-10-CM

## 2023-07-24 DIAGNOSIS — I130 Hypertensive heart and chronic kidney disease with heart failure and stage 1 through stage 4 chronic kidney disease, or unspecified chronic kidney disease: Principal | ICD-10-CM | POA: Diagnosis present

## 2023-07-24 DIAGNOSIS — I080 Rheumatic disorders of both mitral and aortic valves: Secondary | ICD-10-CM | POA: Diagnosis not present

## 2023-07-24 DIAGNOSIS — G4733 Obstructive sleep apnea (adult) (pediatric): Secondary | ICD-10-CM | POA: Diagnosis not present

## 2023-07-24 DIAGNOSIS — K5730 Diverticulosis of large intestine without perforation or abscess without bleeding: Secondary | ICD-10-CM | POA: Diagnosis not present

## 2023-07-24 DIAGNOSIS — I480 Paroxysmal atrial fibrillation: Secondary | ICD-10-CM | POA: Diagnosis not present

## 2023-07-24 DIAGNOSIS — Z87442 Personal history of urinary calculi: Secondary | ICD-10-CM

## 2023-07-24 DIAGNOSIS — Z950 Presence of cardiac pacemaker: Secondary | ICD-10-CM | POA: Diagnosis present

## 2023-07-24 DIAGNOSIS — I214 Non-ST elevation (NSTEMI) myocardial infarction: Secondary | ICD-10-CM | POA: Diagnosis not present

## 2023-07-24 DIAGNOSIS — E039 Hypothyroidism, unspecified: Secondary | ICD-10-CM | POA: Diagnosis present

## 2023-07-24 DIAGNOSIS — K2930 Chronic superficial gastritis without bleeding: Secondary | ICD-10-CM | POA: Diagnosis not present

## 2023-07-24 DIAGNOSIS — E0590 Thyrotoxicosis, unspecified without thyrotoxic crisis or storm: Secondary | ICD-10-CM | POA: Diagnosis not present

## 2023-07-24 DIAGNOSIS — I10 Essential (primary) hypertension: Secondary | ICD-10-CM | POA: Diagnosis not present

## 2023-07-24 DIAGNOSIS — I5181 Takotsubo syndrome: Secondary | ICD-10-CM | POA: Clinically undetermined

## 2023-07-24 DIAGNOSIS — J45909 Unspecified asthma, uncomplicated: Secondary | ICD-10-CM | POA: Diagnosis not present

## 2023-07-24 DIAGNOSIS — R918 Other nonspecific abnormal finding of lung field: Secondary | ICD-10-CM | POA: Diagnosis not present

## 2023-07-24 DIAGNOSIS — Z87891 Personal history of nicotine dependence: Secondary | ICD-10-CM

## 2023-07-24 DIAGNOSIS — Z90710 Acquired absence of both cervix and uterus: Secondary | ICD-10-CM

## 2023-07-24 DIAGNOSIS — Z7984 Long term (current) use of oral hypoglycemic drugs: Secondary | ICD-10-CM

## 2023-07-24 DIAGNOSIS — K838 Other specified diseases of biliary tract: Secondary | ICD-10-CM | POA: Diagnosis not present

## 2023-07-24 DIAGNOSIS — N1832 Chronic kidney disease, stage 3b: Secondary | ICD-10-CM | POA: Diagnosis not present

## 2023-07-24 DIAGNOSIS — I5032 Chronic diastolic (congestive) heart failure: Secondary | ICD-10-CM | POA: Diagnosis not present

## 2023-07-24 DIAGNOSIS — F419 Anxiety disorder, unspecified: Secondary | ICD-10-CM | POA: Diagnosis present

## 2023-07-24 DIAGNOSIS — D122 Benign neoplasm of ascending colon: Secondary | ICD-10-CM | POA: Diagnosis not present

## 2023-07-24 DIAGNOSIS — I441 Atrioventricular block, second degree: Secondary | ICD-10-CM | POA: Diagnosis present

## 2023-07-24 DIAGNOSIS — I25119 Atherosclerotic heart disease of native coronary artery with unspecified angina pectoris: Secondary | ICD-10-CM | POA: Diagnosis present

## 2023-07-24 DIAGNOSIS — R011 Cardiac murmur, unspecified: Secondary | ICD-10-CM | POA: Diagnosis present

## 2023-07-24 DIAGNOSIS — Z951 Presence of aortocoronary bypass graft: Secondary | ICD-10-CM | POA: Diagnosis not present

## 2023-07-24 DIAGNOSIS — I21A1 Myocardial infarction type 2: Secondary | ICD-10-CM | POA: Diagnosis not present

## 2023-07-24 DIAGNOSIS — I4819 Other persistent atrial fibrillation: Secondary | ICD-10-CM

## 2023-07-24 DIAGNOSIS — I25708 Atherosclerosis of coronary artery bypass graft(s), unspecified, with other forms of angina pectoris: Secondary | ICD-10-CM

## 2023-07-24 DIAGNOSIS — R001 Bradycardia, unspecified: Secondary | ICD-10-CM

## 2023-07-24 DIAGNOSIS — Z96641 Presence of right artificial hip joint: Secondary | ICD-10-CM | POA: Diagnosis present

## 2023-07-24 DIAGNOSIS — D123 Benign neoplasm of transverse colon: Secondary | ICD-10-CM | POA: Diagnosis not present

## 2023-07-24 DIAGNOSIS — R0789 Other chest pain: Secondary | ICD-10-CM | POA: Diagnosis not present

## 2023-07-24 DIAGNOSIS — Z8249 Family history of ischemic heart disease and other diseases of the circulatory system: Secondary | ICD-10-CM | POA: Diagnosis not present

## 2023-07-24 DIAGNOSIS — Z7901 Long term (current) use of anticoagulants: Secondary | ICD-10-CM

## 2023-07-24 DIAGNOSIS — Z1152 Encounter for screening for COVID-19: Secondary | ICD-10-CM | POA: Diagnosis not present

## 2023-07-24 DIAGNOSIS — I482 Chronic atrial fibrillation, unspecified: Secondary | ICD-10-CM | POA: Diagnosis present

## 2023-07-24 DIAGNOSIS — Z83438 Family history of other disorder of lipoprotein metabolism and other lipidemia: Secondary | ICD-10-CM

## 2023-07-24 DIAGNOSIS — I5021 Acute systolic (congestive) heart failure: Secondary | ICD-10-CM | POA: Diagnosis not present

## 2023-07-24 DIAGNOSIS — D120 Benign neoplasm of cecum: Secondary | ICD-10-CM | POA: Diagnosis not present

## 2023-07-24 DIAGNOSIS — Z79899 Other long term (current) drug therapy: Secondary | ICD-10-CM

## 2023-07-24 DIAGNOSIS — K219 Gastro-esophageal reflux disease without esophagitis: Secondary | ICD-10-CM | POA: Diagnosis present

## 2023-07-24 DIAGNOSIS — M1990 Unspecified osteoarthritis, unspecified site: Secondary | ICD-10-CM | POA: Diagnosis present

## 2023-07-24 DIAGNOSIS — R7303 Prediabetes: Secondary | ICD-10-CM | POA: Diagnosis present

## 2023-07-24 DIAGNOSIS — R079 Chest pain, unspecified: Secondary | ICD-10-CM | POA: Diagnosis not present

## 2023-07-24 LAB — BASIC METABOLIC PANEL
Anion gap: 14 (ref 5–15)
BUN: 20 mg/dL (ref 8–23)
CO2: 20 mmol/L — ABNORMAL LOW (ref 22–32)
Calcium: 9.7 mg/dL (ref 8.9–10.3)
Chloride: 104 mmol/L (ref 98–111)
Creatinine, Ser: 1.55 mg/dL — ABNORMAL HIGH (ref 0.44–1.00)
GFR, Estimated: 34 mL/min — ABNORMAL LOW (ref >60)
Glucose, Bld: 106 mg/dL — ABNORMAL HIGH (ref 70–99)
Potassium: 4.3 mmol/L (ref 3.5–5.1)
Sodium: 138 mmol/L (ref 135–145)

## 2023-07-24 LAB — CBC
HCT: 46.1 % — ABNORMAL HIGH (ref 36.0–46.0)
Hemoglobin: 14.7 g/dL (ref 12.0–15.0)
MCH: 29.8 pg (ref 26.0–34.0)
MCHC: 31.9 g/dL (ref 30.0–36.0)
MCV: 93.3 fL (ref 80.0–100.0)
Platelets: 238 10*3/uL (ref 150–400)
RBC: 4.94 MIL/uL (ref 3.87–5.11)
RDW: 13.9 % (ref 11.5–15.5)
WBC: 9.5 10*3/uL (ref 4.0–10.5)
nRBC: 0 % (ref 0.0–0.2)

## 2023-07-24 LAB — RESP PANEL BY RT-PCR (RSV, FLU A&B, COVID)  RVPGX2
Influenza A by PCR: NEGATIVE
Influenza B by PCR: NEGATIVE
Resp Syncytial Virus by PCR: NEGATIVE
SARS Coronavirus 2 by RT PCR: NEGATIVE

## 2023-07-24 LAB — CUP PACEART INCLINIC DEVICE CHECK
Date Time Interrogation Session: 20241111093736
Implantable Lead Connection Status: 753985
Implantable Lead Connection Status: 753985
Implantable Lead Implant Date: 20210707
Implantable Lead Implant Date: 20210707
Implantable Lead Location: 753859
Implantable Lead Location: 753860
Implantable Lead Model: 5076
Implantable Lead Model: 5076
Implantable Lead Serial Number: 8264443
Implantable Pulse Generator Implant Date: 20210707

## 2023-07-24 LAB — TROPONIN I (HIGH SENSITIVITY)
Troponin I (High Sensitivity): 191 ng/L (ref <18)
Troponin I (High Sensitivity): 518 ng/L (ref <18)
Troponin I (High Sensitivity): 948 ng/L (ref <18)
Troponin I (High Sensitivity): 973 ng/L (ref <18)

## 2023-07-24 LAB — APTT: aPTT: 59 s — ABNORMAL HIGH (ref 24–36)

## 2023-07-24 LAB — BRAIN NATRIURETIC PEPTIDE: B Natriuretic Peptide: 270.5 pg/mL — ABNORMAL HIGH (ref 0.0–100.0)

## 2023-07-24 MED ORDER — LEVOTHYROXINE SODIUM 88 MCG PO TABS
88.0000 ug | ORAL_TABLET | Freq: Every day | ORAL | Status: DC
  Administered 2023-07-25: 88 ug via ORAL
  Filled 2023-07-24: qty 1

## 2023-07-24 MED ORDER — EMPAGLIFLOZIN 10 MG PO TABS
10.0000 mg | ORAL_TABLET | Freq: Every day | ORAL | Status: DC
Start: 2023-07-25 — End: 2023-07-26
  Administered 2023-07-25 – 2023-07-26 (×2): 10 mg via ORAL
  Filled 2023-07-24 (×2): qty 1

## 2023-07-24 MED ORDER — ONDANSETRON HCL 4 MG/2ML IJ SOLN: 4.0000 mg | Freq: Four times a day (QID) | INTRAMUSCULAR | Status: DC | PRN

## 2023-07-24 MED ORDER — MORPHINE SULFATE (PF) 4 MG/ML IV SOLN
4.0000 mg | Freq: Once | INTRAVENOUS | Status: AC
  Administered 2023-07-24: 4 mg via INTRAVENOUS
  Filled 2023-07-24: qty 1

## 2023-07-24 MED ORDER — ASPIRIN 300 MG RE SUPP: 300.0000 mg | RECTAL | Status: DC

## 2023-07-24 MED ORDER — NITROGLYCERIN 0.4 MG SL SUBL: 0.4000 mg | SUBLINGUAL_TABLET | SUBLINGUAL | Status: DC | PRN

## 2023-07-24 MED ORDER — HEPARIN (PORCINE) 25000 UT/250ML-% IV SOLN
1250.0000 [IU]/h | INTRAVENOUS | Status: DC
  Administered 2023-07-24: 1000 [IU]/h via INTRAVENOUS
  Filled 2023-07-24 (×2): qty 250

## 2023-07-24 MED ORDER — ASPIRIN 325 MG PO TABS
325.0000 mg | ORAL_TABLET | Freq: Every day | ORAL | Status: DC
  Administered 2023-07-24: 325 mg via ORAL
  Filled 2023-07-24: qty 1

## 2023-07-24 MED ORDER — NITROGLYCERIN 0.4 MG SL SUBL
0.4000 mg | SUBLINGUAL_TABLET | SUBLINGUAL | Status: DC | PRN
  Administered 2023-07-24: 0.4 mg via SUBLINGUAL
  Filled 2023-07-24: qty 1

## 2023-07-24 MED ORDER — PANTOPRAZOLE SODIUM 40 MG PO TBEC
40.0000 mg | DELAYED_RELEASE_TABLET | Freq: Every day | ORAL | Status: DC
  Administered 2023-07-24 – 2023-07-26 (×3): 40 mg via ORAL
  Filled 2023-07-24 (×3): qty 1

## 2023-07-24 MED ORDER — ACETAMINOPHEN 325 MG PO TABS: 650.0000 mg | ORAL_TABLET | ORAL | Status: DC | PRN

## 2023-07-24 MED ORDER — NITROGLYCERIN IN D5W 200-5 MCG/ML-% IV SOLN: 0.0000 ug/min | INTRAVENOUS | Status: DC

## 2023-07-24 MED ORDER — ASPIRIN 81 MG PO CHEW: 324.0000 mg | CHEWABLE_TABLET | ORAL | Status: DC

## 2023-07-24 MED ORDER — METOPROLOL TARTRATE 50 MG PO TABS
50.0000 mg | ORAL_TABLET | Freq: Two times a day (BID) | ORAL | Status: DC
  Administered 2023-07-24 – 2023-07-26 (×4): 50 mg via ORAL
  Filled 2023-07-24 (×2): qty 2
  Filled 2023-07-24 (×2): qty 1

## 2023-07-24 MED ORDER — IOPAMIDOL (ISOVUE-370) INJECTION 76%
60.0000 mL | Freq: Once | INTRAVENOUS | Status: AC | PRN
  Administered 2023-07-24: 60 mL via INTRAVENOUS

## 2023-07-24 MED ORDER — ASPIRIN 81 MG PO TBEC
81.0000 mg | DELAYED_RELEASE_TABLET | Freq: Every day | ORAL | Status: DC
  Administered 2023-07-25 – 2023-07-26 (×2): 81 mg via ORAL
  Filled 2023-07-24 (×2): qty 1

## 2023-07-24 MED ORDER — HEPARIN BOLUS VIA INFUSION
2000.0000 [IU] | Freq: Once | INTRAVENOUS | Status: AC
  Administered 2023-07-24: 2000 [IU] via INTRAVENOUS
  Filled 2023-07-24: qty 2000

## 2023-07-24 NOTE — H&P (Addendum)
Cardiology H&P   Patient ID: Samantha Short MRN: [id number]; DOB: 1945/11/26  Admit date: 07/24/2023 Date of Consult: 07/24/2023  PCP:  Mila Palmer, MD   [facility] Providers Cardiologist:  Verne Carrow, MD  Electrophysiologist:  Sherryl Manges, MD    Patient Profile:   Samantha Short is a 77 y.o. female with a hx of CAD s/p one-vessel CABG '10, hypertension, hyperlipidemia, GERD, mitral regurgitation, aortic stenosis, chronic HFpEF, high degree AV block status post Medtronic pacemaker, paroxysmal atrial fibrillation, CKD stage IIIb, OSA who is being seen 07/24/2023 for the evaluation of chest pain at the request of Dr. Karene Fry.  History of Present Illness:   Ms. Algeo is a 77 year old female with past medical history noted above.  She has been followed by Dr. Clifton James as well as Dr. Graciela Husbands as an outpatient.  She has a history of statin intolerance as well as being unable to tolerate HCTZ or Norvasc.  Previously stopped Zetia secondary to cost.  Her last cardiac catheterization was in 2021 with patent LIMA to LAD, native LAD with normal antegrade flow and thus there was competitive flow for the LIMA.  Medical therapy was recommended.  She had prominent V waves on PCWP suggestive of significant MR but echo showed only mild MR.  Echocardiogram 12/2022 with LVEF of 60 to 65%, normal RV, mild MR, mild aortic stenosis.  She is also known to have a history of paroxysmal atrial fibrillation which was initially diagnosed with her device prompting a transition from aspirin to Eliquis.  She was admitted 12/2022 with chest pain, palpitations and hypertensive urgency as well as A-fib RVR.  She was treated with IV diltiazem and converted to sinus rhythm.  Amiodarone and metoprolol were added.  She had an incidental mesenteric mass found requiring further workup.  03/2023 she underwent biopsy showing acute and chronic inflammation with that necrosis, fibrosis and dystrophic  calcification.  She was started on Jardiance 04/2023 in the setting of increased lower extremity edema on exam.  She was seen in the office 05/2023 and reported improvement of her lower extremity edema with the addition of Jardiance.  She was alternating her Lasix dosing of 20 mg with 40 mg every other day.  Also continued on Eliquis and metoprolol along with Zetia.  Seen in the office yesterday with Dr. Graciela Husbands for follow-up.  She reported an incredible amount of stress at home.  She complained of chest pain which was thought to be secondary to ventricular pacing but this was unable to be reproduced with high output atrial pacing ventricular pacing or dyssynchronous pacing.  AV delay was shortened and she reported feeling somewhat better with this adjustment.  Presented to the ED on 11/12 with complaints of chest pain. Reports over the past couple of months she has been having a this rhythmic chest pressure like sensation that it was initially though to be related to her PPM, but after seen by Dr. Graciela Husbands this was not felt to be the case. This morning she was woken from sleep at 4am with centralized sternal chest pain/pressure and shortness of breath. Says this pain was very different and more severe in nature which prompted her to come to the ED after calling the office for advice.    Labs in the ED showed sodium 138, potassium 4.3, creatinine 1.5, high-sensitivity troponin 191>>518, WBC 9.5, hemoglobin 14.7.  EKG shows sinus rhythm, AV pacing. CXR with increased interstitial markings concerning for mild edema. CT angio chest pending.  She  reports being under tremendous stress with her grandson, also has two siblings in hospice with one who recently passed.   Past Medical History:  Diagnosis Date   Anemia    pernicious   Anxiety    Arthritis    Asthma    Chronic diastolic CHF (congestive heart failure) (HCC)    a. 02/2011 Echo: EF 55-60%, Gr2 DD, Mild MR   Chronic kidney disease    stage 3    Complication of anesthesia    Coronary artery disease    a. s/p CABG x 1 2010:  LIMA->LAD.;  b. amdx for CP => LHC 07/04/12: LAD 70-80%, mid RCA 30%, LIMA-LAD patent with competitive flow limiting distal LAD filling, EF 70% with hyperdynamic LV function. Medical therapy continued.   Depression    Dyslipidemia    GERD (gastroesophageal reflux disease)    Headache    hx of migraines   Heart murmur    History of hiatal hernia    History of kidney stones    Hyperlipidemia    Hypertension    Hyperthyroidism    Hypothyroidism    Mild aortic stenosis    Mitral regurgitation    a. mild by echo 02/2011.   PAF (paroxysmal atrial fibrillation) (HCC)    Pneumonia    PONV (postoperative nausea and vomiting)    Pre-diabetes    Presence of permanent cardiac pacemaker    Sleep apnea    diagnosed 08-2022    Past Surgical History:  Procedure Laterality Date   ABDOMINAL HYSTERECTOMY  1980   CARDIAC CATHETERIZATION  07/27/09 & 07/28/09   CESAREAN SECTION     x 2   CORONARY ARTERY BYPASS GRAFT  08/03/2009   x1 using left internal mammary artery to distal left anterior  descending coronary artery.    GALLBLADDER SURGERY  2001   LAPAROSCOPY N/A 03/19/2023   Procedure: LAPAROSCOPY DIAGNOSTIC;  Surgeon: Fritzi Mandes, MD;  Location: [facility] OR;  Service: General;  Laterality: N/A;   LEFT HEART CATHETERIZATION WITH CORONARY/GRAFT ANGIOGRAM N/A 07/05/2012   Procedure: LEFT HEART CATHETERIZATION WITH Isabel Caprice;  Surgeon: Wendall Stade, MD;  Location: [facility] CATH LAB;  Service: Cardiovascular;  Laterality: N/A;   MASS EXCISION N/A 03/19/2023   Procedure: BIOPSY OF MESENTERIC MASS;  Surgeon: Fritzi Mandes, MD;  Location: [facility] OR;  Service: General;  Laterality: N/A;   PACEMAKER IMPLANT N/A 03/17/2020   Procedure: PACEMAKER IMPLANT;  Surgeon: Duke Salvia, MD;  Location: [facility] INVASIVE CV LAB;  Service: Cardiovascular;  Laterality: N/A;   RIGHT/LEFT HEART CATH AND CORONARY/GRAFT ANGIOGRAPHY N/A  03/17/2020   Procedure: RIGHT/LEFT HEART CATH AND CORONARY/GRAFT ANGIOGRAPHY;  Surgeon: Iran Ouch, MD;  Location: [facility] INVASIVE CV LAB;  Service: Cardiovascular;  Laterality: N/A;   SHOULDER SURGERY  1998 / 2001   from accident   rotator cuff   TOTAL HIP ARTHROPLASTY Right 08/30/2022   Procedure: TOTAL HIP ARTHROPLASTY ANTERIOR APPROACH;  Surgeon: Ollen Gross, MD;  Location: [facility] ORS;  Service: Orthopedics;  Laterality: Right;   VESICOVAGINAL FISTULA CLOSURE W/ TAH  1998     Home Medications:  Prior to Admission medications   Medication Sig Start Date End Date Taking? Authorizing Provider  acetaminophen (TYLENOL) 650 MG CR tablet Take 650 mg by mouth every 8 (eight) hours as needed for pain.    [provider]  apixaban (ELIQUIS) 5 MG TABS tablet Take 1 tablet (5 mg total) by mouth 2 (two) times daily. 06/18/23   Kathleene Hazel,  MD  cholecalciferol (VITAMIN D3) 25 MCG (1000 UNIT) tablet Take 1,000 Units by mouth daily.    [provider]  cyanocobalamin (,VITAMIN B-12,) 1000 MCG/ML injection Inject 1,000 mcg into the muscle every 30 (thirty) days.    [provider]  empagliflozin (JARDIANCE) 10 MG TABS tablet Take 1 tablet (10 mg total) by mouth daily before breakfast. 05/18/23   Kathleene Hazel, MD  famotidine (PEPCID) 20 MG tablet Take 20 mg by mouth daily as needed for heartburn or indigestion.    [provider]  furosemide (LASIX) 20 MG tablet Take 20 mg daily alternating with 40 mg every other day 06/19/23   Laurann Montana, PA-C  levothyroxine (SYNTHROID) 88 MCG tablet Take 88 mcg by mouth daily before breakfast.    [provider]  loratadine (CLARITIN) 10 MG tablet Take 10 mg by mouth daily.    [provider]  metoprolol tartrate (LOPRESSOR) 50 MG tablet Take 1 tablet (50 mg total) by mouth 2 (two) times daily. 07/23/23   Duke Salvia, MD  Multiple Vitamin (MULTIVITAMIN WITH MINERALS) TABS tablet Take 1 tablet  by mouth daily.    [provider]  Multiple Vitamins-Minerals (PRESERVISION AREDS 2) CAPS Take 1 capsule by mouth 2 (two) times daily.    [provider]  nitroGLYCERIN (NITROSTAT) 0.4 MG SL tablet PLACE 1 TABLET UNDER THE TONGUE EVERY 5 MINUTES AS NEEDED FOR CHEST PAIN 03/07/22   Kathleene Hazel, MD  pantoprazole (PROTONIX) 40 MG tablet Take 40 mg by mouth daily. 07/19/23   [provider]  Polyethylene Glycol 400 (BLINK TEARS OP) Place 1 drop into both eyes daily as needed (dry eyes).    [provider]  valsartan (DIOVAN) 80 MG tablet Take 1 tablet (80 mg total) by mouth daily. 05/03/23   Kathleene Hazel, MD    Inpatient Medications: Scheduled Meds:  aspirin  325 mg Oral Daily   Continuous Infusions:  heparin 1,000 Units/hr (07/24/23 1522)   PRN Meds:   Allergies:    Allergies  Allergen Reactions   Ceclor [Cefaclor] Other (See Comments)    Lost vision in eye   Hctz [Hydrochlorothiazide] Other (See Comments)    Renal insufficiency   Lipitor [Atorvastatin Calcium] Other (See Comments)    Increased A1C   Norvasc [Amlodipine] Other (See Comments)    Makes patient stiff   Penicillins Hives, Shortness Of Breath, Itching and Rash   Sulfa Antibiotics Other (See Comments)    CAUSES SHOCK   Milk-Related Compounds Diarrhea and Nausea And Vomiting   Statins Other (See Comments)    MYALGIAS AND WEAKNESS   Erythromycin Rash   Latex Rash   Levofloxacin Rash   Tetracyclines & Related Rash    Social History:   Social History   Socioeconomic History   Marital status: Widowed    Spouse name: Not on file   Number of children: 3   Years of education: Not on file   Highest education level: Not on file  Occupational History   Occupation: Retired- Engineer, site  Tobacco Use   Smoking status: Former    Current packs/day: 0.00    Types: Cigarettes    Start date: 07/13/2003    Quit date: 07/12/2010    Years since quitting: 13.0    Smokeless tobacco: Never   Tobacco comments:    Never a heavy smoker 2 a day  Vaping Use   Vaping status: Never Used  Substance and Sexual Activity   Alcohol  use: No   Drug use: No   Sexual activity: Not Currently  Other Topics Concern   Not on file  Social History Narrative   Not on file   Social Determinants of Health   Financial Resource Strain: Not on file  Food Insecurity: No Food Insecurity (01/03/2023)   Hunger Vital Sign    Worried About Running Out of Food in the Last Year: Never true    Ran Out of Food in the Last Year: Never true  Transportation Needs: No Transportation Needs (01/03/2023)   PRAPARE - Administrator, Civil Service (Medical): No    Lack of Transportation (Non-Medical): No  Physical Activity: Not on file  Stress: Not on file  Social Connections: Unknown (01/19/2022)   Received from [facility]   Social Network    Social Network: Not on file  Intimate Partner Violence: Not At Risk (01/03/2023)   Humiliation, Afraid, Rape, and Kick questionnaire    Fear of Current or Ex-Partner: No    Emotionally Abused: No    Physically Abused: No    Sexually Abused: No    Family History:    Family History  Problem Relation Age of Onset   Heart attack Mother    Heart disease Mother        had pacemaker   High blood pressure Mother    High Cholesterol Mother    Obesity Mother    Cancer Father    Heart attack Father    High blood pressure Father    High Cholesterol Father    Heart disease Father    Alcoholism Father    Obesity Father    Heart disease Sister    Lung cancer Sister        metastases to brain   Lung cancer Sister    Breast cancer Sister    Cancer Brother        pituitary cancer   Heart disease Brother    Hypertension Brother    Coronary artery disease Son    Thyroid disease Neg Hx      ROS:  Please see the history of present illness.   All other ROS reviewed and negative.     Physical Exam/Data:    Vitals:   07/24/23 1300 07/24/23 1400 07/24/23 1500 07/24/23 1524  BP: (!) 158/84 137/88 (!) 146/86   Pulse: 77 84 80   Resp: (!) 21 (!) 26 (!) 23   Temp:    97.7 F (36.5 C)  TempSrc:    Oral  SpO2: 96% 96% 98%   Weight:      Height:       No intake or output data in the 24 hours ending 07/24/23 1536    07/24/2023   12:02 PM 07/23/2023    5:12 PM 07/03/2023   11:48 AM  Last 3 Weights  Weight (lbs) 212 lb 212 lb 211 lb 4.8 oz  Weight (kg) 96.163 kg 96.163 kg 95.845 kg     Body mass index is 38.78 kg/m.  General:  Well nourished, well developed, in no acute distress; obese HEENT: normal Neck: no JVD Vascular: No carotid bruits; Distal pulses 2+ bilaterally Cardiac:  normal S1, S2; RRR; no murmur  Lungs:  clear to auscultation bilaterally, no wheezing, rhonchi or rales  Abd: soft, nontender, no hepatomegaly  Ext: no edema Musculoskeletal:  No deformities, BUE and BLE strength normal and equal Skin: warm and dry  Neuro:  CNs 2-12 intact, no focal abnormalities  noted Psych:  Normal affect   EKG:  The EKG was personally reviewed and demonstrates:  AV paced  Relevant CV Studies:  Cath: [facility] 03/2020: Small caliber LIMA to LAD still patent with competitive flow.  Proximal to mid LAD 20%.  Otherwise nonobstructive coronary disease in the left circumflex and RCA.  RHC-moderately elevated filling pressures and moderate pulmonary pretension with normal CO.  Prominent V waves on PCWP suggesting significant MR.  Minimal AOV gradient-P-P <10 mmHg Diagnostic Dominance: Right   Echo: 12/2022: Normal EF of 60 to 65%.  No RWMA.  Normal RV function and pressures.  Mild MR with moderate MAC.  AOV calcification with mild AS-mean AVG 17 mmHg.  Dilated IVC-RAP estimated roughly 8 mmHg.  Laboratory Data:  High Sensitivity Troponin:   Recent Labs  Lab 07/24/23 1040 07/24/23 1240  TROPONINIHS 191* 518*     Chemistry Recent Labs  Lab 07/24/23 1040  NA 138  K 4.3  CL 104  CO2  20*  GLUCOSE 106*  BUN 20  CREATININE 1.55*  CALCIUM 9.7  GFRNONAA 34*  ANIONGAP 14    No results for input(s): "PROT", "ALBUMIN", "AST", "ALT", "ALKPHOS", "BILITOT" in the last 168 hours. Lipids No results for input(s): "CHOL", "TRIG", "HDL", "LABVLDL", "LDLCALC", "CHOLHDL" in the last 168 hours.  Hematology Recent Labs  Lab 07/24/23 1040  WBC 9.5  RBC 4.94  HGB 14.7  HCT 46.1*  MCV 93.3  MCH 29.8  MCHC 31.9  RDW 13.9  PLT 238   Thyroid No results for input(s): "TSH", "FREET4" in the last 168 hours.  BNP Recent Labs  Lab 07/24/23 1040  BNP 270.5*    DDimer No results for input(s): "DDIMER" in the last 168 hours.   Radiology/Studies:  CT Angio Chest/Abd/Pel for Dissection W and/or Wo Contrast  Result Date: 07/24/2023 CLINICAL DATA:  Chest pain.  Acute aortic syndrome suspected. EXAM: CT ANGIOGRAPHY CHEST, ABDOMEN AND PELVIS TECHNIQUE: Non-contrast CT of the chest was initially obtained. Multidetector CT imaging through the chest, abdomen and pelvis was performed using the standard protocol during bolus administration of intravenous contrast. Multiplanar reconstructed images and MIPs were obtained and reviewed to evaluate the vascular anatomy. RADIATION DOSE REDUCTION: This exam was performed according to the departmental dose-optimization program which includes automated exposure control, adjustment of the mA and/or kV according to patient size and/or use of iterative reconstruction technique. CONTRAST:  60mL ISOVUE-370 IOPAMIDOL (ISOVUE-370) INJECTION 76% COMPARISON:  Chest CT 01/02/2023 FINDINGS: CTA CHEST FINDINGS Cardiovascular: Preferential opacification of the thoracic aorta. No evidence of thoracic aortic aneurysm or dissection. Normal heart size. No pericardial effusion. Main pulmonary arteries are patent. Great vessels are patent with typical three-vessel arch anatomy. Proximal bilateral vertebral arteries are patent. Left chest pacemaker with leads in the right atrium  and right ventricle. Mediastinum/Nodes: Numerous calcifications in the mediastinum and bilateral hila. No lymph node enlargement in the chest. Esophagus is unremarkable. No axillary lymph node enlargement. Lungs/Pleura: Multiple bilateral calcified granulomas. Mild interstitial septal thickening throughout the lungs. Mild thickening along the medial aspect of the right major fissure. No pleural effusions. Small patchy peripheral densities in the left upper lobe on image 54/7 are new. Mild thickening in the medial aspect of the left major fissure. Musculoskeletal: No acute bone abnormality. Review of the MIP images confirms the above findings. CTA ABDOMEN AND PELVIS FINDINGS VASCULAR Aorta: Normal caliber aorta without aneurysm, dissection, vasculitis or significant stenosis. Celiac: Patent without evidence of aneurysm, dissection, vasculitis or significant stenosis. Incidentally, there is an  accessory left hepatic artery originating from the left gastric artery. SMA: Patent without evidence of aneurysm, dissection, vasculitis or significant stenosis. Renals: 2 right renal arteries. Question 2 left renal arteries versus a left renal branch that originates near the origin. No evidence for aneurysm, dissection or significant stenosis involving the renal arteries. IMA: Patent without evidence of aneurysm, dissection, vasculitis or significant stenosis. Inflow: Patent without evidence of aneurysm, dissection, vasculitis or significant stenosis. Veins: No obvious venous abnormality within the limitations of this arterial phase study. Incidentally, there is a retroaortic left renal vein which is a normal variant. Review of the MIP images confirms the above findings. NON-VASCULAR Hepatobiliary: Cholecystectomy. Stable dilatation of the extrahepatic bile duct measuring up to 1.4 cm. Normal appearance of the liver. Pancreas: Unremarkable. No pancreatic ductal dilatation or surrounding inflammatory changes. Spleen: Multiple  small calcifications.  Spleen is normal for size. Adrenals/Urinary Tract: Normal adrenal glands. Normal appearance of the urinary bladder. Normal appearance of both kidneys without hydronephrosis. No suspicious renal lesion. Stomach/Bowel: Normal appearance of the stomach. Scattered colonic diverticula. No evidence for bowel obstruction. No acute bowel inflammation. Lymphatic: Again noted is a calcified mesenteric mass in the central abdomen that measures 4.6 x 2.8 cm on image 166/6 and similar to the prior examination. Stable mesenteric stranding in the left upper abdomen. No enlarged retroperitoneal lymph nodes. Reproductive: Status post hysterectomy. No adnexal masses. Other: Negative for free fluid. Negative for free air. Again noted is laxity along the anterior abdominal wall and there may be a small ventral hernia containing fat. Musculoskeletal: Right hip arthroplasty is located. Suspect old fracture involving the right sacral ala. Multilevel degenerative disc disease in the lumbar spine. No acute bone abnormality. Review of the MIP images confirms the above findings. IMPRESSION: 1. No acute aortic abnormality. Negative for aortic aneurysm or dissection. 2. Subtle patchy peripheral densities in left upper lung with bilateral septal thickening in both lungs. Findings are nonspecific but could represent asymmetric pulmonary edema or mild interstitial edema with an infectious or inflammatory process in the left upper lobe. 3. Stable appearance of the partially calcified mesenteric mass. 4. No acute abnormality in the abdomen or pelvis. Electronically Signed   By: Richarda Overlie M.D.   On: 07/24/2023 15:30   DG Chest Port 1 View  Result Date: 07/24/2023 CLINICAL DATA:  Chest pain. EXAM: PORTABLE CHEST 1 VIEW COMPARISON:  01/02/2023 FINDINGS: Heart size is stable with a left chest dual chamber cardiac pacemaker and median sternotomy wires. Scattered calcified granulomas in both lungs. Slightly increased  interstitial markings concerning for mild interstitial edema. No focal airspace disease or lung consolidation. Negative for a pneumothorax. IMPRESSION: Slightly increased interstitial markings concerning for mild interstitial edema. Electronically Signed   By: Richarda Overlie M.D.   On: 07/24/2023 15:00   CUP PACEART INCLINIC DEVICE CHECK  Result Date: 07/24/2023 Pt brought in for continued symptoms from previous three encounters. Physical discomfort notable and unable to determine if it is related to PPM w/ certainty. Reviewed w/ SK. We were unable to definitively replicate it. It appeared to happen, occasionally, with the VP timing out d/t MVP and AR followed by APVP; however, there were several times this happened and the patient did not notice. Insufficient data to rule in or out PPM being cause of discomfort. Changes made this session made per SK. MVP disabled. Programmed DDDR 70. Rate increased from 60 to 70. PAV delay decreased to from SAV delay decreased to from Ziopatch ordered w/  symptom activator.Raj Janus, RN    Assessment and Plan:   Samantha Short is a 77 y.o. female with a hx of CAD s/p one-vessel CABG '10, hypertension, hyperlipidemia, GERD, mitral regurgitation, aortic stenosis, chronic HFpEF, high degree AV block status post Medtronic pacemaker, paroxysmal atrial fibrillation, CKD stage IIIb, OSA who is being seen 07/24/2023 for the evaluation of chest pain at the request of Dr. Karene Fry.  Chest pain w/ Elevated Troponin - given presentation likely c/w NSTEMI CAD s/p 1v CABG (LIMA-LAD) -- reports she has been having some atypical chest pain for the past couple of months, but this morning her symptoms were very different and more anginal in nature -- hsTn 191>>518, EKG AV paced -> therefore unable to assess for ischemia. -- given SL NTG with resolution of pain -- presentation concerning for ACS though could be a stress induced event from her report. Will need  definitive cardiac catheterization -- NPO at midnight with plans for cath tomorrow -- check Echo - (with Echo - LV Gram not necessary)   Informed Consent   Shared Decision Making/Informed Consent The risks [stroke (1 in 1000), death (1 in 1000), kidney failure [usually temporary] (1 in 500), bleeding (1 in 200), allergic reaction [possibly serious] (1 in 200)], benefits (diagnostic support and management of coronary artery disease) and alternatives of a cardiac catheterization were discussed in detail with Ms. Ostrovsky and she is willing to proceed. Bryan Lemma, MD   HTN -- elevated in the ED -- on metoprolol and valsartan PTA, hold ARB with plans for cath & restart for d/c.  HLD -- reports issues with statin intolerance -- on zetia   Paroxsymal atrial fibrillation -- AV paced in the ED -- hold Eliquis, last dose with evening of 11/11  CKD stage 3B -- Cr 1.55 on admission, baseline 1.3-1.5 -- follow up BMET in am -- minimize contrast - No LV Gram.  High degree AVB s/p PPM -- MDT device, just seen in the office yesterday and device adjusted. Has been having some atypical pain that Dr. Graciela Husbands has been following but presenting symptoms now are very different -- may need to touch base with EP pending work up  Full Code as per discussion with patient  Risk Assessment/Risk Scores:     TIMI Risk Score for Unstable Angina or Non-ST Elevation MI:   The patient's TIMI risk score is 5, which indicates a 26% risk of all cause mortality, new or recurrent myocardial infarction or need for urgent revascularization in the next 14 days.{   CHA2DS2-VASc Score = 6  Last Eliquis this AM. -- hold PM dose (will be on IV Heparin)  This indicates a 9.7% annual risk of stroke. The patient's score is based upon: CHF History: 1 HTN History: 1 Diabetes History: 0 Stroke History: 0 Vascular Disease History: 1 Age Score: 2 Gender Score: 1   For questions or updates, please contact [facility] Please consult [email or web address] for contact info under    Signed, Laverda Page, NP  07/24/2023 3:36 PM    ATTENDING ATTESTATION  I have seen, examined and evaluated the patient this afternoon in the ER along with Laverda Page, NP-C.  After reviewing all the available data and chart, we discussed the patients laboratory, study & physical findings as well as symptoms in detail.  I agree with her findings, examination as well as impression recommendations as per our discussion.    Attending adjustments noted in italics.   Injury scenario with patient who has  known CAD but stabilization of LAD disease and had patent LIMA to LAD on most recent.  She now presents with what sounds like ACS type chest discomfort awakening her from sleep and troponin of 500.  Need to exclude ischemic CAD as etiology which would be ACS versus potentially stress-induced cardiomyopathy.  Thankfully, she is pain-free  Agree with plans for echocardiogram and cardiac catheterization in the morning.  Will run IV heparin overnight which will also cover her while she is off DOAC.  Has not tolerated statins in the past, if indeed we do find obstructive CAD, will need to find a way to be more aggressive lipid management given statin intolerance in the past.  Further plans based on echocardiogram Results.    Marykay Lex, MD, MS Bryan Lemma, M.D., M.S. Interventional Cardiologist  [facility]  Pager # [phone] Phone # [phone] [address] [place], [place] [address]

## 2023-07-24 NOTE — ED Triage Notes (Addendum)
Pt BIB [facility] for CP since this morning.  Pt has been burping since last night.  Thought it was indigestion but hasn't gone away.  Pt saw Cardiology yesterday and was placed on Metoprolol.  Per EMs pt states she was recently placed on some kind of heart monitor.  Pt has pacemaker.   180/90 98% 75 16  Pt received 324 ASAS and 1 nitroglycerin from EMS

## 2023-07-24 NOTE — ED Notes (Signed)
Lab called for BNP add on  

## 2023-07-24 NOTE — ED Notes (Signed)
EDP notified of critical lab value   Troponin 191

## 2023-07-24 NOTE — Telephone Encounter (Signed)
Pt called w/ new and worsened sustained constant CP since 0400 today accompanied by shortness of breath and nausea w/o vomiting. Pt advised to call 911 and go to the ED. AT notified.

## 2023-07-24 NOTE — ED Provider Notes (Signed)
[place] EMERGENCY DEPARTMENT AT [facility] Provider Note   CSN: [id number] Arrival date & time: 07/24/23  1019     History  Chief Complaint  Patient presents with   Chest Pain    Samantha Short is a 77 y.o. female.  HPI   77 year old female with medical history significant for CAD status post CABG LIMA to LAD, atrial fibrillation with RVR on anticoagulation, aortic stenosis, HLD, HTN, CHF, second-degree heart block now with permanent Medtronic AV pacemaker who presents to the emergency department with chest pain.  The patient was just seen in her cardiology clinic yesterday afternoon.  She followed with Dr. Graciela Husbands and was having pain that was thought to potentially be due to her pacing.  She has had sharp chest pain that has been intermittent since last Thursday when she had a colonoscopy.  Her pacemaker settings have been adjusted in clinic.  She continues to endorse intermittent sharp chest pain, currently describes 3 out of 10 pain in the left side of her chest that is nonradiating.  Endorses mild shortness of breath.  Endorses mild nausea, no vomiting, no fevers, does endorse chills, no genitourinary symptoms, no abdominal pain. On further discussion, the patient stated that the pain was sharp and radiated to her back.  Home Medications Prior to Admission medications   Medication Sig Start Date End Date Taking? Authorizing Provider  acetaminophen (TYLENOL) 650 MG CR tablet Take 650 mg by mouth every 8 (eight) hours as needed for pain.   Yes [provider]  apixaban (ELIQUIS) 5 MG TABS tablet Take 1 tablet (5 mg total) by mouth 2 (two) times daily. 06/18/23  Yes Kathleene Hazel, MD  cholecalciferol (VITAMIN D3) 25 MCG (1000 UNIT) tablet Take 1,000 Units by mouth daily.   Yes [provider]  cyanocobalamin (,VITAMIN B-12,) 1000 MCG/ML injection Inject 1,000 mcg into the muscle every 30 (thirty) days.   Yes [provider]  empagliflozin  (JARDIANCE) 10 MG TABS tablet Take 1 tablet (10 mg total) by mouth daily before breakfast. 05/18/23  Yes Kathleene Hazel, MD  famotidine (PEPCID) 20 MG tablet Take 20 mg by mouth daily as needed for heartburn or indigestion.   Yes [provider]  furosemide (LASIX) 20 MG tablet Take 20 mg daily alternating with 40 mg every other day Patient taking differently: Take 20-40 mg by mouth daily. Take 20 mg daily alternating with 40 mg every other day 06/19/23  Yes Dunn, Tacey Ruiz, PA-C  levothyroxine (SYNTHROID) 88 MCG tablet Take 88 mcg by mouth daily before breakfast.   Yes [provider]  loratadine (CLARITIN) 10 MG tablet Take 10 mg by mouth daily.   Yes [provider]  metoprolol tartrate (LOPRESSOR) 50 MG tablet Take 1 tablet (50 mg total) by mouth 2 (two) times daily. 07/23/23  Yes Duke Salvia, MD  Multiple Vitamin (MULTIVITAMIN WITH MINERALS) TABS tablet Take 1 tablet by mouth daily.   Yes [provider]  Multiple Vitamins-Minerals (PRESERVISION AREDS 2) CAPS Take 1 capsule by mouth 2 (two) times daily.   Yes [provider]  nitroGLYCERIN (NITROSTAT) 0.4 MG SL tablet PLACE 1 TABLET UNDER THE TONGUE EVERY 5 MINUTES AS NEEDED FOR CHEST PAIN 03/07/22  Yes Kathleene Hazel, MD  pantoprazole (PROTONIX) 40 MG tablet Take 40 mg by mouth daily. 07/19/23  Yes [provider]  Polyethylene Glycol 400 (BLINK TEARS OP) Place 1 drop into both eyes daily as needed (dry eyes).  Yes [provider]  valsartan (DIOVAN) 80 MG tablet Take 1 tablet (80 mg total) by mouth daily. 05/03/23  Yes Kathleene Hazel, MD      Allergies    Ceclor [cefaclor], Hctz [hydrochlorothiazide], Lipitor [atorvastatin calcium], Norvasc [amlodipine], Penicillins, Sulfa antibiotics, Milk-related compounds, Statins, Erythromycin, Latex, Levofloxacin, and Tetracyclines & related    Review of Systems   Review of Systems  All other systems reviewed and  are negative.   Physical Exam Updated Vital Signs BP (!) 146/86   Pulse 80   Temp 97.7 F (36.5 C) (Oral)   Resp (!) 23   Ht 5\' 2"  (1.575 m)   Wt 96.2 kg   SpO2 98%   BMI 38.78 kg/m  Physical Exam Vitals and nursing note reviewed.  Constitutional:      General: She is not in acute distress.    Appearance: She is well-developed.  HENT:     Head: Normocephalic and atraumatic.  Eyes:     Conjunctiva/sclera: Conjunctivae normal.  Cardiovascular:     Rate and Rhythm: Normal rate and regular rhythm.  Pulmonary:     Effort: Pulmonary effort is normal. No respiratory distress.     Breath sounds: Normal breath sounds.  Abdominal:     Palpations: Abdomen is soft.     Tenderness: There is no abdominal tenderness.  Musculoskeletal:        General: No swelling.     Cervical back: Neck supple.     Right lower leg: No edema.     Left lower leg: No edema.  Skin:    General: Skin is warm and dry.     Capillary Refill: Capillary refill takes less than 2 seconds.  Neurological:     Mental Status: She is alert.  Psychiatric:        Mood and Affect: Mood normal.     ED Results / Procedures / Treatments   Labs (all labs ordered are listed, but only abnormal results are displayed) Labs Reviewed  BASIC METABOLIC PANEL - Abnormal; Notable for the following components:      Result Value   CO2 20 (*)    Glucose, Bld 106 (*)    Creatinine, Ser 1.55 (*)    GFR, Estimated 34 (*)    All other components within normal limits  CBC - Abnormal; Notable for the following components:   HCT 46.1 (*)    All other components within normal limits  BRAIN NATRIURETIC PEPTIDE - Abnormal; Notable for the following components:   B Natriuretic Peptide 270.5 (*)    All other components within normal limits  TROPONIN I (HIGH SENSITIVITY) - Abnormal; Notable for the following components:   Troponin I (High Sensitivity) 191 (*)    All other components within normal limits  TROPONIN I (HIGH  SENSITIVITY) - Abnormal; Notable for the following components:   Troponin I (High Sensitivity) 518 (*)    All other components within normal limits  TROPONIN I (HIGH SENSITIVITY) - Abnormal; Notable for the following components:   Troponin I (High Sensitivity) 948 (*)    All other components within normal limits  RESP PANEL BY RT-PCR (RSV, FLU A&B, COVID)  RVPGX2  APTT  TROPONIN I (HIGH SENSITIVITY)    EKG EKG Interpretation Date/Time:  Tuesday July 24 2023 10:42:45 EST Ventricular Rate:  72 PR Interval:  177 QRS Duration:  148 QT Interval:  467 QTC Calculation: 512 R Axis:   -67  Text Interpretation: Atrial-ventricular dual-paced rhythm No further analysis attempted  due to paced rhythm Confirmed by Ernie Avena ([id number]) on 07/24/2023 11:58:45 AM  Radiology CT Angio Chest/Abd/Pel for Dissection W and/or Wo Contrast  Result Date: 07/24/2023 CLINICAL DATA:  Chest pain.  Acute aortic syndrome suspected. EXAM: CT ANGIOGRAPHY CHEST, ABDOMEN AND PELVIS TECHNIQUE: Non-contrast CT of the chest was initially obtained. Multidetector CT imaging through the chest, abdomen and pelvis was performed using the standard protocol during bolus administration of intravenous contrast. Multiplanar reconstructed images and MIPs were obtained and reviewed to evaluate the vascular anatomy. RADIATION DOSE REDUCTION: This exam was performed according to the departmental dose-optimization program which includes automated exposure control, adjustment of the mA and/or kV according to patient size and/or use of iterative reconstruction technique. CONTRAST:  60mL ISOVUE-370 IOPAMIDOL (ISOVUE-370) INJECTION 76% COMPARISON:  Chest CT 01/02/2023 FINDINGS: CTA CHEST FINDINGS Cardiovascular: Preferential opacification of the thoracic aorta. No evidence of thoracic aortic aneurysm or dissection. Normal heart size. No pericardial effusion. Main pulmonary arteries are patent. Great vessels are patent with typical three-vessel  arch anatomy. Proximal bilateral vertebral arteries are patent. Left chest pacemaker with leads in the right atrium and right ventricle. Mediastinum/Nodes: Numerous calcifications in the mediastinum and bilateral hila. No lymph node enlargement in the chest. Esophagus is unremarkable. No axillary lymph node enlargement. Lungs/Pleura: Multiple bilateral calcified granulomas. Mild interstitial septal thickening throughout the lungs. Mild thickening along the medial aspect of the right major fissure. No pleural effusions. Small patchy peripheral densities in the left upper lobe on image 54/7 are new. Mild thickening in the medial aspect of the left major fissure. Musculoskeletal: No acute bone abnormality. Review of the MIP images confirms the above findings. CTA ABDOMEN AND PELVIS FINDINGS VASCULAR Aorta: Normal caliber aorta without aneurysm, dissection, vasculitis or significant stenosis. Celiac: Patent without evidence of aneurysm, dissection, vasculitis or significant stenosis. Incidentally, there is an accessory left hepatic artery originating from the left gastric artery. SMA: Patent without evidence of aneurysm, dissection, vasculitis or significant stenosis. Renals: 2 right renal arteries. Question 2 left renal arteries versus a left renal branch that originates near the origin. No evidence for aneurysm, dissection or significant stenosis involving the renal arteries. IMA: Patent without evidence of aneurysm, dissection, vasculitis or significant stenosis. Inflow: Patent without evidence of aneurysm, dissection, vasculitis or significant stenosis. Veins: No obvious venous abnormality within the limitations of this arterial phase study. Incidentally, there is a retroaortic left renal vein which is a normal variant. Review of the MIP images confirms the above findings. NON-VASCULAR Hepatobiliary: Cholecystectomy. Stable dilatation of the extrahepatic bile duct measuring up to 1.4 cm. Normal appearance of the  liver. Pancreas: Unremarkable. No pancreatic ductal dilatation or surrounding inflammatory changes. Spleen: Multiple small calcifications.  Spleen is normal for size. Adrenals/Urinary Tract: Normal adrenal glands. Normal appearance of the urinary bladder. Normal appearance of both kidneys without hydronephrosis. No suspicious renal lesion. Stomach/Bowel: Normal appearance of the stomach. Scattered colonic diverticula. No evidence for bowel obstruction. No acute bowel inflammation. Lymphatic: Again noted is a calcified mesenteric mass in the central abdomen that measures 4.6 x 2.8 cm on image 166/6 and similar to the prior examination. Stable mesenteric stranding in the left upper abdomen. No enlarged retroperitoneal lymph nodes. Reproductive: Status post hysterectomy. No adnexal masses. Other: Negative for free fluid. Negative for free air. Again noted is laxity along the anterior abdominal wall and there may be a small ventral hernia containing fat. Musculoskeletal: Right hip arthroplasty is located. Suspect old fracture involving the right sacral ala. Multilevel degenerative disc disease in  the lumbar spine. No acute bone abnormality. Review of the MIP images confirms the above findings. IMPRESSION: 1. No acute aortic abnormality. Negative for aortic aneurysm or dissection. 2. Subtle patchy peripheral densities in left upper lung with bilateral septal thickening in both lungs. Findings are nonspecific but could represent asymmetric pulmonary edema or mild interstitial edema with an infectious or inflammatory process in the left upper lobe. 3. Stable appearance of the partially calcified mesenteric mass. 4. No acute abnormality in the abdomen or pelvis. Electronically Signed   By: Richarda Overlie M.D.   On: 07/24/2023 15:30   DG Chest Port 1 View  Result Date: 07/24/2023 CLINICAL DATA:  Chest pain. EXAM: PORTABLE CHEST 1 VIEW COMPARISON:  01/02/2023 FINDINGS: Heart size is stable with a left chest dual chamber  cardiac pacemaker and median sternotomy wires. Scattered calcified granulomas in both lungs. Slightly increased interstitial markings concerning for mild interstitial edema. No focal airspace disease or lung consolidation. Negative for a pneumothorax. IMPRESSION: Slightly increased interstitial markings concerning for mild interstitial edema. Electronically Signed   By: Richarda Overlie M.D.   On: 07/24/2023 15:00   CUP PACEART INCLINIC DEVICE CHECK  Result Date: 07/24/2023 Pt brought in for continued symptoms from previous three encounters. Physical discomfort notable and unable to determine if it is related to PPM w/ certainty. Reviewed w/ SK. We were unable to definitively replicate it. It appeared to happen, occasionally, with the VP timing out d/t MVP and AR followed by APVP; however, there were several times this happened and the patient did not notice. Insufficient data to rule in or out PPM being cause of discomfort. Changes made this session made per SK. MVP disabled. Programmed DDDR 70. Rate increased from 60 to 70. PAV delay decreased to from SAV delay decreased to from Ziopatch ordered w/ symptom activator.Raj Janus, RN   Procedures .Critical Care  Performed by: Ernie Avena, MD Authorized by: Ernie Avena, MD   Critical care provider statement:    Critical care time (minutes):  30   Critical care was time spent personally by me on the following activities:  Development of treatment plan with patient or surrogate, discussions with consultants, evaluation of patient's response to treatment, examination of patient, ordering and review of laboratory studies, ordering and review of radiographic studies, ordering and performing treatments and interventions, pulse oximetry, re-evaluation of patient's condition and review of old charts   Care discussed with: admitting provider       Medications Ordered in ED Medications  aspirin tablet 325 mg (325 mg Oral Given  07/24/23 1304)  heparin ADULT infusion 100 units/mL (25000 units/251mL) (1,000 Units/hr Intravenous New Bag/Given 07/24/23 1522)  morphine (PF) 4 MG/ML injection 4 mg (4 mg Intravenous Given 07/24/23 1206)  iopamidol (ISOVUE-370) 76 % injection 60 mL (60 mLs Intravenous Contrast Given 07/24/23 1403)  heparin bolus via infusion 2,000 Units (2,000 Units Intravenous Bolus from Bag 07/24/23 1522)    ED Course/ Medical Decision Making/ A&P Clinical Course as of 07/24/23 1618  Tue Jul 24, 2023  1229 Troponin I (High Sensitivity)(!!): 191 [JL]  1315 BP(!): 187/95 [JL]  1439 Troponin I (High Sensitivity)(!!): 518 [JL]    Clinical Course User Index [JL] Ernie Avena, MD                                 Medical Decision Making Amount and/or Complexity of Data Reviewed Labs: ordered. Decision-making details  documented in ED Course. Radiology: ordered.  Risk OTC drugs. Prescription drug management. Decision regarding hospitalization.     77 year old female with medical history significant for CAD status post CABG LIMA to LAD, atrial fibrillation with RVR on anticoagulation, aortic stenosis, HLD, HTN, CHF, second-degree heart block now with permanent Medtronic AV pacemaker who presents to the emergency department with chest pain.  The patient was just seen in her cardiology clinic yesterday afternoon.  She followed with Dr. Graciela Husbands and was having pain that was thought to potentially be due to her pacing.  She has had sharp chest pain that has been intermittent since last Thursday when she had a colonoscopy.  Her pacemaker settings have been adjusted in clinic.  She continues to endorse intermittent sharp chest pain, currently describes 3 out of 10 pain in the left side of her chest that is nonradiating.  Endorses mild shortness of breath.  Endorses mild nausea, no vomiting, no fevers, does endorse chills, no genitourinary symptoms, no abdominal pain. On further discussion, the patient stated that  the pain was sharp and radiated to her back.  On arrival, the patient was afebrile, not tachypneic, hypertensive BP 187/95, saturating 99% on room air.  EKG revealed a paced rhythm,, rate 72, no ischemic changes noted. Pt administered morphine for pain control.  Differential includes pacemaker related pain, ACS, less likely PE, dissection, pneumothorax, GERD, esophageal spasm.  CXR:  IMPRESSION:  Slightly increased interstitial markings concerning for mild  interstitial edema.    Labs: CBC without a leukocytosis or anemia, BMP with slightly elevated serum creatinine to 1.55 from the patient's baseline 1.3, no significant electrolyte abnormality, BNP pending, initial cardiac troponin elevated at 191.  Pacemaker interrogation: Medtronic called, AV pacer functioning as expected, AV rate 70, battery good, no arrhythmias recorded since July.  Patient's ongoing chest discomfort, now described as a squeezing sensation that also radiates to her back, cardiology consulted and CTA dissection study ordered.  Patient was administered nitroglycerin SL in addition to aspirin 325 mg.  CTA Dissection: IMPRESSION:  1. No acute aortic abnormality. Negative for aortic aneurysm or  dissection.  2. Subtle patchy peripheral densities in left upper lung with  bilateral septal thickening in both lungs. Findings are nonspecific  but could represent asymmetric pulmonary edema or mild interstitial  edema with an infectious or inflammatory process in the left upper  lobe.  3. Stable appearance of the partially calcified mesenteric mass.  4. No acute abnormality in the abdomen or pelvis.  Patient without symptoms of pneumonia, is afebrile and without a leukocytosis.  Fever likely developing pulmonary edema versus viral infectious etiology, COVID influenza PCR testing ordered.  Cardiology consult: Discussed with Laverda Page, NP.  [phone] Discussed with Dr. Elby Showers on call radiology, wet read no dissection, full  read pending. Repeat troponin 518, pt chest pain has improved however will plan to heparinize after discussion with cardiology bedside. Plan for cardiology admission.  Troponin continued to climb however the patient was chest pain-free on repeat assessment, subsequently admitted to the cardiology service on a heparin gtt.   Final Clinical Impression(s) / ED Diagnoses Final diagnoses:  ACS (acute coronary syndrome) [facility])    Rx / DC Orders ED Discharge Orders     None         Ernie Avena, MD 07/24/23 [phone]

## 2023-07-24 NOTE — Progress Notes (Signed)
PHARMACY - ANTICOAGULATION CONSULT NOTE  Pharmacy Consult for heparin infusion Indication: chest pain/ACS  Allergies  Allergen Reactions   Ceclor [Cefaclor] Other (See Comments)    Lost vision in eye   Hctz [Hydrochlorothiazide] Other (See Comments)    Renal insufficiency   Lipitor [Atorvastatin Calcium] Other (See Comments)    Increased A1C   Norvasc [Amlodipine] Other (See Comments)    Makes patient stiff   Penicillins Hives, Shortness Of Breath, Itching and Rash   Sulfa Antibiotics Other (See Comments)    CAUSES SHOCK   Statins Other (See Comments)    MYALGIAS AND WEAKNESS   Milk-Related Compounds Diarrhea and Nausea And Vomiting   Erythromycin Rash   Latex Rash   Levofloxacin Rash   Tetracyclines & Related Rash    Patient Measurements: Height: 5\' 2"  (157.5 cm) Weight: 96.2 kg (212 lb) IBW/kg (Calculated) : 50.1 Heparin Dosing Weight: 72.7 kg  Vital Signs: Temp: 97.6 F (36.4 C) (11/12 1100) Temp Source: Oral (11/12 1100) BP: 173/103 (11/12 1200) Pulse Rate: 72 (11/12 1200)  Labs: Recent Labs    07/24/23 1040 07/24/23 1240  HGB 14.7  --   HCT 46.1*  --   PLT 238  --   CREATININE 1.55*  --   TROPONINIHS 191* 518*    Estimated Creatinine Clearance: 32.9 mL/min (A) (by C-G formula based on SCr of 1.55 mg/dL (H)).   Medical History: Past Medical History:  Diagnosis Date   Anemia    pernicious   Anxiety    Arthritis    Asthma    Chronic diastolic CHF (congestive heart failure) (HCC)    a. 02/2011 Echo: EF 55-60%, Gr2 DD, Mild MR   Chronic kidney disease    stage 3   Complication of anesthesia    Coronary artery disease    a. s/p CABG x 1 2010:  LIMA->LAD.;  b. amdx for CP => LHC 07/04/12: LAD 70-80%, mid RCA 30%, LIMA-LAD patent with competitive flow limiting distal LAD filling, EF 70% with hyperdynamic LV function. Medical therapy continued.   Depression    Dyslipidemia    GERD (gastroesophageal reflux disease)    Headache    hx of migraines    Heart murmur    History of hiatal hernia    History of kidney stones    Hyperlipidemia    Hypertension    Hyperthyroidism    Hypothyroidism    Mild aortic stenosis    Mitral regurgitation    a. mild by echo 02/2011.   PAF (paroxysmal atrial fibrillation) (HCC)    Pneumonia    PONV (postoperative nausea and vomiting)    Pre-diabetes    Presence of permanent cardiac pacemaker    Sleep apnea    diagnosed 08-2022   Assessment: 77yoF with chief complaint chest pain, last seen by cardiology 11/11 with significant cardiac history including CABG and CHF. CBC within normal limits, Apixaban noted in dispense history, patient confirmed she is still taking this for history of paroxysmal atrial fibrillation with last dose 11/11 evening so will need to monitor with aPTT levels until heparin level correlates. hsTnI 518, BNP 270.5.   Goal of Therapy:  Heparin level 0.3-0.7 units/ml aPTT 66-102 seconds Monitor platelets by anticoagulation protocol: Yes   Plan:  Half bolus with 2000 units Start heparin infusion at 1000 units/hr Check 8 hour aPTT Monitor heparin level, aPTT, and CBC daily and PRN F/u eventual transition back to Eliquis  Rutherford Nail, PharmD PGY2 Critical Care Pharmacy Resident 07/24/2023,2:44 PM

## 2023-07-24 NOTE — Progress Notes (Unsigned)
Enrolled patient for a 3 day Zio XT monitor to be mailed to patients home  

## 2023-07-24 NOTE — Progress Notes (Signed)
PHARMACY - ANTICOAGULATION CONSULT NOTE  Pharmacy Consult for heparin infusion Indication: chest pain/ACS  Allergies  Allergen Reactions   Ceclor [Cefaclor] Other (See Comments)    Lost vision in eye   Hctz [Hydrochlorothiazide] Other (See Comments)    Renal insufficiency   Lipitor [Atorvastatin Calcium] Other (See Comments)    Increased A1C   Norvasc [Amlodipine] Other (See Comments)    Makes patient stiff   Penicillins Hives, Shortness Of Breath, Itching and Rash   Sulfa Antibiotics Other (See Comments)    CAUSES SHOCK   Milk-Related Compounds Diarrhea and Nausea And Vomiting   Statins Other (See Comments)    MYALGIAS AND WEAKNESS   Erythromycin Rash   Latex Rash   Levofloxacin Rash   Tetracyclines & Related Rash    Patient Measurements: Height: 5\' 2"  (157.5 cm) Weight: 96.2 kg (212 lb) IBW/kg (Calculated) : 50.1 Heparin Dosing Weight: 72.7 kg  Vital Signs: Temp: 97.4 F (36.3 C) (11/12 2044) Temp Source: Oral (11/12 1524) BP: 104/64 (11/12 2200) Pulse Rate: 77 (11/12 2200)  Labs: Recent Labs    07/24/23 1040 07/24/23 1240 07/24/23 1449 07/24/23 1637 07/24/23 2231  HGB 14.7  --   --   --   --   HCT 46.1*  --   --   --   --   PLT 238  --   --   --   --   APTT  --   --   --   --  59*  CREATININE 1.55*  --   --   --   --   TROPONINIHS 191* 518* 948* 973*  --     Estimated Creatinine Clearance: 32.9 mL/min (A) (by C-G formula based on SCr of 1.55 mg/dL (H)).   Medical History: Past Medical History:  Diagnosis Date   Anemia    pernicious   Anxiety    Arthritis    Asthma    Chronic diastolic CHF (congestive heart failure) (HCC)    a. 02/2011 Echo: EF 55-60%, Gr2 DD, Mild MR   Chronic kidney disease    stage 3   Complication of anesthesia    Coronary artery disease    a. s/p CABG x 1 2010:  LIMA->LAD.;  b. amdx for CP => LHC 07/04/12: LAD 70-80%, mid RCA 30%, LIMA-LAD patent with competitive flow limiting distal LAD filling, EF 70% with hyperdynamic  LV function. Medical therapy continued.   Depression    Dyslipidemia    GERD (gastroesophageal reflux disease)    Headache    hx of migraines   Heart murmur    History of hiatal hernia    History of kidney stones    Hyperlipidemia    Hypertension    Hyperthyroidism    Hypothyroidism    Mild aortic stenosis    Mitral regurgitation    a. mild by echo 02/2011.   PAF (paroxysmal atrial fibrillation) (HCC)    Pneumonia    PONV (postoperative nausea and vomiting)    Pre-diabetes    Presence of permanent cardiac pacemaker    Sleep apnea    diagnosed 08-2022   Assessment: Samantha Short is a 77 y.o. year old female admitted on 07/24/2023 with concern for NSTEMI. Apixaban prior to admission (LD 11/11 PM). Pharmacy consulted to dose heparin.  Heparin level 59, subtherapeutic Heparin drip currently running at 1000 units/hr, MAR documentation with brief pause earlier tonight. Nurse noted no s/sx of bleeding or issues with infusion.   Goal of Therapy:  Heparin  level 0.3-0.7 units/ml aPTT 66-102 seconds Monitor platelets by anticoagulation protocol: Yes   Plan:  Increase heparin infusion 1100 units/hr Check 8 hour aPTT Monitor heparin level and aPTT until correlates  F/u plan for cardiac cath and transition back to Eliquis  Thank you for allowing pharmacy to participate in this patient's care.  Marja Kays, PharmD Emergency Medicine Clinical Pharmacist 07/24/2023,11:16 PM

## 2023-07-25 ENCOUNTER — Other Ambulatory Visit (HOSPITAL_COMMUNITY): Payer: Medicare HMO

## 2023-07-25 ENCOUNTER — Other Ambulatory Visit: Payer: Medicare HMO

## 2023-07-25 ENCOUNTER — Encounter (HOSPITAL_COMMUNITY): Payer: Self-pay | Admitting: Cardiology

## 2023-07-25 ENCOUNTER — Encounter (HOSPITAL_COMMUNITY)
Admission: EM | Disposition: A | Discharge status: Home / Self Care | Payer: Self-pay | Source: Home / Self Care | Attending: Cardiology

## 2023-07-25 DIAGNOSIS — I4819 Other persistent atrial fibrillation: Secondary | ICD-10-CM

## 2023-07-25 DIAGNOSIS — I214 Non-ST elevation (NSTEMI) myocardial infarction: Secondary | ICD-10-CM | POA: Diagnosis not present

## 2023-07-25 DIAGNOSIS — I5032 Chronic diastolic (congestive) heart failure: Secondary | ICD-10-CM

## 2023-07-25 DIAGNOSIS — I2510 Atherosclerotic heart disease of native coronary artery without angina pectoris: Secondary | ICD-10-CM | POA: Diagnosis not present

## 2023-07-25 DIAGNOSIS — R7989 Other specified abnormal findings of blood chemistry: Secondary | ICD-10-CM | POA: Diagnosis not present

## 2023-07-25 DIAGNOSIS — R0789 Other chest pain: Secondary | ICD-10-CM | POA: Diagnosis not present

## 2023-07-25 DIAGNOSIS — Z951 Presence of aortocoronary bypass graft: Secondary | ICD-10-CM

## 2023-07-25 DIAGNOSIS — I10 Essential (primary) hypertension: Secondary | ICD-10-CM

## 2023-07-25 DIAGNOSIS — I25118 Atherosclerotic heart disease of native coronary artery with other forms of angina pectoris: Secondary | ICD-10-CM

## 2023-07-25 HISTORY — PX: LEFT HEART CATH AND CORS/GRAFTS ANGIOGRAPHY: CATH118250

## 2023-07-25 LAB — BASIC METABOLIC PANEL
Anion gap: 8 (ref 5–15)
BUN: 19 mg/dL (ref 8–23)
CO2: 22 mmol/L (ref 22–32)
Calcium: 9.2 mg/dL (ref 8.9–10.3)
Chloride: 107 mmol/L (ref 98–111)
Creatinine, Ser: 1.35 mg/dL — ABNORMAL HIGH (ref 0.44–1.00)
GFR, Estimated: 40 mL/min — ABNORMAL LOW (ref >60)
Glucose, Bld: 87 mg/dL (ref 70–99)
Potassium: 4.6 mmol/L (ref 3.5–5.1)
Sodium: 137 mmol/L (ref 135–145)

## 2023-07-25 LAB — CBC
HCT: 42.4 % (ref 36.0–46.0)
Hemoglobin: 13.8 g/dL (ref 12.0–15.0)
MCH: 30 pg (ref 26.0–34.0)
MCHC: 32.5 g/dL (ref 30.0–36.0)
MCV: 92.2 fL (ref 80.0–100.0)
Platelets: 180 10*3/uL (ref 150–400)
RBC: 4.6 MIL/uL (ref 3.87–5.11)
RDW: 13.8 % (ref 11.5–15.5)
WBC: 9.1 10*3/uL (ref 4.0–10.5)
nRBC: 0 % (ref 0.0–0.2)

## 2023-07-25 LAB — LIPID PANEL
Cholesterol: 191 mg/dL (ref 0–200)
HDL: 61 mg/dL (ref >40)
LDL Cholesterol: 118 mg/dL — ABNORMAL HIGH (ref 0–99)
Total CHOL/HDL Ratio: 3.1 {ratio}
Triglycerides: 61 mg/dL (ref <150)
VLDL: 12 mg/dL (ref 0–40)

## 2023-07-25 LAB — APTT: aPTT: 58 s — ABNORMAL HIGH (ref 24–36)

## 2023-07-25 SURGERY — LEFT HEART CATH AND CORS/GRAFTS ANGIOGRAPHY
Anesthesia: LOCAL

## 2023-07-25 MED ORDER — LIDOCAINE HCL (PF) 1 % IJ SOLN
INTRAMUSCULAR | Status: AC
Start: 2023-07-25 — End: ?
  Filled 2023-07-25: qty 30

## 2023-07-25 MED ORDER — ONDANSETRON HCL 4 MG/2ML IJ SOLN
INTRAMUSCULAR | Status: AC
  Filled 2023-07-25: qty 2

## 2023-07-25 MED ORDER — SODIUM CHLORIDE 0.9 % WEIGHT BASED INFUSION: 1.0000 mL/kg/h | INTRAVENOUS | Status: DC

## 2023-07-25 MED ORDER — MIDAZOLAM HCL 2 MG/2ML IJ SOLN
INTRAMUSCULAR | Status: DC | PRN
  Administered 2023-07-25: 1 mg via INTRAVENOUS

## 2023-07-25 MED ORDER — SODIUM CHLORIDE 0.9% FLUSH
3.0000 mL | Freq: Two times a day (BID) | INTRAVENOUS | Status: DC
  Administered 2023-07-26: 3 mL via INTRAVENOUS

## 2023-07-25 MED ORDER — HEPARIN (PORCINE) IN NACL 1000-0.9 UT/500ML-% IV SOLN
INTRAVENOUS | Status: DC | PRN
  Administered 2023-07-25 (×2): 500 mL

## 2023-07-25 MED ORDER — IOHEXOL 350 MG/ML SOLN
INTRAVENOUS | Status: DC | PRN
  Administered 2023-07-25: 70 mL

## 2023-07-25 MED ORDER — SODIUM CHLORIDE 0.9% FLUSH: 3.0000 mL | INTRAVENOUS | Status: DC | PRN

## 2023-07-25 MED ORDER — APIXABAN 5 MG PO TABS
5.0000 mg | ORAL_TABLET | Freq: Two times a day (BID) | ORAL | Status: DC
  Administered 2023-07-26: 5 mg via ORAL
  Filled 2023-07-25: qty 1

## 2023-07-25 MED ORDER — ONDANSETRON HCL 4 MG/2ML IJ SOLN
INTRAMUSCULAR | Status: DC | PRN
  Administered 2023-07-25: 4 mg via INTRAVENOUS

## 2023-07-25 MED ORDER — SODIUM CHLORIDE 0.9 % IV SOLN: INTRAVENOUS | Status: AC

## 2023-07-25 MED ORDER — LIDOCAINE HCL (PF) 1 % IJ SOLN
INTRAMUSCULAR | Status: DC | PRN
  Administered 2023-07-25: 2 mL

## 2023-07-25 MED ORDER — HEPARIN SODIUM (PORCINE) 1000 UNIT/ML IJ SOLN
INTRAMUSCULAR | Status: AC
Start: 2023-07-25 — End: ?
  Filled 2023-07-25: qty 10

## 2023-07-25 MED ORDER — HEPARIN SODIUM (PORCINE) 1000 UNIT/ML IJ SOLN
INTRAMUSCULAR | Status: DC | PRN
  Administered 2023-07-25: 5000 [IU] via INTRAVENOUS

## 2023-07-25 MED ORDER — SODIUM CHLORIDE 0.9 % WEIGHT BASED INFUSION: 3.0000 mL/kg/h | INTRAVENOUS | Status: DC

## 2023-07-25 MED ORDER — SODIUM CHLORIDE 0.9 % IV SOLN: 250.0000 mL | INTRAVENOUS | Status: DC | PRN

## 2023-07-25 MED ORDER — FENTANYL CITRATE (PF) 100 MCG/2ML IJ SOLN
INTRAMUSCULAR | Status: DC | PRN
  Administered 2023-07-25 (×2): 25 ug via INTRAVENOUS

## 2023-07-25 MED ORDER — MIDAZOLAM HCL 2 MG/2ML IJ SOLN
INTRAMUSCULAR | Status: AC
  Filled 2023-07-25: qty 2

## 2023-07-25 MED ORDER — FENTANYL CITRATE (PF) 100 MCG/2ML IJ SOLN
INTRAMUSCULAR | Status: AC
  Filled 2023-07-25: qty 2

## 2023-07-25 MED ORDER — VERAPAMIL HCL 2.5 MG/ML IV SOLN
INTRAVENOUS | Status: AC
Start: 2023-07-25 — End: ?
  Filled 2023-07-25: qty 2

## 2023-07-25 MED ORDER — VERAPAMIL HCL 2.5 MG/ML IV SOLN: INTRAVENOUS | Status: DC | PRN

## 2023-07-25 SURGICAL SUPPLY — 11 items
CATH INFINITI 5FR JL4 (CATHETERS) IMPLANT
CATH INFINITI AMBI 5FR TG (CATHETERS) IMPLANT
CATH INFINITI JR4 5F (CATHETERS) IMPLANT
DEVICE RAD COMP TR BAND LRG (VASCULAR PRODUCTS) IMPLANT
ELECT DEFIB PAD ADLT CADENCE (PAD) IMPLANT
GLIDESHEATH SLEND SS 6F .021 (SHEATH) IMPLANT
GUIDEWIRE INQWIRE 1.5J.035X260 (WIRE) IMPLANT
INQWIRE 1.5J .035X260CM (WIRE) ×1
KIT HEART LEFT (KITS) IMPLANT
PACK CARDIAC CATHETERIZATION (CUSTOM PROCEDURE TRAY) ×1 IMPLANT
TUBING CIL FLEX 10 FLL-RA (TUBING) IMPLANT

## 2023-07-25 NOTE — Progress Notes (Signed)
PHARMACY - ANTICOAGULATION CONSULT NOTE  Pharmacy Consult for heparin infusion Indication: chest pain/ACS  Allergies  Allergen Reactions   Ceclor [Cefaclor] Other (See Comments)    Lost vision in eye   Hctz [Hydrochlorothiazide] Other (See Comments)    Renal insufficiency   Lipitor [Atorvastatin Calcium] Other (See Comments)    Increased A1C   Norvasc [Amlodipine] Other (See Comments)    Makes patient stiff   Penicillins Hives, Shortness Of Breath, Itching and Rash   Sulfa Antibiotics Other (See Comments)    CAUSES SHOCK   Milk-Related Compounds Diarrhea and Nausea And Vomiting   Statins Other (See Comments)    MYALGIAS AND WEAKNESS   Erythromycin Rash   Latex Rash   Levofloxacin Rash   Tetracyclines & Related Rash    Patient Measurements: Height: 5\' 2"  (157.5 cm) Weight: 96.2 kg (212 lb) IBW/kg (Calculated) : 50.1 Heparin Dosing Weight: 72.7 kg  Vital Signs: Temp: 97.5 F (36.4 C) (11/13 0725) Temp Source: Oral (11/13 0725) BP: 125/76 (11/13 0700) Pulse Rate: 73 (11/13 0700)  Labs: Recent Labs    07/24/23 1040 07/24/23 1240 07/24/23 1449 07/24/23 1637 07/24/23 2231 07/25/23 0450  HGB 14.7  --   --   --   --  13.8  HCT 46.1*  --   --   --   --  42.4  PLT 238  --   --   --   --  180  APTT  --   --   --   --  59* 58*  CREATININE 1.55*  --   --   --   --  1.35*  TROPONINIHS 191* 518* 948* 973*  --   --     Estimated Creatinine Clearance: 37.7 mL/min (A) (by C-G formula based on SCr of 1.35 mg/dL (H)).   Medical History: Past Medical History:  Diagnosis Date   Anemia    pernicious   Anxiety    Arthritis    Asthma    Chronic diastolic CHF (congestive heart failure) (HCC)    a. 02/2011 Echo: EF 55-60%, Gr2 DD, Mild MR   Chronic kidney disease    stage 3   Complication of anesthesia    Coronary artery disease    a. s/p CABG x 1 2010:  LIMA->LAD.;  b. amdx for CP => LHC 07/04/12: LAD 70-80%, mid RCA 30%, LIMA-LAD patent with competitive flow limiting  distal LAD filling, EF 70% with hyperdynamic LV function. Medical therapy continued.   Depression    Dyslipidemia    GERD (gastroesophageal reflux disease)    Headache    hx of migraines   Heart murmur    History of hiatal hernia    History of kidney stones    Hyperlipidemia    Hypertension    Hyperthyroidism    Hypothyroidism    Mild aortic stenosis    Mitral regurgitation    a. mild by echo 02/2011.   PAF (paroxysmal atrial fibrillation) (HCC)    Pneumonia    PONV (postoperative nausea and vomiting)    Pre-diabetes    Presence of permanent cardiac pacemaker    Sleep apnea    diagnosed 08-2022   Assessment: Samantha Short is a 77 y.o. year old female admitted on 07/24/2023 with concern for NSTEMI. Apixaban prior to admission (LD 11/11 PM). Pharmacy consulted to dose heparin.  Heparin level 58, subtherapeutic Heparin drip currently running at 1100 units/hr, No overt s/sx of bleeding or issues with infusion.   Goal of Therapy:  Heparin level 0.3-0.7 units/ml aPTT 66-102 seconds Monitor platelets by anticoagulation protocol: Yes   Plan:  Increase heparin infusion 1250 units/hr Check 8 hour aPTT/ heparin level Monitor heparin level and aPTT until correlates  F/u plan for cardiac cath and transition back to Eliquis  Ruben Im, PharmD Clinical Pharmacist 07/25/2023 7:39 AM Please check AMION for all [facility] numbers

## 2023-07-25 NOTE — Progress Notes (Signed)
Patient Name: [name] Date of Encounter: 07/25/2023 [facility] Cardiologist: Verne Carrow, MD   Assessment & Plan .     Samantha Short is a 77 y.o. female with a hx of CAD s/p one-vessel CABG '10, hypertension, hyperlipidemia, GERD, mitral regurgitation, aortic stenosis, chronic HFpEF, high degree AV block status post [employer] pacemaker, paroxysmal atrial fibrillation, CKD stage IIIb, OSA who was admitted 07/24/2023 for the evaluation of chest pain with elevated troponins.  Somewhat atypical features of discomfort led to some adjustment of her pacemaker settings, she subsequently then awoke with chest pain that lasted prolonged duration.  This is more concerning for angina and was therefore referred for cardiac catheterization revealing minimal disease.  Principal Problem:   Chest pain, mid sternal Active Problems:   Coronary artery disease involving native heart   Essential hypertension   Chronic diastolic CHF (congestive heart failure) (HCC)   Pacemaker - MDT   Persistent atrial fibrillation (HCC)   NSTEMI (non-ST elevated myocardial infarction) (HCC)   Elevated troponin   Hx of CABG  Principal Problem:   Chest pain, mid sternal =>  Elevated troponin  Active Problems:   Coronary artery disease involving native heart /  Hx of CABG   NSTEMI (non-ST elevated myocardial infarction) (HCC) - Macrovascular CAD R/o -> No obstructive lesion seen on cardiac catheterization with no major change from Couple years ago.  This would suggest that troponin elevation may be more related to her notable social stress.  Could potentially stress-induced cardiomyopathy.   2D echo pending.. =>       Essential hypertension   Chronic diastolic CHF (congestive heart failure) (HCC)-euvolemic. BP pretty well-controlled: Continue modest dose Lopressor.      Persistent atrial fibrillation (HCC)/ Pacemaker - MDT (CHA2DS2-VASc score 5) On beta-blocker for rate control.   Will  restart Eliquis in the morning post cath [place] Will need to see what the echo results looks like and based on that determine disposition.   Interval Summary  .    Patient seen in the Cath Lab, not actively having chest pain.  Seemingly at relatively comfortable.  No dyspnea.  Just anxious about cath.  Also anxious about family issues.  Vital Signs .    Vitals:   07/25/23 1710 07/25/23 1759 07/25/23 2005 07/25/23 2006  BP: (!) 153/115 107/63 (!) 104/59 (!) 104/59  Pulse: 71 77 74 74  Resp:   20   Temp:   98.3 F (36.8 C)   TempSrc:   Oral   SpO2:   100%   Weight:      Height:        Intake/Output Summary (Last 24 hours) at 07/25/2023 2303 Last data filed at 07/25/2023 1600 Gross per 24 hour  Intake 240 ml  Output --  Net 240 ml      07/24/2023   12:02 PM 07/23/2023    5:12 PM 07/03/2023   11:48 AM  Last 3 Weights  Weight (lbs) 212 lb 212 lb 211 lb 4.8 oz  Weight (kg) 96.163 kg 96.163 kg 95.845 kg      Telemetry/ECG/Cardiology Studies    Paced- Personally Reviewed  Cardiac Cath 07/25/2023: Proximal-mid LAD 30%, ostial OM1 20%.  Small caliber LIMA but patent.  No competitive flow.  Mostly provides antegrade flow.  Mildly elevated LVEDP   Physical Exam .   GEN: No acute distress.   Neck: No JVD Cardiac: RRR, normal S1 and S2.  No murmurs, rubs, or gallops.  Respiratory: Clear  to auscultation bilaterally.  Labored GI: Soft, nontender, non-distended  MS: No edema; radial cath site clean dry and intact   For questions or updates, please contact [facility] Please consult [email or web address] for contact info under        Signed, Bryan Lemma, MD

## 2023-07-25 NOTE — Plan of Care (Signed)
  Problem: Education: Goal: Understanding of cardiac disease, CV risk reduction, and recovery process will improve Outcome: Progressing Goal: Individualized Educational Video(s) Outcome: Progressing   Problem: Activity: Goal: Ability to tolerate increased activity will improve Outcome: Progressing   Problem: Cardiac: Goal: Ability to achieve and maintain adequate cardiovascular perfusion will improve Outcome: Progressing   Problem: Health Behavior/Discharge Planning: Goal: Ability to safely manage health-related needs after discharge will improve Outcome: Progressing   Problem: Education: Goal: Knowledge of General Education information will improve Description: Including pain rating scale, medication(s)/side effects and non-pharmacologic comfort measures Outcome: Progressing   Problem: Health Behavior/Discharge Planning: Goal: Ability to manage health-related needs will improve Outcome: Progressing   Problem: Clinical Measurements: Goal: Ability to maintain clinical measurements within normal limits will improve Outcome: Progressing Goal: Will remain free from infection Outcome: Progressing Goal: Diagnostic test results will improve Outcome: Progressing Goal: Respiratory complications will improve Outcome: Progressing Goal: Cardiovascular complication will be avoided Outcome: Progressing   Problem: Activity: Goal: Risk for activity intolerance will decrease Outcome: Progressing   Problem: Nutrition: Goal: Adequate nutrition will be maintained Outcome: Progressing   Problem: Coping: Goal: Level of anxiety will decrease Outcome: Progressing   Problem: Elimination: Goal: Will not experience complications related to bowel motility Outcome: Progressing Goal: Will not experience complications related to urinary retention Outcome: Progressing   Problem: Pain Management: Goal: General experience of comfort will improve Outcome: Progressing   Problem:  Safety: Goal: Ability to remain free from injury will improve Outcome: Progressing   Problem: Skin Integrity: Goal: Risk for impaired skin integrity will decrease Outcome: Progressing   Problem: Education: Goal: Understanding of CV disease, CV risk reduction, and recovery process will improve Outcome: Progressing Goal: Individualized Educational Video(s) Outcome: Progressing   Problem: Activity: Goal: Ability to return to baseline activity level will improve Outcome: Progressing   Problem: Cardiovascular: Goal: Ability to achieve and maintain adequate cardiovascular perfusion will improve Outcome: Progressing Goal: Vascular access site(s) Level 0-1 will be maintained Outcome: Progressing   Problem: Health Behavior/Discharge Planning: Goal: Ability to safely manage health-related needs after discharge will improve Outcome: Progressing

## 2023-07-25 NOTE — Interval H&P Note (Signed)
History and Physical Interval Note:  07/25/2023 2:11 PM  Samantha Short  has presented today for surgery, with the diagnosis of chest pain.  The various methods of treatment have been discussed with the patient and family. After consideration of risks, benefits and other options for treatment, the patient has consented to  Procedure(s): LEFT HEART CATH AND CORS/GRAFTS ANGIOGRAPHY (N/A) as a surgical intervention.  The patient's history has been reviewed, patient examined, no change in status, stable for surgery.  I have reviewed the patient's chart and labs.  Questions were answered to the patient's satisfaction.     Lorine Bears

## 2023-07-26 ENCOUNTER — Inpatient Hospital Stay (HOSPITAL_COMMUNITY): Payer: Medicare HMO

## 2023-07-26 ENCOUNTER — Other Ambulatory Visit (HOSPITAL_COMMUNITY): Payer: Self-pay

## 2023-07-26 ENCOUNTER — Telehealth (HOSPITAL_COMMUNITY): Payer: Self-pay | Admitting: *Deleted

## 2023-07-26 ENCOUNTER — Encounter: Payer: Self-pay | Admitting: Cardiovascular Disease

## 2023-07-26 ENCOUNTER — Encounter (HOSPITAL_COMMUNITY): Payer: Self-pay | Admitting: Cardiovascular Disease

## 2023-07-26 DIAGNOSIS — I5181 Takotsubo syndrome: Secondary | ICD-10-CM | POA: Diagnosis not present

## 2023-07-26 DIAGNOSIS — R0789 Other chest pain: Secondary | ICD-10-CM | POA: Diagnosis not present

## 2023-07-26 DIAGNOSIS — I10 Essential (primary) hypertension: Secondary | ICD-10-CM | POA: Diagnosis not present

## 2023-07-26 DIAGNOSIS — I5021 Acute systolic (congestive) heart failure: Secondary | ICD-10-CM

## 2023-07-26 DIAGNOSIS — R079 Chest pain, unspecified: Secondary | ICD-10-CM

## 2023-07-26 LAB — CBC
HCT: 38.9 % (ref 36.0–46.0)
Hemoglobin: 12.5 g/dL (ref 12.0–15.0)
MCH: 29.9 pg (ref 26.0–34.0)
MCHC: 32.1 g/dL (ref 30.0–36.0)
MCV: 93.1 fL (ref 80.0–100.0)
Platelets: 193 10*3/uL (ref 150–400)
RBC: 4.18 MIL/uL (ref 3.87–5.11)
RDW: 13.9 % (ref 11.5–15.5)
WBC: 8.5 10*3/uL (ref 4.0–10.5)
nRBC: 0 % (ref 0.0–0.2)

## 2023-07-26 LAB — ECHOCARDIOGRAM COMPLETE
AR max vel: 1.19 cm2
AV Area VTI: 1.24 cm2
AV Area mean vel: 1.06 cm2
AV Mean grad: 21 mm[Hg]
AV Peak grad: 32 mm[Hg]
Ao pk vel: 2.83 m/s
Height: 62 in
MV M vel: 4.79 m/s
MV Peak grad: 91.8 mm[Hg]
Radius: 0.3 cm
S' Lateral: 2.5 cm
Weight: 3392 [oz_av]

## 2023-07-26 LAB — LIPOPROTEIN A (LPA): Lipoprotein (a): 128.6 nmol/L — ABNORMAL HIGH (ref <75.0)

## 2023-07-26 MED ORDER — EZETIMIBE 10 MG PO TABS
10.0000 mg | ORAL_TABLET | Freq: Every day | ORAL | 1 refills | Status: DC
Start: 2023-07-26 — End: 2023-07-26

## 2023-07-26 MED ORDER — EZETIMIBE 10 MG PO TABS
10.0000 mg | ORAL_TABLET | Freq: Every day | ORAL | 1 refills | Status: DC
  Filled 2023-07-26: qty 90, 90d supply, fill #0

## 2023-07-26 MED ORDER — PERFLUTREN LIPID MICROSPHERE
1.0000 mL | INTRAVENOUS | Status: DC | PRN
  Administered 2023-07-26: 2 mL via INTRAVENOUS

## 2023-07-26 NOTE — Plan of Care (Signed)
Problem: Education: Goal: Understanding of cardiac disease, CV risk reduction, and recovery process will improve 07/26/2023 0717 by Barnabas Lister, RN Outcome: Progressing 07/26/2023 0717 by Barnabas Lister, RN Outcome: Progressing Goal: Individualized Educational Video(s) 07/26/2023 0717 by Barnabas Lister, RN Outcome: Progressing 07/26/2023 0717 by Barnabas Lister, RN Outcome: Progressing   Problem: Activity: Goal: Ability to tolerate increased activity will improve 07/26/2023 0717 by Barnabas Lister, RN Outcome: Progressing 07/26/2023 0717 by Barnabas Lister, RN Outcome: Progressing   Problem: Cardiac: Goal: Ability to achieve and maintain adequate cardiovascular perfusion will improve 07/26/2023 0717 by Barnabas Lister, RN Outcome: Progressing 07/26/2023 0717 by Barnabas Lister, RN Outcome: Progressing   Problem: Health Behavior/Discharge Planning: Goal: Ability to safely manage health-related needs after discharge will improve 07/26/2023 0717 by Barnabas Lister, RN Outcome: Progressing 07/26/2023 0717 by Barnabas Lister, RN Outcome: Progressing   Problem: Education: Goal: Knowledge of General Education information will improve Description: Including pain rating scale, medication(s)/side effects and non-pharmacologic comfort measures 07/26/2023 0717 by Barnabas Lister, RN Outcome: Progressing 07/26/2023 0717 by Barnabas Lister, RN Outcome: Progressing   Problem: Health Behavior/Discharge Planning: Goal: Ability to manage health-related needs will improve 07/26/2023 0717 by Barnabas Lister, RN Outcome: Progressing 07/26/2023 0717 by Barnabas Lister, RN Outcome: Progressing   Problem: Clinical Measurements: Goal: Ability to maintain clinical measurements within normal limits will improve 07/26/2023 0717 by Barnabas Lister, RN Outcome: Progressing 07/26/2023 0717 by Barnabas Lister, RN Outcome: Progressing Goal: Will remain  free from infection 07/26/2023 0717 by Barnabas Lister, RN Outcome: Progressing 07/26/2023 0717 by Barnabas Lister, RN Outcome: Progressing Goal: Diagnostic test results will improve 07/26/2023 0717 by Barnabas Lister, RN Outcome: Progressing 07/26/2023 0717 by Barnabas Lister, RN Outcome: Progressing Goal: Respiratory complications will improve 07/26/2023 0717 by Barnabas Lister, RN Outcome: Progressing 07/26/2023 0717 by Barnabas Lister, RN Outcome: Progressing Goal: Cardiovascular complication will be avoided 07/26/2023 0717 by Barnabas Lister, RN Outcome: Progressing 07/26/2023 0717 by Barnabas Lister, RN Outcome: Progressing   Problem: Activity: Goal: Risk for activity intolerance will decrease 07/26/2023 0717 by Barnabas Lister, RN Outcome: Progressing 07/26/2023 0717 by Barnabas Lister, RN Outcome: Progressing   Problem: Nutrition: Goal: Adequate nutrition will be maintained 07/26/2023 0717 by Barnabas Lister, RN Outcome: Progressing 07/26/2023 0717 by Barnabas Lister, RN Outcome: Progressing   Problem: Coping: Goal: Level of anxiety will decrease 07/26/2023 0717 by Barnabas Lister, RN Outcome: Progressing 07/26/2023 0717 by Barnabas Lister, RN Outcome: Progressing   Problem: Elimination: Goal: Will not experience complications related to bowel motility 07/26/2023 0717 by Barnabas Lister, RN Outcome: Progressing 07/26/2023 0717 by Barnabas Lister, RN Outcome: Progressing Goal: Will not experience complications related to urinary retention 07/26/2023 0717 by Barnabas Lister, RN Outcome: Progressing 07/26/2023 0717 by Barnabas Lister, RN Outcome: Progressing   Problem: Pain Management: Goal: General experience of comfort will improve 07/26/2023 0717 by Barnabas Lister, RN Outcome: Progressing 07/26/2023 0717 by Barnabas Lister, RN Outcome: Progressing   Problem: Safety: Goal: Ability to remain free from injury  will improve 07/26/2023 0717 by Barnabas Lister, RN Outcome: Progressing 07/26/2023 0717 by Barnabas Lister, RN Outcome: Progressing   Problem: Skin Integrity: Goal: Risk for impaired skin integrity will decrease 07/26/2023 0717 by Barnabas Lister, RN Outcome: Progressing 07/26/2023 0717 by Barnabas Lister, RN Outcome: Progressing   Problem: Education: Goal: Understanding of CV  disease, CV risk reduction, and recovery process will improve 07/26/2023 0717 by Barnabas Lister, RN Outcome: Progressing 07/26/2023 0717 by Barnabas Lister, RN Outcome: Progressing Goal: Individualized Educational Video(s) 07/26/2023 0717 by Barnabas Lister, RN Outcome: Progressing 07/26/2023 0717 by Barnabas Lister, RN Outcome: Progressing   Problem: Activity: Goal: Ability to return to baseline activity level will improve 07/26/2023 0717 by Barnabas Lister, RN Outcome: Progressing 07/26/2023 0717 by Barnabas Lister, RN Outcome: Progressing   Problem: Cardiovascular: Goal: Ability to achieve and maintain adequate cardiovascular perfusion will improve 07/26/2023 0717 by Barnabas Lister, RN Outcome: Progressing 07/26/2023 0717 by Barnabas Lister, RN Outcome: Progressing Goal: Vascular access site(s) Level 0-1 will be maintained 07/26/2023 0717 by Barnabas Lister, RN Outcome: Progressing 07/26/2023 0717 by Barnabas Lister, RN Outcome: Progressing   Problem: Health Behavior/Discharge Planning: Goal: Ability to safely manage health-related needs after discharge will improve 07/26/2023 0717 by Barnabas Lister, RN Outcome: Progressing 07/26/2023 0717 by Barnabas Lister, RN Outcome: Progressing

## 2023-07-26 NOTE — Progress Notes (Signed)
Discharge instructions given. Patient verbalized understanding and all questions were answered.  ?

## 2023-07-26 NOTE — Plan of Care (Signed)
  Problem: Education: Goal: Understanding of cardiac disease, CV risk reduction, and recovery process will improve Outcome: Progressing Goal: Individualized Educational Video(s) Outcome: Progressing   Problem: Activity: Goal: Ability to tolerate increased activity will improve Outcome: Progressing   Problem: Cardiac: Goal: Ability to achieve and maintain adequate cardiovascular perfusion will improve Outcome: Progressing   Problem: Health Behavior/Discharge Planning: Goal: Ability to safely manage health-related needs after discharge will improve Outcome: Progressing   Problem: Education: Goal: Knowledge of General Education information will improve Description: Including pain rating scale, medication(s)/side effects and non-pharmacologic comfort measures Outcome: Progressing   Problem: Health Behavior/Discharge Planning: Goal: Ability to manage health-related needs will improve Outcome: Progressing   Problem: Clinical Measurements: Goal: Ability to maintain clinical measurements within normal limits will improve Outcome: Progressing Goal: Will remain free from infection Outcome: Progressing Goal: Diagnostic test results will improve Outcome: Progressing Goal: Respiratory complications will improve Outcome: Progressing Goal: Cardiovascular complication will be avoided Outcome: Progressing   Problem: Activity: Goal: Risk for activity intolerance will decrease Outcome: Progressing   Problem: Nutrition: Goal: Adequate nutrition will be maintained Outcome: Progressing   Problem: Coping: Goal: Level of anxiety will decrease Outcome: Progressing   Problem: Elimination: Goal: Will not experience complications related to bowel motility Outcome: Progressing Goal: Will not experience complications related to urinary retention Outcome: Progressing   Problem: Pain Management: Goal: General experience of comfort will improve Outcome: Progressing   Problem:  Safety: Goal: Ability to remain free from injury will improve Outcome: Progressing   Problem: Skin Integrity: Goal: Risk for impaired skin integrity will decrease Outcome: Progressing   Problem: Education: Goal: Understanding of CV disease, CV risk reduction, and recovery process will improve Outcome: Progressing Goal: Individualized Educational Video(s) Outcome: Progressing   Problem: Activity: Goal: Ability to return to baseline activity level will improve Outcome: Progressing   Problem: Cardiovascular: Goal: Ability to achieve and maintain adequate cardiovascular perfusion will improve Outcome: Progressing Goal: Vascular access site(s) Level 0-1 will be maintained Outcome: Progressing   Problem: Health Behavior/Discharge Planning: Goal: Ability to safely manage health-related needs after discharge will improve Outcome: Progressing

## 2023-07-26 NOTE — Discharge Summary (Addendum)
Discharge Summary    Patient ID: Samantha Short MRN: [id number]; DOB: [birth year]  Admit date: 07/24/2023 Discharge date: 07/26/2023  PCP:  Samantha Palmer, MD   Samantha Short Cardiologist:  Samantha Carrow, MD  Electrophysiologist:  Samantha Manges, MD    Discharge Diagnoses    Principal Problem:   Takotsubo cardiomyopathy Active Problems:   Coronary artery disease involving native heart with angina pectoris (HCC)   Hyperlipidemia with target LDL less than 70   Essential hypertension   Chest pain, mid sternal   Chronic diastolic CHF (congestive heart failure) (HCC)   Pacemaker - MDT   Chronic atrial fibrillation with RVR (HCC)   Persistent atrial fibrillation (HCC)   Elevated troponin   Hx of CABG  Diagnostic Studies/Procedures    Cath: 07/25/2023:   Prox LAD to Mid LAD lesion is 30% stenosed.  Ost LM lesion is 20% stenosed.   LIMA-LAD is small caliber but patent.  No disease.  Competitive flow noted.Marland Kitchen  LVEDP was mildly elevated. IMPRESSION 1.  Mild nonobstructive coronary artery disease.  Patent LIMA to LAD but the graft mostly has antegrade and retrograde flow given that native LAD does not seem to have obstructive disease. 2.  Left ventricular angiography was not diagnostic.     Recommendations: No culprit is identified for small non-ST elevation myocardial infarction which could be due to stress-induced cardiomyopathy.  Will obtain an echocardiogram.  Continue medical therapy for coronary artery disease.  Diagnostic Dominance: Right   Echo: 07/26/2023:  MPRESSIONS   1. Biventricular systolic dysfunction with relative sparing of the base. Findings concerning for Takotsubo cardiomyopathy.   2. Left ventricular ejection fraction, by estimation, is 25 to 30%. The left ventricle has severely decreased function. The left ventricle  demonstrates regional wall motion abnormalities (see scoring diagram/findings for description). There is mild  concentric left ventricular hypertrophy. Left ventricular diastolic parameters are consistent with Grade II diastolic dysfunction (pseudonormalization).  LV Wall Scoring:  The entire apex is akinetic. The mid anteroseptal segment, mid inferolateral segment, mid anterolateral segment, mid inferoseptal segment, mid anterior segment, and mid inferior segment are hypokinetic. The basal anteroseptal segment, basal inferolateral segment, basal anterolateral segment, basal anterior segment, basal inferior segment, and basal inferoseptal segment are normal.   3. Mid to apical RV hypokinesis. Right ventricular systolic function is moderately reduced. The right ventricular size is normal. There is mildly  elevated pulmonary artery systolic pressure.   4. The mitral valve is normal in structure. Mild mitral valve regurgitation. No evidence of mitral stenosis.   5. Tricuspid valve regurgitation is mild to moderate.   6. The aortic valve is tricuspid. There is moderate calcification of the aortic valve. There is moderate thickening of the aortic valve. Aortic  valve regurgitation is trivial. No aortic stenosis is present. Aortic valve area, by VTI measures 1.24 cm. Aortic valve mean gradient measures 21.0 mmHg. Aortic valve Vmax measures 2.83 m/s.   7. The inferior vena cava is normal in size with greater than 50% respiratory variability, suggesting right atrial pressure of 3 mmHg.     Right Ventricle: Mid to apical RV hypokinesis. The right ventricular size  is normal. No increase in right ventricular wall thickness. Right  ventricular systolic function is moderately reduced. There is mildly  elevated pulmonary artery systolic pressure.  The tricuspid regurgitant velocity is 2.83 m/s, and with an assumed right  atrial pressure of 8 mmHg, the estimated right ventricular systolic  pressure is 40.0 mmHg.   Left Atrium:  Left atrial size was normal in size.   Right Atrium: Right atrial size was normal in  size.   Pericardium: There is no evidence of pericardial effusion.   Mitral Valve: The mitral valve is normal in structure. Mild mitral valve  regurgitation. No evidence of mitral valve stenosis.   Tricuspid Valve: The tricuspid valve is normal in structure. Tricuspid  valve regurgitation is mild to moderate. No evidence of tricuspid  stenosis.   Aortic Valve: The aortic valve is tricuspid. There is moderate  calcification of the aortic valve. There is moderate thickening of the  aortic valve. Aortic valve regurgitation is trivial. No aortic stenosis is  present. Aortic valve mean gradient measures  21.0 mmHg. Aortic valve peak gradient measures 32.0 mmHg. Aortic valve  area, by VTI measures 1.24 cm.   Pulmonic Valve: The pulmonic valve was normal in structure. Pulmonic valve  regurgitation is mild. No evidence of pulmonic stenosis.   Aorta: The aortic root is normal in size and structure.   Venous: The inferior vena cava is normal in size with greater than 50%  respiratory variability, suggesting right atrial pressure of 3 mmHg.   IAS/Shunts: No atrial level shunt detected by color flow Doppler.   Additional Comments: A device lead is visualized.     _____________   History of Present Illness     Samantha Short is a 77 y.o. female with a hx of CAD s/p one-vessel CABG '10, hypertension, hyperlipidemia, GERD, mitral regurgitation, aortic stenosis, chronic HFpEF, high degree AV block status post [employer] pacemaker, paroxysmal atrial fibrillation, CKD stage IIIb, OSA who is being seen 07/24/2023 for the evaluation of chest pain at the request of Dr. Karene Short.    She has been followed by Dr. Clifton Short as well as Dr. Graciela Short as an outpatient.  She has a history of statin intolerance as well as being unable to tolerate HCTZ or Norvasc.  Previously stopped Zetia secondary to cost.  Her last cardiac catheterization was in 2021 with patent LIMA to LAD, native LAD with normal antegrade flow  and thus there was competitive flow for the LIMA.  Medical therapy was recommended.  She had prominent V waves on PCWP suggestive of significant MR but echo showed only mild MR.  Echocardiogram 12/2022 with LVEF of 60 to 65%, normal RV, mild MR, mild aortic stenosis.  She is also known to have a history of paroxysmal atrial fibrillation which was initially diagnosed with her device prompting a transition from aspirin to Eliquis.  She was admitted 12/2022 with chest pain, palpitations and hypertensive urgency as well as A-fib RVR.  She was treated with IV diltiazem and converted to sinus rhythm.  Amiodarone and metoprolol were added.  She had an incidental mesenteric mass found requiring further workup.  03/2023 she underwent biopsy showing acute and chronic inflammation with that necrosis, fibrosis and dystrophic calcification.  She was started on Jardiance 04/2023 in the setting of increased lower extremity edema on exam.  She was seen in the office 05/2023 and reported improvement of her lower extremity edema with the addition of Jardiance.  She was alternating her Lasix dosing of 20 mg with 40 mg every other day.  Also continued on Eliquis and metoprolol along with Zetia.   Seen in the office yesterday with Dr. Graciela Short for follow-up.  She reported an incredible amount of stress at home.  She complained of chest pain which was thought to be secondary to ventricular pacing but this was unable to be reproduced  with high output atrial pacing ventricular pacing or dyssynchronous pacing.  AV delay was shortened and she reported feeling somewhat better with this adjustment.   Presented to the ED on 11/12 with complaints of chest pain. Reports over the past couple of months she has been having a this rhythmic chest pressure like sensation that it was initially though to be related to her PPM, but after seen by Dr. Graciela Short this was not felt to be the case. This morning she was woken from sleep at 4am with centralized sternal  chest pain/pressure and shortness of breath. Says this pain was very different and more severe in nature which prompted her to come to the ED after calling the office for advice.     Labs in the ED showed sodium 138, potassium 4.3, creatinine 1.5, high-sensitivity troponin 191>>518, WBC 9.5, hemoglobin 14.7.  EKG shows sinus rhythm, AV pacing. CXR with increased interstitial markings concerning for mild edema. CT angio chest pending.   She reports being under tremendous stress with her grandson, also has two siblings in hospice with one who recently passed.     Hospital Course     Chest pain  NSTEMI (type II) CAD s/p 1v CABG (LIMA-LAD) Takotsubo cardiomyopathy HFrEF (acute systolic heart failure-NYHA class II) -- reports she has been having some atypical chest pain for the past couple of months, but the morning of presentation her symptoms were very different and more anginal in nature -- hsTn 191>>518, EKG AV paced -> therefore unable to assess for ischemia. -- underwent cardiac catheterization noted above with obstructive CAD, unchanged from previous. -- echo showed biventricular systolic dysfunction consistent with Takotsubo cardiomyopathy, LVEF of 25-30%, to apical RV hypokinesis with RV function moderately reduced, trivial AS -- GDMT: Continue metoprolol 25mg  BID, valsartan 80mg  daily, jardiance. Blood pressures are soft, marginal at times. Unable to further titrate meds -- suspect her EF will improve    HTN -- on metoprolol and valsartan   HLD -- reports issues with statin intolerance -- on zetia    Paroxsymal atrial fibrillation -- AV paced in the ED -- resumed on Eliquis post cath   CKD stage 3B -- Cr 1.55 on admission, Cr 1.3 at discharge    High degree AVB s/p PPM -- MDT device, just seen in the office yesterday and device adjusted. Has been having some atypical pain that Dr. Graciela Short has been following but presenting symptoms now are very different -- follow up with EP  outpatient   PHYSICAL EXAM General: Well developed, well nourished, female appearing in no acute distress. Head: Normocephalic, atraumatic.  Neck: Supple without bruits, JVD. Lungs:  Resp regular and unlabored, CTA. Heart: RRR, S1, S2, no S3, S4, or murmur; no rub. Abdomen: Soft, non-tender, non-distended with normoactive bowel sounds. No hepatomegaly. No rebound/guarding. No obvious abdominal masses. Extremities: No clubbing, cyanosis, edema. Distal pedal pulses are 2+ bilaterally. Right cath site stable without bruising or hematoma Neuro: Alert and oriented X 3. Moves all extremities spontaneously. Psych: Normal affect.  Patient seen by Dr. Herbie Baltimore and deemed stable for discharge. Follow up in the office arranged. Medications sent to the [facility] pharmacy  The patient will be scheduled for a TOC follow up appointment in 10-14 days.  A message has been sent to the [facility] and Scheduling Pool at the office where the patient should be seen for follow up.  _____________  Discharge Vitals Blood pressure 103/60, pulse 70, temperature 98.5 F (36.9 C), temperature source Oral, resp. rate 20, height  5\' 2"  (1.575 m), weight 96.2 kg, SpO2 96%.  Filed Weights   07/24/23 1202  Weight: 96.2 kg    Labs & Radiologic Studies    CBC Recent Labs    07/25/23 0450 07/26/23 0528  WBC 9.1 8.5  HGB 13.8 12.5  HCT 42.4 38.9  MCV 92.2 93.1  PLT 180 193   Basic Metabolic Panel Recent Labs    16/10/96 1040 07/25/23 0450  NA 138 137  K 4.3 4.6  CL 104 107  CO2 20* 22  GLUCOSE 106* 87  BUN 20 19  CREATININE 1.55* 1.35*  CALCIUM 9.7 9.2   Liver Function Tests No results for input(s): "AST", "ALT", "ALKPHOS", "BILITOT", "PROT", "ALBUMIN" in the last 72 hours. No results for input(s): "LIPASE", "AMYLASE" in the last 72 hours. High Sensitivity Troponin:   Recent Labs  Lab 07/24/23 1040 07/24/23 1240 07/24/23 1449 07/24/23 1637  TROPONINIHS 191* 518* 948* 973*    BNP Invalid input(s):  "POCBNP" D-Dimer No results for input(s): "DDIMER" in the last 72 hours. Hemoglobin A1C No results for input(s): "HGBA1C" in the last 72 hours. Fasting Lipid Panel Recent Labs    07/25/23 0450  CHOL 191  HDL 61  LDLCALC 118*  TRIG 61  CHOLHDL 3.1   Thyroid Function Tests No results for input(s): "TSH", "T4TOTAL", "T3FREE", "THYROIDAB" in the last 72 hours.  Invalid input(s): "FREET3" _____________  CT Angio Chest/Abd/Pel for Dissection W and/or Wo Contrast  Result Date: 07/24/2023 CLINICAL DATA:  Chest pain.  Acute aortic syndrome suspected. EXAM: CT ANGIOGRAPHY CHEST, ABDOMEN AND PELVIS TECHNIQUE: Non-contrast CT of the chest was initially obtained. Multidetector CT imaging through the chest, abdomen and pelvis was performed using the standard protocol during bolus administration of intravenous contrast. Multiplanar reconstructed images and MIPs were obtained and reviewed to evaluate the vascular anatomy. RADIATION DOSE REDUCTION: This exam was performed according to the departmental dose-optimization program which includes automated exposure control, adjustment of the mA and/or kV according to patient size and/or use of iterative reconstruction technique. CONTRAST:  60mL ISOVUE-370 IOPAMIDOL (ISOVUE-370) INJECTION 76% COMPARISON:  Chest CT 01/02/2023 FINDINGS: CTA CHEST FINDINGS Cardiovascular: Preferential opacification of the thoracic aorta. No evidence of thoracic aortic aneurysm or dissection. Normal heart size. No pericardial effusion. Main pulmonary arteries are patent. Great vessels are patent with typical three-vessel arch anatomy. Proximal bilateral vertebral arteries are patent. Left chest pacemaker with leads in the right atrium and right ventricle. Mediastinum/Nodes: Numerous calcifications in the mediastinum and bilateral hila. No lymph node enlargement in the chest. Esophagus is unremarkable. No axillary lymph node enlargement. Lungs/Pleura: Multiple bilateral calcified  granulomas. Mild interstitial septal thickening throughout the lungs. Mild thickening along the medial aspect of the right major fissure. No pleural effusions. Small patchy peripheral densities in the left upper lobe on image 54/7 are new. Mild thickening in the medial aspect of the left major fissure. Musculoskeletal: No acute bone abnormality. Review of the MIP images confirms the above findings. CTA ABDOMEN AND PELVIS FINDINGS VASCULAR Aorta: Normal caliber aorta without aneurysm, dissection, vasculitis or significant stenosis. Celiac: Patent without evidence of aneurysm, dissection, vasculitis or significant stenosis. Incidentally, there is an accessory left hepatic artery originating from the left gastric artery. SMA: Patent without evidence of aneurysm, dissection, vasculitis or significant stenosis. Renals: 2 right renal arteries. Question 2 left renal arteries versus a left renal branch that originates near the origin. No evidence for aneurysm, dissection or significant stenosis involving the renal arteries. IMA: Patent without evidence of aneurysm, dissection,  vasculitis or significant stenosis. Inflow: Patent without evidence of aneurysm, dissection, vasculitis or significant stenosis. Veins: No obvious venous abnormality within the limitations of this arterial phase study. Incidentally, there is a retroaortic left renal vein which is a normal variant. Review of the MIP images confirms the above findings. NON-VASCULAR Hepatobiliary: Cholecystectomy. Stable dilatation of the extrahepatic bile duct measuring up to 1.4 cm. Normal appearance of the liver. Pancreas: Unremarkable. No pancreatic ductal dilatation or surrounding inflammatory changes. Spleen: Multiple small calcifications.  Spleen is normal for size. Adrenals/Urinary Tract: Normal adrenal glands. Normal appearance of the urinary bladder. Normal appearance of both kidneys without hydronephrosis. No suspicious renal lesion. Stomach/Bowel: Normal  appearance of the stomach. Scattered colonic diverticula. No evidence for bowel obstruction. No acute bowel inflammation. Lymphatic: Again noted is a calcified mesenteric mass in the central abdomen that measures 4.6 x 2.8 cm on image 166/6 and similar to the prior examination. Stable mesenteric stranding in the left upper abdomen. No enlarged retroperitoneal lymph nodes. Reproductive: Status post hysterectomy. No adnexal masses. Other: Negative for free fluid. Negative for free air. Again noted is laxity along the anterior abdominal wall and there may be a small ventral hernia containing fat. Musculoskeletal: Right hip arthroplasty is located. Suspect old fracture involving the right sacral ala. Multilevel degenerative disc disease in the lumbar spine. No acute bone abnormality. Review of the MIP images confirms the above findings. IMPRESSION: 1. No acute aortic abnormality. Negative for aortic aneurysm or dissection. 2. Subtle patchy peripheral densities in left upper lung with bilateral septal thickening in both lungs. Findings are nonspecific but could represent asymmetric pulmonary edema or mild interstitial edema with an infectious or inflammatory process in the left upper lobe. 3. Stable appearance of the partially calcified mesenteric mass. 4. No acute abnormality in the abdomen or pelvis. Electronically Signed   By: Richarda Overlie M.D.   On: 07/24/2023 15:30   DG Chest Port 1 View  Result Date: 07/24/2023 CLINICAL DATA:  Chest pain. EXAM: PORTABLE CHEST 1 VIEW COMPARISON:  01/02/2023 FINDINGS: Heart size is stable with a left chest dual chamber cardiac pacemaker and median sternotomy wires. Scattered calcified granulomas in both lungs. Slightly increased interstitial markings concerning for mild interstitial edema. No focal airspace disease or lung consolidation. Negative for a pneumothorax. IMPRESSION: Slightly increased interstitial markings concerning for mild interstitial edema. Electronically Signed    By: Richarda Overlie M.D.   On: 07/24/2023 15:00   CUP PACEART INCLINIC DEVICE CHECK  Result Date: 07/24/2023 Pt brought in for continued symptoms from previous three encounters. Physical discomfort notable and unable to determine if it is related to PPM w/ certainty. Reviewed w/ SK. We were unable to definitively replicate it. It appeared to happen, occasionally, with the VP timing out d/t MVP and AR followed by APVP; however, there were several times this happened and the patient did not notice. Insufficient data to rule in or out PPM being cause of discomfort. Changes made this session made per SK. MVP disabled. Programmed DDDR 70. Rate increased from 60 to 70. PAV delay decreased to from SAV delay decreased to from Ziopatch ordered w/ symptom activator.Raj Janus, RN  CUP PACEART INCLINIC DEVICE CHECK  Result Date: 07/02/2023 Patient brought in acutely today to follow up from 10/11 device clinic visit and symptoms.  After adjustments made 10/11, patient continues to complain of intermittent dizziness, HA, SOB, chest tightness, unsteady on feet with exertional activities.  VP has decreased to 4%.  She is  able to swim 4 laps which is improvement. Interrogation of device:  normal device function, sensing, threshold and lead impedance trends all stable per baseline.  Histograms are flat with little to no heart rate variability since starting Amiodarone.  Reviewed with Dr. Graciela Short.  Suspect amiodarone  has suppressed sinus node and prolonged conduction and decreased heart rate variability and created an increase in AP.  Overall amiodarone suppression may also be contributing to symptoms.  Orders today to turn on Rate response. Patient is very active. NOTE: patient experienced significant symptoms with ambulation after initial turning on of rate response at nominal settings.  After ambulation re-trial adjustments to rate response, patient tolerated well.  She was given device clinic number  to call if any concerns or need for adjustment. PROGRAMMING CHANGES MADE TODAY: 1. RV programmed threshold:  2.5@ .4  TURNED ADAPTIVE OFF - threshold test today 1.25@0 .4ms. 2.  PAV 320  SAV 350 3. Turned RATE RESPONSE ON * initial changes USR: 125, ADL rate: 95, ADL Response: 3, ADL setpoint 8,  Exertion response 3, Activity Threshold: Low  THESE CHANGES were too aggressive. * 2ndary changes - Successful Ambulation Test Rate Response adjustment:  USR - 120,  ADL RATE: 90,  ADL RESPONSE: 2,  ADL SETPOINT: 12, EXERTION RESPONSE:  2,  ACTIVITY THREHOLD: Med High.Syliva Overman, RN  Disposition   Pt is being discharged home today in good condition.  Follow-up Plans & Appointments     Follow-up Information     Samantha Palmer, MD. Call.   Specialty: Family Medicine Why: Follow up 7-10 days Contact information: [address] [place] [address] [phone]         Duke Salvia, MD. Call.   Specialty: Cardiology Why: Schedule appt for follow up 7-10 days Contact information: [address] [place] [address] [phone]                Discharge Instructions     Amb Referral to Cardiac Rehabilitation   Complete by: As directed    Diagnosis: NSTEMI   After initial evaluation and assessments completed: Virtual Based Care may be provided alone or in conjunction with Phase 2 Cardiac Rehab based on patient barriers.: Yes   Intensive Cardiac Rehabilitation (ICR) [facility] location only OR Traditional Cardiac Rehabilitation (TCR) *If criteria for ICR are not met will enroll in TCR [facility] only): Yes   Call MD for:  redness, tenderness, or signs of infection (pain, swelling, redness, odor or green/yellow discharge around incision site)   Complete by: As directed    Diet - low sodium heart healthy   Complete by: As directed    Discharge instructions   Complete by: As directed    Radial Site Care Refer to this sheet in the next few weeks. These  instructions provide you with information on caring for yourself after your procedure. Your caregiver may also give you more specific instructions. Your treatment has been planned according to current medical practices, but problems sometimes occur. Call your caregiver if you have any problems or questions after your procedure. HOME CARE INSTRUCTIONS You may shower the day after the procedure. Remove the bandage (dressing) and gently wash the site with plain soap and water. Gently pat the site dry.  Do not apply powder or lotion to the site.  Do not submerge the affected site in water for 3 to 5 days.  Inspect the site at least twice daily.  Do not flex or bend the  affected arm for 24 hours.  No lifting over 5 pounds (2.3 kg) for 5 days after your procedure.  Do not drive home if you are discharged the same day of the procedure. Have someone else drive you.  You may drive 24 hours after the procedure unless otherwise instructed by your caregiver.  What to expect: Any bruising will usually fade within 1 to 2 weeks.  Blood that collects in the tissue (hematoma) may be painful to the touch. It should usually decrease in size and tenderness within 1 to 2 weeks.  SEEK IMMEDIATE MEDICAL CARE IF: You have unusual pain at the radial site.  You have redness, warmth, swelling, or pain at the radial site.  You have drainage (other than a small amount of blood on the dressing).  You have chills.  You have a fever or persistent symptoms for more than 72 hours.  You have a fever and your symptoms suddenly get worse.  Your arm becomes pale, cool, tingly, or numb.  You have heavy bleeding from the site. Hold pressure on the site.   No heavy lifting or strenuous activity for 4 weeks.   Increase activity slowly   Complete by: As directed         Discharge Medications   Allergies as of 07/26/2023       Reactions   Ceclor [cefaclor] Other (See Comments)   Lost vision in eye   Hctz  [hydrochlorothiazide] Other (See Comments)   Renal insufficiency   Lipitor [atorvastatin Calcium] Other (See Comments)   Increased A1C   Norvasc [amlodipine] Other (See Comments)   Makes patient stiff   Penicillins Hives, Shortness Of Breath, Itching, Rash   Sulfa Antibiotics Other (See Comments)   CAUSES SHOCK   Milk-related Compounds Diarrhea, Nausea And Vomiting   Statins Other (See Comments)   MYALGIAS AND WEAKNESS   Erythromycin Rash   Latex Rash   Levofloxacin Rash   Tetracyclines & Related Rash        Medication List     TAKE these medications    acetaminophen 650 MG CR tablet Commonly known as: TYLENOL Take 650 mg by mouth every 8 (eight) hours as needed for pain.   apixaban 5 MG Tabs tablet Commonly known as: ELIQUIS Take 1 tablet (5 mg total) by mouth 2 (two) times daily.   BLINK TEARS OP Place 1 drop into both eyes daily as needed (dry eyes).   cholecalciferol 25 MCG (1000 UNIT) tablet Commonly known as: VITAMIN D3 Take 1,000 Units by mouth daily.   cyanocobalamin 1000 MCG/ML injection Commonly known as: VITAMIN B12 Inject 1,000 mcg into the muscle every 30 (thirty) days.   empagliflozin 10 MG Tabs tablet Commonly known as: Jardiance Take 1 tablet (10 mg total) by mouth daily before breakfast.   ezetimibe 10 MG tablet Commonly known as: Zetia Take 1 tablet (10 mg total) by mouth daily.   famotidine 20 MG tablet Commonly known as: PEPCID Take 20 mg by mouth daily as needed for heartburn or indigestion.   furosemide 20 MG tablet Commonly known as: Lasix Take 20 mg daily alternating with 40 mg every other day What changed:  how much to take how to take this when to take this   levothyroxine 88 MCG tablet Commonly known as: SYNTHROID Take 88 mcg by mouth daily before breakfast.   loratadine 10 MG tablet Commonly known as: CLARITIN Take 10 mg by mouth daily.   metoprolol tartrate 50 MG tablet Commonly known as: LOPRESSOR  Take 1 tablet  (50 mg total) by mouth 2 (two) times daily.   multivitamin with minerals Tabs tablet Take 1 tablet by mouth daily.   nitroGLYCERIN 0.4 MG SL tablet Commonly known as: NITROSTAT PLACE 1 TABLET UNDER THE TONGUE EVERY 5 MINUTES AS NEEDED FOR CHEST PAIN   pantoprazole 40 MG tablet Commonly known as: PROTONIX Take 40 mg by mouth daily.   PreserVision AREDS 2 Caps Take 1 capsule by mouth 2 (two) times daily.   valsartan 80 MG tablet Commonly known as: DIOVAN Take 1 tablet (80 mg total) by mouth daily.         Outstanding Labs/Studies   BMET at follow up appt   Duration of Discharge Encounter   Greater than 30 minutes including physician time.  Signed, Laverda Page, NP 07/26/2023, 12:07 PM  ATTENDING ATTESTATION  I have seen, examined and evaluated the patient this morning on rounds along with Laverda Page, NP.  After reviewing all the available data and chart, we discussed the patients laboratory, study & physical findings as well as symptoms in detail.  I agree with her findings, examination, summary as well as impression recommendations as per our discussion.    Attending adjustments noted in italics.   Patient presented with symptoms that sound potentially consistent with ACS although somewhat atypical in nature of symptoms.  She is also had some pulsating sensations block potentially related to her pacemaker.  She did rule in with elevated troponin levels for possible ACS MI, however cardiac apposition did not show any acute culprit lesion, an echocardiogram clearly shows evidence of Takotsubo cardiomyopathy.  She is on relatively stable baseline home medication regimen and we chose to continue her home medications allow her to recover.  She felt great on the day of discharge.  Will need close follow-up and consider reassessment of EF in roughly 3 to 4 months to ensure recovery of function. Although her EF was down having a acute systolic failure, she had no symptoms  of heart failure-no PND orthopnea and no exertional dyspnea.  Suspect that she should have reasonable recovery.  I thought that she was stable for discharge despite the results on her echocardiogram.    Marykay Lex, MD, MS Bryan Lemma, M.D., M.S. Interventional Cardiologist  [facility]  Pager # [phone] Phone # [phone] [address] [place], [place] [address]

## 2023-07-26 NOTE — Progress Notes (Signed)
CARDIAC REHAB PHASE I   Pt has just discharged to home. Referral for CRP2 sent to [facility]. Will call later today to review post MI education. Educational materials will be mailed to address on file.  [id number] Woodroe Chen, RN BSN 07/26/2023 10:54 AM

## 2023-07-26 NOTE — Care Management (Addendum)
  Transition of Care [facility]) Screening Note   Patient Details  Name: Samantha Short Date of Birth: 1946/05/17   Transition of Care [facility]) CM/SW Contact:    Lockie Pares, RN Phone Number: 07/26/2023, 8:37 AM    Transition of Care Department [facility]) has reviewed patient and no TOC needs have been identified at this time. We will continue to monitor patient advancement through interdisciplinary progression rounds. If new patient transition needs arise, please place a TOC consult.  Patient will DC today, FU with PCP and cardiology

## 2023-07-26 NOTE — Progress Notes (Signed)
  Echocardiogram 2D Echocardiogram has been performed.  Samantha Short 07/26/2023, 9:04 AM

## 2023-07-26 NOTE — Telephone Encounter (Signed)
CARDIAC REHAB PHASE I   Unable to reach via phone. Will mail education materials to home address on file.   Woodroe Chen, RN BSN 07/26/2023 12:25 PM

## 2023-07-27 ENCOUNTER — Encounter: Payer: Self-pay | Admitting: Internal Medicine

## 2023-07-27 ENCOUNTER — Telehealth (HOSPITAL_COMMUNITY): Payer: Self-pay

## 2023-07-27 DIAGNOSIS — E538 Deficiency of other specified B group vitamins: Secondary | ICD-10-CM | POA: Diagnosis not present

## 2023-07-27 NOTE — Telephone Encounter (Signed)
Pt insurance is active and benefits verified through [facility] Co-pay $45, DED 0/0 met, out of pocket $4,500/$2,397.51 met, co-insurance 0%. no pre-authorization required. Passport, 07/27/2023@4 :26, REF# [id number]   How many CR sessions are covered? (for ICR)no limit Is this a lifetime maximum or an annual maximum? annual Has the member used any of these services to date? no Is there a time limit (weeks/months) on start of program and/or program completion? no     Will contact patient to see if she is interested in the Cardiac Rehab Program. If interested, patient will need to complete follow up appt. Once completed, patient will be contacted for scheduling upon review by the RN Navigator.

## 2023-07-28 DIAGNOSIS — R001 Bradycardia, unspecified: Secondary | ICD-10-CM | POA: Diagnosis not present

## 2023-07-28 DIAGNOSIS — I4819 Other persistent atrial fibrillation: Secondary | ICD-10-CM

## 2023-07-30 DIAGNOSIS — N183 Chronic kidney disease, stage 3 unspecified: Secondary | ICD-10-CM | POA: Diagnosis not present

## 2023-07-30 DIAGNOSIS — K219 Gastro-esophageal reflux disease without esophagitis: Secondary | ICD-10-CM | POA: Diagnosis not present

## 2023-07-30 DIAGNOSIS — N2589 Other disorders resulting from impaired renal tubular function: Secondary | ICD-10-CM | POA: Diagnosis not present

## 2023-07-30 DIAGNOSIS — I129 Hypertensive chronic kidney disease with stage 1 through stage 4 chronic kidney disease, or unspecified chronic kidney disease: Secondary | ICD-10-CM | POA: Diagnosis not present

## 2023-07-30 DIAGNOSIS — N2581 Secondary hyperparathyroidism of renal origin: Secondary | ICD-10-CM | POA: Diagnosis not present

## 2023-07-31 ENCOUNTER — Telehealth: Payer: Self-pay

## 2023-07-31 DIAGNOSIS — E785 Hyperlipidemia, unspecified: Secondary | ICD-10-CM

## 2023-07-31 DIAGNOSIS — I2510 Atherosclerotic heart disease of native coronary artery without angina pectoris: Secondary | ICD-10-CM

## 2023-07-31 NOTE — Telephone Encounter (Signed)
-----   Message from Nurse Zelphia Cairo sent at 07/30/2023  5:25 PM EST -----  ----- Message ----- From: Arty Baumgartner, NP Sent: 07/26/2023   3:49 PM EST To: [address] Triage  Please inform Ms. Helbling her LP (a) was elevated at 128. Would recommend a referral to the lipid clinic as she has been intolerant to statin and only on Zetia. Thanks!

## 2023-07-31 NOTE — Telephone Encounter (Signed)
Spoke with pt and advised of lab results per NP as below.  Pt verbalizes understanding and is agreeable to referral.  Pt advised she will be contacted to schedule an appointment.

## 2023-08-01 ENCOUNTER — Ambulatory Visit: Payer: Medicare HMO | Attending: Cardiology | Admitting: Pharmacist

## 2023-08-01 DIAGNOSIS — E785 Hyperlipidemia, unspecified: Secondary | ICD-10-CM | POA: Diagnosis not present

## 2023-08-01 NOTE — Patient Instructions (Addendum)
It was nice to meet you today!  We will submit to your insurance for Repatha: if approved inject 140 mg subcutaneous into the skin every 14 days (2 weeks).   Continue ezetimibe 5 mg (1/2 tablet) once daily  2-3 months after you start the new medication, come back to have your cholesterol checked.   We will call you when we have an update!

## 2023-08-01 NOTE — Progress Notes (Unsigned)
Patient ID: Samantha Short                 DOB: 12-12-1945                    MRN: [id number]      HPI: Samantha Short is a 77 y.o. female patient referred to lipid clinic by  Laverda Page, NP. PMH is significant for takotsubo cardiomyopathy (EF 25-30% 07/26/23), CAD (1v CABG 2010, NSTEMI Nov 2024), HLD, HTN, pacemaker, chronic AF with RVR on Eliquis, CKD IIIb, OSA.  Frequent admissions - most recently admitted from 07/24/23 to 07/26/23 with CP in the setting of extreme stress, tp 191 >> 518. LHC demonstrated non-obstructive CAD, unchanged from previous exam (prox to mid LAD 30% stenoses, Ost LM lesion 20% stenosed). Echo demonstrated biventricular systolic dysfunction c/w Takotsubo cardiomyopathy, EF 25-30%, apical RV hypokinesis, RV fxn moderately reduced, trivial AS. GDMT including metoprolol tartrate 25 mg PO BID, valsartan 80 mg PO daily, and Jardiance 10 mg PO daily were continued - she has a hx of hyperkalemia on spironoalctone. She has a history of statin intolerance (myalgias and muscle aches) on atorvastatin 40, pravastatin 10 and 20.   Patient presents to lipid clinic today. She is currently on ezetimibe 5 mg (1/2 tab) PO daily, as she was instructed to cut her 10 mg tab in half after she expressed concern about developing muscle stiffness to ezetimibe - which she states she had experienced int he past. However, dispense report shows that ezetimibe 10 was filled for 90ds on 05/03/23 and subsequent LDL-C in Oct was 78 mg/dL c/w treatment. Pt expresses she is having some brain fog and is unable to remember specific details of which medicines she may have been taking in the past few months. Most recent LDL-C during admission elevated to 118 mg/dL, and Lp(a) elevated to 128 mg/dL. TG wnl. Since discharge, she endorses some mild chest pain, ShOB with exertion, difficulty laying flat to sleep (which improved last night when she changed her biPAP mask). Denies swelling.  Reviewed options for  lowering LDL cholesterol, including ezetimibe, PCSK-9 inhibitors, and bempedoic acid. Inclisiran would not be well covered by her insurance. Discussed mechanisms of action, dosing, side effects and potential decreases in LDL cholesterol.  Also reviewed cost information and potential options for patient assistance. She gets Jardiance and Eliquis through patient assistance.   Current Medications: ezetimibe 5 mg PO daily Intolerances: atorvastatin 40, pravastatin 10 mg and 20 mg (muscle aches, myalgias, "couldn't move") Risk Factors: progressive ASCVD, HF, HTN (controlled), CKD LDL-C goal: <55 mg/dL ApoB goal: <40  mg/dL  Exercise: Patient has been trying to walk around, move around since hospital discharge. Endorses some labored breathing.  Family History: MI in mother, heart disease in father, heart disease in sister and brother, CAD in son  Social History: Has grandson with special needs - will be traveling to [place] the day after Thanksgiving for a family vacation. Former smoker, quit 2011. Denies alcohol use.  Labs: Lipid Panel     Component Value Date/Time   CHOL 191 07/25/2023 0450   CHOL 168 06/18/2023 1127   TRIG 61 07/25/2023 0450   HDL 61 07/25/2023 0450   HDL 78 06/18/2023 1127   CHOLHDL 3.1 07/25/2023 0450   VLDL 12 07/25/2023 0450   LDLCALC 118 (H) 07/25/2023 0450   LDLCALC 78 06/18/2023 1127   LABVLDL 12 06/18/2023 1127    Past Medical History:  Diagnosis Date   Anemia  pernicious   Anxiety    Arthritis    Asthma    Chronic diastolic CHF (congestive heart failure) (HCC)    a. 02/2011 Echo: EF 55-60%, Gr2 DD, Mild MR   Chronic kidney disease    stage 3   Complication of anesthesia    Coronary artery disease    a. s/p CABG x 1 2010:  LIMA->LAD.;  b. amdx for CP => LHC 07/04/12: LAD 70-80%, mid RCA 30%, LIMA-LAD patent with competitive flow limiting distal LAD filling, EF 70% with hyperdynamic LV function. Medical therapy continued.   Depression    Dyslipidemia     GERD (gastroesophageal reflux disease)    Headache    hx of migraines   Heart murmur    History of hiatal hernia    History of kidney stones    Hyperlipidemia    Hypertension    Hyperthyroidism    Hypothyroidism    Mild aortic stenosis    Mitral regurgitation    a. mild by echo 02/2011.   PAF (paroxysmal atrial fibrillation) (HCC)    Pneumonia    PONV (postoperative nausea and vomiting)    Pre-diabetes    Presence of permanent cardiac pacemaker    Sleep apnea    diagnosed 08-2022    Current Outpatient Medications on File Prior to Visit  Medication Sig Dispense Refill   acetaminophen (TYLENOL) 650 MG CR tablet Take 650 mg by mouth every 8 (eight) hours as needed for pain.     apixaban (ELIQUIS) 5 MG TABS tablet Take 1 tablet (5 mg total) by mouth 2 (two) times daily. 28 tablet    cholecalciferol (VITAMIN D3) 25 MCG (1000 UNIT) tablet Take 1,000 Units by mouth daily.     cyanocobalamin (,VITAMIN B-12,) 1000 MCG/ML injection Inject 1,000 mcg into the muscle every 30 (thirty) days.     empagliflozin (JARDIANCE) 10 MG TABS tablet Take 1 tablet (10 mg total) by mouth daily before breakfast. 14 tablet    ezetimibe (ZETIA) 10 MG tablet Take 1 tablet (10 mg total) by mouth daily. 90 tablet 1   famotidine (PEPCID) 20 MG tablet Take 20 mg by mouth daily as needed for heartburn or indigestion.     furosemide (LASIX) 20 MG tablet Take 20 mg daily alternating with 40 mg every other day (Patient taking differently: Take 20-40 mg by mouth daily. Take 20 mg daily alternating with 40 mg every other day) 90 tablet 1   levothyroxine (SYNTHROID) 88 MCG tablet Take 88 mcg by mouth daily before breakfast.     loratadine (CLARITIN) 10 MG tablet Take 10 mg by mouth daily.     metoprolol tartrate (LOPRESSOR) 50 MG tablet Take 1 tablet (50 mg total) by mouth 2 (two) times daily.     Multiple Vitamin (MULTIVITAMIN WITH MINERALS) TABS tablet Take 1 tablet by mouth daily.     Multiple Vitamins-Minerals  (PRESERVISION AREDS 2) CAPS Take 1 capsule by mouth 2 (two) times daily.     nitroGLYCERIN (NITROSTAT) 0.4 MG SL tablet PLACE 1 TABLET UNDER THE TONGUE EVERY 5 MINUTES AS NEEDED FOR CHEST PAIN 25 tablet 0   pantoprazole (PROTONIX) 40 MG tablet Take 40 mg by mouth daily.     Polyethylene Glycol 400 (BLINK TEARS OP) Place 1 drop into both eyes daily as needed (dry eyes).     valsartan (DIOVAN) 80 MG tablet Take 1 tablet (80 mg total) by mouth daily. 90 tablet 3   No current facility-administered medications on file prior to  visit.    Allergies  Allergen Reactions   Ceclor [Cefaclor] Other (See Comments)    Lost vision in eye   Hctz [Hydrochlorothiazide] Other (See Comments)    Renal insufficiency   Lipitor [Atorvastatin Calcium] Other (See Comments)    Increased A1C   Norvasc [Amlodipine] Other (See Comments)    Makes patient stiff   Penicillins Hives, Shortness Of Breath, Itching and Rash   Sulfa Antibiotics Other (See Comments)    CAUSES SHOCK   Milk-Related Compounds Diarrhea and Nausea And Vomiting   Statins Other (See Comments)    MYALGIAS AND WEAKNESS   Erythromycin Rash   Latex Other (See Comments)    Patient reports she had vomiting after shoulder surgery- was told it was possibly from latex. Denies having a latex rash.   Levofloxacin Rash   Tetracyclines & Related Rash    Assessment/Plan:  1. Hyperlipidemia -  Hyperlipidemia with target LDL less than 70 Assessment - LDL-C of 118 mg/dL is above goal <82 mg/dL given progressive CAD. Pt reports she was not taking any cholesterol medication at time of hospitalization.  - Patient is tolerating half dose of ezetimibe (5 mg total) well, without muscle AE. Last LFTs wnl.  - She has tried multiple statins with intolerable myalgias. Would be an excellent mAb PCSK9i candidate to improve CV risk given multiple ACS events (recent NSTEMI), CKD, HTN, OSA, and new-onset HFrEF.   - Confirmed no hx of serious skin reaction or anaphylaxis  to latex that would increase risk of intolerability to Repatha autoinjector.   Plan - Will submit PA for Repatha. If approved, start Repatha 140 mg subcutaneous every 14 days.  - Continue ezetimibe 5 mg PO daily - Return for repeat lipid panel in 2-3 months  Thank you,  Nils Pyle, PharmD PGY1 Pharmacy Resident  Olene Floss, Pharm.D, BCACP, BCPS, CPP  [facility] [address] [place], [place] [address]  Phone: [phone]; Fax: [phone]

## 2023-08-01 NOTE — Assessment & Plan Note (Addendum)
Assessment - LDL-C of 118 mg/dL is above goal <16 mg/dL given progressive CAD. Pt reports she was not taking any cholesterol medication at time of hospitalization.  - Patient is tolerating half dose of ezetimibe (5 mg total) well, without muscle AE. Last LFTs wnl.  - She has tried multiple statins with intolerable myalgias. Would be an excellent mAb PCSK9i candidate to improve CV risk given multiple ACS events (recent NSTEMI), CKD, HTN, OSA, and new-onset HFrEF.   - Confirmed no hx of serious skin reaction or anaphylaxis to latex that would increase risk of intolerability to Repatha autoinjector.   Plan - Will submit PA for Repatha. If approved, start Repatha 140 mg subcutaneous every 14 days.  - Continue ezetimibe 5 mg PO daily - Return for repeat lipid panel in 2-3 months

## 2023-08-02 ENCOUNTER — Other Ambulatory Visit (HOSPITAL_COMMUNITY): Payer: Self-pay

## 2023-08-02 ENCOUNTER — Telehealth: Payer: Self-pay | Admitting: Pharmacy Technician

## 2023-08-02 DIAGNOSIS — E785 Hyperlipidemia, unspecified: Secondary | ICD-10-CM

## 2023-08-02 NOTE — Progress Notes (Signed)
Cardiology Office Note:    Date:  08/03/2023  ID:  TERRIYAH LINNEBUR, DOB June 17, 1946, MRN [id number] PCP: Mila Palmer, MD  [facility] Providers Cardiologist:  Verne Carrow, MD Electrophysiologist:  Sherryl Manges, MD       Patient Profile:      Samantha Short is a 77 year old female with visit pertinent history of CAD s/p 1V CABG in 2010, hypertension, hyperlipidemia, GERD, mitral regurgitation, aortic stenosis, chronic HFpEF, high grade AV block s/p MDT permanent pacemaker placement, PAF, OSA  She has been followed by Dr. Clifton James as well as Dr. Graciela Husbands as an outpatient.  She has a history of statin intolerance as well as being unable to tolerate HCTZ or Norvasc.  Her last LHC was 2021 with patent LIMA to LAD, native LAD with normal antegrade flow and thus there was competitive flow for the LIMA, medical therapy was recommended.  She did have prominent V waves on PCWP suggestive of significant MR but echo showed only mild MR.  Echo 12/2022 with LVEF of 60-65%, normal RV, mild MR, moderate aortic stenosis.  She was admitted 12/2022 with chest pain, palpitations and hypertensive urgency as well as A-fib RVR.  She was treated with IV diltiazem and converted to sinus rhythm, amiodarone and metoprolol were added.  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  She was started on Jardiance 04/2023 in the setting of lower extremity edema.  She presented to the ED on 07/24/2023 with complaints of chest pain.  She reported over the past couple months she has been having a rhythmic type of chest pressure sensation that was initially thought to be related to her PPM, but after seeing Dr. Graciela Husbands this was not felt to be the case.  She notes that morning she was woken up from sleep around 4 AM with centralized sternal chest pain and shortness of breath.  She noted that the  chest pain was severe which prompted her to come to the ED.  Labs in the ED showed high sensitive troponin of 191, 518.  EKG showed sinus rhythm with AV pacing.  Her chest x-ray showed increased interstitial markings concerning for mild edema.  She had an LHC performed on 07/25/2023 showing proximal-mid LAD 30% stenosis, ostial OM1 20% stenosis, small caliber LIMA but patent.  There was no obstructive lesion seen heart cardiac catheterization with no major change from a couple years ago.  This suggested that troponin elevation may be more related to her notable social stress.  Her echocardiogram showed biventricular systolic dysfunction consistent with Takotsubo cardiomyopathy, LVEF 25-30%, entire apex is akinetic, mid anteroseptal, mid inferolateral, mid anterolateral, mid inferoseptal, mid anterior, and mid inferior segment are hypokinetic, apical RV hypokinesis with RV function moderately reduced, trivial AS.  She was to continue metoprolol 25 mg twice daily, valsartan 80 mg daily, Jardiance daily.      History of Present Illness:   Samantha Short is a 77 y.o. female who returns for hospital follow-up for Takotsubo cardiomyopathy and heart failure.  Today, patient notes that she is doing okay.  She notes that she continues to be stressed and deal with anxiety.  She notes that she has multiple sick family members with 1 passing away not too long ago.  She notes that her daughter has adopted a kid who is mentally challenged that is aggressive at times.  She notes that she is trying to deal  with her stress the best way she can but she has continued to be stressed since she got home from the hospital.  Since arriving home from the hospital she has developed mild to moderate shortness of breath, PND, and orthopnea.  She tells me when she lays down flat in bed that she feels smothered and feels like she is unable to breathe.  She has to sleep sitting up or with her BiPAP mask on to get somewhat comfortable.   She notes that the symptoms are new for her prior to her admission.  She does note mild chest tightness every time she lays flat.  She does not experience any DOE during the day or exertional angina.  She denies any syncope,         Review of Systems  Constitutional: Negative for weight gain and weight loss.  Cardiovascular:  Positive for chest pain and orthopnea. Negative for claudication, cyanosis, dyspnea on exertion, irregular heartbeat, leg swelling, near-syncope, palpitations, paroxysmal nocturnal dyspnea and syncope.  Respiratory:  Positive for shortness of breath. Negative for cough and hemoptysis.   Gastrointestinal:  Negative for abdominal pain, hematochezia and melena.  Genitourinary:  Negative for hematuria.  Neurological:  Negative for dizziness, headaches and light-headedness.     See HPI    Studies Reviewed:   EKG Interpretation Date/Time:  Friday August 03 2023 14:16:29 EST Ventricular Rate:  75 PR Interval:  176 QRS Duration:  148 QT Interval:  452 QTC Calculation: 504 R Axis:   -80  Text Interpretation: AV dual-paced rhythm When compared with ECG of 24-Jul-2023 10:42, PREVIOUS ECG IS PRESENT Confirmed by Rise Paganini [phone]) on 08/03/2023 2:32:30 PM    Echocardiogram 07/26/2023 1. Biventricular systolic dysfunction with relative sparing of the base.  Findings concerning for Takotsubo cardiomyopathy.   2. Left ventricular ejection fraction, by estimation, is 25 to 30%. The  left ventricle has severely decreased function. The left ventricle  demonstrates regional wall motion abnormalities (see scoring  diagram/findings for description). There is mild  concentric left ventricular hypertrophy. Left ventricular diastolic  parameters are consistent with Grade II diastolic dysfunction  (pseudonormalization).   3. Mid to apical RV hypokinesis. Right ventricular systolic function is  moderately reduced. The right ventricular size is normal. There is mildly   elevated pulmonary artery systolic pressure.   4. The mitral valve is normal in structure. Mild mitral valve  regurgitation. No evidence of mitral stenosis.   5. Tricuspid valve regurgitation is mild to moderate.   6. The aortic valve is tricuspid. There is moderate calcification of the  aortic valve. There is moderate thickening of the aortic valve. Aortic  valve regurgitation is trivial. No aortic stenosis is present. Aortic  valve area, by VTI measures 1.24 cm.  Aortic valve mean gradient measures 21.0 mmHg. Aortic valve Vmax measures  2.83 m/s.   7. The inferior vena cava is normal in size with greater than 50%  respiratory variability, suggesting right atrial pressure of 3 mmHg.   LHC 07/25/2023   Prox LAD to Mid LAD lesion is 30% stenosed.   Ost LM lesion is 20% stenosed.   LIMA and is small.   The graft exhibits no disease.   There is competitive flow.   1.  Mild nonobstructive coronary artery disease.  Patent LIMA to LAD but the graft mostly has antegrade and retrograde flow given that native LAD does not seem to have obstructive disease. 2.  Left ventricular angiography was not diagnostic.  LVEDP was mildly elevated.  Diagnostic Dominance: Right Risk Assessment/Calculations:    CHA2DS2-VASc Score = 6 [CHF History: 1, HTN History: 1, Diabetes History: 0, Stroke History: 0, Vascular Disease History: 1, Age Score: 2, Gender Score: 1].  Therefore, the patient's annual risk of stroke is 9.7 %.             Physical Exam:   VS:  BP 124/80   Pulse 75   Ht 5\' 2"  (1.575 m)   Wt 212 lb 6.4 oz (96.3 kg)   SpO2 90%   BMI 38.85 kg/m    Wt Readings from Last 3 Encounters:  08/03/23 212 lb 6.4 oz (96.3 kg)  07/24/23 212 lb (96.2 kg)  07/23/23 212 lb (96.2 kg)    Constitutional:      Appearance: Normal and healthy appearance.  HENT:     Head: Normocephalic.  Neck:     Vascular: JVD normal.  Pulmonary:     Effort: Pulmonary effort is normal.     Breath sounds: Normal breath  sounds.  Chest:     Chest wall: Not tender to palpatation.  Cardiovascular:     PMI at left midclavicular line. Normal rate. Regular rhythm. Normal S1. Normal S2.      Murmurs: There is a grade 3/6 systolic murmur.     No gallop.  No click. No rub.  Pulses:    Intact distal pulses.  Edema:    Peripheral edema absent.  Musculoskeletal: Normal range of motion.     Cervical back: Normal range of motion and neck supple. Skin:    General: Skin is warm and dry.  Neurological:     General: No focal deficit present.     Mental Status: Alert, oriented to person, place, and time and oriented to person, place and time.  Psychiatric:        Attention and Perception: Attention and perception normal.        Mood and Affect: Mood normal.        Behavior: Behavior is cooperative.        Thought Content: Thought content normal.        Assessment and Plan:  HFrEF / Takotsubo cardiomyopathy / Type ll NSTEMI -Echo 07/26/23 showed biventricular systolic dysfunction consistent with Takotsubo cardiomyopathy, LVEF of 25-30%, to apical RV hypokinesis with RV function moderately reduced, trivial AS.  With nonobstructive LHC this is likely Takotsubo cardiomyopathy. -NYHA class ll. + Orthopnea, SOB, PND -Plan to start Entresto 49-51mg  twice daily, discontinue valsartan 80mg  -Plan to switch Lopressor 50 mg twice daily to Toprol-XL 100 mg daily -Repeat echocardiogram x1 month -GDMT: Entresto, Toprol-XL, Jardiance -Work on stress and anxiety reduction.  Less than 2 g sodium diet restriction  Coronary artery disease -s/p 1V CABG in 2010 -LHC 07/25/2023 with proximal LAD to mid LAD lesion 30% stenosed, Ost LM lesion is 20% stenosis, LIMA-LAD is small caliber but patent -LHC with nonobstructive CAD -No exertional angina, no indication for ischemic evaluation -Not on ASA due to Eliquis -Heart healthy/Mediterranean diet encouraged  Hypertension -BP today 124/80, currently controlled -With addition of  Entresto and Toprol-XL will have her follow blood pressure closely at home  Persistent atrial fibrillation -CHA2DS2-VASc Score = 6 [CHF History: 1, HTN History: 1, Diabetes History: 0, Stroke History: 0, Vascular Disease History: 1, Age Score: 2, Gender Score: 1].  Therefore, the patient's annual risk of stroke is 9.7 %.   -AV paced on EKG today -Continue Eliquis 5 mg twice daily, she does not meet dose reduction requirements  Hyperlipidemia -LDL 118 on 07/25/2023 -Was recently seen by Pharm.D. and approved for Repatha -Begin Repatha, continue ezetimibe 10 mg  CKD stage IIIb -Creatinine 1.35, GFR 40 on 07/24/2013.  This has been stable -Will continue to monitor             Cardiac Rehabilitation Eligibility Assessment  The patient is ready to start cardiac rehabilitation from a cardiac standpoint.     Dispo:  Return in about 5 weeks (around 09/07/2023).  Signed, Denyce Robert, NP

## 2023-08-02 NOTE — Telephone Encounter (Signed)
Pharmacy Patient Advocate Encounter   Received notification from  [facility] charts  that prior authorization for Repatha is required/requested.   Insurance verification completed.   The patient is insured through  [employer]  .   Per test claim: PA required; PA submitted to above mentioned insurance via CoverMyMeds Key/confirmation #/EOC [id number] Status is pending

## 2023-08-02 NOTE — Telephone Encounter (Signed)
-----   Message from Particia Lather sent at 08/01/2023  4:51 PM EST ----- Please initiate PA for Repatha. Hx of ASCVD (I25.10) and statin intolerance. Thank you. If approved, planning to enroll patient in [employer] fund if available.   Nils Pyle, PharmD PGY1 Pharmacy Resident

## 2023-08-02 NOTE — Telephone Encounter (Signed)
Pharmacy Patient Advocate Encounter  Received notification from  [employer]  that Prior Authorization for repatha has been APPROVED from 08/05/23 to 09/11/23-- will need to resubmit another soon . Ran test claim, Copay is $47.00-one month. This test claim was processed through [facility]- copay amounts may vary at other pharmacies due to pharmacy/plan contracts, or as the patient moves through the different stages of their insurance plan.   PA #/Case ID/Reference #: [id number]

## 2023-08-03 ENCOUNTER — Ambulatory Visit: Payer: Medicare HMO | Attending: Physician Assistant | Admitting: Emergency Medicine

## 2023-08-03 ENCOUNTER — Encounter: Payer: Self-pay | Admitting: Physician Assistant

## 2023-08-03 VITALS — BP 124/80 | HR 75 | Ht 62.0 in | Wt 212.4 lb

## 2023-08-03 DIAGNOSIS — N1832 Chronic kidney disease, stage 3b: Secondary | ICD-10-CM | POA: Diagnosis not present

## 2023-08-03 DIAGNOSIS — I10 Essential (primary) hypertension: Secondary | ICD-10-CM

## 2023-08-03 DIAGNOSIS — I4819 Other persistent atrial fibrillation: Secondary | ICD-10-CM

## 2023-08-03 DIAGNOSIS — I5020 Unspecified systolic (congestive) heart failure: Secondary | ICD-10-CM | POA: Diagnosis not present

## 2023-08-03 DIAGNOSIS — I5032 Chronic diastolic (congestive) heart failure: Secondary | ICD-10-CM | POA: Diagnosis not present

## 2023-08-03 DIAGNOSIS — I25119 Atherosclerotic heart disease of native coronary artery with unspecified angina pectoris: Secondary | ICD-10-CM

## 2023-08-03 DIAGNOSIS — I5181 Takotsubo syndrome: Secondary | ICD-10-CM

## 2023-08-03 DIAGNOSIS — E785 Hyperlipidemia, unspecified: Secondary | ICD-10-CM | POA: Diagnosis not present

## 2023-08-03 MED ORDER — METOPROLOL SUCCINATE ER 100 MG PO TB24
100.0000 mg | ORAL_TABLET | Freq: Every day | ORAL | 3 refills | Status: DC
Start: 2023-08-03 — End: 2023-11-08

## 2023-08-03 MED ORDER — ENTRESTO 49-51 MG PO TABS: 1.0000 | ORAL_TABLET | Freq: Two times a day (BID) | ORAL | 5 refills | Status: DC

## 2023-08-03 NOTE — Patient Instructions (Addendum)
Medication Instructions:  START METOPROLOL SUCCINATE 100 MG DAILY  START ENTRESTO 49-51 MG TWICE DAILY  Stop valsartan 80 mg daily  Stop metoprolol tartrate 50 mg twice daily  *If you need a refill on your cardiac medications before your next appointment, please call your pharmacy*   Lab Work: BMP IN 1-2 WEEKS If you have labs (blood work) drawn today and your tests are completely normal, you will receive your results only by: MyChart Message (if you have MyChart) OR A paper copy in the mail If you have any lab test that is abnormal or we need to change your treatment, we will call you to review the results.   Testing/Procedures:[address] Your physician has requested that you have an echocardiogram. Echocardiography is a painless test that uses sound waves to create images of your heart. It provides your doctor with information about the size and shape of your heart and how well your heart's chambers and valves are working. This procedure takes approximately one hour. There are no restrictions for this procedure. Please do NOT wear cologne, perfume, aftershave, or lotions (deodorant is allowed). Please arrive 15 minutes prior to your appointment time.  Please note: We ask at that you not bring children with you during ultrasound (echo/ vascular) testing. Due to room size and safety concerns, children are not allowed in the ultrasound rooms during exams. Our front office staff cannot provide observation of children in our lobby area while testing is being conducted. An adult accompanying a patient to their appointment will only be allowed in the ultrasound room at the discretion of the ultrasound technician under special circumstances. We apologize for any inconvenience.    Follow-Up: At [facility], you and your health needs are our priority.  As part of our continuing mission to provide you with exceptional heart care, we have created designated Provider Care  Teams.  These Care Teams include your primary Cardiologist (physician) and Advanced Practice Providers (APPs -  Physician Assistants and Nurse Practitioners) who all work together to provide you with the care you need, when you need it.  Your next appointment:   5 week(s)  Provider:   Azalee Course, PA

## 2023-08-04 DIAGNOSIS — R001 Bradycardia, unspecified: Secondary | ICD-10-CM | POA: Diagnosis not present

## 2023-08-06 MED ORDER — REPATHA SURECLICK 140 MG/ML ~~LOC~~ SOAJ: 140.0000 mg | SUBCUTANEOUS | 11 refills | Status: DC

## 2023-08-06 NOTE — Addendum Note (Signed)
Addended by: Particia Lather on: 08/06/2023 12:34 PM   Modules accepted: Orders

## 2023-08-06 NOTE — Telephone Encounter (Signed)
Enrolled in [facility] grant: ID [id number], BIN [id number], PCN: [id number], Grp: [id number]. Approved through 07/05/24.   Attempted to contact patient to notify of insurance approval of Repatha and enrollment in [employer] grant to bring copay to $0 per month. VM full, will continue to f/u.   Nils Pyle, PharmD PGY1 Pharmacy Resident

## 2023-08-06 NOTE — Telephone Encounter (Signed)
Successfully contacted pt to notify of Repatha approval. Confirmed $0 copay at [facility]. Pt will plan to initiate medication after her trip to [place] in early December. No questions or concerns at this time.   Nils Pyle, PharmD PGY1 Pharmacy Resident

## 2023-08-08 ENCOUNTER — Encounter (HOSPITAL_COMMUNITY): Payer: Self-pay

## 2023-08-15 ENCOUNTER — Other Ambulatory Visit: Payer: Medicare HMO

## 2023-08-20 ENCOUNTER — Telehealth: Payer: Self-pay | Admitting: Cardiovascular Disease

## 2023-08-20 DIAGNOSIS — Z79899 Other long term (current) drug therapy: Secondary | ICD-10-CM | POA: Diagnosis not present

## 2023-08-20 NOTE — Telephone Encounter (Signed)
Paper Work Dropped Off: Patient Asisstance  Date:08/20/2023  Location of paper: Dr Clifton James mail box

## 2023-08-21 ENCOUNTER — Telehealth: Payer: Self-pay

## 2023-08-21 ENCOUNTER — Telehealth (HOSPITAL_COMMUNITY): Payer: Self-pay

## 2023-08-21 LAB — BASIC METABOLIC PANEL
BUN/Creatinine Ratio: 16 (ref 12–28)
BUN: 23 mg/dL (ref 8–27)
CO2: 20 mmol/L (ref 20–29)
Calcium: 9.5 mg/dL (ref 8.7–10.3)
Chloride: 105 mmol/L (ref 96–106)
Creatinine, Ser: 1.42 mg/dL — ABNORMAL HIGH (ref 0.57–1.00)
Glucose: 92 mg/dL (ref 70–99)
Potassium: 4.4 mmol/L (ref 3.5–5.2)
Sodium: 142 mmol/L (ref 134–144)
eGFR: 38 mL/min/{1.73_m2} — ABNORMAL LOW (ref >59)

## 2023-08-21 NOTE — Telephone Encounter (Signed)
 Patient Advocate Encounter   The patient was approved for a [employer] grant that will help cover the cost of ENTRESTO Total amount awarded, $10,000.  Effective: 06/10/23 - 06/08/24   [id number] YQM:[id number]   Haze Rushing, CPhT  Pharmacy Patient Advocate Specialist  Direct Number: [phone] Fax: [phone]

## 2023-08-21 NOTE — Telephone Encounter (Signed)
Pt called in regards to CR, adv pt details about CR and her co-pay. Pt stated she would like to hold off doing CR at this time due to her co-pay, did adv pt if she would like she can do 2 days a week.   Closed referral

## 2023-08-21 NOTE — Telephone Encounter (Signed)
Received, thank you so much.

## 2023-08-21 NOTE — Telephone Encounter (Signed)
Patient assistance forms for Jardiance and Eliquis signed and faxed to Rx tech [employer].

## 2023-08-22 ENCOUNTER — Other Ambulatory Visit (HOSPITAL_COMMUNITY): Payer: Self-pay

## 2023-08-22 ENCOUNTER — Ambulatory Visit: Payer: Medicare HMO | Admitting: Physician Assistant

## 2023-08-22 ENCOUNTER — Other Ambulatory Visit: Payer: Self-pay | Admitting: Internal Medicine

## 2023-08-22 NOTE — Telephone Encounter (Signed)
PAP: Application for [place] Pepper has been submitted to PAP Companies: [employer], via fax.  *PLEASE NOTE: [employer] is STILL very behind on processing applications currently and have been taking 3 weeks or longer to get back with determinations (please be mindful this is renewal season)  If patient requests update in the meantime, please refer them to [phone].

## 2023-08-23 DIAGNOSIS — Z5189 Encounter for other specified aftercare: Secondary | ICD-10-CM | POA: Diagnosis not present

## 2023-08-23 DIAGNOSIS — G4733 Obstructive sleep apnea (adult) (pediatric): Secondary | ICD-10-CM | POA: Diagnosis not present

## 2023-08-24 NOTE — Progress Notes (Unsigned)
I have seen patient along with Layna and agree with note

## 2023-08-27 ENCOUNTER — Encounter: Payer: Self-pay | Admitting: Cardiovascular Disease

## 2023-08-28 DIAGNOSIS — E538 Deficiency of other specified B group vitamins: Secondary | ICD-10-CM | POA: Diagnosis not present

## 2023-08-31 LAB — LAB REPORT - SCANNED: A1c: 5.7

## 2023-09-03 NOTE — Telephone Encounter (Signed)
PAP: Patient assistance application for London Pepper has been approved by PAP Companies: [employer] from 09/02/23 to 09/10/24. Medication should be delivered to PAP Delivery: Home For further shipping updates, please contact [employer]) at [phone] Pt ID is: [id number]

## 2023-09-06 ENCOUNTER — Ambulatory Visit (HOSPITAL_COMMUNITY): Payer: Medicare HMO | Attending: Emergency Medicine

## 2023-09-06 ENCOUNTER — Other Ambulatory Visit: Payer: Self-pay

## 2023-09-06 ENCOUNTER — Other Ambulatory Visit (HOSPITAL_COMMUNITY): Payer: Self-pay

## 2023-09-06 DIAGNOSIS — I5020 Unspecified systolic (congestive) heart failure: Secondary | ICD-10-CM | POA: Insufficient documentation

## 2023-09-06 DIAGNOSIS — I5181 Takotsubo syndrome: Secondary | ICD-10-CM | POA: Diagnosis not present

## 2023-09-06 DIAGNOSIS — I5032 Chronic diastolic (congestive) heart failure: Secondary | ICD-10-CM | POA: Insufficient documentation

## 2023-09-06 LAB — ECHOCARDIOGRAM COMPLETE
AR max vel: 1.36 cm2
AV Area VTI: 1.32 cm2
AV Area mean vel: 1.31 cm2
AV Mean grad: 20 mm[Hg]
AV Peak grad: 37 mm[Hg]
Ao pk vel: 3.04 m/s
Area-P 1/2: 3.87 cm2
S' Lateral: 2.3 cm

## 2023-09-06 MED ORDER — PERFLUTREN LIPID MICROSPHERE
1.0000 mL | INTRAVENOUS | Status: AC | PRN
  Administered 2023-09-06: 2 mL via INTRAVENOUS

## 2023-09-07 ENCOUNTER — Other Ambulatory Visit: Payer: Self-pay

## 2023-09-07 DIAGNOSIS — I5020 Unspecified systolic (congestive) heart failure: Secondary | ICD-10-CM

## 2023-09-11 ENCOUNTER — Telehealth: Payer: Self-pay | Admitting: Cardiovascular Disease

## 2023-09-11 ENCOUNTER — Ambulatory Visit: Payer: Medicare HMO | Admitting: Physician Assistant

## 2023-09-11 NOTE — Telephone Encounter (Signed)
Pt dropped off [employer] patient assistance form. Copies have been made and put in blue abc folder. Pw will be put in providers box by eod.

## 2023-09-13 NOTE — Telephone Encounter (Signed)
 Pt is checking on status of patient assistant. Pt would like a call

## 2023-09-13 NOTE — Telephone Encounter (Signed)
 Call placed to patient but mailbox is full.  Unable to leave a message.  Paperwork is in Dr [employer]

## 2023-09-14 ENCOUNTER — Telehealth: Payer: Self-pay | Admitting: Cardiovascular Disease

## 2023-09-14 ENCOUNTER — Ambulatory Visit (INDEPENDENT_AMBULATORY_CARE_PROVIDER_SITE_OTHER): Payer: Medicare HMO

## 2023-09-14 DIAGNOSIS — I441 Atrioventricular block, second degree: Secondary | ICD-10-CM | POA: Diagnosis not present

## 2023-09-14 LAB — CUP PACEART REMOTE DEVICE CHECK
Battery Remaining Longevity: 125 mo
Battery Voltage: 3 V
Brady Statistic AP VP Percent: 91.33 %
Brady Statistic AP VS Percent: 0.05 %
Brady Statistic AS VP Percent: 6.34 %
Brady Statistic AS VS Percent: 2.28 %
Brady Statistic RA Percent Paced: 91.39 %
Brady Statistic RV Percent Paced: 97.67 %
Date Time Interrogation Session: 20250103023809
Implantable Lead Connection Status: 753985
Implantable Lead Connection Status: 753985
Implantable Lead Implant Date: 20210707
Implantable Lead Implant Date: 20210707
Implantable Lead Location: 753859
Implantable Lead Location: 753860
Implantable Lead Model: 5076
Implantable Lead Model: 5076
Implantable Lead Serial Number: 8264443
Implantable Pulse Generator Implant Date: 20210707
Lead Channel Impedance Value: 342 Ohm
Lead Channel Impedance Value: 380 Ohm
Lead Channel Impedance Value: 437 Ohm
Lead Channel Impedance Value: 494 Ohm
Lead Channel Pacing Threshold Amplitude: 0.75 V
Lead Channel Pacing Threshold Amplitude: 0.875 V
Lead Channel Pacing Threshold Pulse Width: 0.4 ms
Lead Channel Pacing Threshold Pulse Width: 0.4 ms
Lead Channel Sensing Intrinsic Amplitude: 3.375 mV
Lead Channel Sensing Intrinsic Amplitude: 3.375 mV
Lead Channel Sensing Intrinsic Amplitude: 8.375 mV
Lead Channel Sensing Intrinsic Amplitude: 8.375 mV
Lead Channel Setting Pacing Amplitude: 1.5 V
Lead Channel Setting Pacing Amplitude: 2.5 V
Lead Channel Setting Pacing Pulse Width: 0.4 ms
Lead Channel Setting Sensing Sensitivity: 2.8 mV
Zone Setting Status: 755011
Zone Setting Status: 755011

## 2023-09-14 NOTE — Telephone Encounter (Signed)
 Patient returning call to nurse

## 2023-09-14 NOTE — Telephone Encounter (Signed)
 Attempted to leave pt a message letting her know Dr Clifton James is out of office (and nurse) until Monday 09/17/23 and pt assistance will be addressed then, but no answer and no voicemail due to box full. Will address at earliest convenience.

## 2023-09-14 NOTE — Telephone Encounter (Signed)
 Returned call to patient who had called back while I was on another line. She states Monday will be fine. She has enough medication to last for about 2 weeks.

## 2023-09-18 ENCOUNTER — Telehealth: Payer: Self-pay

## 2023-09-18 NOTE — Telephone Encounter (Signed)
 Received, thank you

## 2023-09-18 NOTE — Telephone Encounter (Addendum)
 PAP: Application for Sherryll Burger has been submitted to PAP Companies: [employer], via fax. If patient requests an update in the meantime please refer them to [employer] at [phone]

## 2023-09-23 DIAGNOSIS — G4733 Obstructive sleep apnea (adult) (pediatric): Secondary | ICD-10-CM | POA: Diagnosis not present

## 2023-09-24 ENCOUNTER — Telehealth: Payer: Self-pay | Admitting: Cardiovascular Disease

## 2023-09-24 ENCOUNTER — Encounter: Payer: Self-pay | Admitting: Internal Medicine

## 2023-09-24 ENCOUNTER — Ambulatory Visit: Payer: Medicare HMO | Attending: Internal Medicine | Admitting: Internal Medicine

## 2023-09-24 VITALS — BP 124/78 | HR 78 | Ht 62.0 in | Wt 212.4 lb

## 2023-09-24 DIAGNOSIS — Z950 Presence of cardiac pacemaker: Secondary | ICD-10-CM

## 2023-09-24 DIAGNOSIS — I4819 Other persistent atrial fibrillation: Secondary | ICD-10-CM | POA: Diagnosis not present

## 2023-09-24 DIAGNOSIS — I441 Atrioventricular block, second degree: Secondary | ICD-10-CM

## 2023-09-24 MED ORDER — NITROGLYCERIN 0.4 MG SL SUBL: SUBLINGUAL_TABLET | SUBLINGUAL | 3 refills | Status: AC

## 2023-09-24 NOTE — Telephone Encounter (Signed)
 Called patient and I reviewed w her the updates in the chart for Jardiance  and Entresto .  Needs update on Eliquis  with [employer] [place].  Per telephone note on 08/20/23 the signed completed forms were faxed to [facility].   I will route to Pt assistance pool to check on the Eliquis  application.  Pt aware and appreciative for assistance.

## 2023-09-24 NOTE — Telephone Encounter (Signed)
 Patient is requesting to speak with a nurse in regards to her patient assistance. Patient stated she is confused because 2 out of 3 applications where not received and she was unsure as to what is happening to the third application. Please advise.

## 2023-09-24 NOTE — Patient Instructions (Signed)
 Medication Instructions:  Your physician recommends that you continue on your current medications as directed. Please refer to the Current Medication list given to you today.  *If you need a refill on your cardiac medications before your next appointment, please call your pharmacy*   Lab Work: None ordered   Testing/Procedures: None ordered   Follow-Up: At [facility], you and your health needs are our priority.  As part of our continuing mission to provide you with exceptional heart care, we have created designated Provider Care Teams.  These Care Teams include your primary Cardiologist (physician) and Advanced Practice Providers (APPs -  Physician Assistants and Nurse Practitioners) who all work together to provide you with the care you need, when you need it.  Your next appointment:   1 year(s)  The format for your next appointment:   In Person  Provider:   You may see Elspeth Sage, MD or one of the following Advanced Practice Providers on your designated Care Team:   Charlies Arthur, PA-C Michael Andy Tillery, PA-C Suzann Riddle, NP Daphne Barrack, NP{   Thank you for choosing [facility]!!   [phone]

## 2023-09-24 NOTE — Progress Notes (Signed)
 Patient Care Team: Verena Mems, MD as PCP - General (Family Medicine) Verlin Lonni BIRCH, MD as PCP - Cardiology (Cardiology) Fernande Elspeth BROCKS, MD as PCP - Electrophysiology (Cardiology) Onesimo Emaline Brink, MD as Consulting Physician (Hematology)   HPI  Samantha Short is a 78 y.o. female seen in followup for pacemaker Medtronic implanted 7/21 for symptomatic second-degree heart block and intermittent complete heart block.  Procedure was complicated by microperforation requiring lead revision.  Coronary artery disease with catheterization 2010 demonstrating LAD proximal stenosis 80% for which she underwent LIMA-to the LAD  6/24 hospitalized with Afib RVR manifesting with chest pain; spontaneous conversion and then reversion.  Amio initiated.    Spiro added and stopped 2/2 hyperkalemia  10/20 for complaints of increasing exercise intolerance (swimming laps down from 10-25--2) associated with 100% ventricular pacing accompanied by palpitations and chest discomfort.  A mesenteric calcified mass was noted incidentally; laparoscopic biopsies apparently are negative.  Oncology is to be seen tomorrow.  Felt much better since device reprogramming  Interval hospitalization 11/24 for chest pain non-STEMI with a presumptive diagnosis of Tako-Tsubo Now with metoprolol  succinate and Entresto .  And feeling great   She relates the incredible stress of living at home with her daughter and her autistic adopted grandson who is nonverbal and violent.>>  This is been better over the last couple of weeks      DATE TEST EF   7/21 Echo and  65% %   7/21 cMRI  79 catheter % No amyloid  7/21 LHC  LIMA-LAD patent but small LADp20% Ao Stenosis mean Grad   8/23 Echo 65-70% AoStenosis  Mean Grad 23  4/24 Echo  60-65% Ao Stenosis Mean Grad 17  11/24 Echo  25-30% Ao Stenosis mean Grad 21  12/24 Echo  60-65%      Date Cr K Hgb TSH LFTs  7/22 1.35 4.2 13.8 8.4   8/23    3.98    10/24  1.32 4.7<<5.8 13.3   33  12/24 1.42 4.4 12.5     Thromboembolic risk factors ( age  -2, HTN-1, Vasc disease -1, Gender-1) for a CHADSVASc Score of >=5    Records and Results Reviewed   Past Medical History:  Diagnosis Date   Anemia    pernicious   Anxiety    Arthritis    Asthma    Chronic diastolic CHF (congestive heart failure) (HCC)    a. 02/2011 Echo: EF 55-60%, Gr2 DD, Mild MR   Chronic kidney disease    stage 3   Complication of anesthesia    Coronary artery disease    a. s/p CABG x 1 2010:  LIMA->LAD.;  b. amdx for CP => LHC 07/04/12: LAD 70-80%, mid RCA 30%, LIMA-LAD patent with competitive flow limiting distal LAD filling, EF 70% with hyperdynamic LV function. Medical therapy continued.   Depression    Dyslipidemia    GERD (gastroesophageal reflux disease)    Headache    hx of migraines   Heart murmur    History of hiatal hernia    History of kidney stones    Hyperlipidemia    Hypertension    Hyperthyroidism    Hypothyroidism    Mild aortic stenosis    Mitral regurgitation    a. mild by echo 02/2011.   PAF (paroxysmal atrial fibrillation) (HCC)    Pneumonia    PONV (postoperative nausea and vomiting)    Pre-diabetes    Presence of permanent cardiac pacemaker  Sleep apnea    diagnosed 08-2022    Past Surgical History:  Procedure Laterality Date   ABDOMINAL HYSTERECTOMY  1980   CARDIAC CATHETERIZATION  07/27/09 & 07/28/09   CESAREAN SECTION     x 2   CORONARY ARTERY BYPASS GRAFT  08/03/2009   x1 using left internal mammary artery to distal left anterior  descending coronary artery.    GALLBLADDER SURGERY  2001   LAPAROSCOPY N/A 03/19/2023   Procedure: LAPAROSCOPY DIAGNOSTIC;  Surgeon: Dasie Leonor CROME, MD;  Location: [facility] OR;  Service: General;  Laterality: N/A;   LEFT HEART CATH AND CORS/GRAFTS ANGIOGRAPHY N/A 07/25/2023   Procedure: LEFT HEART CATH AND CORS/GRAFTS ANGIOGRAPHY;  Surgeon: Darron Deatrice LABOR, MD;  Location: [facility] INVASIVE CV LAB;  Service:  Cardiovascular;  Laterality: N/A;   LEFT HEART CATHETERIZATION WITH CORONARY/GRAFT ANGIOGRAM N/A 07/05/2012   Procedure: LEFT HEART CATHETERIZATION WITH EL BILE;  Surgeon: Maude JAYSON Emmer, MD;  Location: [facility] CATH LAB;  Service: Cardiovascular;  Laterality: N/A;   MASS EXCISION N/A 03/19/2023   Procedure: BIOPSY OF MESENTERIC MASS;  Surgeon: Dasie Leonor CROME, MD;  Location: [facility] OR;  Service: General;  Laterality: N/A;   PACEMAKER IMPLANT N/A 03/17/2020   Procedure: PACEMAKER IMPLANT;  Surgeon: Fernande Elspeth JAYSON, MD;  Location: [facility] INVASIVE CV LAB;  Service: Cardiovascular;  Laterality: N/A;   RIGHT/LEFT HEART CATH AND CORONARY/GRAFT ANGIOGRAPHY N/A 03/17/2020   Procedure: RIGHT/LEFT HEART CATH AND CORONARY/GRAFT ANGIOGRAPHY;  Surgeon: Darron Deatrice LABOR, MD;  Location: [facility] INVASIVE CV LAB;  Service: Cardiovascular;  Laterality: N/A;   SHOULDER SURGERY  1998 / 2001   from accident   rotator cuff   TOTAL HIP ARTHROPLASTY Right 08/30/2022   Procedure: TOTAL HIP ARTHROPLASTY ANTERIOR APPROACH;  Surgeon: Melodi Lerner, MD;  Location: [facility] ORS;  Service: Orthopedics;  Laterality: Right;   VESICOVAGINAL FISTULA CLOSURE W/ TAH  1998    Current Meds  Medication Sig   acetaminophen  (TYLENOL ) 650 MG CR tablet Take 650 mg by mouth every 8 (eight) hours as needed for pain.   apixaban  (ELIQUIS ) 5 MG TABS tablet Take 1 tablet (5 mg total) by mouth 2 (two) times daily.   cholecalciferol  (VITAMIN D3) 25 MCG (1000 UNIT) tablet Take 1,000 Units by mouth daily.   cyanocobalamin  (,VITAMIN B-12,) 1000 MCG/ML injection Inject 1,000 mcg into the muscle every 30 (thirty) days.   diltiazem  (CARDIZEM ) 120 MG tablet Take 120 mg by mouth daily.   empagliflozin  (JARDIANCE ) 10 MG TABS tablet Take 1 tablet (10 mg total) by mouth daily before breakfast.   Evolocumab  (REPATHA  SURECLICK) 140 MG/ML SOAJ Inject 140 mg into the skin every 14 (fourteen) days.   ezetimibe  (ZETIA ) 10 MG tablet Take 1 tablet (10 mg total) by  mouth daily.   famotidine (PEPCID) 20 MG tablet Take 20 mg by mouth daily as needed for heartburn or indigestion.   furosemide  (LASIX ) 20 MG tablet Take 20 mg daily alternating with 40 mg every other day (Patient taking differently: Take 20-40 mg by mouth daily. Take 20 mg daily alternating with 40 mg every other day)   levothyroxine  (SYNTHROID ) 88 MCG tablet Take 88 mcg by mouth daily before breakfast.   loratadine  (CLARITIN ) 10 MG tablet Take 10 mg by mouth daily.   metoprolol  succinate (TOPROL -XL) 100 MG 24 hr tablet Take 1 tablet (100 mg total) by mouth daily. Take with or immediately following a meal.   Multiple Vitamin (MULTIVITAMIN WITH MINERALS) TABS tablet Take 1 tablet by mouth daily.  Multiple Vitamins-Minerals (PRESERVISION AREDS 2) CAPS Take 1 capsule by mouth 2 (two) times daily.   nitroGLYCERIN  (NITROSTAT ) 0.4 MG SL tablet PLACE 1 TABLET UNDER THE TONGUE EVERY 5 MINUTES AS NEEDED FOR CHEST PAIN   pantoprazole  (PROTONIX ) 40 MG tablet Take 40 mg by mouth daily.   Polyethylene Glycol 400 (BLINK TEARS OP) Place 1 drop into both eyes daily as needed (dry eyes).   sacubitril -valsartan  (ENTRESTO ) 49-51 MG Take 1 tablet by mouth 2 (two) times daily.    Allergies  Allergen Reactions   Ceclor [Cefaclor] Other (See Comments)    Lost vision in eye   Hctz [Hydrochlorothiazide] Other (See Comments)    Renal insufficiency   Lipitor [Atorvastatin  Calcium ] Other (See Comments)    Increased A1C   Norvasc  [Amlodipine ] Other (See Comments)    Makes patient stiff   Penicillins Hives, Shortness Of Breath, Itching and Rash   Sulfa  Antibiotics Other (See Comments)    CAUSES SHOCK   Milk-Related Compounds Diarrhea and Nausea And Vomiting   Statins Other (See Comments)    MYALGIAS AND WEAKNESS   Erythromycin Rash   Latex Other (See Comments)    Patient reports she had vomiting after shoulder surgery- was told it was possibly from latex. Denies having a latex rash.   Levofloxacin  Rash    Tetracyclines & Related Rash      Review of Systems negative except from HPI and PMH  Physical Exam  BP 124/78   Pulse 78   Ht 5' 2 (1.575 m)   Wt 212 lb 6.4 oz (96.3 kg)   SpO2 99%   BMI 38.85 kg/m  Well developed and well nourished in no acute distress HENT normal Neck supple with JVP-flat Clear Device pocket well healed; without hematoma or erythema.  There is no tethering  Regular rate and rhythm, no  gallop No murmur Abd-soft with active BS No Clubbing cyanosis  edema Skin-warm and dry A & Oriented  Grossly normal sensory and motor function  ECG AV pacing with an upright QRS lead I and a Rs in lead V1      CrCl cannot be calculated (Patient's most recent lab result is older than the maximum 21 days allowed.).   Assessment and  Plan Heart block-second-degree intermittent   Sinus bradycardia-iatrogenic  Pacemaker-dual-chamber-Medtronic 1  Chest pain with ventricular pacing   Dyspnea on exertion    Coronary artery disease-single-vessel CABG  Takotsubo cardiomyopathy-resolved 11/24   Aortic stenosis-mild to moderate   Renal insufficiency    Atrial fibrillation persistent  Psychosocial stress   Overall feeling much better following device reprogramming with limited heart rate excursion.  Also, following hospitalization for Tako-Tsubo now with recovered cardiomyopathy on GDMT.  No significant interval atrial fibrillation.  No bleeding on her Eliquis  will continue.  Aortic stenosis mild-moderate.  Will follow

## 2023-09-25 ENCOUNTER — Other Ambulatory Visit (HOSPITAL_COMMUNITY): Payer: Self-pay

## 2023-09-26 ENCOUNTER — Encounter: Payer: Self-pay | Admitting: Pharmacy Technician

## 2023-09-27 ENCOUNTER — Telehealth: Payer: Self-pay | Admitting: Pharmacy Technician

## 2023-09-27 NOTE — Telephone Encounter (Signed)
PAP: Patient assistance application Entresto for has been approved by PAP Companies: [employer] from 09/25/23 to 09/10/24. Patient aware via mychart

## 2023-09-27 NOTE — Telephone Encounter (Signed)
PAP: Application for Eliquis has been submitted to [employer] ([employer]), via fax

## 2023-09-27 NOTE — Telephone Encounter (Signed)
RE: entresto patient assistance approval Received: Today Willette Cluster, CPhT  Aggie Hacker M, LPN I called [employer] and the eliquis application is still in process. I faxed it 09/26/23. They said it takes up to 72 hours to review application. Thank you

## 2023-09-27 NOTE — Telephone Encounter (Signed)
Patient messaged to check on her BMS application. I called [employer] and the application is still in process. Messaged back in staff messages.

## 2023-09-28 ENCOUNTER — Encounter: Payer: Self-pay | Admitting: Pharmacy Technician

## 2023-09-28 ENCOUNTER — Telehealth: Payer: Self-pay | Admitting: Pharmacy Technician

## 2023-09-28 DIAGNOSIS — E538 Deficiency of other specified B group vitamins: Secondary | ICD-10-CM | POA: Diagnosis not present

## 2023-09-28 NOTE — Telephone Encounter (Signed)
PAP: Patient has been denied for pt assistance by [employer] ([employer]) due to the below reason  pt made aware via mychart

## 2023-09-28 NOTE — Telephone Encounter (Signed)
Patient assistance forms has been Denied. New Encounter created for follow up. For additional info see Pharmacy medication assitance telephone encounter from 09/28/23.

## 2023-10-01 NOTE — Telephone Encounter (Signed)
Approved, see separate encounter.

## 2023-10-04 ENCOUNTER — Encounter: Payer: Self-pay | Admitting: Emergency Medicine

## 2023-10-04 ENCOUNTER — Ambulatory Visit
Admission: EM | Admit: 2023-10-04 | Discharge: 2023-10-04 | Disposition: A | Discharge status: Home / Self Care | Payer: Medicare HMO

## 2023-10-04 ENCOUNTER — Other Ambulatory Visit: Payer: Self-pay

## 2023-10-04 DIAGNOSIS — B029 Zoster without complications: Secondary | ICD-10-CM

## 2023-10-04 MED ORDER — VALACYCLOVIR HCL 1 G PO TABS: 1000.0000 mg | ORAL_TABLET | Freq: Three times a day (TID) | ORAL | 0 refills | Status: DC

## 2023-10-04 NOTE — ED Triage Notes (Signed)
Pt reports bloating w/ excessive gas, distension, and constipation x2 weeks. Pt reports intermittent SOB, but states that "is essentially her normal baseline from her heart conditions nothing new." Pt reports red rash to RLQ x3 days with worsening abdominal pain. Pt reports the abdominal pain is what brought her in - it is severe and wraps from RUQ & RLQ to her back. Took an oxycodone this morning to relieve pain but it is wearing off. Pt believes she is having an allergic reaction to pantoprazole (GI MD prescribed it 6 months ago). Last dose taken was 1/19.

## 2023-10-10 ENCOUNTER — Encounter: Payer: Self-pay | Admitting: Internal Medicine

## 2023-10-11 ENCOUNTER — Ambulatory Visit: Payer: Self-pay | Admitting: Family Medicine

## 2023-10-11 ENCOUNTER — Telehealth: Payer: Self-pay | Admitting: *Deleted

## 2023-10-11 NOTE — Telephone Encounter (Signed)
  Chief Complaint: Shingles Symptoms: Pain Frequency: Chronic Pertinent Negatives: Patient denies shortness of breath, chest pain Disposition: [] ED /[] Urgent Care (no appt availability in office) / [] Appointment(In office/virtual)/ []  [place] [facility]/ [] Home Care/ [] Refused Recommended Disposition /[] [place] [facility]/ []  Follow-up with PCP Additional Notes: PK is a 78 year old female being triaged for pain related to shingles that she describes as moderate-severe. The patient is currently being treated with antivirals. Reiterated education regarding taking Tylenol for pain, and offered a virtual appointment with the assistance of her daughter. The patient did not want to continue triage and stated, "I'll just call back."   Reason for Disposition  [1] Shingles rash already diagnosed and [2] taking antiviral medication  Answer Assessment - Initial Assessment Questions 1. APPEARANCE of RASH: "Describe the rash."      Reddened, Angry  2. LOCATION: "Where is the rash located?"      Back  3. ONSET: "When did the rash start?"       Ongoing  4. ITCHING: "Does the rash itch?" If Yes, ask: "How bad is the itch?"  (Scale 1-10; or mild, moderate, severe)     Mild  5. PAIN: "Does the rash hurt?" If Yes, ask: "How bad is the pain?"  (Scale 0-10; or none, mild, moderate, severe)    - NONE (0): no pain    - MILD (1-3): doesn't interfere with normal activities     - MODERATE (4-7): interferes with normal activities or awakens from sleep     - SEVERE (8-10): excruciating pain, unable to do any normal activities     6  6. OTHER SYMPTOMS: "Do you have any other symptoms?" (e.g., fever)     No  7. PREGNANCY: "Is there any chance you are pregnant?" "When was your last menstrual period?"     No  Protocols used: Shingles (Zoster)-A-AH

## 2023-10-11 NOTE — Telephone Encounter (Signed)
PHONE CALL: MRN [id number]. Laidlaw, P called in regards to shingles outbreak. She was given a prescription and is almost out, only 3 pills left, and her back is still in bad shape. Wants to know if she can get a refill. Pharmacy is [facility] [address]  Best callback # [phone]  Attempted to return patient's call. No answer, unable to leave VM due to mailbox being full

## 2023-10-12 ENCOUNTER — Telehealth: Payer: Self-pay | Admitting: Cardiology

## 2023-10-12 NOTE — Telephone Encounter (Signed)
Patient states she recently switched insurance and they are requesting documentation on her most recent hospital admission for coverage. They need the dates she was hospitalized + diagnosis. She would like to know if Dr. Herbie Baltimore is able to assist with completing this portion of her paperwork. She states while she was admitted she saw Dr. Herbie Baltimore and he ordered testing for her. Please advise.

## 2023-10-12 NOTE — Telephone Encounter (Signed)
Called and spoke to patient. Verified name and DOB. Patient was told she could drop papers off at check in and they would forward them to Dr Herbie Baltimore. Patient would like them faxed to the insurance company when completed.

## 2023-10-15 ENCOUNTER — Telehealth: Payer: Self-pay | Admitting: Cardiology

## 2023-10-15 NOTE — Telephone Encounter (Signed)
Spoke w/pt in regards to [employer] paperwork received [place]. Pt will come into office tomorrow, 10-16-23,to complete 2 forms (ROI & Billing) + $29 fee.   Paperwork left in Dr. Elissa Hefty mailbox.  JB, 10-15-23

## 2023-10-16 DIAGNOSIS — Z0279 Encounter for issue of other medical certificate: Secondary | ICD-10-CM

## 2023-10-17 NOTE — Telephone Encounter (Signed)
[employer] form placed in Dr. Gibson Ramp box.  Patient paid his forms fee at [facility].  When form has been completed, I will notify billing to post the charge and for [employer] to post payment.

## 2023-10-17 NOTE — Telephone Encounter (Signed)
 Pt came to office yesterday, 10-16-23, to complete 2 FMLA forms + pay $29 fee.  Per Dr. Addie Holstein & Ernesto Heady., pt sees Dr. Abel Hoe. Paperwork faxed to [address] [place] W: [phone]) for completion.  JB, 10-17-23

## 2023-10-19 NOTE — Telephone Encounter (Signed)
 [employer]-disability form scanned to chart. Patient and billing notified.

## 2023-10-19 NOTE — Telephone Encounter (Signed)
 Paperwork completed and signed by Dr. Abel Hoe.  Placed in basket in front office for processing.

## 2023-10-22 NOTE — Progress Notes (Signed)
 Remote pacemaker transmission.

## 2023-10-25 DIAGNOSIS — E538 Deficiency of other specified B group vitamins: Secondary | ICD-10-CM | POA: Diagnosis not present

## 2023-11-05 ENCOUNTER — Telehealth: Payer: Self-pay | Admitting: Pharmacy Technician

## 2023-11-05 ENCOUNTER — Encounter: Payer: Self-pay | Admitting: Internal Medicine

## 2023-11-05 NOTE — Telephone Encounter (Signed)
 Tier exception request was denied. Scan is in media.

## 2023-11-06 DIAGNOSIS — Z8679 Personal history of other diseases of the circulatory system: Secondary | ICD-10-CM | POA: Diagnosis not present

## 2023-11-06 DIAGNOSIS — D3A098 Benign carcinoid tumors of other sites: Secondary | ICD-10-CM | POA: Diagnosis not present

## 2023-11-06 DIAGNOSIS — K668 Other specified disorders of peritoneum: Secondary | ICD-10-CM | POA: Diagnosis not present

## 2023-11-06 DIAGNOSIS — Z950 Presence of cardiac pacemaker: Secondary | ICD-10-CM | POA: Diagnosis not present

## 2023-11-06 DIAGNOSIS — Z862 Personal history of diseases of the blood and blood-forming organs and certain disorders involving the immune mechanism: Secondary | ICD-10-CM | POA: Diagnosis not present

## 2023-11-06 DIAGNOSIS — K219 Gastro-esophageal reflux disease without esophagitis: Secondary | ICD-10-CM | POA: Diagnosis not present

## 2023-11-06 DIAGNOSIS — Z800 Family history of malignant neoplasm of digestive organs: Secondary | ICD-10-CM | POA: Diagnosis not present

## 2023-11-07 NOTE — Progress Notes (Unsigned)
 No chief complaint on file.  History of Present Illness: 78 yo female with history of CAD s/p 1V CABG in 2010, HTN, HLD, GERD, mitral regurgitation, chronic diastolic CHF and high grade AV block s/p permanent pacemaker placement, persistent atrial fibrillation, stress induced cardiomyopathy in November 2024 who is here today for cardiac follow up. She was admitted 06/2012 with chest pain and ruled out for MI. Cardiac cath October 2013 with LAD 70-80%, mid RCA 30%, LIMA-LAD patent. She does not tolerate statins and has not tolerated HCTZ or Norvasc in the past. She is on Repatha. Echo May 2020 with LVEF=60-65%. Mild aortic stenosis (mean gradient 16 mmHg). Mild mitral regurgitation. She has been diagnosed with hyperthyroidism and has undergone ablation. Admitted July 2021 with intermittent complete heart block. Cardiac cath with stable CAD, patent LIMA to LAD. Permanent pacemaker placed July 2021. Echo July 2021 with LVEF=60-65%, mild MR, mild AS. Normal ABI 2021. She was admitted to [facility] in November 2024 with chest pain. Troponin elevated to 973. Cardiac cath with mild disease in the LAD. Patent LIMA to LAD. Echo with LVEF=25-30% with apical hypokinesis. Mild MR. Mild to moderate AS. She was felt to have a stress induced cardiomyopathy. Repeat echo December 2024 with LVEF=60-65%. Mild MR. Moderate AS with mean gradient 20 mm Hg. AVA 1.3 cm2. SVI 48. DI 0.42.   She is here today for follow up. The patient denies any chest pain, dyspnea, palpitations, lower extremity edema, orthopnea, PND, dizziness, near syncope or syncope.      Primary Care Physician: Mila Palmer, MD  Past Medical History:  Diagnosis Date   Anemia    pernicious   Anxiety    Arthritis    Asthma    Chronic diastolic CHF (congestive heart failure) (HCC)    a. 02/2011 Echo: EF 55-60%, Gr2 DD, Mild MR   Chronic kidney disease    stage 3   Complication of anesthesia    Coronary artery disease    a. s/p CABG x 1 2010:   LIMA->LAD.;  b. amdx for CP => LHC 07/04/12: LAD 70-80%, mid RCA 30%, LIMA-LAD patent with competitive flow limiting distal LAD filling, EF 70% with hyperdynamic LV function. Medical therapy continued.   Depression    Dyslipidemia    GERD (gastroesophageal reflux disease)    Headache    hx of migraines   Heart murmur    History of hiatal hernia    History of kidney stones    Hyperlipidemia    Hypertension    Hyperthyroidism    Hypothyroidism    Mild aortic stenosis    Mitral regurgitation    a. mild by echo 02/2011.   PAF (paroxysmal atrial fibrillation) (HCC)    Pneumonia    PONV (postoperative nausea and vomiting)    Pre-diabetes    Presence of permanent cardiac pacemaker    Sleep apnea    diagnosed 08-2022    Past Surgical History:  Procedure Laterality Date   ABDOMINAL HYSTERECTOMY  1980   CARDIAC CATHETERIZATION  07/27/09 & 07/28/09   CESAREAN SECTION     x 2   CORONARY ARTERY BYPASS GRAFT  08/03/2009   x1 using left internal mammary artery to distal left anterior  descending coronary artery.    GALLBLADDER SURGERY  2001   LAPAROSCOPY N/A 03/19/2023   Procedure: LAPAROSCOPY DIAGNOSTIC;  Surgeon: Fritzi Mandes, MD;  Location: [facility] OR;  Service: General;  Laterality: N/A;   LEFT HEART CATH AND CORS/GRAFTS ANGIOGRAPHY N/A 07/25/2023  Procedure: LEFT HEART CATH AND CORS/GRAFTS ANGIOGRAPHY;  Surgeon: Iran Ouch, MD;  Location: [facility] INVASIVE CV LAB;  Service: Cardiovascular;  Laterality: N/A;   LEFT HEART CATHETERIZATION WITH CORONARY/GRAFT ANGIOGRAM N/A 07/05/2012   Procedure: LEFT HEART CATHETERIZATION WITH Isabel Caprice;  Surgeon: Wendall Stade, MD;  Location: [facility] CATH LAB;  Service: Cardiovascular;  Laterality: N/A;   MASS EXCISION N/A 03/19/2023   Procedure: BIOPSY OF MESENTERIC MASS;  Surgeon: Fritzi Mandes, MD;  Location: [facility] OR;  Service: General;  Laterality: N/A;   PACEMAKER IMPLANT N/A 03/17/2020   Procedure: PACEMAKER IMPLANT;  Surgeon: Duke Salvia, MD;  Location: [facility] INVASIVE CV LAB;  Service: Cardiovascular;  Laterality: N/A;   RIGHT/LEFT HEART CATH AND CORONARY/GRAFT ANGIOGRAPHY N/A 03/17/2020   Procedure: RIGHT/LEFT HEART CATH AND CORONARY/GRAFT ANGIOGRAPHY;  Surgeon: Iran Ouch, MD;  Location: [facility] INVASIVE CV LAB;  Service: Cardiovascular;  Laterality: N/A;   SHOULDER SURGERY  1998 / 2001   from accident   rotator cuff   TOTAL HIP ARTHROPLASTY Right 08/30/2022   Procedure: TOTAL HIP ARTHROPLASTY ANTERIOR APPROACH;  Surgeon: Ollen Gross, MD;  Location: [facility] ORS;  Service: Orthopedics;  Laterality: Right;   VESICOVAGINAL FISTULA CLOSURE W/ TAH  1998    Current Outpatient Medications  Medication Sig Dispense Refill   acetaminophen (TYLENOL) 650 MG CR tablet Take 650 mg by mouth every 8 (eight) hours as needed for pain.     amiodarone (PACERONE) 200 MG tablet Take 1 tablet by mouth 2 (two) times daily. (Patient not taking: Reported on 10/04/2023)     apixaban (ELIQUIS) 5 MG TABS tablet Take 1 tablet (5 mg total) by mouth 2 (two) times daily. 28 tablet    azithromycin (ZITHROMAX) 500 MG tablet TAKE 1 TABLET BY ORAL ROUTE DAILY FOR 3 DAYS. (Patient not taking: Reported on 10/04/2023)     cholecalciferol (VITAMIN D3) 25 MCG (1000 UNIT) tablet Take 1,000 Units by mouth daily.     cyanocobalamin (,VITAMIN B-12,) 1000 MCG/ML injection Inject 1,000 mcg into the muscle every 30 (thirty) days.     diltiazem (CARDIZEM) 120 MG tablet Take 120 mg by mouth daily.     empagliflozin (JARDIANCE) 10 MG TABS tablet Take 1 tablet (10 mg total) by mouth daily before breakfast. 14 tablet    Evolocumab (REPATHA SURECLICK) 140 MG/ML SOAJ Inject 140 mg into the skin every 14 (fourteen) days. 2 mL 11   ezetimibe (ZETIA) 10 MG tablet Take 1 tablet (10 mg total) by mouth daily. (Patient not taking: Reported on 10/04/2023) 90 tablet 1   famotidine (PEPCID) 20 MG tablet Take 20 mg by mouth daily as needed for heartburn or indigestion. (Patient not taking:  Reported on 10/04/2023)     furosemide (LASIX) 20 MG tablet Take 20 mg daily alternating with 40 mg every other day (Patient taking differently: Take 20-40 mg by mouth daily. Take 20 mg daily alternating with 40 mg every other day) 90 tablet 1   HYDROcodone-acetaminophen (NORCO/VICODIN) 5-325 MG tablet TAKE 1 TABLET BY MOUTH EVERY 6 (SIX) HOURS AS NEEDED FOR UP TO 3 DAYS FOR MODERATE PAIN.     levothyroxine (SYNTHROID) 75 MCG tablet TAKE 1 TABLET BY MOUTH EVERY DAY IN THE MORNING ON EMPTY STOMACH FOR 90 DAYS (Patient not taking: Reported on 10/04/2023)     levothyroxine (SYNTHROID) 88 MCG tablet Take 88 mcg by mouth daily before breakfast.     loratadine (CLARITIN) 10 MG tablet Take 10 mg by mouth daily.  metoprolol succinate (TOPROL-XL) 100 MG 24 hr tablet Take 1 tablet (100 mg total) by mouth daily. Take with or immediately following a meal. 90 tablet 3   metoprolol tartrate (LOPRESSOR) 50 MG tablet Take 1 tablet by mouth 2 (two) times daily. (Patient not taking: Reported on 10/04/2023)     Multiple Vitamin (MULTIVITAMIN WITH MINERALS) TABS tablet Take 1 tablet by mouth daily.     Multiple Vitamins-Minerals (PRESERVISION AREDS 2) CAPS Take 1 capsule by mouth 2 (two) times daily.     nitroGLYCERIN (NITROSTAT) 0.4 MG SL tablet PLACE 1 TABLET UNDER THE TONGUE EVERY 5 MINUTES AS NEEDED FOR CHEST PAIN 75 tablet 3   pantoprazole (PROTONIX) 40 MG tablet Take 40 mg by mouth daily.     Polyethylene Glycol 400 (BLINK TEARS OP) Place 1 drop into both eyes daily as needed (dry eyes).     sacubitril-valsartan (ENTRESTO) 49-51 MG Take 1 tablet by mouth 2 (two) times daily. 60 tablet 5   spironolactone (ALDACTONE) 25 MG tablet Take 0.5 tablets by mouth daily.     SUTAB [phone] MG TABS See admin instructions.     valACYclovir (VALTREX) 1000 MG tablet Take 1 tablet (1,000 mg total) by mouth 3 (three) times daily. 21 tablet 0   valsartan (DIOVAN) 80 MG tablet Take 1 tablet by mouth daily. (Patient not taking:  Reported on 10/04/2023)     No current facility-administered medications for this visit.    Allergies  Allergen Reactions   Ceclor [Cefaclor] Other (See Comments)    Lost vision in eye   Hctz [Hydrochlorothiazide] Other (See Comments)    Renal insufficiency   Lipitor [Atorvastatin Calcium] Other (See Comments)    Increased A1C   Norvasc [Amlodipine] Other (See Comments)    Makes patient stiff   Penicillins Hives, Itching, Rash, Shortness Of Breath and Other (See Comments)   Sulfa Antibiotics Other (See Comments)    CAUSES SHOCK   Milk-Related Compounds Diarrhea and Nausea And Vomiting   Statins Other (See Comments)    MYALGIAS AND WEAKNESS   Erythromycin Rash   Latex Other (See Comments)    Patient reports she had vomiting after shoulder surgery- was told it was possibly from latex. Denies having a latex rash.   Levofloxacin Rash   Tetracyclines & Related Rash    Social History   Socioeconomic History   Marital status: Widowed    Spouse name: Not on file   Number of children: 3   Years of education: Not on file   Highest education level: Not on file  Occupational History   Occupation: Retired- Engineer, site  Tobacco Use   Smoking status: Former    Current packs/day: 0.00    Types: Cigarettes    Start date: 07/13/2003    Quit date: 07/12/2010    Years since quitting: 13.3   Smokeless tobacco: Never   Tobacco comments:    Never a heavy smoker 2 a day  Vaping Use   Vaping status: Never Used  Substance and Sexual Activity   Alcohol use: No   Drug use: No   Sexual activity: Not Currently  Other Topics Concern   Not on file  Social History Narrative   Not on file   Social Drivers of Health   Financial Resource Strain: Not on file  Food Insecurity: No Food Insecurity (07/24/2023)   Hunger Vital Sign    Worried About Running Out of Food in the Last Year: Never true    Ran Out of Food  in the Last Year: Never true  Transportation Needs: No Transportation Needs  (07/24/2023)   PRAPARE - Administrator, Civil Service (Medical): No    Lack of Transportation (Non-Medical): No  Physical Activity: Not on file  Stress: Not on file  Social Connections: Unknown (01/19/2022)   Received from [facility]   Social Network    Social Network: Not on file  Intimate Partner Violence: Not At Risk (07/24/2023)   Humiliation, Afraid, Rape, and Kick questionnaire    Fear of Current or Ex-Partner: No    Emotionally Abused: No    Physically Abused: No    Sexually Abused: No    Family History  Problem Relation Age of Onset   Heart attack Mother    Heart disease Mother        had pacemaker   High blood pressure Mother    High Cholesterol Mother    Obesity Mother    Cancer Father    Heart attack Father    High blood pressure Father    High Cholesterol Father    Heart disease Father    Alcoholism Father    Obesity Father    Heart disease Sister    Lung cancer Sister        metastases to brain   Lung cancer Sister    Breast cancer Sister    Cancer Brother        pituitary cancer   Heart disease Brother    Hypertension Brother    Coronary artery disease Son    Thyroid disease Neg Hx     Review of Systems:  As stated in the HPI and otherwise negative.   There were no vitals taken for this visit.  Physical Examination: General: Well developed, well nourished, NAD  HEENT: OP clear, mucus membranes moist  SKIN: warm, dry. No rashes. Neuro: No focal deficits  Musculoskeletal: Muscle strength 5/5 all ext  Psychiatric: Mood and affect normal  Neck: No JVD, no carotid bruits, no thyromegaly, no lymphadenopathy.  Lungs:Clear bilaterally, no wheezes, rhonci, crackles Cardiovascular: Regular rate and rhythm. No murmurs, gallops or rubs. Abdomen:Soft. Bowel sounds present. Non-tender.  Extremities: No lower extremity edema. Pulses are 2 + in the bilateral DP/PT.  EKG is *** ordered today The ekg ordered today demonstrates    Recent Labs: 01/05/2023: Magnesium 2.1 06/18/2023: ALT 31 07/24/2023: B Natriuretic Peptide 270.5 07/26/2023: Hemoglobin 12.5; Platelets 193 08/20/2023: BUN 23; Creatinine, Ser 1.42; Potassium 4.4; Sodium 142   Lipid Panel    Component Value Date/Time   CHOL 191 07/25/2023 0450   CHOL 168 06/18/2023 1127   TRIG 61 07/25/2023 0450   HDL 61 07/25/2023 0450   HDL 78 06/18/2023 1127   CHOLHDL 3.1 07/25/2023 0450   VLDL 12 07/25/2023 0450   LDLCALC 118 (H) 07/25/2023 0450   LDLCALC 78 06/18/2023 1127     Wt Readings from Last 3 Encounters:  09/24/23 96.3 kg  08/03/23 96.3 kg  07/24/23 96.2 kg    Assessment and Plan:   1. CAD s/p CABG without angina: No chest pain suggestive of angina. Cardiac cath November 2024 with minimal disease in the LAD, patent LIMA to LAD. She is not on an ASA since she is on Eliquis. Continue Toprol.   2. HTN: BP is well controlled. No changes today  3. Hyperlipidemia: She is statin intolerant. She is now on Repatha. LDL 78 in October 2024. Continue Repatha.    4. Mitral regurgitation: Mild by echo  in December 2024  5. NICM/Chronic diastolic CHF: LV function now normal. Probable stress induced cardiomyopathy in November 2024. No volume overload on exam. Continue Toprol, Entreso and aldactone. Continue Lasix.   6. Aortic stenosis: Moderate by echo in December 2024. Repeat echo in one year.      7. HIgh grade AV block: permanent pacemaker in place and followed by Dr. Graciela Husbands  8. Atrial fibrillation, paroxysmal: Sinus today on amiodarone, Toprol and Cardizem. She is on Eliquis. No changes today  Labs/ tests ordered today include:  No orders of the defined types were placed in this encounter.  Disposition:   FU with me in 12 months  Signed, Verne Carrow, MD 11/07/2023 8:10 AM    [facility] [address] [place], [place], [place]  [address] Phone: [phone]; Fax: [phone]

## 2023-11-08 ENCOUNTER — Ambulatory Visit: Payer: Medicare HMO | Attending: Cardiovascular Disease | Admitting: Cardiovascular Disease

## 2023-11-08 VITALS — BP 118/62 | HR 76 | Ht 62.0 in | Wt 209.2 lb

## 2023-11-08 DIAGNOSIS — I2510 Atherosclerotic heart disease of native coronary artery without angina pectoris: Secondary | ICD-10-CM | POA: Diagnosis not present

## 2023-11-08 DIAGNOSIS — E785 Hyperlipidemia, unspecified: Secondary | ICD-10-CM

## 2023-11-08 DIAGNOSIS — I4819 Other persistent atrial fibrillation: Secondary | ICD-10-CM | POA: Diagnosis not present

## 2023-11-08 DIAGNOSIS — I350 Nonrheumatic aortic (valve) stenosis: Secondary | ICD-10-CM | POA: Diagnosis not present

## 2023-11-08 DIAGNOSIS — I10 Essential (primary) hypertension: Secondary | ICD-10-CM | POA: Diagnosis not present

## 2023-11-08 DIAGNOSIS — I5181 Takotsubo syndrome: Secondary | ICD-10-CM | POA: Diagnosis not present

## 2023-11-08 DIAGNOSIS — I340 Nonrheumatic mitral (valve) insufficiency: Secondary | ICD-10-CM

## 2023-11-08 MED ORDER — METOPROLOL SUCCINATE ER 100 MG PO TB24: 100.0000 mg | ORAL_TABLET | Freq: Every day | ORAL | 3 refills | Status: DC

## 2023-11-08 NOTE — Patient Instructions (Addendum)
 Medication Instructions:  No changes Toprol XL refilled today *If you need a refill on your cardiac medications before your next appointment, please call your pharmacy*   Lab Work: none If you have labs (blood work) drawn today and your tests are completely normal, you will receive your results only by: MyChart Message (if you have MyChart) OR A paper copy in the mail If you have any lab test that is abnormal or we need to change your treatment, we will call you to review the results.   Testing/Procedures: none   Follow-Up: At [facility], you and your health needs are our priority.  As part of our continuing mission to provide you with exceptional heart care, we have created designated Provider Care Teams.  These Care Teams include your primary Cardiologist (physician) and Advanced Practice Providers (APPs -  Physician Assistants and Nurse Practitioners) who all work together to provide you with the care you need, when you need it.   Your next appointment:   6 month(s)  Provider:   Verne Carrow, MD

## 2023-11-09 NOTE — Telephone Encounter (Signed)
 Spoke with pt and advised pt's tier exception has been denied.  Pt states she saw Dr Clifton James yesterday and discussed Warfarin vs Eliquis.  Also discussed option of Pradaxa through [facility] as well as the use of a [place] pharmacy.  Pt verbalizes understanding and states she will review her options and let our office know if she decides to make a change.  Pt thanked Charity fundraiser for the call.

## 2023-11-16 ENCOUNTER — Encounter: Payer: Self-pay | Admitting: Pharmacist

## 2023-11-22 DIAGNOSIS — E538 Deficiency of other specified B group vitamins: Secondary | ICD-10-CM | POA: Diagnosis not present

## 2023-12-06 DIAGNOSIS — G4733 Obstructive sleep apnea (adult) (pediatric): Secondary | ICD-10-CM | POA: Diagnosis not present

## 2023-12-11 ENCOUNTER — Other Ambulatory Visit (HOSPITAL_COMMUNITY): Payer: Self-pay | Admitting: Gastroenterology

## 2023-12-11 DIAGNOSIS — D3A8 Other benign neuroendocrine tumors: Secondary | ICD-10-CM

## 2023-12-11 DIAGNOSIS — K668 Other specified disorders of peritoneum: Secondary | ICD-10-CM

## 2023-12-14 ENCOUNTER — Ambulatory Visit (INDEPENDENT_AMBULATORY_CARE_PROVIDER_SITE_OTHER): Payer: Medicare HMO

## 2023-12-14 DIAGNOSIS — I441 Atrioventricular block, second degree: Secondary | ICD-10-CM | POA: Diagnosis not present

## 2023-12-14 LAB — CUP PACEART REMOTE DEVICE CHECK
Battery Remaining Longevity: 119 mo
Battery Voltage: 3 V
Brady Statistic AP VP Percent: 83.68 %
Brady Statistic AP VS Percent: 0.01 %
Brady Statistic AS VP Percent: 16.2 %
Brady Statistic AS VS Percent: 0.11 %
Brady Statistic RA Percent Paced: 83.7 %
Brady Statistic RV Percent Paced: 99.88 %
Date Time Interrogation Session: 20250404062450
Implantable Lead Connection Status: 753985
Implantable Lead Connection Status: 753985
Implantable Lead Implant Date: 20210707
Implantable Lead Implant Date: 20210707
Implantable Lead Location: 753859
Implantable Lead Location: 753860
Implantable Lead Model: 5076
Implantable Lead Model: 5076
Implantable Lead Serial Number: 8264443
Implantable Pulse Generator Implant Date: 20210707
Lead Channel Impedance Value: 342 Ohm
Lead Channel Impedance Value: 399 Ohm
Lead Channel Impedance Value: 437 Ohm
Lead Channel Impedance Value: 494 Ohm
Lead Channel Pacing Threshold Amplitude: 0.75 V
Lead Channel Pacing Threshold Amplitude: 0.875 V
Lead Channel Pacing Threshold Pulse Width: 0.4 ms
Lead Channel Pacing Threshold Pulse Width: 0.4 ms
Lead Channel Sensing Intrinsic Amplitude: 3 mV
Lead Channel Sensing Intrinsic Amplitude: 3 mV
Lead Channel Sensing Intrinsic Amplitude: 6.75 mV
Lead Channel Sensing Intrinsic Amplitude: 6.75 mV
Lead Channel Setting Pacing Amplitude: 1.5 V
Lead Channel Setting Pacing Amplitude: 2.5 V
Lead Channel Setting Pacing Pulse Width: 0.4 ms
Lead Channel Setting Sensing Sensitivity: 2.8 mV
Zone Setting Status: 755011
Zone Setting Status: 755011

## 2023-12-18 ENCOUNTER — Other Ambulatory Visit (HOSPITAL_COMMUNITY): Payer: Self-pay | Admitting: Gastroenterology

## 2023-12-18 DIAGNOSIS — K668 Other specified disorders of peritoneum: Secondary | ICD-10-CM

## 2023-12-18 DIAGNOSIS — R399 Unspecified symptoms and signs involving the genitourinary system: Secondary | ICD-10-CM | POA: Diagnosis not present

## 2023-12-18 DIAGNOSIS — D3A8 Other benign neuroendocrine tumors: Secondary | ICD-10-CM

## 2023-12-27 ENCOUNTER — Encounter (HOSPITAL_COMMUNITY): Payer: Self-pay

## 2023-12-27 ENCOUNTER — Ambulatory Visit (HOSPITAL_COMMUNITY)
Admission: RE | Admit: 2023-12-27 | Discharge: 2023-12-27 | Disposition: A | Discharge status: Home / Self Care | Source: Ambulatory Visit | Attending: Gastroenterology | Admitting: Gastroenterology

## 2023-12-27 DIAGNOSIS — Z9049 Acquired absence of other specified parts of digestive tract: Secondary | ICD-10-CM | POA: Diagnosis not present

## 2023-12-27 DIAGNOSIS — R935 Abnormal findings on diagnostic imaging of other abdominal regions, including retroperitoneum: Secondary | ICD-10-CM | POA: Diagnosis not present

## 2023-12-27 DIAGNOSIS — C7A8 Other malignant neuroendocrine tumors: Secondary | ICD-10-CM | POA: Diagnosis not present

## 2023-12-27 DIAGNOSIS — K668 Other specified disorders of peritoneum: Secondary | ICD-10-CM | POA: Insufficient documentation

## 2023-12-27 DIAGNOSIS — D3A8 Other benign neuroendocrine tumors: Secondary | ICD-10-CM | POA: Insufficient documentation

## 2023-12-27 DIAGNOSIS — R1900 Intra-abdominal and pelvic swelling, mass and lump, unspecified site: Secondary | ICD-10-CM | POA: Diagnosis not present

## 2023-12-27 MED ORDER — SODIUM CHLORIDE (PF) 0.9 % IJ SOLN
INTRAMUSCULAR | Status: AC
  Filled 2023-12-27: qty 50

## 2023-12-27 MED ORDER — IOHEXOL 300 MG/ML  SOLN
80.0000 mL | Freq: Once | INTRAMUSCULAR | Status: AC | PRN
  Administered 2023-12-27: 80 mL via INTRAVENOUS

## 2023-12-29 ENCOUNTER — Encounter: Payer: Self-pay | Admitting: Internal Medicine

## 2024-01-07 ENCOUNTER — Encounter: Payer: Self-pay | Admitting: Cardiovascular Disease

## 2024-01-17 DIAGNOSIS — E538 Deficiency of other specified B group vitamins: Secondary | ICD-10-CM | POA: Diagnosis not present

## 2024-01-21 NOTE — Progress Notes (Signed)
 Remote pacemaker transmission.

## 2024-01-30 DIAGNOSIS — D510 Vitamin B12 deficiency anemia due to intrinsic factor deficiency: Secondary | ICD-10-CM | POA: Diagnosis not present

## 2024-01-30 DIAGNOSIS — E039 Hypothyroidism, unspecified: Secondary | ICD-10-CM | POA: Diagnosis not present

## 2024-01-30 DIAGNOSIS — E559 Vitamin D deficiency, unspecified: Secondary | ICD-10-CM | POA: Diagnosis not present

## 2024-01-30 DIAGNOSIS — Z79899 Other long term (current) drug therapy: Secondary | ICD-10-CM | POA: Diagnosis not present

## 2024-01-30 DIAGNOSIS — E7800 Pure hypercholesterolemia, unspecified: Secondary | ICD-10-CM | POA: Diagnosis not present

## 2024-01-30 LAB — LAB REPORT - SCANNED
EGFR (Non-African Amer.): 40
TSH: 4.02 (ref 0.41–5.90)

## 2024-01-31 DIAGNOSIS — I480 Paroxysmal atrial fibrillation: Secondary | ICD-10-CM | POA: Diagnosis not present

## 2024-01-31 DIAGNOSIS — F331 Major depressive disorder, recurrent, moderate: Secondary | ICD-10-CM | POA: Diagnosis not present

## 2024-01-31 DIAGNOSIS — I5032 Chronic diastolic (congestive) heart failure: Secondary | ICD-10-CM | POA: Diagnosis not present

## 2024-01-31 DIAGNOSIS — E039 Hypothyroidism, unspecified: Secondary | ICD-10-CM | POA: Diagnosis not present

## 2024-01-31 DIAGNOSIS — N2581 Secondary hyperparathyroidism of renal origin: Secondary | ICD-10-CM | POA: Diagnosis not present

## 2024-01-31 DIAGNOSIS — D510 Vitamin B12 deficiency anemia due to intrinsic factor deficiency: Secondary | ICD-10-CM | POA: Diagnosis not present

## 2024-01-31 DIAGNOSIS — I700 Atherosclerosis of aorta: Secondary | ICD-10-CM | POA: Diagnosis not present

## 2024-01-31 DIAGNOSIS — I272 Pulmonary hypertension, unspecified: Secondary | ICD-10-CM | POA: Diagnosis not present

## 2024-01-31 DIAGNOSIS — I25119 Atherosclerotic heart disease of native coronary artery with unspecified angina pectoris: Secondary | ICD-10-CM | POA: Diagnosis not present

## 2024-01-31 DIAGNOSIS — I350 Nonrheumatic aortic (valve) stenosis: Secondary | ICD-10-CM | POA: Diagnosis not present

## 2024-01-31 DIAGNOSIS — Z0000 Encounter for general adult medical examination without abnormal findings: Secondary | ICD-10-CM | POA: Diagnosis not present

## 2024-02-05 DIAGNOSIS — Z950 Presence of cardiac pacemaker: Secondary | ICD-10-CM | POA: Diagnosis not present

## 2024-02-05 DIAGNOSIS — D3A098 Benign carcinoid tumors of other sites: Secondary | ICD-10-CM | POA: Diagnosis not present

## 2024-02-05 DIAGNOSIS — Z800 Family history of malignant neoplasm of digestive organs: Secondary | ICD-10-CM | POA: Diagnosis not present

## 2024-02-05 DIAGNOSIS — K219 Gastro-esophageal reflux disease without esophagitis: Secondary | ICD-10-CM | POA: Diagnosis not present

## 2024-02-05 DIAGNOSIS — Z862 Personal history of diseases of the blood and blood-forming organs and certain disorders involving the immune mechanism: Secondary | ICD-10-CM | POA: Diagnosis not present

## 2024-02-05 DIAGNOSIS — Z8679 Personal history of other diseases of the circulatory system: Secondary | ICD-10-CM | POA: Diagnosis not present

## 2024-02-05 DIAGNOSIS — K668 Other specified disorders of peritoneum: Secondary | ICD-10-CM | POA: Diagnosis not present

## 2024-02-15 ENCOUNTER — Encounter (INDEPENDENT_AMBULATORY_CARE_PROVIDER_SITE_OTHER): Payer: Medicare HMO | Admitting: Ophthalmology

## 2024-02-15 DIAGNOSIS — H353132 Nonexudative age-related macular degeneration, bilateral, intermediate dry stage: Secondary | ICD-10-CM | POA: Diagnosis not present

## 2024-02-15 DIAGNOSIS — I10 Essential (primary) hypertension: Secondary | ICD-10-CM

## 2024-02-15 DIAGNOSIS — H43813 Vitreous degeneration, bilateral: Secondary | ICD-10-CM

## 2024-02-15 DIAGNOSIS — H35033 Hypertensive retinopathy, bilateral: Secondary | ICD-10-CM

## 2024-02-19 DIAGNOSIS — E538 Deficiency of other specified B group vitamins: Secondary | ICD-10-CM | POA: Diagnosis not present

## 2024-02-29 DIAGNOSIS — M8588 Other specified disorders of bone density and structure, other site: Secondary | ICD-10-CM | POA: Diagnosis not present

## 2024-02-29 DIAGNOSIS — Z1231 Encounter for screening mammogram for malignant neoplasm of breast: Secondary | ICD-10-CM | POA: Diagnosis not present

## 2024-02-29 DIAGNOSIS — M069 Rheumatoid arthritis, unspecified: Secondary | ICD-10-CM | POA: Diagnosis not present

## 2024-02-29 DIAGNOSIS — R29890 Loss of height: Secondary | ICD-10-CM | POA: Diagnosis not present

## 2024-03-17 ENCOUNTER — Ambulatory Visit (INDEPENDENT_AMBULATORY_CARE_PROVIDER_SITE_OTHER): Payer: Medicare HMO

## 2024-03-17 DIAGNOSIS — I441 Atrioventricular block, second degree: Secondary | ICD-10-CM

## 2024-03-18 LAB — CUP PACEART REMOTE DEVICE CHECK
Battery Remaining Longevity: 112 mo
Battery Voltage: 2.99 V
Brady Statistic AP VP Percent: 80.29 %
Brady Statistic AP VS Percent: 0 %
Brady Statistic AS VP Percent: 19.71 %
Brady Statistic AS VS Percent: 0.01 %
Brady Statistic RA Percent Paced: 80.3 %
Brady Statistic RV Percent Paced: 99.99 %
Date Time Interrogation Session: 20250706235226
Implantable Lead Connection Status: 753985
Implantable Lead Connection Status: 753985
Implantable Lead Implant Date: 20210707
Implantable Lead Implant Date: 20210707
Implantable Lead Location: 753859
Implantable Lead Location: 753860
Implantable Lead Model: 5076
Implantable Lead Model: 5076
Implantable Lead Serial Number: 8264443
Implantable Pulse Generator Implant Date: 20210707
Lead Channel Impedance Value: 342 Ohm
Lead Channel Impedance Value: 361 Ohm
Lead Channel Impedance Value: 418 Ohm
Lead Channel Impedance Value: 475 Ohm
Lead Channel Pacing Threshold Amplitude: 0.75 V
Lead Channel Pacing Threshold Amplitude: 0.875 V
Lead Channel Pacing Threshold Pulse Width: 0.4 ms
Lead Channel Pacing Threshold Pulse Width: 0.4 ms
Lead Channel Sensing Intrinsic Amplitude: 3.125 mV
Lead Channel Sensing Intrinsic Amplitude: 3.125 mV
Lead Channel Sensing Intrinsic Amplitude: 6.75 mV
Lead Channel Sensing Intrinsic Amplitude: 6.75 mV
Lead Channel Setting Pacing Amplitude: 1.5 V
Lead Channel Setting Pacing Amplitude: 2.5 V
Lead Channel Setting Pacing Pulse Width: 0.4 ms
Lead Channel Setting Sensing Sensitivity: 2.8 mV
Zone Setting Status: 755011
Zone Setting Status: 755011

## 2024-03-20 DIAGNOSIS — E538 Deficiency of other specified B group vitamins: Secondary | ICD-10-CM | POA: Diagnosis not present

## 2024-03-27 DIAGNOSIS — G4733 Obstructive sleep apnea (adult) (pediatric): Secondary | ICD-10-CM | POA: Diagnosis not present

## 2024-04-12 ENCOUNTER — Ambulatory Visit: Payer: Self-pay | Admitting: Cardiology

## 2024-04-21 DIAGNOSIS — E538 Deficiency of other specified B group vitamins: Secondary | ICD-10-CM | POA: Diagnosis not present

## 2024-05-20 ENCOUNTER — Other Ambulatory Visit: Payer: Self-pay | Admitting: Cardiovascular Disease

## 2024-05-20 DIAGNOSIS — E785 Hyperlipidemia, unspecified: Secondary | ICD-10-CM

## 2024-05-22 ENCOUNTER — Other Ambulatory Visit: Payer: Self-pay

## 2024-05-22 ENCOUNTER — Encounter: Payer: Self-pay | Admitting: Internal Medicine

## 2024-05-22 DIAGNOSIS — E538 Deficiency of other specified B group vitamins: Secondary | ICD-10-CM | POA: Diagnosis not present

## 2024-05-22 MED ORDER — APIXABAN 5 MG PO TABS: 5.0000 mg | ORAL_TABLET | Freq: Two times a day (BID) | ORAL | 1 refills | Status: AC

## 2024-05-22 NOTE — Telephone Encounter (Signed)
 Prescription refill request for Eliquis  received. Indication:afib Last office visit:2/25 Scr:1.36  5/25 Age: 78 Weight:94.9  kg  Prescription refilled

## 2024-05-26 DIAGNOSIS — Z961 Presence of intraocular lens: Secondary | ICD-10-CM | POA: Diagnosis not present

## 2024-05-26 DIAGNOSIS — H16223 Keratoconjunctivitis sicca, not specified as Sjogren's, bilateral: Secondary | ICD-10-CM | POA: Diagnosis not present

## 2024-05-26 DIAGNOSIS — H353132 Nonexudative age-related macular degeneration, bilateral, intermediate dry stage: Secondary | ICD-10-CM | POA: Diagnosis not present

## 2024-06-05 DIAGNOSIS — I5032 Chronic diastolic (congestive) heart failure: Secondary | ICD-10-CM | POA: Diagnosis not present

## 2024-06-05 DIAGNOSIS — I480 Paroxysmal atrial fibrillation: Secondary | ICD-10-CM | POA: Diagnosis not present

## 2024-06-05 DIAGNOSIS — G4733 Obstructive sleep apnea (adult) (pediatric): Secondary | ICD-10-CM | POA: Diagnosis not present

## 2024-06-12 DIAGNOSIS — D510 Vitamin B12 deficiency anemia due to intrinsic factor deficiency: Secondary | ICD-10-CM | POA: Diagnosis not present

## 2024-06-12 DIAGNOSIS — Z23 Encounter for immunization: Secondary | ICD-10-CM | POA: Diagnosis not present

## 2024-06-12 NOTE — Progress Notes (Unsigned)
 No chief complaint on file.  History of Present Illness: 78 yo female with history of CAD s/p 1V CABG in 2010, HTN, HLD, GERD, mitral regurgitation, chronic diastolic CHF and high grade AV block s/p permanent pacemaker placement, persistent atrial fibrillation, stress induced cardiomyopathy in November 2024 who is here today for cardiac follow up. She was admitted 06/2012 with chest pain and ruled out for MI. Cardiac cath October 2013 with LAD 70-80%, mid RCA 30%, LIMA-LAD patent. She does not tolerate statins and has not tolerated HCTZ or Norvasc  in the past. She is on Repatha . Echo May 2020 with LVEF=60-65%. Mild aortic stenosis (mean gradient 16 mmHg). Mild mitral regurgitation. She has been diagnosed with hyperthyroidism and has undergone ablation. Admitted July 2021 with intermittent complete heart block. Cardiac cath with stable CAD, patent LIMA to LAD. Permanent pacemaker placed July 2021. Echo July 2021 with LVEF=60-65%, mild MR, mild AS. Normal ABI 2021. Atrial fib noted on device in May 2024. Eliquis  was started at that time. She was admitted to [facility] in November 2024 with chest pain. Troponin elevated to 973. Cardiac cath with mild disease in the LAD. Patent LIMA to LAD. Echo with LVEF=25-30% with apical hypokinesis. Mild MR. Mild to moderate AS. She was felt to have a stress induced cardiomyopathy. Repeat echo December 2024 with LVEF=60-65%. Mild MR. Moderate AS with mean gradient 20 mm Hg. AVA 1.3 cm2. SVI 48. DI 0.42.   She is here today for follow up. The patient denies any chest pain, dyspnea, palpitations, lower extremity edema, orthopnea, PND, dizziness, near syncope or syncope.    Primary Care Physician: Verena Mems, MD  Past Medical History:  Diagnosis Date   Anemia    pernicious   Anxiety    Arthritis    Asthma    Chronic diastolic CHF (congestive heart failure) (HCC)    a. 02/2011 Echo: EF 55-60%, Gr2 DD, Mild MR   Chronic kidney disease    stage 3   Complication of  anesthesia    Coronary artery disease    a. s/p CABG x 1 2010:  LIMA->LAD.;  b. amdx for CP => LHC 07/04/12: LAD 70-80%, mid RCA 30%, LIMA-LAD patent with competitive flow limiting distal LAD filling, EF 70% with hyperdynamic LV function. Medical therapy continued.   Depression    Dyslipidemia    GERD (gastroesophageal reflux disease)    Headache    hx of migraines   Heart murmur    History of hiatal hernia    History of kidney stones    Hyperlipidemia    Hypertension    Hyperthyroidism    Hypothyroidism    Mild aortic stenosis    Mitral regurgitation    a. mild by echo 02/2011.   PAF (paroxysmal atrial fibrillation) (HCC)    Pneumonia    PONV (postoperative nausea and vomiting)    Pre-diabetes    Presence of permanent cardiac pacemaker    Sleep apnea    diagnosed 08-2022    Past Surgical History:  Procedure Laterality Date   ABDOMINAL HYSTERECTOMY  1980   CARDIAC CATHETERIZATION  07/27/09 & 07/28/09   CESAREAN SECTION     x 2   CORONARY ARTERY BYPASS GRAFT  08/03/2009   x1 using left internal mammary artery to distal left anterior  descending coronary artery.    GALLBLADDER SURGERY  2001   LAPAROSCOPY N/A 03/19/2023   Procedure: LAPAROSCOPY DIAGNOSTIC;  Surgeon: Dasie Leonor CROME, MD;  Location: [facility] OR;  Service: General;  Laterality:  N/A;   LEFT HEART CATH AND CORS/GRAFTS ANGIOGRAPHY N/A 07/25/2023   Procedure: LEFT HEART CATH AND CORS/GRAFTS ANGIOGRAPHY;  Surgeon: Darron Deatrice LABOR, MD;  Location: [facility] INVASIVE CV LAB;  Service: Cardiovascular;  Laterality: N/A;   LEFT HEART CATHETERIZATION WITH CORONARY/GRAFT ANGIOGRAM N/A 07/05/2012   Procedure: LEFT HEART CATHETERIZATION WITH EL BILE;  Surgeon: Maude JAYSON Emmer, MD;  Location: [facility] CATH LAB;  Service: Cardiovascular;  Laterality: N/A;   MASS EXCISION N/A 03/19/2023   Procedure: BIOPSY OF MESENTERIC MASS;  Surgeon: Dasie Leonor CROME, MD;  Location: [facility] OR;  Service: General;  Laterality: N/A;   PACEMAKER IMPLANT N/A  03/17/2020   Procedure: PACEMAKER IMPLANT;  Surgeon: Fernande Elspeth JAYSON, MD;  Location: [facility] INVASIVE CV LAB;  Service: Cardiovascular;  Laterality: N/A;   RIGHT/LEFT HEART CATH AND CORONARY/GRAFT ANGIOGRAPHY N/A 03/17/2020   Procedure: RIGHT/LEFT HEART CATH AND CORONARY/GRAFT ANGIOGRAPHY;  Surgeon: Darron Deatrice LABOR, MD;  Location: [facility] INVASIVE CV LAB;  Service: Cardiovascular;  Laterality: N/A;   SHOULDER SURGERY  1998 / 2001   from accident   rotator cuff   TOTAL HIP ARTHROPLASTY Right 08/30/2022   Procedure: TOTAL HIP ARTHROPLASTY ANTERIOR APPROACH;  Surgeon: Melodi Lerner, MD;  Location: [facility] ORS;  Service: Orthopedics;  Laterality: Right;   VESICOVAGINAL FISTULA CLOSURE W/ TAH  1998    Current Outpatient Medications  Medication Sig Dispense Refill   acetaminophen  (TYLENOL ) 650 MG CR tablet Take 650 mg by mouth every 8 (eight) hours as needed for pain.     apixaban  (ELIQUIS ) 5 MG TABS tablet Take 1 tablet (5 mg total) by mouth 2 (two) times daily. 180 tablet 1   azithromycin (ZITHROMAX) 500 MG tablet      cholecalciferol  (VITAMIN D3) 25 MCG (1000 UNIT) tablet Take 1,000 Units by mouth daily.     cyanocobalamin  (,VITAMIN B-12,) 1000 MCG/ML injection Inject 1,000 mcg into the muscle every 30 (thirty) days.     diltiazem  (CARDIZEM ) 120 MG tablet Take 120 mg by mouth daily.     empagliflozin  (JARDIANCE ) 10 MG TABS tablet Take 1 tablet (10 mg total) by mouth daily before breakfast. 14 tablet    Evolocumab  (REPATHA  SURECLICK) 140 MG/ML SOAJ INJECT 140 MG INTO THE SKIN EVERY 14 (FOURTEEN) DAYS. 6 mL 1   famotidine (PEPCID) 20 MG tablet Take 20 mg by mouth daily as needed for heartburn or indigestion.     furosemide  (LASIX ) 20 MG tablet Take 20 mg daily alternating with 40 mg every other day (Patient taking differently: Take 20-40 mg by mouth daily. Take 20 mg daily alternating with 40 mg every other day) 90 tablet 1   levothyroxine  (SYNTHROID ) 88 MCG tablet Take 88 mcg by mouth daily before breakfast.      loratadine  (CLARITIN ) 10 MG tablet Take 10 mg by mouth daily.     metoprolol  succinate (TOPROL -XL) 100 MG 24 hr tablet Take 1 tablet (100 mg total) by mouth daily. Take with or immediately following a meal. 90 tablet 3   Multiple Vitamin (MULTIVITAMIN WITH MINERALS) TABS tablet Take 1 tablet by mouth daily.     Multiple Vitamins-Minerals (PRESERVISION AREDS 2) CAPS Take 1 capsule by mouth 2 (two) times daily.     nitroGLYCERIN  (NITROSTAT ) 0.4 MG SL tablet PLACE 1 TABLET UNDER THE TONGUE EVERY 5 MINUTES AS NEEDED FOR CHEST PAIN 75 tablet 3   pantoprazole  (PROTONIX ) 40 MG tablet Take 40 mg by mouth daily.     Polyethylene Glycol 400 (BLINK TEARS OP) Place 1 drop into  both eyes daily as needed (dry eyes).     sacubitril-valsartan  (ENTRESTO ) 49-51 MG Take 1 tablet by mouth 2 (two) times daily. 60 tablet 5   spironolactone  (ALDACTONE ) 25 MG tablet Take 0.5 tablets by mouth daily.     valACYclovir  (VALTREX ) 1000 MG tablet Take 1 tablet (1,000 mg total) by mouth 3 (three) times daily. 21 tablet 0   valsartan  (DIOVAN ) 80 MG tablet Take 1 tablet by mouth daily. (Patient not taking: Reported on 10/04/2023)     No current facility-administered medications for this visit.    Allergies  Allergen Reactions   Ceclor [Cefaclor] Other (See Comments)    Lost vision in eye   Hctz [Hydrochlorothiazide] Other (See Comments)    Renal insufficiency   Lipitor [Atorvastatin  Calcium ] Other (See Comments)    Increased A1C   Norvasc  [Amlodipine ] Other (See Comments)    Makes patient stiff   Penicillins Hives, Itching, Rash, Shortness Of Breath and Other (See Comments)   Sulfa  Antibiotics Other (See Comments)    CAUSES SHOCK   Milk-Related Compounds Diarrhea and Nausea And Vomiting   Statins Other (See Comments)    MYALGIAS AND WEAKNESS   Erythromycin Rash   Latex Other (See Comments)    Patient reports she had vomiting after shoulder surgery- was told it was possibly from latex. Denies having a latex rash.    Levofloxacin  Rash   Tetracyclines & Related Rash    Social History   Socioeconomic History   Marital status: Widowed    Spouse name: Not on file   Number of children: 3   Years of education: Not on file   Highest education level: Not on file  Occupational History   Occupation: Retired- Engineer, site  Tobacco Use   Smoking status: Former    Current packs/day: 0.00    Types: Cigarettes    Start date: 07/13/2003    Quit date: 07/12/2010    Years since quitting: 13.9   Smokeless tobacco: Never   Tobacco comments:    Never a heavy smoker 2 a day  Vaping Use   Vaping status: Never Used  Substance and Sexual Activity   Alcohol use: No   Drug use: No   Sexual activity: Not Currently  Other Topics Concern   Not on file  Social History Narrative   Not on file   Social Drivers of Health   Financial Resource Strain: Not on file  Food Insecurity: No Food Insecurity (07/24/2023)   Hunger Vital Sign    Worried About Running Out of Food in the Last Year: Never true    Ran Out of Food in the Last Year: Never true  Transportation Needs: No Transportation Needs (07/24/2023)   PRAPARE - Administrator, Civil Service (Medical): No    Lack of Transportation (Non-Medical): No  Physical Activity: Not on file  Stress: Not on file  Social Connections: Unknown (01/19/2022)   Received from [facility]   Social Network    Social Network: Not on file  Intimate Partner Violence: Not At Risk (07/24/2023)   Humiliation, Afraid, Rape, and Kick questionnaire    Fear of Current or Ex-Partner: No    Emotionally Abused: No    Physically Abused: No    Sexually Abused: No    Family History  Problem Relation Age of Onset   Heart attack Mother    Heart disease Mother        had pacemaker   High blood pressure Mother  High Cholesterol Mother    Obesity Mother    Cancer Father    Heart attack Father    High blood pressure Father    High Cholesterol Father    Heart disease  Father    Alcoholism Father    Obesity Father    Heart disease Sister    Lung cancer Sister        metastases to brain   Lung cancer Sister    Breast cancer Sister    Cancer Brother        pituitary cancer   Heart disease Brother    Hypertension Brother    Coronary artery disease Son    Thyroid  disease Neg Hx     Review of Systems:  As stated in the HPI and otherwise negative.   There were no vitals taken for this visit.  Physical Examination: General: Well developed, well nourished, NAD  HEENT: OP clear, mucus membranes moist  SKIN: warm, dry. No rashes. Neuro: No focal deficits  Musculoskeletal: Muscle strength 5/5 all ext  Psychiatric: Mood and affect normal  Neck: No JVD, no carotid bruits, no thyromegaly, no lymphadenopathy.  Lungs:Clear bilaterally, no wheezes, rhonci, crackles Cardiovascular: Regular rate and rhythm. No murmurs, gallops or rubs. Abdomen:Soft. Bowel sounds present. Non-tender.  Extremities: No lower extremity edema. Pulses are 2 + in the bilateral DP/PT.  EKG is *** ordered today The ekg ordered today demonstrates ***  Recent Labs: 06/18/2023: ALT 31 07/24/2023: B Natriuretic Peptide 270.5 07/26/2023: Hemoglobin 12.5; Platelets 193 08/20/2023: BUN 23; Creatinine, Ser 1.42; Potassium 4.4; Sodium 142 01/30/2024: TSH 4.02   Lipid Panel    Component Value Date/Time   CHOL 191 07/25/2023 0450   CHOL 168 06/18/2023 1127   TRIG 61 07/25/2023 0450   HDL 61 07/25/2023 0450   HDL 78 06/18/2023 1127   CHOLHDL 3.1 07/25/2023 0450   VLDL 12 07/25/2023 0450   LDLCALC 118 (H) 07/25/2023 0450   LDLCALC 78 06/18/2023 1127     Wt Readings from Last 3 Encounters:  11/08/23 209 lb 3.2 oz (94.9 kg)  09/24/23 212 lb 6.4 oz (96.3 kg)  08/03/23 212 lb 6.4 oz (96.3 kg)    Assessment and Plan:   1. CAD s/p CABG without angina: No chest pain. Cardiac cath November 2024 with minimal disease in the LAD, patent LIMA to LAD. She is not on an ASA since she is on  Eliquis . Continue Toprol   2. HTN: BP is controlled. Continue current therapy  3. Hyperlipidemia: She is statin intolerant. She is now on Repatha . LDL ***. Continue Repatha   4. Mitral regurgitation: Mild by echo in December 2024.   5. NICM/Chronic diastolic CHF: LV function now normal. Probable stress induced cardiomyopathy in November 2024. Wt is stable. Aldactone  was stopped secondary to hyperkalemia. Continue Toprol , Entresto  and Lasix .   6. Aortic stenosis: Moderate by echo in December 2024. Repeat echo in December 2025.   7. HIgh grade AV block: permanent pacemaker in place  8. Atrial fibrillation, paroxysmal: Sinus today. Continue Toprol , Cardizem  and Eliquis   Labs/ tests ordered today include:  No orders of the defined types were placed in this encounter.  Disposition:   F/U with me in 12 months  Signed, Lonni Cash, MD 06/12/2024 2:04 PM    [facility] [address] [place], [place], [place]  [address] Phone: [phone]; Fax: [phone]

## 2024-06-13 ENCOUNTER — Encounter: Payer: Self-pay | Admitting: Cardiovascular Disease

## 2024-06-13 ENCOUNTER — Ambulatory Visit: Attending: Cardiovascular Disease | Admitting: Cardiovascular Disease

## 2024-06-13 VITALS — BP 138/82 | HR 76 | Resp 16 | Ht 62.0 in | Wt 212.8 lb

## 2024-06-13 DIAGNOSIS — J329 Chronic sinusitis, unspecified: Secondary | ICD-10-CM | POA: Diagnosis not present

## 2024-06-13 DIAGNOSIS — I480 Paroxysmal atrial fibrillation: Secondary | ICD-10-CM

## 2024-06-13 DIAGNOSIS — E785 Hyperlipidemia, unspecified: Secondary | ICD-10-CM

## 2024-06-13 DIAGNOSIS — I10 Essential (primary) hypertension: Secondary | ICD-10-CM | POA: Diagnosis not present

## 2024-06-13 DIAGNOSIS — I340 Nonrheumatic mitral (valve) insufficiency: Secondary | ICD-10-CM | POA: Diagnosis not present

## 2024-06-13 DIAGNOSIS — H6993 Unspecified Eustachian tube disorder, bilateral: Secondary | ICD-10-CM | POA: Diagnosis not present

## 2024-06-13 DIAGNOSIS — I2510 Atherosclerotic heart disease of native coronary artery without angina pectoris: Secondary | ICD-10-CM | POA: Diagnosis not present

## 2024-06-13 DIAGNOSIS — H6121 Impacted cerumen, right ear: Secondary | ICD-10-CM | POA: Diagnosis not present

## 2024-06-13 DIAGNOSIS — I350 Nonrheumatic aortic (valve) stenosis: Secondary | ICD-10-CM | POA: Diagnosis not present

## 2024-06-13 DIAGNOSIS — H8113 Benign paroxysmal vertigo, bilateral: Secondary | ICD-10-CM | POA: Diagnosis not present

## 2024-06-13 DIAGNOSIS — I441 Atrioventricular block, second degree: Secondary | ICD-10-CM | POA: Diagnosis not present

## 2024-06-13 MED ORDER — FUROSEMIDE 40 MG PO TABS: 40.0000 mg | ORAL_TABLET | ORAL | 3 refills | Status: DC

## 2024-06-13 NOTE — Patient Instructions (Signed)
 Medication Instructions:  Your physician has recommended you make the following change in your medication:  1) CHANGE Lasix  (furosemide ) to 40 mg every other day  *If you need a refill on your cardiac medications before your next appointment, please call your pharmacy*  Testing/Procedures: Echocardiogram  Your physician has requested that you have an echocardiogram. Echocardiography is a painless test that uses sound waves to create images of your heart. It provides your doctor with information about the size and shape of your heart and how well your heart's chambers and valves are working. This procedure takes approximately one hour. There are no restrictions for this procedure. Please do NOT wear cologne, perfume, aftershave, or lotions (deodorant is allowed). Please arrive 15 minutes prior to your appointment time.  Please note: We ask at that you not bring children with you during ultrasound (echo/ vascular) testing. Due to room size and safety concerns, children are not allowed in the ultrasound rooms during exams. Our front office staff cannot provide observation of children in our lobby area while testing is being conducted. An adult accompanying a patient to their appointment will only be allowed in the ultrasound room at the discretion of the ultrasound technician under special circumstances. We apologize for any inconvenience.  Follow-Up: At [facility], you and your health needs are our priority.  As part of our continuing mission to provide you with exceptional heart care, our providers are all part of one team.  This team includes your primary Cardiologist (physician) and Advanced Practice Providers or APPs (Physician Assistants and Nurse Practitioners) who all work together to provide you with the care you need, when you need it.  Your next appointment:   3-4 months  Provider:   Lonni Cash, MD

## 2024-06-16 ENCOUNTER — Encounter: Payer: Self-pay | Admitting: Internal Medicine

## 2024-06-16 ENCOUNTER — Ambulatory Visit (INDEPENDENT_AMBULATORY_CARE_PROVIDER_SITE_OTHER): Payer: Medicare HMO

## 2024-06-16 DIAGNOSIS — I4819 Other persistent atrial fibrillation: Secondary | ICD-10-CM | POA: Diagnosis not present

## 2024-06-18 LAB — CUP PACEART REMOTE DEVICE CHECK
Battery Remaining Longevity: 109 mo
Battery Voltage: 2.99 V
Brady Statistic AP VP Percent: 82.33 %
Brady Statistic AP VS Percent: 0 %
Brady Statistic AS VP Percent: 17.66 %
Brady Statistic AS VS Percent: 0.01 %
Brady Statistic RA Percent Paced: 82.34 %
Brady Statistic RV Percent Paced: 99.99 %
Date Time Interrogation Session: 20251005222417
Implantable Lead Connection Status: 753985
Implantable Lead Connection Status: 753985
Implantable Lead Implant Date: 20210707
Implantable Lead Implant Date: 20210707
Implantable Lead Location: 753859
Implantable Lead Location: 753860
Implantable Lead Model: 5076
Implantable Lead Model: 5076
Implantable Lead Serial Number: 8264443
Implantable Pulse Generator Implant Date: 20210707
Lead Channel Impedance Value: 380 Ohm
Lead Channel Impedance Value: 456 Ohm
Lead Channel Impedance Value: 494 Ohm
Lead Channel Impedance Value: 513 Ohm
Lead Channel Pacing Threshold Amplitude: 0.75 V
Lead Channel Pacing Threshold Amplitude: 0.875 V
Lead Channel Pacing Threshold Pulse Width: 0.4 ms
Lead Channel Pacing Threshold Pulse Width: 0.4 ms
Lead Channel Sensing Intrinsic Amplitude: 2.75 mV
Lead Channel Sensing Intrinsic Amplitude: 2.75 mV
Lead Channel Sensing Intrinsic Amplitude: 6.75 mV
Lead Channel Sensing Intrinsic Amplitude: 6.75 mV
Lead Channel Setting Pacing Amplitude: 1.5 V
Lead Channel Setting Pacing Amplitude: 2.5 V
Lead Channel Setting Pacing Pulse Width: 0.4 ms
Lead Channel Setting Sensing Sensitivity: 2.8 mV
Zone Setting Status: 755011
Zone Setting Status: 755011

## 2024-06-19 NOTE — Progress Notes (Signed)
 Remote PPM Transmission

## 2024-06-20 ENCOUNTER — Ambulatory Visit: Payer: Self-pay | Admitting: Cardiology

## 2024-06-23 NOTE — Progress Notes (Signed)
 Remote PPM Transmission

## 2024-07-03 DIAGNOSIS — E538 Deficiency of other specified B group vitamins: Secondary | ICD-10-CM | POA: Diagnosis not present

## 2024-07-05 ENCOUNTER — Other Ambulatory Visit: Payer: Self-pay | Admitting: Cardiovascular Disease

## 2024-07-08 MED ORDER — DILTIAZEM HCL ER COATED BEADS 120 MG PO CP24: 120.0000 mg | ORAL_CAPSULE | Freq: Every day | ORAL | 3 refills | Status: AC

## 2024-07-14 ENCOUNTER — Telehealth: Payer: Self-pay | Admitting: Pharmacy Technician

## 2024-07-14 NOTE — Telephone Encounter (Signed)
 PAP: Patient assistance application for Jardiance  through [employer]) has been mailed to pt's home address on file. Provider portion of application will be faxed to provider's office.  Uploading blank provider form to media  Renewal for assistance- ends 09/10/24

## 2024-07-28 ENCOUNTER — Other Ambulatory Visit (HOSPITAL_COMMUNITY): Payer: Self-pay

## 2024-07-28 DIAGNOSIS — N183 Chronic kidney disease, stage 3 unspecified: Secondary | ICD-10-CM | POA: Diagnosis not present

## 2024-07-28 DIAGNOSIS — N2581 Secondary hyperparathyroidism of renal origin: Secondary | ICD-10-CM | POA: Diagnosis not present

## 2024-07-28 DIAGNOSIS — K219 Gastro-esophageal reflux disease without esophagitis: Secondary | ICD-10-CM | POA: Diagnosis not present

## 2024-07-28 DIAGNOSIS — N2589 Other disorders resulting from impaired renal tubular function: Secondary | ICD-10-CM | POA: Diagnosis not present

## 2024-07-28 DIAGNOSIS — N189 Chronic kidney disease, unspecified: Secondary | ICD-10-CM | POA: Diagnosis not present

## 2024-07-28 LAB — LAB REPORT - SCANNED: EGFR: 34

## 2024-07-28 NOTE — Telephone Encounter (Signed)
 Patient Advocate Encounter- went with a grant    The patient was approved for a [employer] grant that will help cover the cost of Jardiance   Total amount awarded, 7500.00.  Effective: 06/28/24 - 06/27/25   APW:[id number] ERW:[id number] Hmnle:[id number] PI:[id number]  [employer] ID: [id number]   Pharmacy provided with approval and processing information. Patient informed via mychart

## 2024-07-28 NOTE — Telephone Encounter (Signed)
 Patient sent a note with her med assistance application that says the following:    I wanted to clarify a message to Dr. Barbette in April. After his statement there was no other meds to replace the Jardiance . I have continued to take the Jardiance  and have decreased the furosemide  dosage. This decrease has helped Placed this note inside of Dr's mailbox for reference

## 2024-07-29 ENCOUNTER — Ambulatory Visit (HOSPITAL_COMMUNITY)
Admission: RE | Admit: 2024-07-29 | Discharge: 2024-07-29 | Disposition: A | Discharge status: Home / Self Care | Source: Ambulatory Visit | Attending: Cardiology | Admitting: Cardiology

## 2024-07-29 ENCOUNTER — Ambulatory Visit: Payer: Self-pay | Admitting: Cardiovascular Disease

## 2024-07-29 DIAGNOSIS — I10 Essential (primary) hypertension: Secondary | ICD-10-CM | POA: Diagnosis not present

## 2024-07-29 DIAGNOSIS — E785 Hyperlipidemia, unspecified: Secondary | ICD-10-CM | POA: Diagnosis not present

## 2024-07-29 DIAGNOSIS — I2510 Atherosclerotic heart disease of native coronary artery without angina pectoris: Secondary | ICD-10-CM | POA: Diagnosis not present

## 2024-07-29 LAB — ECHOCARDIOGRAM COMPLETE
AR max vel: 1.2 cm2
AV Area VTI: 1.15 cm2
AV Area mean vel: 1.11 cm2
AV Mean grad: 32 mmHg
AV Peak grad: 56.9 mmHg
Ao pk vel: 3.77 m/s
Area-P 1/2: 4.64 cm2
S' Lateral: 1.87 cm

## 2024-07-30 ENCOUNTER — Other Ambulatory Visit: Payer: Self-pay

## 2024-07-31 ENCOUNTER — Encounter: Payer: Self-pay | Admitting: Oncology

## 2024-07-31 DIAGNOSIS — E538 Deficiency of other specified B group vitamins: Secondary | ICD-10-CM | POA: Diagnosis not present

## 2024-07-31 MED ORDER — FUROSEMIDE 40 MG PO TABS
ORAL_TABLET | ORAL | 3 refills | Status: AC
Start: 2024-07-31 — End: ?

## 2024-07-31 NOTE — Telephone Encounter (Signed)
 I spoke with patient.  She it taking furosemide  20 mg alternating with 40 mg every other day.  Reviewed with Dr Verlin and OK to continue this dose.  Med list updated.

## 2024-07-31 NOTE — Addendum Note (Signed)
 Addended by: Dhrithi Riche L on: 07/31/2024 04:30 PM   Modules accepted: Orders

## 2024-08-01 ENCOUNTER — Encounter: Payer: Self-pay | Admitting: Cardiovascular Disease

## 2024-08-01 NOTE — Telephone Encounter (Signed)
 Patient called in, wanting an update hopefully before end of day as they leave for [facility] tomorrow 11/22. Informed patient that delay may be due to Dr. Verlin out of office.

## 2024-08-04 ENCOUNTER — Telehealth: Payer: Self-pay | Admitting: Pharmacy Technician

## 2024-08-04 ENCOUNTER — Encounter: Payer: Self-pay | Admitting: Cardiovascular Disease

## 2024-08-04 ENCOUNTER — Other Ambulatory Visit (HOSPITAL_COMMUNITY): Payer: Self-pay

## 2024-08-04 MED ORDER — METOPROLOL SUCCINATE ER 100 MG PO TB24: 50.0000 mg | ORAL_TABLET | Freq: Every day | ORAL | Status: AC

## 2024-08-04 MED ORDER — SACUBITRIL-VALSARTAN 49-51 MG PO TABS: 1.0000 | ORAL_TABLET | Freq: Two times a day (BID) | ORAL | 2 refills | Status: DC

## 2024-08-04 NOTE — Telephone Encounter (Signed)
    Given to [facility] Phone: [phone] Fax: [phone]  This can be used for her jardiance /eliquis /entresto

## 2024-08-21 DIAGNOSIS — I10 Essential (primary) hypertension: Secondary | ICD-10-CM | POA: Diagnosis not present

## 2024-08-21 DIAGNOSIS — M810 Age-related osteoporosis without current pathological fracture: Secondary | ICD-10-CM | POA: Diagnosis not present

## 2024-08-21 DIAGNOSIS — D510 Vitamin B12 deficiency anemia due to intrinsic factor deficiency: Secondary | ICD-10-CM | POA: Diagnosis not present

## 2024-08-28 DIAGNOSIS — G4733 Obstructive sleep apnea (adult) (pediatric): Secondary | ICD-10-CM | POA: Diagnosis not present

## 2024-09-15 ENCOUNTER — Ambulatory Visit: Payer: Medicare HMO

## 2024-09-17 ENCOUNTER — Ambulatory Visit: Payer: Self-pay | Admitting: Cardiology

## 2024-09-17 LAB — CUP PACEART REMOTE DEVICE CHECK
Battery Remaining Longevity: 101 mo
Battery Voltage: 2.99 V
Brady Statistic AP VP Percent: 65.79 %
Brady Statistic AP VS Percent: 0 %
Brady Statistic AS VP Percent: 34.2 %
Brady Statistic AS VS Percent: 0.01 %
Brady Statistic RA Percent Paced: 65.81 %
Brady Statistic RV Percent Paced: 99.99 %
Date Time Interrogation Session: 20260105041254
Implantable Lead Connection Status: 753985
Implantable Lead Connection Status: 753985
Implantable Lead Implant Date: 20210707
Implantable Lead Implant Date: 20210707
Implantable Lead Location: 753859
Implantable Lead Location: 753860
Implantable Lead Model: 5076
Implantable Lead Model: 5076
Implantable Lead Serial Number: 8264443
Implantable Pulse Generator Implant Date: 20210707
Lead Channel Impedance Value: 323 Ohm
Lead Channel Impedance Value: 361 Ohm
Lead Channel Impedance Value: 418 Ohm
Lead Channel Impedance Value: 456 Ohm
Lead Channel Pacing Threshold Amplitude: 0.75 V
Lead Channel Pacing Threshold Amplitude: 0.875 V
Lead Channel Pacing Threshold Pulse Width: 0.4 ms
Lead Channel Pacing Threshold Pulse Width: 0.4 ms
Lead Channel Sensing Intrinsic Amplitude: 2.625 mV
Lead Channel Sensing Intrinsic Amplitude: 2.625 mV
Lead Channel Sensing Intrinsic Amplitude: 6.75 mV
Lead Channel Sensing Intrinsic Amplitude: 6.75 mV
Lead Channel Setting Pacing Amplitude: 1.5 V
Lead Channel Setting Pacing Amplitude: 2.5 V
Lead Channel Setting Pacing Pulse Width: 0.4 ms
Lead Channel Setting Sensing Sensitivity: 2.8 mV
Zone Setting Status: 755011
Zone Setting Status: 755011

## 2024-09-18 ENCOUNTER — Encounter: Payer: Self-pay | Admitting: Cardiovascular Disease

## 2024-09-23 NOTE — Progress Notes (Unsigned)
 " Electrophysiology Office Note:   Date:  09/24/2024  ID:  Samantha Short, DOB 1946/08/25, MRN [id number]  Primary Cardiologist: Lonni Cash, MD Primary Heart Failure: None Electrophysiologist: Bettylou Frew Gladis Norton, MD      History of Present Illness:   Samantha Short is a 79 y.o. female with h/o coronary artery disease post one-vessel CABG in 2010, hypertension, hyperlipidemia, mitral regurgitation, diastolic heart failure, high-grade AV block, persistent atrial fibrillation seen today for routine electrophysiology followup.   Discussed the use of AI scribe software for clinical note transcription with the patient, who gave verbal consent to proceed.  History of Present Illness Samantha Short is a 79 year old female with stage three kidney disease and a pacemaker who presents with shortness of breath and chest discomfort.  She has experienced persistent shortness of breath since October, which remains unchanged. She experiences significant dyspnea with minimal exertion, such as walking from her car to the clinic, necessitating frequent stops. Her symptoms have also prevented her from swimming, an activity she has enjoyed since childhood.  She describes chest discomfort that worsens with stress and physical activity and is not alleviated by nitroglycerin . The discomfort can occur even at rest, such as when sitting on the couch, and is severe enough that her daughter notices her heavy breathing. She has been using indigestion pills instead of nitroglycerin , as the discomfort subsides on its own.  She was previously diagnosed with osteoporosis and was started on a generic form of Fosamax, which she has since discontinued due to adverse effects that impaired her ability to function. Since stopping the medication, some symptoms have improved, but the shortness of breath and chest discomfort persist.  She has a history of stage three kidney disease, which has shown recent changes in  function. Her kidney doctor suggested reducing her heart medications, leading to a reduction in her metoprolol  dosage.  She has a pacemaker, placed by Dr. Fernande, and reports no issues with its function. She also mentions having a heart catheterization in November 2024.  She has been evaluated by a sleep doctor and reports that her BiBAP scores have increased slightly, with two scores of seven in the past two weeks, but she has been released for a year as long as scores remain below five.  she denies chest pain, palpitations, dyspnea, PND, orthopnea, nausea, vomiting, dizziness, syncope, edema, weight gain, or early satiety.   Review of systems complete and found to be negative unless listed in HPI.      EP Information / Studies Reviewed:    EKG is ordered today. Personal review as below.  EKG Interpretation Date/Time:  Wednesday September 24 2024 11:39:07 EST Ventricular Rate:  81 PR Interval:  178 QRS Duration:  156 QT Interval:  430 QTC Calculation: 499 R Axis:   -83  Text Interpretation: AV dual-paced rhythm When compared with ECG of 04-Oct-2023 14:15, Vent. rate has increased BY   6 BPM Confirmed by Analiyah Lechuga ([id number]) on 09/24/2024 11:46:12 AM   PPM Interrogation-  reviewed in detail today,  See PACEART report.  Device History: Medtronic Dual Chamber PPM implanted 03/2020 for Second Degree AV block  Risk Assessment/Calculations:    CHA2DS2-VASc Score = 5   This indicates a 7.2% annual risk of stroke. The patient's score is based upon: CHF History: 0 HTN History: 1 Diabetes History: 0 Stroke History: 0 Vascular Disease History: 1 Age Score: 2 Gender Score: 1            Physical  Exam:   VS:  BP 116/77 (BP Location: Left Arm, Patient Position: Sitting, Cuff Size: Large)   Pulse 81   Ht 5' 2 (1.575 m)   Wt 216 lb 4.8 oz (98.1 kg)   SpO2 95%   BMI 39.56 kg/m    Wt Readings from Last 3 Encounters:  09/24/24 216 lb 4.8 oz (98.1 kg)  06/13/24 212 lb 12.8 oz  (96.5 kg)  11/08/23 209 lb 3.2 oz (94.9 kg)     GEN: Well nourished, well developed in no acute distress NECK: No JVD; No carotid bruits CARDIAC: Regular rate and rhythm, no murmurs, rubs, gallops RESPIRATORY:  Clear to auscultation without rales, wheezing or rhonchi  ABDOMEN: Soft, non-tender, non-distended EXTREMITIES:  No edema; No deformity   ASSESSMENT AND PLAN:    Second Degree AV block s/p Medtronic PPM  Normal PPM function See Pace Art report No changes today  2.  Paroxysmal atrial fibrillation: Minimal episodes  3.  Secondary hypercoagulable state: On Eliquis   4.  Coronary artery disease: Post CABG. she is having chest discomfort.  She is also having shortness of breath.  This is similar to episodes of chest pain and shortness of breath that she had when she saw her primary cardiologist in October.  Echo at that time was without abnormality.  Maizee Reinhold discuss further with her primary cardiologist.  5.  Chronic diastolic heart failure: On medical therapy per primary cardiology  6.  Mitral regurgitation: Mild by echo December 2024  7.  Hypertension:well controlled  Disposition:   Follow up with EP Team in 12 months  Signed, Joshia Kitchings Gladis Norton, MD  "

## 2024-09-24 ENCOUNTER — Encounter: Payer: Self-pay | Admitting: Cardiology

## 2024-09-24 ENCOUNTER — Ambulatory Visit: Admitting: Cardiology

## 2024-09-24 VITALS — BP 116/77 | HR 81 | Ht 62.0 in | Wt 216.3 lb

## 2024-09-24 DIAGNOSIS — I5032 Chronic diastolic (congestive) heart failure: Secondary | ICD-10-CM | POA: Diagnosis not present

## 2024-09-24 DIAGNOSIS — I4819 Other persistent atrial fibrillation: Secondary | ICD-10-CM

## 2024-09-24 DIAGNOSIS — I441 Atrioventricular block, second degree: Secondary | ICD-10-CM

## 2024-09-24 DIAGNOSIS — I10 Essential (primary) hypertension: Secondary | ICD-10-CM | POA: Diagnosis not present

## 2024-09-24 DIAGNOSIS — I2510 Atherosclerotic heart disease of native coronary artery without angina pectoris: Secondary | ICD-10-CM | POA: Diagnosis not present

## 2024-09-24 DIAGNOSIS — D6869 Other thrombophilia: Secondary | ICD-10-CM | POA: Diagnosis not present

## 2024-09-24 DIAGNOSIS — G4733 Obstructive sleep apnea (adult) (pediatric): Secondary | ICD-10-CM | POA: Diagnosis not present

## 2024-09-24 LAB — CUP PACEART INCLINIC DEVICE CHECK
Date Time Interrogation Session: 20260114200607
Implantable Lead Connection Status: 753985
Implantable Lead Connection Status: 753985
Implantable Lead Implant Date: 20210707
Implantable Lead Implant Date: 20210707
Implantable Lead Location: 753859
Implantable Lead Location: 753860
Implantable Lead Model: 5076
Implantable Lead Model: 5076
Implantable Lead Serial Number: 8264443
Implantable Pulse Generator Implant Date: 20210707

## 2024-09-24 NOTE — Patient Instructions (Signed)
 Medication Instructions:  Your physician recommends that you continue on your current medications as directed. Please refer to the Current Medication list given to you today.  *If you need a refill on your cardiac medications before your next appointment, please call your pharmacy*  Lab Work: None ordered  Testing/Procedures: None ordered  Follow-Up: At [facility], you and your health needs are our priority.  As part of our continuing mission to provide you with exceptional heart care, our providers are all part of one team.  This team includes your primary Cardiologist (physician) and Advanced Practice Providers or APPs (Physician Assistants and Nurse Practitioners) who all work together to provide you with the care you need, when you need it.  Your next appointment:   1 year(s)  Provider:   You will see one of the following Advanced Practice Providers on your designated Care Team:   Charlies Arthur, PA-C Michael Andy Tillery, PA-C Suzann Riddle, NP Daphne Barrack, NP Artist Pouch, PA-C    Thank you for choosing [facility]!!   Maeola Domino, RN [phone]

## 2024-09-26 ENCOUNTER — Ambulatory Visit: Payer: Self-pay | Admitting: Cardiology

## 2024-10-01 ENCOUNTER — Other Ambulatory Visit: Payer: Self-pay | Admitting: Emergency Medicine

## 2024-10-02 ENCOUNTER — Encounter: Payer: Self-pay | Admitting: Cardiovascular Disease

## 2024-10-02 ENCOUNTER — Other Ambulatory Visit: Payer: Self-pay

## 2024-10-02 MED ORDER — SACUBITRIL-VALSARTAN 49-51 MG PO TABS: 1.0000 | ORAL_TABLET | Freq: Two times a day (BID) | ORAL | 2 refills | Status: AC

## 2024-10-08 NOTE — Telephone Encounter (Signed)
 Labs on 07/28/24 Outside of Normal  In accordance with refill protocols, please review and address the following requirements before this medication refill can be authorized:  Labs

## 2025-02-13 ENCOUNTER — Encounter (INDEPENDENT_AMBULATORY_CARE_PROVIDER_SITE_OTHER): Admitting: Ophthalmology
